# Patient Record
Sex: Female | Born: 1950
Health system: Southern US, Community
[De-identification: ages and names within clinical notes are randomized; demographics above are authoritative.]

## PROBLEM LIST (undated history)

## (undated) DIAGNOSIS — F418 Other specified anxiety disorders: Secondary | ICD-10-CM

## (undated) DIAGNOSIS — I10 Essential (primary) hypertension: Secondary | ICD-10-CM

## (undated) DIAGNOSIS — J449 Chronic obstructive pulmonary disease, unspecified: Secondary | ICD-10-CM

## (undated) DIAGNOSIS — K5792 Diverticulitis of intestine, part unspecified, without perforation or abscess without bleeding: Secondary | ICD-10-CM

## (undated) DIAGNOSIS — I2510 Atherosclerotic heart disease of native coronary artery without angina pectoris: Secondary | ICD-10-CM

## (undated) DIAGNOSIS — I219 Acute myocardial infarction, unspecified: Secondary | ICD-10-CM

## (undated) DIAGNOSIS — Z8679 Personal history of other diseases of the circulatory system: Secondary | ICD-10-CM

## (undated) DIAGNOSIS — K523 Indeterminate colitis: Secondary | ICD-10-CM

## (undated) DIAGNOSIS — K529 Noninfective gastroenteritis and colitis, unspecified: Secondary | ICD-10-CM

## (undated) DIAGNOSIS — F32A Depression, unspecified: Secondary | ICD-10-CM

## (undated) DIAGNOSIS — E785 Hyperlipidemia, unspecified: Secondary | ICD-10-CM

## (undated) DIAGNOSIS — F329 Major depressive disorder, single episode, unspecified: Secondary | ICD-10-CM

## (undated) DIAGNOSIS — I739 Peripheral vascular disease, unspecified: Secondary | ICD-10-CM

## (undated) DIAGNOSIS — E119 Type 2 diabetes mellitus without complications: Secondary | ICD-10-CM

## (undated) HISTORY — DX: Personal history of other diseases of the circulatory system: Z86.79

## (undated) HISTORY — DX: Indeterminate colitis: K52.3

## (undated) HISTORY — DX: Depression, unspecified: F32.A

## (undated) HISTORY — PX: ABDOMINAL HYSTERECTOMY: SHX81

## (undated) HISTORY — DX: Other specified anxiety disorders: F41.8

## (undated) HISTORY — DX: Essential (primary) hypertension: I10

## (undated) HISTORY — DX: Type 2 diabetes mellitus without complications: E11.9

## (undated) HISTORY — DX: Diverticulitis of intestine, part unspecified, without perforation or abscess without bleeding: K57.92

## (undated) HISTORY — DX: Hyperlipidemia, unspecified: E78.5

## (undated) HISTORY — DX: Peripheral vascular disease, unspecified: I73.9

## (undated) HISTORY — DX: Major depressive disorder, single episode, unspecified: F32.9

## (undated) HISTORY — DX: Chronic obstructive pulmonary disease, unspecified: J44.9

## (undated) HISTORY — DX: Noninfective gastroenteritis and colitis, unspecified: K52.9

## (undated) HISTORY — DX: Atherosclerotic heart disease of native coronary artery without angina pectoris: I25.10

## (undated) HISTORY — DX: Acute myocardial infarction, unspecified: I21.9

---

## 1998-04-15 ENCOUNTER — Emergency Department (HOSPITAL_COMMUNITY): Admission: EM | Admit: 1998-04-15 | Discharge: 1998-04-15 | Payer: Self-pay

## 1998-06-16 HISTORY — PX: OTHER SURGICAL HISTORY: SHX169

## 1998-12-31 ENCOUNTER — Inpatient Hospital Stay (HOSPITAL_COMMUNITY): Admission: EM | Admit: 1998-12-31 | Discharge: 1999-01-03 | Payer: Self-pay | Admitting: Emergency Medicine

## 2000-02-05 ENCOUNTER — Encounter: Admission: RE | Admit: 2000-02-05 | Discharge: 2000-02-05 | Payer: Self-pay | Admitting: General Surgery

## 2000-02-05 ENCOUNTER — Encounter: Payer: Self-pay | Admitting: General Surgery

## 2000-02-12 ENCOUNTER — Encounter: Payer: Self-pay | Admitting: General Surgery

## 2000-02-12 ENCOUNTER — Encounter: Admission: RE | Admit: 2000-02-12 | Discharge: 2000-02-12 | Payer: Self-pay | Admitting: General Surgery

## 2001-02-17 ENCOUNTER — Encounter: Admission: RE | Admit: 2001-02-17 | Discharge: 2001-02-17 | Payer: Self-pay | Admitting: General Surgery

## 2001-02-17 ENCOUNTER — Encounter: Payer: Self-pay | Admitting: General Surgery

## 2006-05-20 ENCOUNTER — Ambulatory Visit: Payer: Self-pay | Admitting: Internal Medicine

## 2006-06-16 HISTORY — PX: OTHER SURGICAL HISTORY: SHX169

## 2006-07-01 ENCOUNTER — Ambulatory Visit: Payer: Self-pay | Admitting: Internal Medicine

## 2006-07-01 LAB — CONVERTED CEMR LAB
ALT: 17 units/L (ref 0–40)
Alkaline Phosphatase: 92 units/L (ref 39–117)
BUN: 14 mg/dL (ref 6–23)
Basophils Absolute: 0 10*3/uL (ref 0.0–0.1)
Basophils Relative: 0.5 % (ref 0.0–1.0)
Chloride: 106 meq/L (ref 96–112)
Direct LDL: 92.6 mg/dL
Eosinophils Relative: 4.6 % (ref 0.0–5.0)
GFR calc Af Amer: 84 mL/min
GFR calc non Af Amer: 69 mL/min
MCV: 90.1 fL (ref 78.0–100.0)
Microalb, Ur: 5.7 mg/dL — ABNORMAL HIGH (ref 0.0–1.9)
Platelets: 177 10*3/uL (ref 150–400)
RBC: 5.06 M/uL (ref 3.87–5.11)
RDW: 13.9 % (ref 11.5–14.6)
Total Bilirubin: 1 mg/dL (ref 0.3–1.2)
Total CHOL/HDL Ratio: 5.5
WBC: 7.2 10*3/uL (ref 4.5–10.5)

## 2006-07-08 ENCOUNTER — Ambulatory Visit: Payer: Self-pay | Admitting: Internal Medicine

## 2006-11-06 ENCOUNTER — Ambulatory Visit: Payer: Self-pay | Admitting: Internal Medicine

## 2006-11-06 LAB — CONVERTED CEMR LAB
ALT: 21 units/L (ref 0–40)
AST: 23 units/L (ref 0–37)
Calcium, Total (PTH): 10.2 mg/dL (ref 8.4–10.5)
Cholesterol: 152 mg/dL (ref 0–200)
Hgb A1c MFr Bld: 6.1 % — ABNORMAL HIGH (ref 4.6–6.0)
Total CHOL/HDL Ratio: 6.4
Triglycerides: 226 mg/dL (ref 0–149)
VLDL: 45 mg/dL — ABNORMAL HIGH (ref 0–40)

## 2006-11-13 ENCOUNTER — Ambulatory Visit: Payer: Self-pay | Admitting: Internal Medicine

## 2006-12-03 ENCOUNTER — Encounter: Payer: Self-pay | Admitting: Internal Medicine

## 2006-12-03 DIAGNOSIS — I10 Essential (primary) hypertension: Secondary | ICD-10-CM | POA: Insufficient documentation

## 2006-12-03 DIAGNOSIS — E1169 Type 2 diabetes mellitus with other specified complication: Secondary | ICD-10-CM

## 2006-12-03 DIAGNOSIS — E785 Hyperlipidemia, unspecified: Secondary | ICD-10-CM

## 2006-12-03 DIAGNOSIS — E119 Type 2 diabetes mellitus without complications: Secondary | ICD-10-CM

## 2006-12-03 DIAGNOSIS — I2510 Atherosclerotic heart disease of native coronary artery without angina pectoris: Secondary | ICD-10-CM

## 2006-12-03 DIAGNOSIS — F17200 Nicotine dependence, unspecified, uncomplicated: Secondary | ICD-10-CM | POA: Insufficient documentation

## 2006-12-25 ENCOUNTER — Ambulatory Visit: Payer: Self-pay | Admitting: Internal Medicine

## 2006-12-25 LAB — CONVERTED CEMR LAB
ALT: 35 units/L (ref 0–35)
Chloride: 107 meq/L (ref 96–112)
Cholesterol: 165 mg/dL (ref 0–200)
GFR calc Af Amer: 84 mL/min
Glucose, Bld: 84 mg/dL (ref 70–99)
LDL Cholesterol: 101 mg/dL — ABNORMAL HIGH (ref 0–99)
Potassium: 4.3 meq/L (ref 3.5–5.1)
Sodium: 144 meq/L (ref 135–145)

## 2007-01-03 DIAGNOSIS — I739 Peripheral vascular disease, unspecified: Secondary | ICD-10-CM

## 2007-01-04 ENCOUNTER — Ambulatory Visit: Payer: Self-pay | Admitting: Internal Medicine

## 2007-01-08 ENCOUNTER — Encounter: Payer: Self-pay | Admitting: Internal Medicine

## 2007-01-08 ENCOUNTER — Ambulatory Visit: Payer: Self-pay

## 2007-01-15 ENCOUNTER — Encounter: Payer: Self-pay | Admitting: Internal Medicine

## 2007-02-17 ENCOUNTER — Ambulatory Visit: Payer: Self-pay | Admitting: Vascular Surgery

## 2007-02-26 ENCOUNTER — Ambulatory Visit (HOSPITAL_COMMUNITY): Admission: RE | Admit: 2007-02-26 | Discharge: 2007-02-26 | Payer: Self-pay | Admitting: Vascular Surgery

## 2007-02-26 ENCOUNTER — Ambulatory Visit: Payer: Self-pay | Admitting: Vascular Surgery

## 2007-03-19 ENCOUNTER — Ambulatory Visit: Payer: Self-pay | Admitting: Vascular Surgery

## 2007-03-19 ENCOUNTER — Encounter: Admission: RE | Admit: 2007-03-19 | Discharge: 2007-03-19 | Payer: Self-pay | Admitting: Vascular Surgery

## 2007-03-24 ENCOUNTER — Ambulatory Visit: Payer: Self-pay | Admitting: Internal Medicine

## 2007-03-24 DIAGNOSIS — M810 Age-related osteoporosis without current pathological fracture: Secondary | ICD-10-CM | POA: Insufficient documentation

## 2007-03-24 LAB — CONVERTED CEMR LAB
Cholesterol: 159 mg/dL (ref 0–200)
Total CHOL/HDL Ratio: 6

## 2007-03-31 ENCOUNTER — Ambulatory Visit: Payer: Self-pay | Admitting: Cardiovascular Disease

## 2007-03-31 ENCOUNTER — Ambulatory Visit: Payer: Self-pay | Admitting: Internal Medicine

## 2007-03-31 DIAGNOSIS — E559 Vitamin D deficiency, unspecified: Secondary | ICD-10-CM | POA: Insufficient documentation

## 2007-04-06 ENCOUNTER — Ambulatory Visit: Payer: Self-pay

## 2007-05-03 ENCOUNTER — Inpatient Hospital Stay (HOSPITAL_COMMUNITY): Admission: RE | Admit: 2007-05-03 | Discharge: 2007-05-07 | Payer: Self-pay | Admitting: Vascular Surgery

## 2007-05-03 ENCOUNTER — Ambulatory Visit: Payer: Self-pay | Admitting: Vascular Surgery

## 2007-05-07 ENCOUNTER — Encounter: Payer: Self-pay | Admitting: Internal Medicine

## 2007-05-12 ENCOUNTER — Telehealth: Payer: Self-pay | Admitting: Internal Medicine

## 2007-05-28 ENCOUNTER — Ambulatory Visit: Payer: Self-pay | Admitting: Vascular Surgery

## 2007-05-28 ENCOUNTER — Encounter: Payer: Self-pay | Admitting: Internal Medicine

## 2007-06-02 ENCOUNTER — Ambulatory Visit: Payer: Self-pay | Admitting: Internal Medicine

## 2007-06-06 LAB — CONVERTED CEMR LAB
LDL Cholesterol: 88 mg/dL (ref 0–99)
Triglycerides: 144 mg/dL (ref 0–149)
VLDL: 29 mg/dL (ref 0–40)

## 2007-06-08 ENCOUNTER — Ambulatory Visit: Payer: Self-pay | Admitting: Internal Medicine

## 2007-06-08 LAB — CONVERTED CEMR LAB: Blood Glucose, Fingerstick: 123

## 2007-08-02 ENCOUNTER — Telehealth: Payer: Self-pay | Admitting: Internal Medicine

## 2007-09-09 ENCOUNTER — Ambulatory Visit: Payer: Self-pay | Admitting: Internal Medicine

## 2007-09-12 LAB — CONVERTED CEMR LAB
ALT: 26 units/L (ref 0–35)
AST: 28 units/L (ref 0–37)
Cholesterol: 141 mg/dL (ref 0–200)
Hgb A1c MFr Bld: 5.8 % (ref 4.6–6.0)
LDL Cholesterol: 92 mg/dL (ref 0–99)
Triglycerides: 136 mg/dL (ref 0–149)
VLDL: 27 mg/dL (ref 0–40)

## 2007-09-16 ENCOUNTER — Ambulatory Visit: Payer: Self-pay | Admitting: Internal Medicine

## 2007-10-08 ENCOUNTER — Ambulatory Visit: Payer: Self-pay | Admitting: Vascular Surgery

## 2007-10-08 ENCOUNTER — Telehealth: Payer: Self-pay | Admitting: *Deleted

## 2007-11-16 ENCOUNTER — Ambulatory Visit: Payer: Self-pay | Admitting: Vascular Surgery

## 2007-12-01 ENCOUNTER — Encounter: Payer: Self-pay | Admitting: Internal Medicine

## 2007-12-06 ENCOUNTER — Telehealth: Payer: Self-pay | Admitting: *Deleted

## 2007-12-21 ENCOUNTER — Telehealth: Payer: Self-pay | Admitting: *Deleted

## 2008-01-10 ENCOUNTER — Ambulatory Visit: Payer: Self-pay | Admitting: Internal Medicine

## 2008-01-10 LAB — CONVERTED CEMR LAB
Hgb A1c MFr Bld: 5.8 % (ref 4.6–6.0)
Microalb, Ur: 0.2 mg/dL (ref 0.0–1.9)

## 2008-01-17 ENCOUNTER — Ambulatory Visit: Payer: Self-pay | Admitting: Internal Medicine

## 2008-01-26 ENCOUNTER — Telehealth: Payer: Self-pay | Admitting: Family Medicine

## 2008-01-31 ENCOUNTER — Ambulatory Visit: Payer: Self-pay | Admitting: Internal Medicine

## 2008-01-31 LAB — CONVERTED CEMR LAB
Basophils Absolute: 0.1 10*3/uL (ref 0.0–0.1)
Basophils Relative: 1.8 % (ref 0.0–3.0)
HCT: 39.4 % (ref 36.0–46.0)
Hemoglobin: 13.6 g/dL (ref 12.0–15.0)
Platelets: 205 10*3/uL (ref 150–400)
RDW: 13.6 % (ref 11.5–14.6)
WBC: 7.3 10*3/uL (ref 4.5–10.5)

## 2008-02-04 ENCOUNTER — Telehealth: Payer: Self-pay | Admitting: Internal Medicine

## 2008-02-25 ENCOUNTER — Ambulatory Visit: Payer: Self-pay | Admitting: Internal Medicine

## 2008-03-03 ENCOUNTER — Ambulatory Visit: Payer: Self-pay | Admitting: Internal Medicine

## 2008-03-06 LAB — CONVERTED CEMR LAB
OCCULT 1: NEGATIVE
OCCULT 2: NEGATIVE

## 2008-03-22 ENCOUNTER — Ambulatory Visit: Payer: Self-pay | Admitting: Internal Medicine

## 2008-07-20 ENCOUNTER — Ambulatory Visit: Payer: Self-pay | Admitting: Internal Medicine

## 2008-07-20 LAB — CONVERTED CEMR LAB
ALT: 30 units/L (ref 0–35)
AST: 32 units/L (ref 0–37)
Alkaline Phosphatase: 58 units/L (ref 39–117)
Hgb A1c MFr Bld: 6 % (ref 4.6–6.0)
Total CHOL/HDL Ratio: 6.7
Total Protein: 7.7 g/dL (ref 6.0–8.3)
Triglycerides: 169 mg/dL — ABNORMAL HIGH (ref 0–149)
VLDL: 34 mg/dL (ref 0–40)

## 2008-07-28 ENCOUNTER — Ambulatory Visit: Payer: Self-pay | Admitting: Internal Medicine

## 2008-07-28 DIAGNOSIS — R011 Cardiac murmur, unspecified: Secondary | ICD-10-CM

## 2008-07-28 LAB — CONVERTED CEMR LAB: Hemoglobin: 14.3 g/dL

## 2008-08-07 ENCOUNTER — Encounter: Payer: Self-pay | Admitting: Internal Medicine

## 2008-08-07 ENCOUNTER — Ambulatory Visit: Payer: Self-pay

## 2008-08-23 ENCOUNTER — Ambulatory Visit: Payer: Self-pay | Admitting: Cardiovascular Disease

## 2008-09-06 ENCOUNTER — Ambulatory Visit: Payer: Self-pay

## 2008-09-06 ENCOUNTER — Encounter: Payer: Self-pay | Admitting: Cardiovascular Disease

## 2008-09-12 ENCOUNTER — Telehealth: Payer: Self-pay | Admitting: *Deleted

## 2008-10-02 ENCOUNTER — Telehealth: Payer: Self-pay | Admitting: *Deleted

## 2008-10-12 ENCOUNTER — Telehealth: Payer: Self-pay | Admitting: Internal Medicine

## 2008-11-30 ENCOUNTER — Encounter: Payer: Self-pay | Admitting: Internal Medicine

## 2008-12-07 ENCOUNTER — Telehealth: Payer: Self-pay | Admitting: Internal Medicine

## 2008-12-07 ENCOUNTER — Emergency Department (HOSPITAL_COMMUNITY): Admission: EM | Admit: 2008-12-07 | Discharge: 2008-12-07 | Payer: Self-pay | Admitting: Emergency Medicine

## 2008-12-08 ENCOUNTER — Telehealth: Payer: Self-pay | Admitting: Internal Medicine

## 2008-12-20 ENCOUNTER — Ambulatory Visit: Payer: Self-pay | Admitting: Vascular Surgery

## 2008-12-26 ENCOUNTER — Encounter: Payer: Self-pay | Admitting: *Deleted

## 2009-01-18 ENCOUNTER — Ambulatory Visit: Payer: Self-pay | Admitting: Internal Medicine

## 2009-01-18 LAB — CONVERTED CEMR LAB
Glucose, Bld: 119 mg/dL — ABNORMAL HIGH (ref 70–99)
Potassium: 3.8 meq/L (ref 3.5–5.1)

## 2009-01-30 ENCOUNTER — Ambulatory Visit: Payer: Self-pay | Admitting: Internal Medicine

## 2009-01-30 LAB — CONVERTED CEMR LAB
Free T4: 1 ng/dL (ref 0.6–1.6)
T3, Free: 3.2 pg/mL (ref 2.3–4.2)

## 2009-02-14 LAB — CONVERTED CEMR LAB
Calcium, Total (PTH): 10.7 mg/dL — ABNORMAL HIGH (ref 8.4–10.5)
PTH: 30.2 pg/mL (ref 14.0–72.0)
Thyroperoxidase Ab SerPl-aCnc: 59.9 (ref 0.0–60.0)

## 2009-02-15 ENCOUNTER — Ambulatory Visit: Payer: Self-pay | Admitting: Internal Medicine

## 2009-02-20 ENCOUNTER — Encounter: Payer: Self-pay | Admitting: Internal Medicine

## 2009-02-20 LAB — CONVERTED CEMR LAB: Phosphorus, Ur: 33.5 mg/dL

## 2009-03-02 LAB — CONVERTED CEMR LAB: Calcium Ionized: 1.43 mmol/L — ABNORMAL HIGH (ref 1.12–1.32)

## 2009-03-15 ENCOUNTER — Ambulatory Visit: Payer: Self-pay | Admitting: Endocrinology

## 2009-03-23 ENCOUNTER — Ambulatory Visit: Payer: Self-pay | Admitting: Endocrinology

## 2009-03-23 ENCOUNTER — Ambulatory Visit: Payer: Self-pay | Admitting: Internal Medicine

## 2009-04-25 ENCOUNTER — Ambulatory Visit: Payer: Self-pay | Admitting: Internal Medicine

## 2009-05-25 ENCOUNTER — Ambulatory Visit: Payer: Self-pay | Admitting: Vascular Surgery

## 2009-07-18 ENCOUNTER — Ambulatory Visit: Payer: Self-pay | Admitting: Internal Medicine

## 2009-07-18 LAB — CONVERTED CEMR LAB
ALT: 39 units/L — ABNORMAL HIGH (ref 0–35)
Bilirubin, Direct: 0.1 mg/dL (ref 0.0–0.3)
Cholesterol: 151 mg/dL (ref 0–200)
LDL Cholesterol: 94 mg/dL (ref 0–99)
Total Bilirubin: 0.5 mg/dL (ref 0.3–1.2)
Total CHOL/HDL Ratio: 5
Triglycerides: 125 mg/dL (ref 0.0–149.0)
VLDL: 25 mg/dL (ref 0.0–40.0)

## 2009-07-24 ENCOUNTER — Ambulatory Visit: Payer: Self-pay | Admitting: Internal Medicine

## 2009-07-24 DIAGNOSIS — R7401 Elevation of levels of liver transaminase levels: Secondary | ICD-10-CM | POA: Insufficient documentation

## 2009-07-24 DIAGNOSIS — R740 Nonspecific elevation of levels of transaminase and lactic acid dehydrogenase [LDH]: Secondary | ICD-10-CM

## 2009-07-24 LAB — CONVERTED CEMR LAB
Hep B Core Total Ab: NEGATIVE
Hep B S Ab: NEGATIVE

## 2009-07-31 ENCOUNTER — Telehealth: Payer: Self-pay | Admitting: *Deleted

## 2009-07-31 LAB — CONVERTED CEMR LAB
AST: 29 units/L (ref 0–37)
Albumin: 4.2 g/dL (ref 3.5–5.2)
Alkaline Phosphatase: 66 units/L (ref 39–117)
Bilirubin, Direct: 0.2 mg/dL (ref 0.0–0.3)
Calcium: 10.4 mg/dL (ref 8.4–10.5)
Free T4: 1 ng/dL (ref 0.6–1.6)
Iron: 100 ug/dL (ref 42–145)
Saturation Ratios: 19 % — ABNORMAL LOW (ref 20.0–50.0)
T3, Free: 3 pg/mL (ref 2.3–4.2)
Total Bilirubin: 0.6 mg/dL (ref 0.3–1.2)
Total Protein: 7.8 g/dL (ref 6.0–8.3)

## 2009-08-23 ENCOUNTER — Ambulatory Visit: Payer: Self-pay | Admitting: Internal Medicine

## 2009-08-27 ENCOUNTER — Telehealth: Payer: Self-pay | Admitting: Endocrinology

## 2009-08-27 ENCOUNTER — Encounter: Payer: Self-pay | Admitting: Endocrinology

## 2009-08-27 ENCOUNTER — Ambulatory Visit: Payer: Self-pay | Admitting: Internal Medicine

## 2009-09-10 ENCOUNTER — Ambulatory Visit: Payer: Self-pay | Admitting: Endocrinology

## 2009-10-18 ENCOUNTER — Ambulatory Visit: Payer: Self-pay | Admitting: Internal Medicine

## 2009-10-18 LAB — CONVERTED CEMR LAB: Hgb A1c MFr Bld: 5.9 % (ref 4.6–6.5)

## 2009-10-25 ENCOUNTER — Ambulatory Visit: Payer: Self-pay | Admitting: Internal Medicine

## 2009-12-07 ENCOUNTER — Encounter: Payer: Self-pay | Admitting: Internal Medicine

## 2009-12-11 ENCOUNTER — Encounter: Payer: Self-pay | Admitting: *Deleted

## 2009-12-20 ENCOUNTER — Ambulatory Visit: Payer: Self-pay | Admitting: Vascular Surgery

## 2010-03-05 ENCOUNTER — Ambulatory Visit: Payer: Self-pay | Admitting: Internal Medicine

## 2010-03-05 LAB — CONVERTED CEMR LAB
ALT: 32 units/L (ref 0–35)
Albumin: 4.1 g/dL (ref 3.5–5.2)
Basophils Absolute: 0 10*3/uL (ref 0.0–0.1)
Blood in Urine, dipstick: NEGATIVE
Eosinophils Absolute: 0.3 10*3/uL (ref 0.0–0.7)
Glucose, Urine, Semiquant: NEGATIVE
HCT: 39.8 % (ref 36.0–46.0)
HDL: 27.6 mg/dL — ABNORMAL LOW (ref >39.00)
LDL Cholesterol: 87 mg/dL (ref 0–99)
MCHC: 34.1 g/dL (ref 30.0–36.0)
MCV: 95.1 fL (ref 78.0–100.0)
Microalb Creat Ratio: 0.4 mg/g (ref 0.0–30.0)
Microalb, Ur: 0.1 mg/dL (ref 0.0–1.9)
Monocytes Absolute: 0.4 10*3/uL (ref 0.1–1.0)
Neutro Abs: 3.5 10*3/uL (ref 1.4–7.7)
Neutrophils Relative %: 60 % (ref 43.0–77.0)
Nitrite: NEGATIVE
Platelets: 163 10*3/uL (ref 150.0–400.0)
Potassium: 5.7 meq/L — ABNORMAL HIGH (ref 3.5–5.1)
RDW: 13.8 % (ref 11.5–14.6)
Sodium: 145 meq/L (ref 135–145)
TSH: 2.6 microintl units/mL (ref 0.35–5.50)
Total Bilirubin: 0.6 mg/dL (ref 0.3–1.2)
Total Protein: 6.9 g/dL (ref 6.0–8.3)
Triglycerides: 102 mg/dL (ref 0.0–149.0)
Urobilinogen, UA: 0.2
VLDL: 20.4 mg/dL (ref 0.0–40.0)
WBC: 5.9 10*3/uL (ref 4.5–10.5)

## 2010-03-12 ENCOUNTER — Ambulatory Visit: Payer: Self-pay | Admitting: Internal Medicine

## 2010-03-12 ENCOUNTER — Encounter: Payer: Self-pay | Admitting: Internal Medicine

## 2010-03-12 DIAGNOSIS — E875 Hyperkalemia: Secondary | ICD-10-CM | POA: Insufficient documentation

## 2010-07-02 ENCOUNTER — Ambulatory Visit
Admission: RE | Admit: 2010-07-02 | Discharge: 2010-07-02 | Payer: Self-pay | Source: Home / Self Care | Attending: Internal Medicine | Admitting: Internal Medicine

## 2010-07-02 ENCOUNTER — Other Ambulatory Visit: Payer: Self-pay | Admitting: Internal Medicine

## 2010-07-02 LAB — HEMOGLOBIN A1C: Hgb A1c MFr Bld: 6.4 % (ref 4.6–6.5)

## 2010-07-12 ENCOUNTER — Ambulatory Visit
Admission: RE | Admit: 2010-07-12 | Discharge: 2010-07-12 | Payer: Self-pay | Source: Home / Self Care | Attending: Internal Medicine | Admitting: Internal Medicine

## 2010-07-12 DIAGNOSIS — H409 Unspecified glaucoma: Secondary | ICD-10-CM | POA: Insufficient documentation

## 2010-07-12 LAB — HM DIABETES FOOT EXAM

## 2010-07-18 NOTE — Assessment & Plan Note (Signed)
Summary: follow up/ssc   Vital Signs:  Patient profile:   60 year old female Menstrual status:  hysterectomy Weight:      155 pounds Pulse rate:   72 / minute BP sitting:   120 / 80  (left arm) Cuff size:   regular  Vitals Entered By: Romualdo Bolk, CMA (AAMA) (July 24, 2009 8:22 AM) CC: Follow-up visit on labs- Pt is having problems with loosing temper over things and gets in the mood where she wants to cry alot but doesn't. This has been going on since Mid Nov., Hypertension Management   History of Present Illness: Kimberly [name] comes   for follow up of labs.   No change in health status as far as  CV pulm symptom and dm.  However for the last few months she has had anedoneia just staying at home without energy or wanting to so the things she normally would like such as playing with grandkids taking a walk etc.  She has tried exrecise getting enough sleep and going through the motions but still feels down and  deprssed feeling. No panic and no specific triggers.  No fam hx of depression.  NO fever weight change or med change.  Hypertension History:      She denies headache, chest pain, palpitations, dyspnea with exertion, orthopnea, PND, peripheral edema, visual symptoms, neurologic problems, syncope, and side effects from treatment.  She notes no problems with any antihypertensive medication side effects.        Positive major cardiovascular risk factors include female age 68 years old or older, diabetes, hyperlipidemia, hypertension, and current tobacco user.        Positive history for target organ damage include ASHD (either angina/prior MI/prior CABG) and peripheral vascular disease.  Further assessment for target organ damage reveals no history of stroke/TIA.      Preventive Screening-Counseling & Management  Alcohol-Tobacco     Alcohol drinks/day: 0     Smoking Status: current     Smoking Cessation Counseling: yes     Packs/Day: <0.25  Caffeine-Diet-Exercise  Caffeine use/day: 1     Does Patient Exercise: yes  Current Medications (verified): 1)  Albuterol 90 Mcg/act Aers (Albuterol) 2)  Bayer Aspirin 325 Mg Tabs (Aspirin) .... Take 1 Tablet By Mouth Once A Day 3)  Fenofibrate Micronized 200 Mg Caps (Fenofibrate Micronized) .Marland Kitchen.. 1 By Mouth Once Daily 4)  Lipitor 80 Mg  Tabs (Atorvastatin Calcium) .... Once Daily 5)  Lisinopril 20 Mg Tabs (Lisinopril) .... Take 1 Tablet By Mouth Once A Day 6)  Toprol Xl 50 Mg Tb24 (Metoprolol Succinate) .... Take 1 Tablet By Mouth Once A Day 7)  Fish Oil   Oil (Fish Oil) 8)  Vitamin D 400 Unit  Tabs (Cholecalciferol)  Allergies (verified): No Known Drug Allergies  Past History:  Past medical, surgical, family and social histories (including risk factors) reviewed, and no changes noted (except as noted below).  Past Medical History: Reviewed history from 01/30/2009 and no changes required. Coronary artery disease Diabetes mellitus, type II Hyperlipidemia Hypertension PVD stress test? 2008  pre bypass surgery. ED visit June 2010  rectal bleeding felt to be hemorrhoid    Echo  Consults Dr. Arbie Cookey Dr. Eden Emms  Past Surgical History: Reviewed history from 01/31/2008 and no changes required. cabg Hysterectomy endometriosis aortofemoral bypass grafting November 2008  Past History:  Care Management: Cardiology: Eden Emms Vascular Surgery: Early Endocrinology: Everardo All  Family History: Reviewed history from 03/15/2009 and no changes required. Family  History Diabetes 1st degree relative Family History Hypertension CAD Thyroid  sisters sister had parathyroidectomy Brother died alcoholic cirrhosis     Social History: Reviewed history from 01/30/2009 and no changes required. Married Current Smoker    9 per day  Alcohol use-no Regular exercise-yes Household of 2 see data base      Review of Systems       The patient complains of depression.  The patient denies anorexia, fever, weight loss,  weight gain, vision loss, decreased hearing, hoarseness, syncope, dyspnea on exertion, peripheral edema, prolonged cough, headaches, abdominal pain, melena, hematochezia, severe indigestion/heartburn, hematuria, muscle weakness, difficulty walking, unusual weight change, enlarged lymph nodes, angioedema, and breast masses.         no suicidal thoughts .  Physical Exam  General:  Well-developed,well-nourished,in no acute distress; alert,appropriate and cooperative throughout examination Head:  normocephalic and atraumatic.   Eyes:  vision grossly intact, pupils equal, and pupils round.   Neck:  palpable thyroid no nodeules  Lungs:  Normal respiratory effort, chest expands symmetrically. Lungs are clear to auscultation, no crackles or wheezes. Heart:  Normal rate and regular rhythm. S1 and S2 normal without gallop, murmur, click, rub or other extra sounds. Abdomen:  Bowel sounds positive,abdomen soft and non-tender without masses, organomegaly or   noted. Extremities:  no clubbing cyanosis or edema  Neurologic:  alert & oriented X3, strength normal in all extremities, sensation intact to light touch, and gait normal.   Skin:  turgor normal, color normal, no ecchymoses, and no petechiae.   Cervical Nodes:  No lymphadenopathy noted Psych:  Oriented X3, good eye contact, not anxious appearing, not depressed appearing, and not agitated.     Impression & Recommendations:  Problem # 1:  TRANSAMINASES, SERUM, ELEVATED (ICD-790.4) Assessment New neg fam hx except for bro died of alcoholic cirrhosis   need follow up testing and monitoring. Orders: TLB-BMP (Basic Metabolic Panel-BMET) (80048-METABOL) TLB-Hepatic/Liver Function Pnl (80076-HEPATIC) TLB-TSH (Thyroid Stimulating Hormone) (84443-TSH) TLB-T4 (Thyrox), Free [id number]) TLB-T3, Free (Triiodothyronine) (84481-T3FREE) T-Hepatitis C Antibody (46962-95284) T-Hepatitis B Surface Antibody (13244-01027) T-Hepatitis B Surface Antigen  (25366-44034) T-Hepatitis B Core Antibody (74259-56387) T-Ceruloplasmin (56433-29518) T-Antinuclear Antib (ANA) (84166-06301) TLB-IBC Pnl (Iron/FE;Transferrin) (83550-IBC)  Problem # 2:  ? of ADJUSTMENT DISORDER WITH DEPRESSED MOOD (ICD-309.0) Assessment: New seems like mild depression but never had this before and no inciting factor or lifestyle factor.    never had   seasonal changes in mood before.  and no fam hx .  r/o metabolic causes and then follow up   consider low dose ssri and follow up aftet that if labs unrevealing . disc options of rx   doesnt want to  do souncseling at this point.     Orders: TLB-BMP (Basic Metabolic Panel-BMET) (80048-METABOL) TLB-Hepatic/Liver Function Pnl (80076-HEPATIC) TLB-TSH (Thyroid Stimulating Hormone) (84443-TSH) TLB-T4 (Thyrox), Free [phone] TLB-T3, Free (Triiodothyronine) (84481-T3FREE) T-Hepatitis C Antibody (57322-02542) T-Hepatitis B Surface Antibody (70623-76283) T-Hepatitis B Surface Antigen (15176-16073) T-Hepatitis B Core Antibody (71062-69485) T-Ceruloplasmin (46270-35009) T-Antinuclear Antib (ANA) (38182-99371) TLB-IBC Pnl (Iron/FE;Transferrin) (83550-IBC)  Problem # 3:  DIABETES MELLITUS, TYPE II (ICD-250.00) due for updated pneumovax Her updated medication list for this problem includes:    Bayer Aspirin 325 Mg Tabs (Aspirin) .Marland Kitchen... Take 1 tablet by mouth once a day    Lisinopril 20 Mg Tabs (Lisinopril) .Marland Kitchen... Take 1 tablet by mouth once a day  Labs Reviewed: Creat: 0.9 (01/18/2009)     Last Eye Exam: normal (11/30/2008) Reviewed HgBA1c results: 6.3 (07/18/2009)  5.9 (01/18/2009)  Problem # 4:  HYPERTENSION (ICD-401.9) Assessment: Unchanged  Her updated medication list for this problem includes:    Lisinopril 20 Mg Tabs (Lisinopril) .Marland Kitchen... Take 1 tablet by mouth once a day    Toprol Xl 50 Mg Tb24 (Metoprolol succinate) .Marland Kitchen... Take 1 tablet by mouth once a day  BP today: 120/80 Prior BP: 128/70 (03/15/2009)  Prior 10  Yr Risk Heart Disease: N/A (01/04/2007)  Labs Reviewed: K+: 3.8 (01/18/2009) Creat: : 0.9 (01/18/2009)   Chol: 151 (07/18/2009)   HDL: 31.60 (07/18/2009)   LDL: 94 (07/18/2009)   TG: 125.0 (07/18/2009)  Problem # 5:  CORONARY ARTERY DISEASE (ICD-414.00) Assessment: Unchanged no signs  Her updated medication list for this problem includes:    Bayer Aspirin 325 Mg Tabs (Aspirin) .Marland Kitchen... Take 1 tablet by mouth once a day    Lisinopril 20 Mg Tabs (Lisinopril) .Marland Kitchen... Take 1 tablet by mouth once a day    Toprol Xl 50 Mg Tb24 (Metoprolol succinate) .Marland Kitchen... Take 1 tablet by mouth once a day  Problem # 6:  HYPERCALCEMIA (ICD-275.42) under evaluation ? cause   24 hour urine pending  .  seeing Dr Everardo All.  Problem # 7:  TOBACCO USER (ICD-305.1) Assessment: Unchanged no increase  9 per day.  Complete Medication List: 1)  Albuterol 90 Mcg/act Aers (Albuterol) 2)  Bayer Aspirin 325 Mg Tabs (Aspirin) .... Take 1 tablet by mouth once a day 3)  Fenofibrate Micronized 200 Mg Caps (Fenofibrate micronized) .Marland Kitchen.. 1 by mouth once daily 4)  Lipitor 80 Mg Tabs (Atorvastatin calcium) .... Once daily 5)  Lisinopril 20 Mg Tabs (Lisinopril) .... Take 1 tablet by mouth once a day 6)  Toprol Xl 50 Mg Tb24 (Metoprolol succinate) .... Take 1 tablet by mouth once a day 7)  Fish Oil Oil (Fish oil) 8)  Vitamin D 400 Unit Tabs (Cholecalciferol)  Other Orders: Pneumococcal Vaccine (95621) Admin 1st Vaccine (30865)  Hypertension Assessment/Plan:      The patient's hypertensive risk group is category C: Target organ damage and/or diabetes.  Today's blood pressure is 120/80.  Her blood pressure goal is < 130/80.  Patient Instructions: 1)  You will be informed of lab results when available.  2)  if ok we will call in medication as  discussed and plan follow up .   Immunizations Administered:  Pneumonia Vaccine:    Vaccine Type: Pneumovax    Site: left deltoid    Mfr: Merck    Dose: 0.5 ml    Route: IM    Given  by: Romualdo Bolk, CMA (AAMA)    Exp. Date: 10/04/2010    Lot #: [id number]

## 2010-07-18 NOTE — Miscellaneous (Signed)
Summary: eye exam done by Dr. Dione Booze   Clinical Lists Changes  Observations: Added new observation of DIAB EYE EX: No diabetic retinopathy.    (12/07/2009 12:47)      Diabetic Eye Exam  Procedure date:  12/07/2009  Findings:      No diabetic retinopathy.

## 2010-07-18 NOTE — Letter (Signed)
Summary: Diabetic Eye Exam/[facility]  Diabetic Eye Exam/[facility]   Imported By: Maryln Gottron 12/14/2009 12:42:33  _____________________________________________________________________  External Attachment:    Type:   Image     Comment:   External Document

## 2010-07-18 NOTE — Assessment & Plan Note (Signed)
Summary: CPX // RS   Vital Signs:  Patient profile:   60 year old female Menstrual status:  hysterectomy Height:      63.75 inches Weight:      147 pounds BMI:     25.52 Pulse rate:   66 / minute BP sitting:   110 / 70  (left arm) Cuff size:   regular  Vitals Entered By: Romualdo Bolk, CMA Duncan Dull) (March 12, 2010 9:17 AM) CC: CPX   History of Present Illness: Kimberly [name] comes in today  for preventive visit and multiple medical problems . Since last visit  here  there have been no major changes in health status  .  Mood : doing well and wants to wean celexa .   beginning to exercise more with school startin  CAD: no change  cp sob or edema PVD: no leg signs  DM: controlled no numbness or infections or eye changes  utd on eye  check. PC : declines mammo and colon cause of cost  no signs.  HT: no se of meds.   no  cough no potass supp. TOBacco: about 9 per day . denies sob wheezing   Preventive Care Screening  Prior Values:    Mammogram:  Done (06/16/1998)    Last Tetanus Booster:  Tdap (01/30/2009)    Last Flu Shot:  Fluvax 3+ (04/25/2009)    Last Pneumovax:  Pneumovax (07/24/2009)   Contraindications/Deferment of Procedures/Staging:    Test/Procedure: Colonoscopy    Reason for deferment: declined-financial     Test/Procedure: Mammogram    Reason for deferment: declined-financial     Test/Procedure: PAP Smear    Reason for deferment: hysterectomy   Preventive Screening-Counseling & Management  Alcohol-Tobacco     Alcohol drinks/day: 0     Smoking Status: current     Smoking Cessation Counseling: yes     Packs/Day: <0.25     Tobacco Counseling: to quit use of tobacco products  Caffeine-Diet-Exercise     Caffeine use/day: 1     Does Patient Exercise: yes  Hep-HIV-STD-Contraception     Dental Care Counseling: not indicated; dental care within six months     Sun Exposure-Excessive: no  Safety-Violence-Falls     Seat Belt Use: yes     Firearms in  the Home: firearms in the home     Firearm Counseling: not indicated; uses recommended firearm safety measures     Smoke Detectors: yes  Comments: has  false teeth      Blood Transfusions:  no.    Current Medications (verified): 1)  Bayer Aspirin 325 Mg Tabs (Aspirin) .... Take 1 Tablet By Mouth Once A Day 2)  Fenofibrate Micronized 200 Mg Caps (Fenofibrate Micronized) .Marland Kitchen.. 1 By Mouth Once Daily 3)  Lipitor 80 Mg  Tabs (Atorvastatin Calcium) .... Once Daily 4)  Lisinopril 20 Mg Tabs (Lisinopril) .... Take 1 Tablet By Mouth Once A Day 5)  Toprol Xl 50 Mg Tb24 (Metoprolol Succinate) .... Take 1 Tablet By Mouth Once A Day 6)  Fish Oil   Oil (Fish Oil) 7)  Vitamin D 400 Unit  Tabs (Cholecalciferol) 8)  Citalopram Hydrobromide 20 Mg Tabs (Citalopram Hydrobromide) .Marland Kitchen.. 1 By Mouth Once Daily 9)  Multivitamins   Tabs (Multiple Vitamin) 10)  Ventolin Hfa 108 (90 Base) Mcg/act Aers (Albuterol Sulfate) .Marland Kitchen.. 1-2 Puffs  Qid As Needed Wheezing 11)  Alprazolam 0.25 Mg Tabs (Alprazolam) .Marland Kitchen.. 1 By Mouth Up To  Three Times A Day As Needed  For  Anxiety  Allergies (verified): No Known Drug Allergies  Past History:  Past Medical History: Coronary artery disease Diabetes mellitus, type II Hyperlipidemia Hypertension PVD stress test? 2008  pre  iliac bypass surgery. ED visit June 2010  rectal bleeding felt to be hemorrhoid    Echo  inc rv  pos from copd neg bubble study for shunt Hypercalcemia evaluation  no dx  per Dr Everardo All  Dentures  Consults Dr. Arbie Cookey Dr. Eden Emms  Past History:  Care Management: Cardiology: Eden Emms Vascular Surgery: Early Endocrinology: Everardo All Opth : [facility]  Social History: Married Current Smoker    9 per day  Alcohol use-no Regular exercise-yes Household of 2 see data base     Caring for  [employer]  helping  after school   no pets    Seat Belt Use:  yes Sun Exposure-Excessive:  no Blood Transfusions:  no  Review of Systems  The patient denies anorexia,  fever, weight loss, vision loss, decreased hearing, hoarseness, chest pain, syncope, dyspnea on exertion, peripheral edema, prolonged cough, headaches, hemoptysis, abdominal pain, melena, hematochezia, severe indigestion/heartburn, hematuria, muscle weakness, transient blindness, difficulty walking, depression, unusual weight change, abnormal bleeding, enlarged lymph nodes, angioedema, and breast masses.         eye  check  ok ,   Physical Exam  General:  Well-developed,well-nourished,in no acute distress; alert,appropriate and cooperative throughout examination Head:  normocephalic and atraumatic.   Eyes:  vision grossly intact, pupils equal, and pupils round.   Ears:  R ear normal and L ear normal.  no external deformities.   Nose:  no external deformity and no nasal discharge.   Mouth:  pharynx pink and moist.  dentures  Neck:  No deformities, masses, or tenderness noted. no bruits  Chest Hersh:  No deformities, masses, or tenderness noted. Breasts:  No mass, nodules, thickening, tenderness, bulging, retraction, inflamation, nipple discharge or skin changes noted.   Lungs:  Normal respiratory effort, chest expands symmetrically. Lungs are clear to auscultation, no crackles or wheezes.no dullness.   Heart:  normal rate, regular rhythm, no gallop, no rub, no JVD, and no lifts.  systolic murmur left usb  2/6 less with supine  no clicks   Abdomen:  Bowel sounds positive,abdomen soft and non-tender without masses, organomegaly or hernias noted. no bruits  Msk:  no joint swelling and no joint warmth.   Pulses:  nl cap refill  pulses intact without delay   Extremities:  no clubbing cyanosis or edema  some loss of hair on feeet  no ulcers  Neurologic:  alert & oriented X3, strength normal in all extremities, and gait normal.   Skin:  turgor normal, color normal, no ecchymoses, and no petechiae.   Cervical Nodes:  No lymphadenopathy noted Axillary Nodes:  No palpable lymphadenopathy Inguinal Nodes:   No significant adenopathy Psych:  Oriented X3, memory intact for recent and remote, normally interactive, good eye contact, not anxious appearing, and not depressed appearing.    Diabetes Management Exam:    Foot Exam (with socks and/or shoes not present):       Sensory-Monofilament:          Left foot: normal          Right foot: normal       Inspection:          Left foot: normal          Right foot: normal       Nails:  Left foot: normal          Right foot: normal   Impression & Recommendations:  Problem # 1:  Preventive Health Care (ICD-V70.0)  declines mammo and colon for financial reasons and stool cards today . continue  healrhy lifestyle and stop tobacco  Orders: EKG w/ Interpretation (93000)  Problem # 2:  CORONARY ARTERY DISEASE (ICD-414.00) Assessment: Unchanged  Her updated medication list for this problem includes:    Bayer Aspirin 325 Mg Tabs (Aspirin) .Marland Kitchen... Take 1 tablet by mouth once a day    Lisinopril 20 Mg Tabs (Lisinopril) .Marland Kitchen... Take 1 tablet by mouth once a day    Toprol Xl 50 Mg Tb24 (Metoprolol succinate) .Marland Kitchen... Take 1 tablet by mouth once a day  Orders: EKG w/ Interpretation (93000)  Problem # 3:  DIABETES MELLITUS, TYPE II (ICD-250.00)  good control no complications otherwise at present Her updated medication list for this problem includes:    Bayer Aspirin 325 Mg Tabs (Aspirin) .Marland Kitchen... Take 1 tablet by mouth once a day    Lisinopril 20 Mg Tabs (Lisinopril) .Marland Kitchen... Take 1 tablet by mouth once a day  Labs Reviewed: Creat: 0.9 (03/05/2010)     Last Eye Exam: No diabetic retinopathy.    (12/07/2009) Reviewed HgBA1c results: 6.0 (03/05/2010)  5.9 (10/18/2009)  Problem # 4:  PVD WITH CLAUDICATION AOFEM BYPASS 11/08 (ICD-443.89) Assessment: Unchanged currently no signs   no exercise intolerance . sees vascular yearly   Problem # 5:  HYPERTENSION (ICD-401.9) Assessment: Unchanged  Her updated medication list for this problem includes:     Lisinopril 20 Mg Tabs (Lisinopril) .Marland Kitchen... Take 1 tablet by mouth once a day    Toprol Xl 50 Mg Tb24 (Metoprolol succinate) .Marland Kitchen... Take 1 tablet by mouth once a day  Orders: EKG w/ Interpretation (93000)  BP today: 110/70 Prior BP: 120/80 (10/25/2009)  Prior 10 Yr Risk Heart Disease: N/A (01/04/2007)  Labs Reviewed: K+: 5.7 (03/05/2010) Creat: : 0.9 (03/05/2010)   Chol: 135 (03/05/2010)   HDL: 27.60 (03/05/2010)   LDL: 87 (03/05/2010)   TG: 102.0 (03/05/2010)  Problem # 6:  HYPERLIPIDEMIA (ICD-272.4) ld  close to goal     plans increase exercise  Her updated medication list for this problem includes:    Fenofibrate Micronized 200 Mg Caps (Fenofibrate micronized) .Marland Kitchen... 1 by mouth once daily    Lipitor 80 Mg Tabs (Atorvastatin calcium) ..... Once daily  Labs Reviewed: SGOT: 32 (03/05/2010)   SGPT: 32 (03/05/2010)  Lipid Goals: Chol Goal: 200 (09/16/2007)   HDL Goal: 40 (09/16/2007)   LDL Goal: 70 (09/16/2007)   TG Goal: 150 (09/16/2007)  Prior 10 Yr Risk Heart Disease: N/A (01/04/2007)   HDL:27.60 (03/05/2010), 31.60 (07/18/2009)  LDL:87 (03/05/2010), 94 (07/18/2009)  Chol:135 (03/05/2010), 151 (07/18/2009)  Trig:102.0 (03/05/2010), 125.0 (07/18/2009)  Problem # 7:  ADJ DISORDER WITH MIXED ANXIETY & DEPRESSED MOOD (ICD-309.28) Assessment: Improved disc    weaning   med doing myuch better and on med at least 6 months  Problem # 8:  SYSTOLIC MURMUR (EAV-409.8) see eval in 2010   . Echo showed   rv dilitation n poss from  copd   and no obv shunting  by bubble study.  no signs  supposed to follow up with cardiology yearly  and they should notify her .  no symptoms     Problem # 9:  HYPERPOTASSEMIA (ICD-276.7) poss lab draw effect.  but is on an ace I   will check today without a  tournequet  and then decide follow up  Orders: TLB-Potassium (K+) poss lab effect   check to day and then make decision  ekg nl and no signs of  hyperkalemia.   Problem # 10:  TOBACCO USER (ICD-305.1) disc  importance of cessation  with / of  past echo findings   consider repeating pfts .  will have to check when last ones done.   Complete Medication List: 1)  Bayer Aspirin 325 Mg Tabs (Aspirin) .... Take 1 tablet by mouth once a day 2)  Fenofibrate Micronized 200 Mg Caps (Fenofibrate micronized) .Marland Kitchen.. 1 by mouth once daily 3)  Lipitor 80 Mg Tabs (Atorvastatin calcium) .... Once daily 4)  Lisinopril 20 Mg Tabs (Lisinopril) .... Take 1 tablet by mouth once a day 5)  Toprol Xl 50 Mg Tb24 (Metoprolol succinate) .... Take 1 tablet by mouth once a day 6)  Fish Oil Oil (Fish oil) 7)  Vitamin D 400 Unit Tabs (Cholecalciferol) 8)  Citalopram Hydrobromide 20 Mg Tabs (Citalopram hydrobromide) .Marland Kitchen.. 1 by mouth once daily 9)  Multivitamins Tabs (Multiple vitamin) 10)  Ventolin Hfa 108 (90 Base) Mcg/act Aers (Albuterol sulfate) .Marland Kitchen.. 1-2 puffs  qid as needed wheezing 11)  Alprazolam 0.25 Mg Tabs (Alprazolam) .Marland Kitchen.. 1 by mouth up to  three times a day as needed  for anxiety  Other Orders: Admin 1st Vaccine (04540) Flu Vaccine 67yrs + (98119) TLB-Potassium (K+) (84132-K) Specimen Handling (14782) Venipuncture (95621)  Patient Instructions: 1)  can wean the celexa    10 mg every day for 1-2 weeksthen  2)  alternate 10 mg with none every other day for 1-2 weeks then try stopping.   3)  If having a problem call and we can order 10 mg  celexa and go down to 5 mg  dosing if needed. 4)  Stop smoking as discussed. 5)  repeat potassium test.  without a tourniquet  today  6)  follow up with Dr Eden Emms about  heart murmur when due. 7)  stool cards  declined  8)  i will review record about lung function follow up .  9)  hg a1c before visit in 4 months  . 10)  Please schedule a follow-up appointment in 4 months .  11)  HgBA1c prior to visit  ICD-9:  Flu Vaccine Consent Questions     Do you have a history of severe allergic reactions to this vaccine? no    Any prior history of allergic reactions to egg and/or gelatin?  no    Do you have a sensitivity to the preservative Thimersol? no    Do you have a past history of Guillan-Barre Syndrome? no    Do you currently have an acute febrile illness? no    Have you ever had a severe reaction to latex? no    Vaccine information given and explained to patient? yes    Are you currently pregnant? no    Lot Number:[id number]   Exp Date:12/14/2010   Site Given  Left Deltoid IMlu Romualdo Bolk, CMA (AAMA)  March 12, 2010 9:21 AM

## 2010-07-18 NOTE — Assessment & Plan Note (Signed)
Summary: 2 MONTH FUP//CCM   Vital Signs:  Patient profile:   60 year old female Menstrual status:  hysterectomy Weight:      149 pounds Pulse rate:   60 / minute BP sitting:   120 / 80  (left arm) Cuff size:   regular  Vitals Entered By: Romualdo Bolk, CMA (AAMA) (Oct 25, 2009 9:37 AM) CC: Follow-up visit on labs, Hypertension Management, Discuss going on stress and anxiety- Mother in law getting on her nerves   History of Present Illness: Kimberly [name]  comes  in for follow up of multiple medical problems   Mood:   anxiety at times.  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  NO sig sob . exercising more to help with stress  PVD: better  after intervention  and no restrictions DM: no lows  no vision or infection or feet changes  to get eye exam in June Tobacco no change LIPIDS: no se of meds    Hypertension History:      She denies headache, chest pain, palpitations, dyspnea with exertion, orthopnea, PND, peripheral edema, visual symptoms, neurologic problems, syncope, and side effects from treatment.  She notes no problems with any antihypertensive medication side effects.        Positive major cardiovascular risk factors include female age 40 years old or older, diabetes, hyperlipidemia, hypertension, and current tobacco user.        Positive history for target organ damage include ASHD (either angina/prior MI/prior CABG) and peripheral vascular disease.  Further assessment for target organ damage reveals no history of stroke/TIA.      Preventive Screening-Counseling & Management  Alcohol-Tobacco     Alcohol drinks/day: 0     Smoking Status: current     Smoking Cessation Counseling: yes     Packs/Day: <0.25  Caffeine-Diet-Exercise     Caffeine use/day: 1     Does Patient Exercise: yes  Current Medications (verified): 1)  Proair Hfa 108 (90 Base) Mcg/act Aers  (Albuterol Sulfate) .... Inhale 2 Puffs Q 6 Hours As Needed Wheezing. 2)  Bayer Aspirin 325 Mg Tabs (Aspirin) .... Take 1 Tablet By Mouth Once A Day 3)  Fenofibrate Micronized 200 Mg Caps (Fenofibrate Micronized) .Marland Kitchen.. 1 By Mouth Once Daily 4)  Lipitor 80 Mg  Tabs (Atorvastatin Calcium) .... Once Daily 5)  Lisinopril 20 Mg Tabs (Lisinopril) .... Take 1 Tablet By Mouth Once A Day 6)  Toprol Xl 50 Mg Tb24 (Metoprolol Succinate) .... Take 1 Tablet By Mouth Once A Day 7)  Fish Oil   Oil (Fish Oil) 8)  Vitamin D 400 Unit  Tabs (Cholecalciferol) 9)  Citalopram Hydrobromide 20 Mg Tabs (Citalopram Hydrobromide) .Marland Kitchen.. 1 By Mouth Once Daily 10)  Multivitamins   Tabs (Multiple Vitamin)  Allergies (verified): No Known Drug Allergies  Past History:  Past medical, surgical, family and social histories (including risk factors) reviewed, and no changes noted (except as noted below).  Past Medical History: Coronary artery disease Diabetes mellitus, type II Hyperlipidemia Hypertension PVD stress test? 2008  pre  iliac bypass surgery. ED visit June 2010  rectal bleeding felt to be hemorrhoid    Echo  Hypercalcemia evaluation  no dx  per Dr Everardo All  Consults Dr. Arbie Cookey Dr. Eden Emms  Past Surgical History: Reviewed history from 01/31/2008 and no changes required. cabg Hysterectomy endometriosis aortofemoral bypass  grafting November 2008  Past History:  Care Management: Cardiology: Eden Emms Vascular Surgery: Early Endocrinology: Everardo All  Family History: Reviewed history from 07/24/2009 and no changes required. Family History Diabetes 1st degree relative Family History Hypertension CAD Thyroid  sisters sister had parathyroidectomy Brother died alcoholic cirrhosis     Social History: Reviewed history from 01/30/2009 and no changes required. Married Current Smoker    9 per day  Alcohol use-no Regular exercise-yes Household of 2 see data base     Caring for  [employer]   Review of  Systems  The patient denies anorexia, fever, weight loss, weight gain, vision loss, decreased hearing, hoarseness, chest pain, syncope, dyspnea on exertion, peripheral edema, prolonged cough, abdominal pain, melena, hematochezia, severe indigestion/heartburn, hematuria, muscle weakness, difficulty walking, unusual weight change, abnormal bleeding, enlarged lymph nodes, and angioedema.    Physical Exam  General:  Well-developed,well-nourished,in no acute distress; alert,appropriate and cooperative throughout examination Head:  normocephalic and atraumatic.   Eyes:  vision grossly intact, pupils equal, and pupils round.   Neck:  No deformities, masses, or tenderness noted. Lungs:  Normal respiratory effort, chest expands symmetrically. Lungs are clear to auscultation, no crackles or wheezes. Heart:  Normal rate and regular rhythm. S1 and S2 normal without gallop, murmur, click, rub or other extra sounds.  short sem lusb  Abdomen:  Bowel sounds positive,abdomen soft and non-tender without masses, organomegaly or   noted. Msk:  no joint warmth and no redness over joints.   Pulses:  nl cap refill  Extremities:  no clubbing cyanosis or edema  Neurologic:  non focal grosly  Psych:  Oriented X3, normally interactive, good eye contact, and not anxious appearing.    much brighter  .     Impression & Recommendations:  Problem # 1:  DIABETES MELLITUS, TYPE II (ICD-250.00) Assessment Improved  Her updated medication list for this problem includes:    Bayer Aspirin 325 Mg Tabs (Aspirin) .Marland Kitchen... Take 1 tablet by mouth once a day    Lisinopril 20 Mg Tabs (Lisinopril) .Marland Kitchen... Take 1 tablet by mouth once a day  Labs Reviewed: Creat: 0.8 (07/24/2009)     Last Eye Exam: normal (11/30/2008) Reviewed HgBA1c results: 5.9 (10/18/2009)  6.3 (07/18/2009)  Problem # 2:  ADJ DISORDER WITH MIXED ANXIETY & DEPRESSED MOOD (ICD-309.28) depression better  anxiety break through with  responsibility  of caretakers  stress;    agree with  safe strategies   to compensate   Problem # 3:  PERIPHERAL VASCULAR DISEASE (ICD-443.9) Assessment: Unchanged  Problem # 4:  HYPERTENSION (ICD-401.9) Assessment: Unchanged  Her updated medication list for this problem includes:    Lisinopril 20 Mg Tabs (Lisinopril) .Marland Kitchen... Take 1 tablet by mouth once a day    Toprol Xl 50 Mg Tb24 (Metoprolol succinate) .Marland Kitchen... Take 1 tablet by mouth once a day  Problem # 5:  CORONARY ARTERY DISEASE (ICD-414.00) Assessment: Unchanged  Her updated medication list for this problem includes:    Bayer Aspirin 325 Mg Tabs (Aspirin) .Marland Kitchen... Take 1 tablet by mouth once a day    Lisinopril 20 Mg Tabs (Lisinopril) .Marland Kitchen... Take 1 tablet by mouth once a day    Toprol Xl 50 Mg Tb24 (Metoprolol succinate) .Marland Kitchen... Take 1 tablet by mouth once a day  Problem # 6:  TOBACCO USER (ICD-305.1) aware should dc  Complete Medication List: 1)  Proair Hfa 108 (90 Base) Mcg/act Aers (Albuterol sulfate) .... Inhale 2 puffs q 6 hours as needed wheezing. 2)  [employer]  Aspirin 325 Mg Tabs (Aspirin) .... Take 1 tablet by mouth once a day 3)  Fenofibrate Micronized 200 Mg Caps (Fenofibrate micronized) .Marland Kitchen.. 1 by mouth once daily 4)  Lipitor 80 Mg Tabs (Atorvastatin calcium) .... Once daily 5)  Lisinopril 20 Mg Tabs (Lisinopril) .... Take 1 tablet by mouth once a day 6)  Toprol Xl 50 Mg Tb24 (Metoprolol succinate) .... Take 1 tablet by mouth once a day 7)  Fish Oil Oil (Fish oil) 8)  Vitamin D 400 Unit Tabs (Cholecalciferol) 9)  Citalopram Hydrobromide 20 Mg Tabs (Citalopram hydrobromide) .Marland Kitchen.. 1 by mouth once daily 10)  Multivitamins Tabs (Multiple vitamin) 11)  Ventolin Hfa 108 (90 Base) Mcg/act Aers (Albuterol sulfate) .Marland Kitchen.. 1-2 puffs  qid as needed wheezing 12)  Alprazolam 0.25 Mg Tabs (Alprazolam) .Marland Kitchen.. 1 by mouth up to  three times a day as needed  for anxiety  Hypertension Assessment/Plan:      The patient's hypertensive risk group is category C: Target organ damage  and/or diabetes.  Today's blood pressure is 120/80.  Her blood pressure goal is < 130/80.  Patient Instructions: 1)  continue the citalopram 2)  add xanax as needed for anxiety  call if needed. 3)  schedule   September cpx with labs  plus   hg a1c and urine microalbumin creatinine ratio   dx 250.0  Prescriptions: ALPRAZOLAM 0.25 MG TABS (ALPRAZOLAM) 1 by mouth up to  three times a day as needed  for anxiety  #30 x 0   Entered and Authorized by:   Madelin Headings MD   Signed by:   Madelin Headings MD on 10/25/2009   Method used:   Print then Give to Patient   RxID:   [id number] VENTOLIN HFA 108 (90 BASE) MCG/ACT AERS (ALBUTEROL SULFATE) 1-2 puffs  qid as needed wheezing  #1 x 1   Entered and Authorized by:   Madelin Headings MD   Signed by:   Madelin Headings MD on 10/25/2009   Method used:   Print then Give to Patient   RxID:   [id number]   Prevention & Chronic Care Immunizations   Influenza vaccine: Fluvax 3+  (04/25/2009)    Tetanus booster: 01/30/2009: Tdap    Pneumococcal vaccine: Pneumovax  (07/24/2009)  Colorectal Screening   Hemoccult: Not documented    Colonoscopy: Not documented   Colonoscopy action/deferral: declined-financial  (01/30/2009)  Other Screening   Pap smear: Not documented    Mammogram: Done  (06/16/1998)   Smoking status: current  (10/25/2009)   Smoking cessation counseling: yes  (10/25/2009)  Diabetes Mellitus   HgbA1C: 5.9  (10/18/2009)    Eye exam: normal  (11/30/2008)    Foot exam: yes  (01/30/2009)   High risk foot: Not documented   Foot care education: Not documented    Urine microalbumin/creatinine ratio: 4.2  (01/18/2009)    Diabetes flowsheet reviewed?: Yes   Progress toward A1C goal: At goal  Lipids   Total Cholesterol: 151  (07/18/2009)   LDL: 94  (07/18/2009)   LDL Direct: 95.5  (11/06/2006)   HDL: 31.60  (07/18/2009)   Triglycerides: 125.0  (07/18/2009)    SGOT (AST): 29  (07/24/2009)   SGPT (ALT): 31   (07/24/2009)   Alkaline phosphatase: 66  (07/24/2009)   Total bilirubin: 0.6  (07/24/2009)  Hypertension   Last Blood Pressure: 120 / 80  (10/25/2009)   Serum creatinine: 0.8  (07/24/2009)   Serum potassium 3.9  (07/24/2009)  Self-Management Support :  Diabetes self-management support: Not documented    Hypertension self-management support: Not documented    Lipid self-management support: Not documented

## 2010-07-18 NOTE — Assessment & Plan Note (Signed)
Summary: 3 week fup/ccm   Vital Signs:  Patient profile:   60 year old female Menstrual status:  hysterectomy Weight:      153 pounds Pulse rate:   66 / minute BP sitting:   120 / 80  (right arm) Cuff size:   regular  Vitals Entered By: Romualdo Bolk, CMA (AAMA) (August 23, 2009 9:34 AM) CC: Follow-up visit on citalopram    History of Present Illness: Kimberly [name] comesin today for   for follow up of depressive symptom on medication. HAs been on 20 mg each dau for 2 weeks  and doing much better . Increase motivation and less irritability of sadness. family has also noted improvement.  No se of meds.     DM no change no other changein health status.   Preventive Screening-Counseling & Management  Alcohol-Tobacco     Alcohol drinks/day: 0     Smoking Status: current     Smoking Cessation Counseling: yes     Packs/Day: <0.25  Caffeine-Diet-Exercise     Caffeine use/day: 1     Does Patient Exercise: yes  Current Medications (verified): 1)  Albuterol 90 Mcg/act Aers (Albuterol) 2)  Bayer Aspirin 325 Mg Tabs (Aspirin) .... Take 1 Tablet By Mouth Once A Day 3)  Fenofibrate Micronized 200 Mg Caps (Fenofibrate Micronized) .Marland Kitchen.. 1 By Mouth Once Daily 4)  Lipitor 80 Mg  Tabs (Atorvastatin Calcium) .... Once Daily 5)  Lisinopril 20 Mg Tabs (Lisinopril) .... Take 1 Tablet By Mouth Once A Day 6)  Toprol Xl 50 Mg Tb24 (Metoprolol Succinate) .... Take 1 Tablet By Mouth Once A Day 7)  Fish Oil   Oil (Fish Oil) 8)  Vitamin D 400 Unit  Tabs (Cholecalciferol) 9)  Citalopram Hydrobromide 20 Mg Tabs (Citalopram Hydrobromide) .Marland Kitchen.. 1 By Mouth Once Daily 10)  Multivitamins   Tabs (Multiple Vitamin)  Allergies (verified): No Known Drug Allergies  Past History:  Past medical, surgical, family and social histories (including risk factors) reviewed, and no changes noted (except as noted below).  Past Medical History: Reviewed history from 01/30/2009 and no changes required. Coronary artery  disease Diabetes mellitus, type II Hyperlipidemia Hypertension PVD stress test? 2008  pre bypass surgery. ED visit June 2010  rectal bleeding felt to be hemorrhoid    Echo  Consults Dr. Arbie Cookey Dr. Eden Emms  Past Surgical History: Reviewed history from 01/31/2008 and no changes required. cabg Hysterectomy endometriosis aortofemoral bypass grafting November 2008  Past History:  Care Management: Cardiology: Eden Emms Vascular Surgery: Early Endocrinology: Everardo All  Family History: Reviewed history from 07/24/2009 and no changes required. Family History Diabetes 1st degree relative Family History Hypertension CAD Thyroid  sisters sister had parathyroidectomy Brother died alcoholic cirrhosis     Social History: Reviewed history from 01/30/2009 and no changes required. Married Current Smoker    9 per day  Alcohol use-no Regular exercise-yes Household of 2 see data base      Physical Exam  General:  alert and well-developed.   Psych:  Oriented X3, normally interactive, good eye contact, and not anxious appearing.    much brighter     Impression & Recommendations:  Problem # 1:   ADJUSTMENT DISORDER WITH DEPRESSED MOOD (ICD-309.0) Assessment Improved good med response  without sig se   . disc   continuing and follow up .   Problem # 2:  DIABETES MELLITUS, TYPE II (ICD-250.00) Assessment: Comment Only  Her updated medication list for this problem includes:    Bayer Aspirin 325  Mg Tabs (Aspirin) .Marland Kitchen... Take 1 tablet by mouth once a day    Lisinopril 20 Mg Tabs (Lisinopril) .Marland Kitchen... Take 1 tablet by mouth once a day  Complete Medication List: 1)  Albuterol 90 Mcg/act Aers (Albuterol) 2)  Bayer Aspirin 325 Mg Tabs (Aspirin) .... Take 1 tablet by mouth once a day 3)  Fenofibrate Micronized 200 Mg Caps (Fenofibrate micronized) .Marland Kitchen.. 1 by mouth once daily 4)  Lipitor 80 Mg Tabs (Atorvastatin calcium) .... Once daily 5)  Lisinopril 20 Mg Tabs (Lisinopril) .... Take 1 tablet by  mouth once a day 6)  Toprol Xl 50 Mg Tb24 (Metoprolol succinate) .... Take 1 tablet by mouth once a day 7)  Fish Oil Oil (Fish oil) 8)  Vitamin D 400 Unit Tabs (Cholecalciferol) 9)  Citalopram Hydrobromide 20 Mg Tabs (Citalopram hydrobromide) .Marland Kitchen.. 1 by mouth once daily 10)  Multivitamins Tabs (Multiple vitamin)  Patient Instructions: 1)  continue same medication and  2)  ROV  in MAY 2011 3)  HgBA1c prior to visit  ICD-9:  250.0 Prescriptions: CITALOPRAM HYDROBROMIDE 20 MG TABS (CITALOPRAM HYDROBROMIDE) 1 by mouth once daily  #30 x 6   Entered and Authorized by:   Madelin Headings MD   Signed by:   Madelin Headings MD on 08/23/2009   Method used:   Electronically to        [facility]  [address] [id number] (retail)       [address]       [place], [place]  [address]       Ph: [phone]       Fax: [phone]   RxID:   [id number]

## 2010-07-18 NOTE — Assessment & Plan Note (Signed)
Summary: 6 mos f/u #.cd   Vital Signs:  Patient profile:   60 year old female Menstrual status:  hysterectomy Height:      64 inches (162.56 cm) Weight:      153.25 pounds (69.66 kg) O2 Sat:      94 % on Room air Temp:     97.7 degrees F (36.50 degrees C) oral Pulse rate:   69 / minute BP sitting:   120 / 70  (left arm) Cuff size:   regular  Vitals Entered By: Josph Macho RMA (September 10, 2009 7:55 AM)  O2 Flow:  Room air CC: 6 month follow up/ CF Is Patient Diabetic? Yes   Referring Provider:  Dr Fabian Sharp Primary Provider:  DR Fabian Sharp  CC:  6 month follow up/ CF.  History of Present Illness: pt states she feels well in general, except for fatigue.  she has never had bony fracture or urolithiasis.   depression is improved since medication was started recently.  Current Medications (verified): 1)  Albuterol 90 Mcg/act Aers (Albuterol) 2)  Bayer Aspirin 325 Mg Tabs (Aspirin) .... Take 1 Tablet By Mouth Once A Day 3)  Fenofibrate Micronized 200 Mg Caps (Fenofibrate Micronized) .Marland Kitchen.. 1 By Mouth Once Daily 4)  Lipitor 80 Mg  Tabs (Atorvastatin Calcium) .... Once Daily 5)  Lisinopril 20 Mg Tabs (Lisinopril) .... Take 1 Tablet By Mouth Once A Day 6)  Toprol Xl 50 Mg Tb24 (Metoprolol Succinate) .... Take 1 Tablet By Mouth Once A Day 7)  Fish Oil   Oil (Fish Oil) 8)  Vitamin D 400 Unit  Tabs (Cholecalciferol) 9)  Citalopram Hydrobromide 20 Mg Tabs (Citalopram Hydrobromide) .Marland Kitchen.. 1 By Mouth Once Daily 10)  Multivitamins   Tabs (Multiple Vitamin)  Allergies (verified): No Known Drug Allergies  Past History:  Past Medical History: Last updated: 01/30/2009 Coronary artery disease Diabetes mellitus, type II Hyperlipidemia Hypertension PVD stress test? 2008  pre bypass surgery. ED visit June 2010  rectal bleeding felt to be hemorrhoid    Echo  Consults Dr. Arbie Cookey Dr. Eden Emms  Review of Systems       she has intermittent mild cough  Physical Exam  General:  normal  appearance.   Neck:  Supple without thyroid enlargement or tenderness.  Chest [place]:  no kyphosis Msk:  muscle bulk and strength are grossly normal.  no obvious joint swelling.  gait is normal and steady  Extremities:  no deformity no edema   Impression & Recommendations:  Problem # 1:  HYPERCALCEMIA (ICD-275.42) mild and intermittent.  uncertain etiology the mildly elevated 24-hr urinary ca++ tends to argue against fhh.  Other Orders: Est. Patient Level III (04540)  Patient Instructions: 1)  no treatment for the calcium is needed now 2)  return 1 year

## 2010-07-18 NOTE — Progress Notes (Signed)
  Phone Note From Other Clinic   Summary of Call: Carollee Herter from lab called stating pt brought in a 24 hour urine from 03/15/09. Lab doesn't have any orders of what needs to be done. Dr Everardo All looked at it and states he is not the one that ordered this. He instructed me to give them the ICD code of 275.42 hypercalcemia and T-Misc. Laboratory test [phone]). Initial call taken by: Josph Macho RMA,  August 27, 2009 11:39 AM

## 2010-07-18 NOTE — Progress Notes (Signed)
Summary: Pt req lab results and to discuss next step  Phone Note Call from Patient Call back at Home Phone [phone]   Caller: Patient Summary of Call: Pt called to get lab results and to discuss next step.  Initial call taken by: Lucy Antigua,  July 31, 2009 8:22 AM  Follow-up for Phone Call        see lab results. Follow-up by: Romualdo Bolk, CMA (AAMA),  July 31, 2009 1:33 PM

## 2010-07-18 NOTE — Assessment & Plan Note (Signed)
Summary: 4 month follow up/cjr   Vital Signs:  Patient profile:   60 year old female Menstrual status:  hysterectomy Weight:      149 pounds Pulse rate:   60 / minute BP sitting:   130 / 80  (left arm) Cuff size:   regular  Vitals Entered By: Romualdo Bolk, CMA (AAMA) (July 12, 2010 8:17 AM) CC: Follow-up visit on meds and labs, Hypertension Management   History of Present Illness: Kimberly [name] comes in today    for follow up of multiple medical problems   BP stable LIPIDS: no se of meds  DM: no lows eating well. Moods stable  but M in law 89 back and  caretakes  no taking meds  Exercises walking without limitation to help No need for inhaler now. CAD stable  no pain   Hypertension History:      She denies headache, chest pain, palpitations, dyspnea with exertion, orthopnea, PND, peripheral edema, visual symptoms, neurologic problems, syncope, and side effects from treatment.  She notes no problems with any antihypertensive medication side effects.        Positive major cardiovascular risk factors include female age 40 years old or older, diabetes, hyperlipidemia, hypertension, and current tobacco user.        Positive history for target organ damage include ASHD (either angina/prior MI/prior CABG) and peripheral vascular disease.  Further assessment for target organ damage reveals no history of stroke/TIA.      Preventive Screening-Counseling & Management  Alcohol-Tobacco     Alcohol drinks/day: 0     Smoking Status: current     Smoking Cessation Counseling: yes     Packs/Day: <0.25     Tobacco Counseling: to quit use of tobacco products  Caffeine-Diet-Exercise     Caffeine use/day: 1     Does Patient Exercise: yes     Type of exercise: walking  Current Medications (verified): 1)  Bayer Aspirin 325 Mg Tabs (Aspirin) .... Take 1 Tablet By Mouth Once A Day 2)  Fenofibrate Micronized 200 Mg Caps (Fenofibrate Micronized) .Marland Kitchen.. 1 By Mouth Once Daily 3)  Lipitor  80 Mg  Tabs (Atorvastatin Calcium) .... Once Daily 4)  Lisinopril 20 Mg Tabs (Lisinopril) .... Take 1 Tablet By Mouth Once A Day 5)  Toprol Xl 50 Mg Tb24 (Metoprolol Succinate) .... Take 1 Tablet By Mouth Once A Day 6)  Fish Oil   Oil (Fish Oil) 7)  Vitamin D 400 Unit  Tabs (Cholecalciferol) 8)  Citalopram Hydrobromide 20 Mg Tabs (Citalopram Hydrobromide) .Marland Kitchen.. 1 By Mouth Once Daily 9)  Multivitamins   Tabs (Multiple Vitamin) 10)  Ventolin Hfa 108 (90 Base) Mcg/act Aers (Albuterol Sulfate) .Marland Kitchen.. 1-2 Puffs  Qid As Needed Wheezing 11)  Alprazolam 0.25 Mg Tabs (Alprazolam) .Marland Kitchen.. 1 By Mouth Up To  Three Times A Day As Needed  For Anxiety  Allergies (verified): No Known Drug Allergies  Past History:  Past medical, surgical, family and social histories (including risk factors) reviewed, and no changes noted (except as noted below).  Past Medical History: Coronary artery disease Diabetes mellitus, type II Hyperlipidemia Hypertension PVD stress test? 2008  pre  iliac bypass surgery. ED visit June 2010  rectal bleeding felt to be hemorrhoid    Echo  inc rv  pos from copd neg bubble study for shunt Hypercalcemia evaluation  no dx  per Dr Everardo All  Dentures  Glaucoma Consults Dr. Arbie Cookey Dr. Eden Emms  Past Surgical History: Reviewed history from 01/31/2008 and no changes  required. cabg Hysterectomy endometriosis aortofemoral bypass grafting November 2008  Past History:  Care Management: Cardiology: Eden Emms Vascular Surgery: Early Endocrinology: Everardo All Opth : groat  Family History: Reviewed history from 07/24/2009 and no changes required. Family History Diabetes 1st degree relative Family History Hypertension CAD Thyroid  sisters sister had parathyroidectomy Brother died alcoholic cirrhosis     Social History: Reviewed history from 03/12/2010 and no changes required. 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  no rx , no claudication numbness  no change in vision  Physical Exam  General:  Well-developed,well-nourished,in no acute distress; alert,appropriate and cooperative throughout examination Head:  normocephalic and atraumatic.   Neck:  No deformities, masses, or tenderness noted. Lungs:  Normal respiratory effort, chest expands symmetrically. Lungs are clear to auscultation, no crackles or wheezes.no dullness.   Heart:  normal rate, regular rhythm, no gallop, no rub, no JVD, and no lifts.  systolic murmur left usb  1/6 less with supine  no clicks   Abdomen:  Bowel sounds positive,abdomen soft and non-tender without masses, organomegaly or hernias noted. no bruits  Pulses:  nl cap refill  pulses intact without delay   no bruits heard  Extremities:  no clubbing cyanosis or edema  Neurologic:  alert & oriented X3, strength normal in all extremities, and gait normal.   Skin:  left leg with few small 2mm collarette scaly lesions some faded no vesicles  regular     non tender Cervical Nodes:  No lymphadenopathy noted Psych:  Oriented X3, good eye contact, not anxious appearing, and not depressed appearing.    Diabetes Management Exam:    Foot Exam (with socks and/or shoes not present):       Sensory-Monofilament:          Left foot: normal          Right foot: normal       Inspection:          Left foot: normal          Right foot: normal       Nails:          Left foot: normal          Right foot: normal   Impression & Recommendations:  Problem # 1:  DIABETES MELLITUS, TYPE II (ICD-250.00) contolled meds Her updated medication list for this problem includes:    Bayer Aspirin 325 Mg Tabs (Aspirin) .Marland Kitchen... Take 1 tablet by mouth once a day    Lisinopril 20 Mg Tabs (Lisinopril)  .Marland Kitchen... Take 1 tablet by mouth once a day  Labs Reviewed: Creat: 0.9 (03/05/2010)     Last Eye Exam: No diabetic retinopathy.    (12/07/2009) Reviewed HgBA1c results: 6.4 (07/02/2010)  6.0 (03/05/2010)  Problem # 2:  HYPERTENSION (ICD-401.9)  Her updated medication list for this problem includes:    Lisinopril 20 Mg Tabs (Lisinopril) .Marland Kitchen... Take 1 tablet by mouth once a day    Toprol Xl 50 Mg Tb24 (Metoprolol succinate) .Marland Kitchen... Take 1 tablet by mouth once a day  BP today: 130/80 Prior BP: 120/80 (  03/12/2010)  Prior 10 Yr Risk Heart Disease: N/A (01/04/2007)  Labs Reviewed: K+: 4.2 (03/12/2010) Creat: : 0.9 (03/05/2010)   Chol: 135 (03/05/2010)   HDL: 27.60 (03/05/2010)   LDL: 87 (03/05/2010)   TG: 102.0 (03/05/2010)  Problem # 3:  HYPERLIPIDEMIA (ICD-272.4)  Her updated medication list for this problem includes:    Fenofibrate Micronized 200 Mg Caps (Fenofibrate micronized) .Marland Kitchen... 1 by mouth once daily    Lipitor 80 Mg Tabs (Atorvastatin calcium) ..... Once daily  Problem # 4:  TOBACCO USER (ICD-305.1) the same   trying to quit   Problem # 5:  PERIPHERAL VASCULAR DISEASE (ICD-443.9) Assessment: Unchanged no signs goo perfusion of feet and exercise tolerance  Problem # 6:  ADJ DISORDER WITH MIXED ANXIETY & DEPRESSED MOOD (ICD-309.28) Assessment: Improved counseled   Problem # 7:  CORONARY ARTERY DISEASE (ICD-414.00)  Her updated medication list for this problem includes:    Bayer Aspirin 325 Mg Tabs (Aspirin) .Marland Kitchen... Take 1 tablet by mouth once a day    Lisinopril 20 Mg Tabs (Lisinopril) .Marland Kitchen... Take 1 tablet by mouth once a day    Toprol Xl 50 Mg Tb24 (Metoprolol succinate) .Marland Kitchen... Take 1 tablet by mouth once a day  Complete Medication List: 1)  Bayer Aspirin 325 Mg Tabs (Aspirin) .... Take 1 tablet by mouth once a day 2)  Fenofibrate Micronized 200 Mg Caps (Fenofibrate micronized) .Marland Kitchen.. 1 by mouth once daily 3)  Lipitor 80 Mg Tabs (Atorvastatin calcium) .... Once daily 4)   Lisinopril 20 Mg Tabs (Lisinopril) .... Take 1 tablet by mouth once a day 5)  Toprol Xl 50 Mg Tb24 (Metoprolol succinate) .... Take 1 tablet by mouth once a day 6)  Fish Oil Oil (Fish oil) 7)  Vitamin D 400 Unit Tabs (Cholecalciferol) 8)  Citalopram Hydrobromide 20 Mg Tabs (Citalopram hydrobromide) .Marland Kitchen.. 1 by mouth once daily wean as directed 9)  Multivitamins Tabs (Multiple vitamin) 10)  Ventolin Hfa 108 (90 Base) Mcg/act Aers (Albuterol sulfate) .Marland Kitchen.. 1-2 puffs  qid as needed wheezing 11)  Alprazolam 0.25 Mg Tabs (Alprazolam) .Marland Kitchen.. 1 by mouth up to  three times a day as needed  for anxiety  Hypertension Assessment/Plan:      The patient's hypertensive risk group is category C: Target organ damage and/or diabetes.  Today's blood pressure is 130/80.  Her blood pressure goal is < 130/80.  Patient Instructions: 1)  continue  exercise   healthy eating  2)  stop   smoking. 3)  CPX  an labs with Hg a1c and urine microalbumin  before  4)  dx  diabetes  v70.0   Prescriptions: LISINOPRIL 20 MG TABS (LISINOPRIL) Take 1 tablet by mouth once a day  #90 Tablet x 3   Entered and Authorized by:   Madelin Headings MD   Signed by:   Madelin Headings MD on 07/12/2010   Method used:   Electronically to        [facility]  [address] [id number] (retail)       [address]       [place], [place]  [address]       Ph: [phone]       Fax: [phone]   RxID:   [id number] FENOFIBRATE MICRONIZED 200 MG CAPS (FENOFIBRATE MICRONIZED) 1 by mouth once daily  #90 Capsule x 4   Entered and Authorized by:   Madelin Headings MD   Signed by:  Madelin Headings MD on 07/12/2010   Method used:   [facility]        [facility]  [address] [id number]       [address]       [place], [place]  [address]       Ph: [phone]       Fax: [phone]   RxID:   [id number]    Orders Added: 1)  Est. Patient Level IV [24401]

## 2010-07-30 ENCOUNTER — Other Ambulatory Visit: Payer: Self-pay | Admitting: Internal Medicine

## 2010-09-23 LAB — DIFFERENTIAL
Basophils Relative: 1 % (ref 0–1)
Eosinophils Absolute: 0.3 10*3/uL (ref 0.0–0.7)
Eosinophils Relative: 3 % (ref 0–5)
Lymphocytes Relative: 18 % (ref 12–46)
Lymphs Abs: 1.7 10*3/uL (ref 0.7–4.0)
Neutro Abs: 6.7 10*3/uL (ref 1.7–7.7)
Neutrophils Relative %: 72 % (ref 43–77)

## 2010-09-23 LAB — URINALYSIS, ROUTINE W REFLEX MICROSCOPIC
Bilirubin Urine: NEGATIVE
Hgb urine dipstick: NEGATIVE
Ketones, ur: NEGATIVE mg/dL
Nitrite: NEGATIVE
pH: 5.5 (ref 5.0–8.0)

## 2010-09-23 LAB — CBC
HCT: 40.3 % (ref 36.0–46.0)
MCHC: 32.4 g/dL (ref 30.0–36.0)
MCV: 91.5 fL (ref 78.0–100.0)
RBC: 4.4 MIL/uL (ref 3.87–5.11)
WBC: 9.3 10*3/uL (ref 4.0–10.5)

## 2010-09-23 LAB — COMPREHENSIVE METABOLIC PANEL
AST: 25 U/L (ref 0–37)
BUN: 27 mg/dL — ABNORMAL HIGH (ref 6–23)
CO2: 25 mEq/L (ref 19–32)
Calcium: 10.8 mg/dL — ABNORMAL HIGH (ref 8.4–10.5)
Chloride: 106 mEq/L (ref 96–112)
Creatinine, Ser: 1.03 mg/dL (ref 0.4–1.2)
GFR calc Af Amer: >60 mL/min (ref >60)
GFR calc non Af Amer: 55 mL/min — ABNORMAL LOW (ref >60)
Glucose, Bld: 98 mg/dL (ref 70–99)
Total Bilirubin: 0.6 mg/dL (ref 0.3–1.2)

## 2010-10-29 NOTE — Procedures (Signed)
LOWER EXTREMITY ARTERIAL DUPLEX   INDICATION:  Followup hematoma evaluation.   HISTORY:  Diabetes:  Yes.  Cardiac:  MI 2000.  Hypertension:  Yes.  Smoking:  Yes.  Previous Surgery:  Aortobifemoral bypass graft on 05/03/2007 by Dr.  Arbie Cookey.   SINGLE LEVEL ARTERIAL EXAM                          RIGHT                LEFT  Brachial:               114                  124  Anterior tibial:        120                  108  Posterior tibial:       121                  123  Peroneal:  Ankle/Brachial Index:   0.98                 0.98   LOWER EXTREMITY ARTERIAL DUPLEX EXAM   DUPLEX:  Patent common femoral artery, femoral artery and profunda  artery.  No flow noted in right hematoma.   IMPRESSION:  Right groin hematoma noted with measurement of 3.25 x 3.68  cm.  There is no flow noted in the hematoma.  Patent right common  femoral artery, femoral artery and profunda artery.   ___________________________________________  Larina Earthly, M.D.   CB/MEDQ  D:  05/25/2009  T:  05/25/2009  Job:  [id number]

## 2010-10-29 NOTE — Assessment & Plan Note (Signed)
OFFICE VISIT   Kimberly [name], Kimberly [name]  DOB:  14-Feb-1951                                       12/20/2008  [id number]#:[id number]   The patient presents today for evaluation of a right groin mass that is  seen on recent CT scan.  The patient is a very pleasant 60 year old  white female who is well-known to me from prior aortobifemoral bypass  for severe aortic occlusive disease on 05/03/2007.  She had done well  since the time of her surgery and had no recurrent lower extremity  ischemia.  I had actually seen her in April of 2009 for concern  regarding a fullness in her right groin.  At that time she did not have  any evidence of expansile mass and did have thickening on the right side  more pronounced than on the left.  She recently underwent a CAT scan on  12/07/2008 for evaluation of abdominal pain.  She states having diarrhea  symptoms at that time and the pain has completely resolved.  Incidental  finding on her CT scan was of the mass in her right groin adjacent to  the right groin anastomosis.  This was interpreted as a thrombosed false  aneurysm.  Maximal diameter was 4 cm.   PHYSICAL EXAM:  This area is completely nontender.  There is a golf ball  size fullness in her right groin as compared to her left groin.  She has  2+ femoral pulses bilaterally and has 2+ dorsalis pedis pulses  bilaterally.  The mass itself is not expansile in nature.  The patient  does not feel there has been any change in this over time.   I reviewed the CT with the patient showing her the area of concern.  My  guess is that this probably was an old hematoma that is organized and  has not totally resolved.  I do not feel that she is at any risk for  disruption of her anastomosis since there is no evidence that this has  changed or had an acute bleed.  I have recommend that we see her in 6  months with ultrasound to rule out any progression in size and she will  notify us should she  develop any clinical change.  I did wish her happy  birthday on her 59th birthday today and plan to see her again in 6  months.   Larina Earthly, M.D.  Electronically Signed   TFE/MEDQ  D:  12/20/2008  T:  12/21/2008  Job:  [id number]   cc:   Neta Mends. Fabian Sharp, MD

## 2010-10-29 NOTE — Procedures (Signed)
BYPASS GRAFT EVALUATION   INDICATION:  Follow-up evaluation of aorto-bifemoral bypass graft.  Patient denies claudication and rest pain.   HISTORY:  Diabetes:  Yes.  Cardiac:  Stents.  Hypertension:  Yes.  Smoking:  Yes, five cigarettes per day.  Previous Surgery:  Aorto-bifemoral bypass graft on 05/03/07 by Dr.  Arbie Cookey.   SINGLE LEVEL ARTERIAL EXAM                               RIGHT              LEFT  Brachial:                    128                124  Anterior tibial:             124                126  Posterior tibial:            128                130  Peroneal:  Ankle/brachial index:        1.0                >1.0   PREVIOUS ABI:  Date: 05/28/07  RIGHT:  1.15  LEFT:  1.13   LOWER EXTREMITY BYPASS GRAFT DUPLEX EXAM:   DUPLEX:  1. Patent aorto-bifemoral bypass graft without evidence of stenosis.  2. Patent native CFAS bilaterally.   IMPRESSION:  1. Essentially stable ankle brachial indices bilaterally.  2. Patent aorto-bifemoral bypass graft without evidence of stenosis.  3. Patent native common femoral arteries bilaterally .   ___________________________________________  Larina Earthly, M.D.   PB/MEDQ  D:  11/16/2007  T:  11/16/2007  Job:  [id number]

## 2010-10-29 NOTE — Assessment & Plan Note (Signed)
[facility]                            CARDIOLOGY OFFICE NOTE   NAME:Kimberly [name], Kimberly [name]                        MRN:          [id number]  DATE:03/31/2007                            DOB:          Apr 01, 1951    Ms. Kratt is referred today by Dr. Tawanna Cooler Early for preoperative clearance.   The patient has not been seen in our office since 2002.   She has a distant history of a circumflex MI with a v-fib arrest,  angioplasty and stenting of the circ.  She had residual 70% LAD disease  and an occluded right with collaterals.  Unfortunately, she continues to  smoke 9 cigarettes a day.   Her activity is limited by significant claudication, we have records in  our office of PVD with left sided ABIs of 0.64 and right sided ABIs of  0.93.  During her cath back in 2000 there was a distal aortic stenosis  as well as a 70% stenosis in the right common iliac artery and 80%  stenosis in the left common iliac.   The patient is now having claudication at 50-75 feet.  There has been no  non-healing ulcers.   Since her activity is primarily limited by her COPD and her claudication  she denies any chest pain, there has been no PND or orthopnea, no  palpitations or syncope.   She has not had a stress test since 2002, at that time the EF was 62%  and was actually normal without evidence of her previous inferior Fults  MI from 2000.   REVIEW OF SYSTEMS:  Remarkable for occasional cough.   Otherwise negative.   PAST MEDICAL HISTORY:  1. Diet controlled diabetes.  2. Hypercholesterolemia.  3. Hypertension.  4. Previous MI in 2000 with known 3-vessel disease.  5. Significant peripheral vascular disease.   MEDICATIONS:  1. Aspirin a day.  2. Calcium.  3. Fenofibrate 200 in the morning.  4. Lisinopril 20 a day.  5. Lipitor 80 a day.  6. Metoprolol to be changed to 50 b.i.d.  7. Fish oil.   FAMILY HISTORY:  Significant for premature coronary disease in her  father's  side.   Patient is married with 3 children, her husband's health is fine.  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  She smokes 9  cigarettes a day, she does not drink.   EXAMINATION:  Remarkable for an elderly white female in no distress, she  looks older than her stated age.  Her blood pressure is 140/80 in each  arm, pulse is 80 and regular, respirations are 16, she is afebrile and  there are no carotid bruits.  NECK:  Supple, no lymphadenopathy, no thyromegaly, no JVP elevation.  LUNGS:  Clear with good diaphragmatic motion, no active wheezing.  There is an S1-S2 with normal heart sounds, PMI is normal.  ABDOMEN:  Benign, there is no renal bruits, no tenderness, no  hepatosplenomegaly, no hepatojugular reflux.  Left femoral pulse is diminished with a bruit, right femoral pulse is  +2, I can not palpate any  pedal pulses on the left side.  PT and DP are  +1 on the right, there is no tissue loss or ulcers.  NEURO:  Nonfocal, there is no muscular weakness.  SKIN:  Warm and dry.   Her EKG today showed sinus rhythm with nonspecific ST-T wave changes in  the inferior lateral leads.   IMPRESSION:  1. Preoperative clearance, patient appears to need an aortobifemoral      graft for arterial insufficiency, this is a significant problem for      her.  She is at high risk, she has known 3-vessel disease with a      previous myocardial infarction.  We will start by doing a Myoview      study on her with adenosine, I suspect this will be positive and      she may end up needing heart cath.  If this is the case we will      probably refer her to Dr. Excell Seltzer for this given her known vascular      disease.  For the time being we will continue her aspirin and      increase her beta blocker to 50 b.i.d., this is the best thing we      can do to limit her risk.  I explained to the patient that no matter what we do she will be at  moderate to high risk for vascular surgery.  If there  is marked ischemia  on her Myoview she will be referred for catheterization first.  1. Peripheral vascular disease.  Not a candidate for Pletal given her      coronary disease, following up with Dr. Arbie Cookey, need for      aortobifemoral grafting.  Currently progressive claudication at 50-      75 feet with no tissue loss.  2. Hypertension, currently well-controlled.  Continue lisinopril at      current dose, low salt diet.  3. Smoking.  I explained to the patient the risk for progressive      vascular and coronary disease from smoking and I also explained to      her she would do well to stop even 2 weeks before surgery, she will      try.  She does not want to try Chantix at this time.  It may be      beneficial to try to give her a nicotine patch postoperatively to      see if she can quite then.  4. Hypercholesterolemia, continue high dose Lipitor 8 a day, followup      lipid and liver profile in 6 months.   Further recommendations regarding preoperative clearance will be based  on the results of her Myoview study but we will have a low threshold to  proceed with catheterization.     Noralyn Pick. Eden Emms, MD, [facility]  Electronically Signed    PCN/MedQ  DD: 03/31/2007  DT: 04/01/2007  Job #: [id number]

## 2010-10-29 NOTE — Assessment & Plan Note (Signed)
OFFICE VISIT   Kimberly [name], Kimberly [name]  DOB:  22-Feb-1951                                       05/28/2007  [id number]#:[id number]   The patient presents today for followup of her aortobifemoral bypass  grafting for aorto-occlusive disease on 05/03/2007.  She did extremely  well in the hospital and was discharged to home on postoperative day #4.  She has continued to do well at home and has returned to her usual  appetite and bowel function.  She has not lost any further weight after  discharge.  She does have slightly diminished stamina from  preoperatively.   PHYSICAL EXAM:  Her abdominal and femoral wounds are all well-healed,  and she has 2+ femoral and 2+ dorsalis pedis pulses bilaterally.  She  underwent noninvasive vascular laboratory study in our office today, and  this reveals normal ankle/arm indexes bilaterally.  I am quite pleased  with her initial result, as is Ms. Petit.  I plan to see her again in 6  months with repeat ankle/arm indexes.  She will notify us should she  develop any difficulty in the interim.   Larina Earthly, M.D.  Electronically Signed   TFE/MEDQ  D:  05/28/2007  T:  05/31/2007  Job:  [id number]   cc:   Neta Mends. Fabian Sharp, MD

## 2010-10-29 NOTE — Assessment & Plan Note (Signed)
OFFICE VISIT   Kimberly [name], Kimberly [name]  DOB:  [birth year]                                       10/08/2007  [id number]#:[id number]   Patient presents today for followup of her aortobifemoral bypass.  She  was undergoing physical examination from her primary care physician.  There was some concern regarding a knot in her right groin.   Patient has done well.  She has no new medical difficulties,  specifically no cardiac issues.  She is walking with no claudication.   PHYSICAL EXAMINATION:  Blood pressure is 118/68.  Pulse 75.  Respirations 16.  She has 2+ radial, 2+ femoral, and 2+ dorsalis pedis  pulses.  Her groin incisions have the usual amount of thickening,  somewhat more pronounced on the right side than on the left side.  She  does not have any evidence of hernia.  She does have no evidence of  expansile pulse and no evidence of false aneurysm.   I reassured patient regarding this, and we will continue to follow up  with her in our vascular lab protocol.  She will notify us should she  develop any difficulty in the interim.   Larina Earthly, M.D.  Electronically Signed   TFE/MEDQ  D:  10/08/2007  T:  10/11/2007  Job:  [id number]

## 2010-10-29 NOTE — Discharge Summary (Signed)
NAMEJENNENE, DOWNIE NO.:  [id number]   MEDICAL RECORD NO.:  [id number]          PATIENT TYPE:  INP   LOCATION:  2001                         FACILITY:  [facility]   PHYSICIAN:  Larina Earthly, M.D.    DATE OF BIRTH:  Apr 02, 1951   DATE OF ADMISSION:  05/03/2007  DATE OF DISCHARGE:                               DISCHARGE SUMMARY   ANTICIPATED DATE OF DISCHARGE:  05/07/2007.   ADMITTING DIAGNOSIS:  Aortoiliac occlusive disease.   DISCHARGE/SECONDARY DIAGNOSES:  1. Aortoiliac occlusive disease status post aortobifemoral bypass      grafting.  2. History of diabetes mellitus type 2, diet controlled.  3. Hypercholesterolemia.  4. Hypertension.  5. Coronary artery disease with myocardial infarction in 2000, with      subsequent coronary artery bypass graft.  6. Ongoing tobacco abuse.  7. No known drug allergies.   PROCEDURE:  05/03/2007 aortobifemoral bypass grafting using 14 x 8-mm  Hemashield graft by Dr. Tawanna Cooler Early.   BRIEF HISTORY:  Ms. Melka is a 60 year old Caucasian female who was seen  by Dr. Arbie Cookey for progressive, severe claudication in her left leg.  Activity was quite limited.  She underwent outpatient arteriogram and CT  angiogram which revealed ectasia and stenosis in her infrarenal aorta  and also severe stenosis in her left iliac artery.  Dr. Arbie Cookey  recommended that she undergo aortobifemoral bypass grafting.  Due to her  cardiac history, she was seen by Dr. Thersa Salt who felt that she  could undergo surgery.  She apparently underwent a Myoview study, but I  do not currently have the results of this.   HOSPITAL COURSE:  Ms. Kohen was electively admitted to [facility] on 05/03/2007.  She underwent the previously-mentioned  procedure.  Postoperatively she was transferred the Surgical Intensive  Care Unit, where she remained until postoperative day 1.  She was then  transferred to Telemetry in unit 2000 where the patient remained until  discharge.  During hospitalization she did undergo a tobacco cessation  consult and plans to quit cold [place].  She was also seen by Cardiac  Rehab for mobility, due to her history of coronary artery disease.  By  discharge she was ambulating with a steady gait.  In regard to her  diabetes, she was on a NovoLog insulin sliding scale.  Of note, she did  have a hemoglobin A1C in May of this year that was slightly elevated at  6.1.  From a surgical standpoint she made steady progress.  Nasogastric  tube was discontinued on postoperative day 1.  On postoperative day 2  she was started on a clear liquid diet, and by postoperative day 3 was  advanced to a regular consistency diet.  She had had a bowel movement by  that time.  At that time her metoprolol was resumed.  Her vitals  remained stable.  She was afebrile.  Oxygen saturations were 92% and 95%  on room air.  At this time she was without nausea or vomiting.  Oxycodone was used for pain management.  Her incisions appeared to  be  healing well, without signs of infection in feet.  Appeared adequately  perfused.  It is anticipated that if Ms. Kahrs tolerates a regular-  consistency diet that she will be discharged home on postoperative day  4, on 05/07/2007.  Official discharge will be pending no significant  changes or status.  Currently she remains in stable condition.   LABORATORIES:  Her most recent labs from 05/05/2007 show a sodium of  133, potassium 3.5, chloride 104, CO2 of 25, blood glucose 170, BUN of  8, creatinine 0.7, white count 7.5, hemoglobin 11.3, hematocrit 33.0,  platelet count 145.  Preoperative liver function tests were within  normal limits.   DISCHARGE INSTRUCTIONS:  1. Percocet 5/325 mg one or two tablets p.o. q.4 hours p.r.n. pain.  2. Aspirin 325 mg one p.o. q. a.m.  3. Fenofibrate 200 mg p.o. q. a.m.  4. Lisinopril 20 mg p.o. q. a.m.  5. Lipitor 80 mg p.o. q. a.m.  6. Metoprolol 50 mg p.o. b.i.d.  7. Fish oil  1000 mg p.o. q.i.d.  8. Vitamin D 1.25 mg weekly on Mondays.   DISCHARGE INSTRUCTIONS:  1. She can continue a diabetic-appropriate diet.  2. She is to increase activities slowly.  3. Avoid driving or heavy lifting for the next three weeks.  4. She may shower and clean her incisions gently with soap and water.  5. Should call if she develops fever of greater than 101, redness or      drainage from her incision site, or persistent nausea and vomiting      or abdominal pain.  6. She will see Dr. Arbie Cookey in the [facility] office in      approximately three weeks, with postoperative ABI study.  Our      office will contact her regarding a specific appointment date and      time.      Jerold Coombe, P.A.      Larina Earthly, M.D.  Electronically Signed    AWZ/MEDQ  D:  05/06/2007  T:  05/06/2007  Job:  [id number]   cc:   Larina Earthly, M.D.  Noralyn Pick. Eden Emms, MD, [facility] [place]  Neta Mends. Fabian Sharp, MD

## 2010-10-29 NOTE — Op Note (Signed)
Kimberly [name], Kimberly [name]                 ACCOUNT NO.:  [id number]   MEDICAL RECORD NO.:  [id number]          PATIENT TYPE:  INP   LOCATION:  2550                         FACILITY:  [facility]   PHYSICIAN:  Larina Earthly, M.D.    DATE OF BIRTH:  06-20-1950   DATE OF PROCEDURE:  05/03/2007  DATE OF DISCHARGE:                               OPERATIVE REPORT   PREOPERATIVE DIAGNOSIS:  Aortoiliac occlusive disease.   POSTOPERATIVE DIAGNOSIS:  Aortoiliac occlusive disease.   PROCEDURE:  Aortobifemoral bypass with a 14 Hemashield graft.   SURGEON:  Larina Earthly, M.D.   ASSISTANT:  Di Kindle. Edilia Bo, M.D.  Jerold Coombe, P.A.   ANESTHESIA:  General endotracheal.   COMPLICATIONS:  None.   DISPOSITION:  To recovery room extubated.   PROCEDURE IN DETAIL:  The patient was taken to the operating room,  placed in the supine position where the area of the abdomen and both  groins were prepped and draped in the usual sterile fashion.  Incision  was made from the level of the xiphoid to below the umbilicus, carried  down through the midline fascia with electrocautery.  The midline fascia  was opened with electrocautery and the abdomen was entered by opening  the peritoneum.  There were dense adhesions of the omentum to the pelvis  and these were all taken down.  The Omni-Tract retractor was used for  exposure.  The transverse colon and omentum were reflected superiorly  and the small bowel was reflected to the right.  The duodenum was  mobilized off the aorta and the aorta was encircled below the level of  the left renal vein.  Dissection was extended down to the bifurcation.  A tunnel was created toward the groin taking care to pass behind the  ureters bilaterally.  Oblique incisions were made bilaterally over the  common femoral arteries and the common femoral arteries were isolated  with vessel loops bilaterally.  A tunnel was created between the level  of the common femoral vein and the  bifurcation bilaterally.  The  crossing veins at the inguinal ligament were ligated and divided.  The  patient was given 25 grams of Mannitol and 7000 units of intravenous  heparin.  After adequate circulation time, the aorta was occluded below  the level of the renal arteries and distally above the level of the  inferior mesenteric artery.  The aorta was transected and an ellipse of  artery was removed and the artery was oversewn distally with running 3-0  Prolene suture.  A 14 Hemashield graft was brought into the field, cut  to the appropriate length and using a felt strip for reinforcement, was  sewn end-to-end to the aorta below the level of the renal arteries with  running 3-0 Prolene suture.  Anastomosis was tested and found to be  adequate.  The limbs of the graft were then brought to respective groins  through the prior created tunnel.  The common femoral arteries were  occluded proximally and distally bilaterally.  The common femoral  arteries were opened with an 11 blade and  extended longitudinally with  Potts scissors.  The limbs of the graft were cut to the appropriate  length, sewn end-to-side to the arteries bilaterally with running 6-0  Prolene sutures.  Prior to completion of the anastomosis, the usual  flushing maneuvers were undertaken.  The anastomosis was completed and  flow was restored to first the right and then the left limb of the  graft.  Good anastomoses were encountered and the patient was given 50  mg of protamine to reverse the heparin.  The wounds were irrigated with  saline.  Hemostasis with electrocautery.  The small bowel was run in its  entirety, found to be without injury and placed back in the pelvis.  The  transverse colon and omentum were placed over this.  The midline fascia  was closed with #1 PDS suture beginning proximally and distally and  tying in the middle.  The groins were closed with several layers of 2-0  Vicryl in the subcutaneous tissue.   The midline abdominal and both groin  incisions were closed with 3-0 subcuticular Vicryl stitch.  A sterile  dressing was applied.  The patient was taken to the recovery room with  palpable posterior tibial pulses bilaterally.      Larina Earthly, M.D.  Electronically Signed     TFE/MEDQ  D:  05/03/2007  T:  05/03/2007  Job:  [id number]

## 2010-10-29 NOTE — H&P (Signed)
HISTORY AND PHYSICAL EXAMINATION   March 19, 2007   Re:  [place], Kimberly [name]                 DOB:  [birth year]   DATE OF ADMISSION:  Undetermined.   CHIEF COMPLAINT:  Left leg claudication.   HISTORY OF PRESENT ILLNESS:  The patient is a 60 year old white female  with progressive severe claudication in her left leg.  She reports this  is quite limiting to her and she is unable to do her usual activities  due to restriction of flow and pain in her left foot with walking.  She  had undergone a recent outpatient arteriogram and CT angiogram for  follow up of this.  This reveals diffuse ectasia and stenosis in her  infrarenal aorta and also severe stenosis in her left iliac artery.  She  is admitted at this time for aortobifemoral bypass grafting.   PAST MEDICAL HISTORY:  Significant for diet controlled diabetes, she  does have a history of elevated cholesterol, hypertension, had a  previous myocardial infarction in 2000 with subsequent coronary artery  bypass grafting.   FAMILY HISTORY:  Significant for premature atherosclerotic coronary  disease in her father.   SOCIAL HISTORY:  She is married with 3 children.  She does continue to  smoke 1 pack of cigarettes per day.  She does not drink alcohol on a  regular basis.   REVIEW OF SYSTEMS:  Positive for pain in her leg with walking and muscle  pain.   PHYSICAL EXAMINATION:  General:  This is a well-developed, well-  nourished white female appearing stated age of 20.  Vital signs:  Blood  pressure is 150/85, pulse 87, respirations 18.  She is grossly intact  neurologically.  Her carotid arteries are without bruits bilaterally.  She has 2+ radial pulses, she has diminished left femoral and 2+ right  femoral pulse.  She has absent pedal pulses on her left leg.  She has a  1-2+ right popliteal and right dorsalis pedis pulse.  She has no tissue  loss.   IMPRESSION:  Severe aortoiliac occlusive disease.   PLAN:  The  patient is to undergo preoperative coronary clearance and  then we will proceed with aortobifemoral bypass grafting for arterial  insufficiency.  She understands the procedure including approximately 1-  2 day Intensive Care hospitalization followed by a total of 5-7 day  hospitalization for recovery.   Larina Earthly, M.D.  Electronically Signed   TFE/MEDQ  D:  03/19/2007  T:  03/22/2007  Job:  [id number]

## 2010-10-29 NOTE — Op Note (Signed)
NAMEBONNELL, PLACZEK                 ACCOUNT NO.:  [id number]   MEDICAL RECORD NO.:  [id number]          PATIENT TYPE:  AMB   LOCATION:  SDS                          FACILITY:  [facility]   PHYSICIAN:  Charles E. Fields, MD  DATE OF BIRTH:  1950-08-14   DATE OF PROCEDURE:  02/26/2007  DATE OF DISCHARGE:                               OPERATIVE REPORT   PROCEDURE:  Aortogram with bilateral lower extremity runoff.   PREOPERATIVE DIAGNOSIS:  Lower extremity claudication.   POSTOPERATIVE DIAGNOSIS:  Lower extremity claudication.   ANESTHESIA:  Local with IV sedation.   OPERATIVE DETAILS:  After obtaining informed consent, the patient was  taken to the [facility] lab.  The patient was placed in the supine position on  the angio table.  Next, both groins were prepped and draped in the usual  sterile fashion.  Local anesthesia was infiltrated over the right common  femoral artery.  A Majestic needle was used to cannulate the right  common femoral artery and a 0.035 Wholey wire advanced up into the right  common iliac system under fluoroscopic guidance.  A 5-French sheath was  then placed over the guidewire in the right common femoral artery.  An  attempt was made to advance the [facility] wire up into the abdominal aorta.  However, this would not advance easily.  A pigtail catheter was placed  over the guidewire to give some stability to the wire.  However again,  the wire would not advance into the more proximal aorta.  Therefore, the  [facility] wire was removed and a 0.035 J-tip guidewire brought up on the  operative field.  With some manipulation, I was able to advance this up  into the distal thoracic aorta.  A 5-French pigtail catheter was then  placed over the guidewire into the lower aorta.   Next, an abdominal aortogram was obtained.  This shows a widely patent  right and left renal artery.  There is aneurysmal dilatation of the  abdominal aorta which begins just below the base of the left renal  artery.  There is a dumbbell type configuration of this aneurysm.  There  is narrowing of the aorta between the two ends of the dumbbell.  There  is severe atherosclerotic change of the distal abdominal aorta, as well.  There are multiple high grade stenoses in the left common iliac system,  greater than 80%.  The left external iliac artery is patent.  The right  common iliac artery is patent.  The right internal iliac artery is  patent.  The left internal iliac artery fills but is heavily diseased  and appears to fill via collaterals.   Next, the pigtail catheter was pulled down near the aortic bifurcation.  A lower extremity runoff view was obtained.  Common femoral arteries,  profunda femoris arteries, superficial femoral arteries, popliteal  arteries, posterior tibial, anterior tibial and peroneal arteries are  all widely patent down into the foot.   Next, the pigtail catheter was removed over a guidewire.  The guidewire  was also removed and the sheath left in place to be  pulled in holding  area.  The patient tolerated the procedure well and there were no  complications.  The patient was taken to the holding area in stable  condition.   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      Janetta Hora. Fields, MD  Electronically Signed     CEF/MEDQ  D:  02/26/2007  T:  02/27/2007  Job:  [id number]

## 2010-10-29 NOTE — Consult Note (Signed)
NEW PATIENT CONSULTATION   [name], Kimberly B  DOB:  Dec 16, 1950                                       02/17/2007  ZOXWR#:[id number]   Kimberly [name] is in today for evaluation of lower extremity discomfort  from arterial insufficiency.  She is a 60 year old white female who has  had progressively severe claudication in her left leg.  She denies any  right leg claudication.  She reports that she can walk increasingly  short distances before she begins to have claudication, particularly in  her left calf and extending up and through her thigh.  She does not have  any history of tissue loss or rest [name].  She does have what she  describes as numbness and tingling in her left leg at rest.  I explained  to her that this is different pathology and would not be related to  arterial insufficiency.   PAST MEDICAL HISTORY:  1. Myocardial infarction in 2000 and subsequent coronary artery bypass      grafting.  2. She is a diet-controlled diabetic.  She does have elevated      cholesterol and elevated lipids.   FAMILY HISTORY:  Significant for premature atherosclerotic disease in  her father.   SOCIAL HISTORY:  She is married with 3 children.  She does not drink  alcohol on a regular basis.  Unfortunately, she continues to smoke 1  pack of cigarettes per day.   REVIEW OF SYSTEMS:  Positive only for muscle [name], leg [name] with  walking.  Her weight is 157 pounds.  She is 5 feet 4 inches tall.  She  has no known drug allergies.   PHYSICAL EXAMINATION:  Well-developed, well-nourished white female.  Stated age 60.  Blood pressure 127/77, pulse 74, respirations 18.  Her  radial pulses are 2+.  She has 1+ femoral pulses bilaterally.  I can  palpate a faint left dorsalis pedis pulse.  She has a 2+ right dorsalis  pedis pulse and a 2+ right popliteal pulse.  She has no tissue loss.   I have a copy of her noninvasive laboratory studies from [facility] from  January 08, 2007, which reveals  an ankle brachial index of 0.85 on the  right and 0.64 on the left with evidence of iliac occlusive disease.   I discussed options with Kimberly [name].  I explained this is not limb  threatening.  I explained that she does have evidence of occlusive  disease and in all likelihood would be amenable to angioplasty.  I  explained the next step would be arteriography for further visualization  followed by angioplasty if indicated.  She understands this and wishes  to proceed for arteriogram.  We have scheduled this at her convenience  on September 12, at [facility].  I explained that Dr. Fabienne Bruns will be doing the procedure, and I introduced Kimberly [name] to Dr.  Darrick Penna in the office today.   Kimberly [name], M.D.  Electronically Signed   TFE/MEDQ  D:  02/17/2007  T:  02/18/2007  Job:  [id number]

## 2010-10-29 NOTE — Op Note (Signed)
NAMEPATTY, LEITZKE NO.:  [id number]   MEDICAL RECORD NO.:  [id number]          PATIENT TYPE:  INP   LOCATION:  2001                         FACILITY:  [facility]   PHYSICIAN:  Larina Earthly, M.D.    DATE OF BIRTH:  1951-01-07   DATE OF PROCEDURE:  DATE OF DISCHARGE:                               OPERATIVE REPORT   PATIENT:  Kimberly [name].   DATE OF PROCEDURE:  05/03/2007.   PREOPERATIVE DIAGNOSIS:  Aortoiliac occlusive disease.   POSTOPERATIVE DIAGNOSIS:  Aortoiliac occlusive disease.   PROCEDURE:  Aortobifemoral bypass grafting with a 14 x 8 Hemashield  aortobifemoral bypass.   SURGEON:  Larina Earthly, M.D.   ASSISTANTS:  Di Kindle. Edilia Bo, M.D. and Jerold Coombe, P.A.   ANESTHESIA:  General endotracheal.   COMPLICATIONS:  None.   DISPOSITION:  To recovery room stable.   PROCEDURE IN DETAIL:  The patient was taken to the operating room and  placed supine.  The area of the abdomen and both groins were prepped and  draped in the usual sterile fashion.  Incision was made between the  level of the xiphoid and below the umbilicus, carried down through the  midline fascia with electrocautery.  The peritoneum was opened and there  was dense adhesions of the omentum to the anterior abdominal Babilonia and  the pelvis.  These were all taken down.  The Omni-Tract retractor was  used for exposure.  The transverse colon omentum reflected superiorly  and the small bowel was reflected to the right.  The duodenum was  mobilized off the aorta and the aorta was encircled at the level of the  left renal vein.  The aortic bifurcation was then exposed and tunnels  were created towards the groin taking care to pass behind the level of  the ureter.  Next oblique incision was made over the right and left  groins, carried down nicely at the common femoral arteries which were  minimally calcified and were encircled with vessel loops bilaterally.  A  tunnel was created  between the level of the groins and the aortic  bifurcation bilaterally.  The crossing vein at the inguinal ligament was  divided bilaterally.  The patient was given 25 grams of mannitol and  7000 units of intravenous heparin.  After adequate circulation time the  aorta was occluded below the level of the renal arteries and occluded  above the area of the bifurcation.  The aorta was transected below the  level of the proximal clamp.  The aorta was oversewn distally above the  level of the inferior mesenteric artery. A 14 x 8 Hemashield graft was  brought into the field and cut to appropriate length using a felt strip  for reinforcement was sewn on end-to-end of the aorta with a running 3-0  Prolene suture.  The anastomosis was tested and found to be adequate.  Next the right and left limbs of the graft were brought to their  respective groins through the prior created tunnels.  The common femoral  arteries were occluded bilaterally, opened with an 11  blade  and extended longitudinally with Potts scissors.  The graft was cut to  the appropriate length, spatulated and sewn on end-to-side to the common  femoral arteries bilaterally with running 6-0 Prolene sutures.  Prior to  completion of each anastomoses the usual flush maneuvers were  undertaken.  The anastomosis was then completed.      Larina Earthly, M.D.  Electronically Signed     TFE/MEDQ  D:  05/03/2007  T:  05/05/2007  Job:  [id number]

## 2010-10-29 NOTE — Assessment & Plan Note (Signed)
[facility] HEALTHCARE                            CARDIOLOGY OFFICE NOTE   NAME:Kimberly [name]                        MRN:          [id number]  DATE:08/23/2008                            DOB:          January 25, 1951    Kimberly [name] returns today for followup with the request of Dr. Fabian Sharp.  I  have seen her in the past for hypertension and claudication.  She just  had an aortobifem by Dr. Gretta Began.  I saw her in October 2008 for  clearance.  She has a distant history of coronary disease with stenting  of the circ.  She has an occluded right with collaterals.  She is still  smoking.  Her postop ABIs have returned to normal.  She is able to walk  unlimited amounts now.  She has significant COPD.  She has had chronic  cough, no sputum production.   She has a 2D echocardiogram, which suggested mild-to-moderate right-  sided enlargement.  There is also a question of a PFO.  I told Bowie  that I honestly thought that her right ventricle is dilated due to her  smoking.  There are no really bad signs of cor pulmonale in terms of a  flattened septum.  However, I doubt she has a shunt.  To confirm this,  we will bring her back to do a bubble study.   She seemed relieved that she did not have a horrible heart problem.   However, I was very firm with Maureen Ralphs.  I think that she is going to be  in end-stage COPD and require home O2 within the next 5-6 years if she  keeps smoking.   Her coronary risk factors are well modified.  She is not having chest  pain.  She had no cardiac complications at the time of her vascular  surgery.  Her last Myoview on April 06, 2007 was nonischemic.   She has been otherwise compliant with her meds.  She is on an aspirin a  day, fenofibrate 200 a day, lisinopril 20 a day, Lipitor 80 a day,  metoprolol 50 a day, fish oil, and vitamins.   The patient is not married.  She is sedentary.  Her activity levels are  increasing after aortobifem.  She  smokes about half pack a day.  She  does not drink.   FAMILY HISTORY:  Noncontributory.   CURRENT MEDICATIONS:  As Above.   ALLERGIES:  She has no known allergies.   PHYSICAL EXAMINATION:  GENERAL:  Remarkable for bronchitic female in no  distress.  VITAL SIGNS:  Weight is 148, blood pressure 120/78, pulse 74 and  regular, respiratory rate 14, and afebrile.  HEENT:  Unremarkable.  NECK:  Carotids are without bruit.  No lymphadenopathy, thyromegaly, JVP  elevation.  LUNGS:  Lungs demonstrated rhonchi and wheezes.  HEART:  S1 and S2 are normal and diaphragmatic motion is normal.  Normal  heart sounds.  PMI normal.  RV is not palpable.  She does have a soft  systolic flow murmur.  ABDOMEN:  Benign.  Bowel sounds positive.  No AAA,  no tenderness, no  bruit.  No hepatosplenomegaly or reflux.  Aortobifemoral scar is healing  well.  PTs are +2 bilaterally.  No lower extremity edema.  NEURO:  Nonfocal.  SKIN:  Warm and dry.  MUSCULOSKELETAL:  No muscular weakness.   Baseline EKG is normal.   IMPRESSION:  1. Mild to moderate right ventricular cavity enlargement with a      question of a patent foramen ovale.  The patient will return for      bubble study.  2. Chronic obstructive pulmonary disease with continued smoking.  The      patient needs to stop.  She declined Chantix at this time.      Encouraged her to follow up.  We tried to avoid nicotine patches      given her coronary disease.  I suspect that the etiology to her      right ventricular enlargement is from chronic obstructive pulmonary      disease.  3. Hypertension, currently well controlled.  Continue low-sodium diet      and lisinopril.  4. Hyperlipidemia in the setting of coronary disease.  Continue      Lipitor.  Lipid and liver profile in 6 months.  5. Coronary artery disease.  Continue aspirin and beta-blocker.      Follow up Myoview in the year.  6. Peripheral vascular disease, status post aortobifemoral.  Follow  up      with Dr. Arbie Cookey.  Postoperative ABIs normal with total relief of      symptoms.  I explained to her that if she continues smoking, her      claudication will return.  Her EKG today was reviewed and is      totally normal.     Theron Arista C. Eden Emms, MD, [facility]  Electronically Signed    PCN/MedQ  DD: 08/23/2008  DT: 08/24/2008  Job #: [id number]

## 2010-10-29 NOTE — Assessment & Plan Note (Signed)
OFFICE VISIT   ROCHELLA, BENNER B  DOB:  12/10/1950                                       05/25/2009  EAVWU#:[id number]   The patient is a 60 year old woman who returns to clinic for a 6 month  evaluation of the right groin mass which was seen on recent CT scan.  She had undergone aortobifemoral bypass graft in 05/02/2009 and had done  well.  She was first evaluated for this mass on 12/20/2008.  She now  returns for followup.  On return, she states that she is not having any  difficulties with the mass.  She has had no pain in her groin.  She has  had no claudication symptoms in her lower extremities.  Her diabetes,  hypertension, cholesterol and coronary artery disease are all remaining  stable.  She is not taking insulin at this time for her diabetes.   FAMILY HISTORY:  Significant for coronary artery disease.   SOCIAL HISTORY:  She is married with 3 children and a grandchild.  She  still smokes cigarettes.   REVIEW OF SYSTEMS:  Significant only for pain in her feet when she is  lying flat.   PHYSICAL FINDINGS:  General:  Revealed a well-nourished, well-groomed  woman in no distress.  Vital signs:  Heart rate was 78, blood pressure  was 111/78.  O2 saturations 97%.  Temperature was 97.9.  HEENT:  [place]/AT.  Sclerae were not icteric.  Mucous membranes were pink and moist.  There  was no jugular venous distention appreciated.  Her trachea was midline.  No carotid bruits were appreciated.  Lungs:  Clear to auscultation.  Cardiac:  Revealed a regular rate and rhythm without murmurs, rubs or  gallops.  Abdomen:  Was soft, nontender, nondistended.  There was a well-  healed abdominal incision.  Her groin wounds were well-healed.  Musculoskeletal:  There were no major deformities in her musculoskeletal  system.  Neurologic:  Exam was nonfocal.  The right groin was unchanged  in comparison to the left groin.   LABORATORY EVALUATION:  There was a right groin  hematoma noted with  measurement of 3.05 x 3.68 without flow in the hematoma.  The right  common femoral artery, femoral artery and profunda arteries were patent  with flow.  ABIs were normal bilaterally.   ASSESSMENT AND PLAN:  This is a very pleasant woman who has had a very  good result from her aortobifemoral bypass grafting.  She will need  continuing followup for her graft and peripheral vascular disease.  We  will do this per our protocol.  The right groin hematoma remains stable.  This will hopefully not be of concern in the future.  Her diabetes,  blood pressure and cholesterol all remain stable and we will leave those  to the care of her respective physicians.   Wilmon Arms, PA   Larina Earthly, M.D.  Electronically Signed   KEL/MEDQ  D:  05/25/2009  T:  05/26/2009  Job:  [id number]

## 2010-11-01 NOTE — Assessment & Plan Note (Signed)
[facility] OFFICE NOTE   NAME:Tonkinson, DEANNE BEDGOOD                        MRN:          [id number]  DATE:05/20/2006                            DOB:          05-Sep-1950    CHIEF COMPLAINT:  Re-establish.  Needs medications.  See below.   HISTORY OF PRESENT ILLNESS:  Mrs. Collet is a 60 year old, half pack per  day smoker, who has a history of type 2 diabetes, hypertension, and  previous myocardial infarction who I have cared for a number of years  ago but had not been in this practice for awhile apparently because she  had no insurance and could not afford to come.  However, in that time  she did try to maintain on 20 mg of Lipitor by borrowing and getting  samples elsewhere.  However, her family has recently come under  insurance and she has made an appointment with [place] again.  Her old chart  is not available to [place] today.   Problem #1.  Type 2 diabetes.  She states her blood sugars are about  158, rising a good bit but she is really quite physically active and  tries to eat right.  She is not on medication for her blood sugar.   Problem #2.  Hypertension.  She has not checked her readings recently.  She is out of Metoprolol 25 mg one-half b.i.d. which she had been on  before.  We do not remember if she has ever been on an ACE inhibitor or  had a side effect.   Problem #3. Coronary artery disease.  The last time she saw Dr. Eden Emms  was in 2002.  She believes she has had two stress tests since her heart  attack but none in the last four to five years.  No chest pain,  shortness of breath, exercise intolerance related to cardiac symptoms.  She does take aspirin on a regular basis, 325 mg.   PAST MEDICAL HISTORY:  1. See data base.  2. Type 2 diabetes mellitus.  3. Heart attack in July 2000.  4. History of glaucoma symptoms.  She does wear glasses and get eyes      checked.  5. High cholesterol readings.  6.  Hypertension.  7. She had a hysterectomy in 1985 for endometriosis.  8. Last Pap smear in 2000.  She is gravida 4, para 3.  Mammogram 2000.      Last menstrual period in 1985.  9. Pneumovax in 2000.  10.She has never had colonoscopy.  11.She has had a history of decreased circulation in the left lower      extremity and I believe an abnormal ABI in the remote past.   MEDICATIONS:  1. Aspirin 325 mg once a day.  2. Lipitor 20 mg once a day.  3. Out of Metoprolol 50 mg p.o. b.i.d.   DRUG ALLERGIES:  None recorded.   FAMILY HISTORY:  Positive for type 2 diabetes in parents.  Hypertension  and hyperlipidemia.  Breast cancer.  See data base.   SOCIAL HISTORY:  __________.  Lives with husband.  No pets.  10 hours of  sleep per night.  No alcohol.  Down to nine cigarettes a day.  Some  caffeine.  Is physically active taking care of two grandchildren, ages 33  and 82 months of age.   REVIEW OF SYSTEMS:  Noted for chest pain, shortness of breath.  She does  have some mild circulation symptoms of her left lower extremity with  pain, cramps on activity.  She has no neurologic signs, pain, numbness  in her feet nor swelling or signs of heart failure.   PHYSICAL EXAMINATION:  VITAL SIGNS:  Height 5 feet 3-1/2 inches.  Weight  154, pulse 66 and regular.  Blood pressure 150/90; on repeat 155/90  right arm sitting.  GENERAL:  This is a well-developed, well-nourished, healthy, married  middle-aged lady in no acute distress.  NECK:  Supple without mass, thyromegaly or bruit.  CHEST:  CTAB, equal.  CARDIAC:  S1 and S2.  There is a short, systolic murmur in the left  upper sternal border. Otherwise nonradiating in the upright position.  Peripheral pulses are present without delay except a left femoral bruit  and a +1 pulse in the left DP.  SKIN:  There are no skin changes.  ABDOMEN:  Soft without megaly, guarding or rebound.  NEUROLOGIC:  Grossly intact.  Sensation in her foot is normal to 10-   gauge probe.   IMPRESSION:  Problem #1.  Hypertension.  Problem #2.  Type 2 diabetes.  Problem #3.  Coronary artery disease.  Problem #4.  Tobacco dependence.  Problem #5.  Left lower extremity bruit __________ decreased pulse left  lower extremity consistent with her peripheral vascular disease.   ASSESSMENT:  She has been off many of her medicines but has tried well  to maintain and we will try getting her blood pressure down and then get  her on Lipitor and discuss high dose Lipitor of 80 mg and samples given  of 40s today and prescription for 80 mg.  Will check labs in six to  eight weeks which will include her lipids, hemoglobin A1C, BNP, etc.  Prescription given for both Toprol-XL 50 mg and Metoprolol 50 to take  half p.o. b.i.d. to see which is generic.  If the XL is less expensive,  it will be easier to take.  I have also given her a prescription for  lisinopril 20 mg #30 with three pills in case Toprol or Metoprolol is  not effective in reducing her blood pressure in the next three to four  weeks.  I did tell her that she was probably going to need more than one  medication to control this.  I have also discussed the importance of  tobacco cessation especially in regard to peripheral vascular disease as  well as her CAD.  At some point in the future, she  may need to repeat Myoview and ABIs, etc., but at present will just  follow up after her labs and  aim for blood pressure and blood sugar control.  We will try to get her  old chart from storage.  She did get a flu shot today.     Neta Mends. Panosh, MD  Electronically Signed    WKP/MedQ  DD: 05/20/2006  DT: 05/21/2006  Job #: [id number]

## 2010-11-11 ENCOUNTER — Other Ambulatory Visit: Payer: Self-pay | Admitting: Internal Medicine

## 2010-12-03 ENCOUNTER — Other Ambulatory Visit: Payer: Self-pay | Admitting: Internal Medicine

## 2010-12-19 ENCOUNTER — Encounter: Payer: Self-pay | Admitting: Internal Medicine

## 2011-01-15 ENCOUNTER — Emergency Department (HOSPITAL_COMMUNITY): Payer: BC Managed Care – PPO

## 2011-01-15 ENCOUNTER — Inpatient Hospital Stay (HOSPITAL_COMMUNITY)
Admission: EM | Admit: 2011-01-15 | Discharge: 2011-01-26 | DRG: 107 | Disposition: A | Discharge status: Home / Self Care | Payer: BC Managed Care – PPO | Attending: Surgery | Admitting: Surgery

## 2011-01-15 DIAGNOSIS — J4489 Other specified chronic obstructive pulmonary disease: Secondary | ICD-10-CM | POA: Diagnosis present

## 2011-01-15 DIAGNOSIS — Z7982 Long term (current) use of aspirin: Secondary | ICD-10-CM

## 2011-01-15 DIAGNOSIS — D62 Acute posthemorrhagic anemia: Secondary | ICD-10-CM | POA: Diagnosis not present

## 2011-01-15 DIAGNOSIS — I10 Essential (primary) hypertension: Secondary | ICD-10-CM | POA: Diagnosis present

## 2011-01-15 DIAGNOSIS — R079 Chest pain, unspecified: Secondary | ICD-10-CM

## 2011-01-15 DIAGNOSIS — F17200 Nicotine dependence, unspecified, uncomplicated: Secondary | ICD-10-CM | POA: Diagnosis present

## 2011-01-15 DIAGNOSIS — I252 Old myocardial infarction: Secondary | ICD-10-CM

## 2011-01-15 DIAGNOSIS — I200 Unstable angina: Secondary | ICD-10-CM | POA: Diagnosis present

## 2011-01-15 DIAGNOSIS — E785 Hyperlipidemia, unspecified: Secondary | ICD-10-CM | POA: Diagnosis present

## 2011-01-15 DIAGNOSIS — Z8249 Family history of ischemic heart disease and other diseases of the circulatory system: Secondary | ICD-10-CM

## 2011-01-15 DIAGNOSIS — J449 Chronic obstructive pulmonary disease, unspecified: Secondary | ICD-10-CM | POA: Diagnosis present

## 2011-01-15 DIAGNOSIS — E119 Type 2 diabetes mellitus without complications: Secondary | ICD-10-CM | POA: Diagnosis present

## 2011-01-15 DIAGNOSIS — I2510 Atherosclerotic heart disease of native coronary artery without angina pectoris: Principal | ICD-10-CM | POA: Diagnosis present

## 2011-01-15 DIAGNOSIS — I739 Peripheral vascular disease, unspecified: Secondary | ICD-10-CM | POA: Diagnosis present

## 2011-01-15 LAB — TROPONIN I: Troponin I: <0.3 ng/mL (ref <0.30)

## 2011-01-15 LAB — CBC
Hemoglobin: 13.5 g/dL (ref 12.0–15.0)
Hemoglobin: 13 g/dL (ref 12.0–15.0)
MCH: 30.8 pg (ref 26.0–34.0)
MCH: 30.5 pg (ref 26.0–34.0)
MCHC: 34.4 g/dL (ref 30.0–36.0)
Platelets: 179 10*3/uL (ref 150–400)
RBC: 4.26 MIL/uL (ref 3.87–5.11)
WBC: 9.6 10*3/uL (ref 4.0–10.5)

## 2011-01-15 LAB — CK TOTAL AND CKMB (NOT AT ARMC)
CK, MB: 2.5 ng/mL (ref 0.3–4.0)
Relative Index: INVALID (ref 0.0–2.5)
Relative Index: INVALID (ref 0.0–2.5)
Total CK: 72 U/L (ref 7–177)

## 2011-01-15 LAB — BASIC METABOLIC PANEL
BUN: 19 mg/dL (ref 6–23)
CO2: 24 mEq/L (ref 19–32)
GFR calc non Af Amer: >60 mL/min (ref >60)
Glucose, Bld: 172 mg/dL — ABNORMAL HIGH (ref 70–99)
Potassium: 3.9 mEq/L (ref 3.5–5.1)

## 2011-01-15 LAB — DIFFERENTIAL
Basophils Relative: 0 % (ref 0–1)
Eosinophils Absolute: 0.3 10*3/uL (ref 0.0–0.7)
Monocytes Absolute: 0.5 10*3/uL (ref 0.1–1.0)
Monocytes Relative: 5 % (ref 3–12)

## 2011-01-15 LAB — GLUCOSE, CAPILLARY: Glucose-Capillary: 224 mg/dL — ABNORMAL HIGH (ref 70–99)

## 2011-01-15 LAB — PROTIME-INR: Prothrombin Time: 12.7 seconds (ref 11.6–15.2)

## 2011-01-16 DIAGNOSIS — I369 Nonrheumatic tricuspid valve disorder, unspecified: Secondary | ICD-10-CM

## 2011-01-16 LAB — CBC
HCT: 36.2 % (ref 36.0–46.0)
Hemoglobin: 12.2 g/dL (ref 12.0–15.0)
MCH: 30.6 pg (ref 26.0–34.0)
MCV: 90.7 fL (ref 78.0–100.0)
RBC: 3.99 MIL/uL (ref 3.87–5.11)
WBC: 5.9 10*3/uL (ref 4.0–10.5)

## 2011-01-16 LAB — LIPID PANEL
LDL Cholesterol: 80 mg/dL (ref 0–99)
Total CHOL/HDL Ratio: 7.1 RATIO
Triglycerides: 207 mg/dL — ABNORMAL HIGH (ref <150)
VLDL: 41 mg/dL — ABNORMAL HIGH (ref 0–40)

## 2011-01-16 LAB — BASIC METABOLIC PANEL
CO2: 24 mEq/L (ref 19–32)
Calcium: 9.4 mg/dL (ref 8.4–10.5)
Chloride: 108 mEq/L (ref 96–112)
Creatinine, Ser: 0.72 mg/dL (ref 0.50–1.10)
Glucose, Bld: 124 mg/dL — ABNORMAL HIGH (ref 70–99)

## 2011-01-16 LAB — HEMOGLOBIN A1C
Hgb A1c MFr Bld: 6.6 % — ABNORMAL HIGH (ref <5.7)
Mean Plasma Glucose: 143 mg/dL — ABNORMAL HIGH (ref <117)

## 2011-01-17 DIAGNOSIS — I2510 Atherosclerotic heart disease of native coronary artery without angina pectoris: Secondary | ICD-10-CM

## 2011-01-17 HISTORY — PX: CARDIAC CATHETERIZATION: SHX172

## 2011-01-17 LAB — CBC
MCV: 89.9 fL (ref 78.0–100.0)
Platelets: 145 10*3/uL — ABNORMAL LOW (ref 150–400)
RBC: 3.87 MIL/uL (ref 3.87–5.11)
RDW: 13.6 % (ref 11.5–15.5)
WBC: 5.1 10*3/uL (ref 4.0–10.5)

## 2011-01-17 LAB — GLUCOSE, CAPILLARY
Glucose-Capillary: 112 mg/dL — ABNORMAL HIGH (ref 70–99)
Glucose-Capillary: 119 mg/dL — ABNORMAL HIGH (ref 70–99)
Glucose-Capillary: 123 mg/dL — ABNORMAL HIGH (ref 70–99)

## 2011-01-17 LAB — HEPARIN LEVEL (UNFRACTIONATED): Heparin Unfractionated: 0.13 IU/mL — ABNORMAL LOW (ref 0.30–0.70)

## 2011-01-18 LAB — BASIC METABOLIC PANEL
BUN: 14 mg/dL (ref 6–23)
CO2: 25 mEq/L (ref 19–32)
Chloride: 108 mEq/L (ref 96–112)
GFR calc non Af Amer: >60 mL/min (ref >60)
Glucose, Bld: 133 mg/dL — ABNORMAL HIGH (ref 70–99)
Potassium: 4.2 mEq/L (ref 3.5–5.1)
Sodium: 142 mEq/L (ref 135–145)

## 2011-01-18 LAB — GLUCOSE, CAPILLARY
Glucose-Capillary: 117 mg/dL — ABNORMAL HIGH (ref 70–99)
Glucose-Capillary: 119 mg/dL — ABNORMAL HIGH (ref 70–99)
Glucose-Capillary: 94 mg/dL (ref 70–99)

## 2011-01-18 LAB — CBC
HCT: 37.9 % (ref 36.0–46.0)
Hemoglobin: 12.9 g/dL (ref 12.0–15.0)
RBC: 4.23 MIL/uL (ref 3.87–5.11)
WBC: 5.6 10*3/uL (ref 4.0–10.5)

## 2011-01-19 LAB — GLUCOSE, CAPILLARY
Glucose-Capillary: 139 mg/dL — ABNORMAL HIGH (ref 70–99)
Glucose-Capillary: 127 mg/dL — ABNORMAL HIGH (ref 70–99)
Glucose-Capillary: 129 mg/dL — ABNORMAL HIGH (ref 70–99)

## 2011-01-20 ENCOUNTER — Inpatient Hospital Stay (HOSPITAL_COMMUNITY): Payer: BC Managed Care – PPO

## 2011-01-20 DIAGNOSIS — I739 Peripheral vascular disease, unspecified: Secondary | ICD-10-CM

## 2011-01-20 DIAGNOSIS — I6529 Occlusion and stenosis of unspecified carotid artery: Secondary | ICD-10-CM

## 2011-01-20 DIAGNOSIS — I2510 Atherosclerotic heart disease of native coronary artery without angina pectoris: Secondary | ICD-10-CM

## 2011-01-20 LAB — GLUCOSE, CAPILLARY
Glucose-Capillary: 147 mg/dL — ABNORMAL HIGH (ref 70–99)
Glucose-Capillary: 123 mg/dL — ABNORMAL HIGH (ref 70–99)

## 2011-01-21 ENCOUNTER — Inpatient Hospital Stay (HOSPITAL_COMMUNITY): Payer: BC Managed Care – PPO

## 2011-01-21 LAB — CBC
Hemoglobin: 14.3 g/dL (ref 12.0–15.0)
MCH: 31.4 pg (ref 26.0–34.0)
MCHC: 35 g/dL (ref 30.0–36.0)
Platelets: 196 10*3/uL (ref 150–400)
RDW: 13.2 % (ref 11.5–15.5)

## 2011-01-21 LAB — COMPREHENSIVE METABOLIC PANEL
Albumin: 3.9 g/dL (ref 3.5–5.2)
BUN: 25 mg/dL — ABNORMAL HIGH (ref 6–23)
Calcium: 11 mg/dL — ABNORMAL HIGH (ref 8.4–10.5)
Creatinine, Ser: 0.74 mg/dL (ref 0.50–1.10)
GFR calc Af Amer: >60 mL/min (ref >60)
Glucose, Bld: 94 mg/dL (ref 70–99)
Potassium: 5.3 mEq/L — ABNORMAL HIGH (ref 3.5–5.1)
Total Protein: 7.6 g/dL (ref 6.0–8.3)

## 2011-01-21 LAB — GLUCOSE, CAPILLARY
Glucose-Capillary: 127 mg/dL — ABNORMAL HIGH (ref 70–99)
Glucose-Capillary: 103 mg/dL — ABNORMAL HIGH (ref 70–99)
Glucose-Capillary: 132 mg/dL — ABNORMAL HIGH (ref 70–99)
Glucose-Capillary: 124 mg/dL — ABNORMAL HIGH (ref 70–99)

## 2011-01-21 LAB — PROTIME-INR
INR: 0.98 (ref 0.00–1.49)
Prothrombin Time: 13.2 seconds (ref 11.6–15.2)

## 2011-01-21 LAB — BLOOD GAS, ARTERIAL
Acid-base deficit: 2.2 mmol/L — ABNORMAL HIGH (ref 0.0–2.0)
Bicarbonate: 21.7 mEq/L (ref 20.0–24.0)
O2 Content: 0.2 L/min
O2 Saturation: 97.2 %
pCO2 arterial: 34.7 mmHg — ABNORMAL LOW (ref 35.0–45.0)
pO2, Arterial: 86.3 mmHg (ref 80.0–100.0)

## 2011-01-21 LAB — BASIC METABOLIC PANEL
CO2: 26 mEq/L (ref 19–32)
Calcium: 10.6 mg/dL — ABNORMAL HIGH (ref 8.4–10.5)
Chloride: 101 mEq/L (ref 96–112)
Glucose, Bld: 85 mg/dL (ref 70–99)
Potassium: 4 mEq/L (ref 3.5–5.1)
Sodium: 139 mEq/L (ref 135–145)

## 2011-01-21 LAB — TYPE AND SCREEN
ABO/RH(D): A POS
Antibody Screen: NEGATIVE

## 2011-01-21 LAB — HEMOGLOBIN A1C: Mean Plasma Glucose: 148 mg/dL — ABNORMAL HIGH (ref <117)

## 2011-01-22 ENCOUNTER — Inpatient Hospital Stay (HOSPITAL_COMMUNITY): Payer: BC Managed Care – PPO

## 2011-01-22 DIAGNOSIS — I2510 Atherosclerotic heart disease of native coronary artery without angina pectoris: Secondary | ICD-10-CM

## 2011-01-22 HISTORY — PX: OTHER SURGICAL HISTORY: SHX169

## 2011-01-22 LAB — CBC
HCT: 28.4 % — ABNORMAL LOW (ref 36.0–46.0)
Hemoglobin: 9.6 g/dL — ABNORMAL LOW (ref 12.0–15.0)
MCH: 30.5 pg (ref 26.0–34.0)
MCH: 30 pg (ref 26.0–34.0)
MCHC: 34.7 g/dL (ref 30.0–36.0)
MCHC: 33.8 g/dL (ref 30.0–36.0)
MCV: 88.8 fL (ref 78.0–100.0)
RBC: 3.08 MIL/uL — ABNORMAL LOW (ref 3.87–5.11)
RDW: 13.2 % (ref 11.5–15.5)
WBC: 6.1 10*3/uL (ref 4.0–10.5)

## 2011-01-22 LAB — POCT I-STAT, CHEM 8
Chloride: 108 mEq/L (ref 96–112)
Creatinine, Ser: 0.6 mg/dL (ref 0.50–1.10)
Glucose, Bld: 134 mg/dL — ABNORMAL HIGH (ref 70–99)
Potassium: 3.9 mEq/L (ref 3.5–5.1)

## 2011-01-22 LAB — PROTIME-INR: Prothrombin Time: 17.2 seconds — ABNORMAL HIGH (ref 11.6–15.2)

## 2011-01-22 LAB — POCT I-STAT 4, (NA,K, GLUC, HGB,HCT)
Glucose, Bld: 135 mg/dL — ABNORMAL HIGH (ref 70–99)
Glucose, Bld: 139 mg/dL — ABNORMAL HIGH (ref 70–99)
Glucose, Bld: 127 mg/dL — ABNORMAL HIGH (ref 70–99)
HCT: 34 % — ABNORMAL LOW (ref 36.0–46.0)
HCT: 24 % — ABNORMAL LOW (ref 36.0–46.0)
HCT: 26 % — ABNORMAL LOW (ref 36.0–46.0)
Hemoglobin: 8.2 g/dL — ABNORMAL LOW (ref 12.0–15.0)
Hemoglobin: 8.8 g/dL — ABNORMAL LOW (ref 12.0–15.0)
Hemoglobin: 9.5 g/dL — ABNORMAL LOW (ref 12.0–15.0)
Potassium: 4 mEq/L (ref 3.5–5.1)
Potassium: 4.1 mEq/L (ref 3.5–5.1)
Potassium: 5.9 mEq/L — ABNORMAL HIGH (ref 3.5–5.1)
Sodium: 132 mEq/L — ABNORMAL LOW (ref 135–145)
Sodium: 134 mEq/L — ABNORMAL LOW (ref 135–145)

## 2011-01-22 LAB — POCT I-STAT 3, ART BLOOD GAS (G3+)
Acid-base deficit: 2 mmol/L (ref 0.0–2.0)
Acid-base deficit: 2 mmol/L (ref 0.0–2.0)
Acid-base deficit: 4 mmol/L — ABNORMAL HIGH (ref 0.0–2.0)
Bicarbonate: 23.1 mEq/L (ref 20.0–24.0)
Bicarbonate: 22 mEq/L (ref 20.0–24.0)
O2 Saturation: 100 %
O2 Saturation: 97 %
TCO2: 23 mmol/L (ref 0–100)
TCO2: 23 mmol/L (ref 0–100)
pCO2 arterial: 42.8 mmHg (ref 35.0–45.0)
pCO2 arterial: 44.2 mmHg (ref 35.0–45.0)
pH, Arterial: 7.31 — ABNORMAL LOW (ref 7.350–7.400)
pO2, Arterial: 340 mmHg — ABNORMAL HIGH (ref 80.0–100.0)
pO2, Arterial: 283 mmHg — ABNORMAL HIGH (ref 80.0–100.0)
pO2, Arterial: 99 mmHg (ref 80.0–100.0)

## 2011-01-22 LAB — URINE MICROSCOPIC-ADD ON

## 2011-01-22 LAB — URINALYSIS, ROUTINE W REFLEX MICROSCOPIC
Bilirubin Urine: NEGATIVE
Hgb urine dipstick: NEGATIVE
Ketones, ur: NEGATIVE mg/dL
Nitrite: NEGATIVE
Specific Gravity, Urine: 1.013 (ref 1.005–1.030)
Urobilinogen, UA: 0.2 mg/dL (ref 0.0–1.0)

## 2011-01-22 LAB — BASIC METABOLIC PANEL
Chloride: 105 mEq/L (ref 96–112)
GFR calc Af Amer: >60 mL/min (ref >60)
GFR calc non Af Amer: >60 mL/min (ref >60)
Glucose, Bld: 129 mg/dL — ABNORMAL HIGH (ref 70–99)
Potassium: 4.2 mEq/L (ref 3.5–5.1)
Sodium: 138 mEq/L (ref 135–145)

## 2011-01-22 LAB — POCT I-STAT 3, VENOUS BLOOD GAS (G3P V)
pCO2, Ven: 40 mmHg — ABNORMAL LOW (ref 45.0–50.0)
pH, Ven: 7.336 — ABNORMAL HIGH (ref 7.250–7.300)
pO2, Ven: 45 mmHg (ref 30.0–45.0)

## 2011-01-22 LAB — PLATELET COUNT: Platelets: 100 10*3/uL — ABNORMAL LOW (ref 150–400)

## 2011-01-23 ENCOUNTER — Inpatient Hospital Stay (HOSPITAL_COMMUNITY): Payer: BC Managed Care – PPO

## 2011-01-23 DIAGNOSIS — E1165 Type 2 diabetes mellitus with hyperglycemia: Secondary | ICD-10-CM

## 2011-01-23 LAB — CBC
HCT: 25.5 % — ABNORMAL LOW (ref 36.0–46.0)
HCT: 24.3 % — ABNORMAL LOW (ref 36.0–46.0)
Hemoglobin: 8.8 g/dL — ABNORMAL LOW (ref 12.0–15.0)
Hemoglobin: 8.3 g/dL — ABNORMAL LOW (ref 12.0–15.0)
MCH: 30.9 pg (ref 26.0–34.0)
MCHC: 34.2 g/dL (ref 30.0–36.0)
MCV: 88.5 fL (ref 78.0–100.0)
MCV: 90.3 fL (ref 78.0–100.0)
Platelets: 114 10*3/uL — ABNORMAL LOW (ref 150–400)
RBC: 2.88 MIL/uL — ABNORMAL LOW (ref 3.87–5.11)
WBC: 12.4 10*3/uL — ABNORMAL HIGH (ref 4.0–10.5)

## 2011-01-23 LAB — POCT I-STAT, CHEM 8
BUN: 18 mg/dL (ref 6–23)
Calcium, Ion: 1.33 mmol/L — ABNORMAL HIGH (ref 1.12–1.32)
Chloride: 105 mEq/L (ref 96–112)
Creatinine, Ser: 0.7 mg/dL (ref 0.50–1.10)
Glucose, Bld: 172 mg/dL — ABNORMAL HIGH (ref 70–99)
Potassium: 3.7 mEq/L (ref 3.5–5.1)

## 2011-01-23 LAB — BASIC METABOLIC PANEL
BUN: 16 mg/dL (ref 6–23)
CO2: 25 mEq/L (ref 19–32)
Chloride: 108 mEq/L (ref 96–112)
Creatinine, Ser: 0.61 mg/dL (ref 0.50–1.10)

## 2011-01-23 LAB — GLUCOSE, CAPILLARY
Glucose-Capillary: 106 mg/dL — ABNORMAL HIGH (ref 70–99)
Glucose-Capillary: 108 mg/dL — ABNORMAL HIGH (ref 70–99)
Glucose-Capillary: 111 mg/dL — ABNORMAL HIGH (ref 70–99)
Glucose-Capillary: 112 mg/dL — ABNORMAL HIGH (ref 70–99)
Glucose-Capillary: 115 mg/dL — ABNORMAL HIGH (ref 70–99)
Glucose-Capillary: 123 mg/dL — ABNORMAL HIGH (ref 70–99)
Glucose-Capillary: 128 mg/dL — ABNORMAL HIGH (ref 70–99)
Glucose-Capillary: 130 mg/dL — ABNORMAL HIGH (ref 70–99)
Glucose-Capillary: 130 mg/dL — ABNORMAL HIGH (ref 70–99)
Glucose-Capillary: 141 mg/dL — ABNORMAL HIGH (ref 70–99)

## 2011-01-23 LAB — CREATININE, SERUM: GFR calc non Af Amer: >60 mL/min (ref >60)

## 2011-01-23 LAB — MAGNESIUM: Magnesium: 2.4 mg/dL (ref 1.5–2.5)

## 2011-01-24 ENCOUNTER — Inpatient Hospital Stay (HOSPITAL_COMMUNITY): Payer: BC Managed Care – PPO

## 2011-01-24 LAB — GLUCOSE, CAPILLARY
Glucose-Capillary: 119 mg/dL — ABNORMAL HIGH (ref 70–99)
Glucose-Capillary: 137 mg/dL — ABNORMAL HIGH (ref 70–99)
Glucose-Capillary: 138 mg/dL — ABNORMAL HIGH (ref 70–99)
Glucose-Capillary: 185 mg/dL — ABNORMAL HIGH (ref 70–99)
Glucose-Capillary: 140 mg/dL — ABNORMAL HIGH (ref 70–99)

## 2011-01-24 LAB — CBC
MCV: 91.5 fL (ref 78.0–100.0)
Platelets: 108 10*3/uL — ABNORMAL LOW (ref 150–400)
RDW: 14.3 % (ref 11.5–15.5)
WBC: 8.6 10*3/uL (ref 4.0–10.5)

## 2011-01-24 LAB — BASIC METABOLIC PANEL
Chloride: 105 mEq/L (ref 96–112)
Creatinine, Ser: 0.83 mg/dL (ref 0.50–1.10)
GFR calc Af Amer: >60 mL/min (ref >60)
Potassium: 5 mEq/L (ref 3.5–5.1)

## 2011-01-25 LAB — GLUCOSE, CAPILLARY
Glucose-Capillary: 95 mg/dL (ref 70–99)
Glucose-Capillary: 136 mg/dL — ABNORMAL HIGH (ref 70–99)

## 2011-01-25 LAB — BASIC METABOLIC PANEL
BUN: 19 mg/dL (ref 6–23)
Calcium: 9.4 mg/dL (ref 8.4–10.5)
Creatinine, Ser: 0.71 mg/dL (ref 0.50–1.10)
GFR calc Af Amer: >60 mL/min (ref >60)
GFR calc non Af Amer: >60 mL/min (ref >60)
Potassium: 4.2 mEq/L (ref 3.5–5.1)

## 2011-01-25 LAB — CBC
HCT: 25.2 % — ABNORMAL LOW (ref 36.0–46.0)
MCH: 30.4 pg (ref 26.0–34.0)
MCHC: 33.3 g/dL (ref 30.0–36.0)
MCV: 91.3 fL (ref 78.0–100.0)
RDW: 13.9 % (ref 11.5–15.5)

## 2011-01-26 LAB — GLUCOSE, CAPILLARY: Glucose-Capillary: 97 mg/dL (ref 70–99)

## 2011-01-29 NOTE — H&P (Signed)
[name], [name] NO.:  [id number]  MEDICAL RECORD NO.:  [id number]  LOCATION:  3311                         FACILITY:  [facility]  PHYSICIAN:  Vesta Mixer, M.D. DATE OF BIRTH:  [birth year]  DATE OF ADMISSION:  01/15/2011 DATE OF DISCHARGE:                             HISTORY & PHYSICAL   PRIMARY CARDIOLOGIST:  Theron Arista C. Eden Emms, MD, [facility]  PRIMARY CARE PROVIDER:  Neta Mends. Panosh, MD  CHIEF COMPLAINT:  Chest pain.  HISTORY OF PRESENT ILLNESS:  This is a 60 year old Caucasian female with known coronary artery disease, status post PCI to her circumflex in 2000 with residual 70% LAD stenosis, occluded RCA with collaterals, this is per office note report as the actual cath report is not available to review.  This is in the setting of a V-fib arrest.  The patient states that since that time, she has been doing well without complaints of chest pain.  She also has hypertension, hyperlipidemia, and diet- controlled diabetes mellitus as well as peripheral vascular disease. She underwent a stress test approximately a year ago that was normal per patient report.  She states that she has been in her usual state health until approximately 3 days ago.  Until that time, she has had increased stress.  Today after getting off the sun, she decided to lay down and had acute onset of left-sided chest pain.  The pain was a squeezing sensation with radiation to her right shoulder.  The pain increased to an 8/10 and therefore EMS was called.  She chewed aspirin.  Upon arrival, the EMS gave her 3 sublingual nitroglycerin with minimal relief.  In the emergency department, her EKG shows nonspecific ST-T wave changes with normal cardiac markers x1.  Her pain persisted and she was placed on a nitro drip.  The patient's chest pain is currently 3/10. She has had constant pain for approximately 3 hours at this time.  She states it is somewhat worse with deep inspiration.  Her pain is  unlike her prior myocardial infarction.  She denies associates shortness of breath, diaphoresis, or nausea and vomiting.  She also denies any recent fevers or chills, change in bowel habits or appetite.  The patient's vital signs are stable.  Cardiology was asked to admit the patient for further evaluation.  SOCIAL HISTORY:  The patient lives at home with her husband.  She helps baby-sit her grandchildren.  She denies alcohol or illicit drug use. She tries to eat a diabetic diet.  She continues to smoke one and half pack per day.  FAMILY HISTORY:  Pertinent for premature coronary artery disease in her father, with his first myocardial infarction in his 86s.  He passed away at age of 60 from myocardial infarction.  ALLERGIES:  No known drug allergies.  HOME MEDICATIONS: 1. Xanax 0.25 mg daily as needed. 2. Albuterol inhaled as needed. 3. Aspirin 325 mg daily. 4. Fenofibrate 200 mg daily. 5. Lipitor 80 mg daily. 6. Lisinopril 20 mg daily. 7. Toprol-XL 50 mg daily. 8. Fish oil daily. 9. Vitamin D 400 mg daily.  REVIEW OF SYSTEMS:  All pertinent positives and negatives as stated in the HPI.  Other  systems have been reviewed and are negative.  PHYSICAL EXAMINATION:  VITAL SIGNS:  Temperature 97.9, pulse 88, respirations 22, blood pressure 115/59, O2 saturation 98% on room air. GENERAL:  This is a well-developed, well-nourished middle-aged female. She appears older than her stated age.  She is in no acute distress. HEENT:  Normocephalic and atraumatic. NECK:  Supple without JVD. HEART:  Regular rate and with that rhythm.  S1 and S2.  There is a soft systolic murmur noted.  PMI is normal.  Pulses 2+ and equal bilaterally. LUNGS:  Clear to auscultation bilaterally without wheezes, rales or rhonchi. ABDOMEN: Soft, nontender, nondistended, positive bowel sounds x4. EXTREMITIES:  No clubbing, cyanosis or edema. MUSCULOSKELETAL:  No joint deformities or effusions. NEURO:  Alert and  oriented x3.  Cranial nerves II through XII grossly intact.  Chest x-ray showed no acute cardiopulmonary abnormalities.  EKG showing normal sinus rhythm at rate of 83 beats per minute.  There are nonspecific ST-T wave changes.  No Q-waves.  LABORATORY DATA:  WBC of 10, hemoglobin 13.5, hematocrit 39.2, platelets 179.  Sodium 141, potassium 3.9, BUN 19, creatinine 0.71.  Cardiac enzymes negative x1.  Glucose 172.  ASSESSMENT/PLAN:  This is a 60 year old Caucasian female with known coronary artery disease, status post percutaneous coronary intervention in 2000 with residual left anterior descending and occluded right, hypertension, hyperlipidemia, and continued tobacco abuse, who presents with chest pain that is concerning for unstable angina.  The patient's EKG is with nonspecific ST-T wave changes and her cardiac markers are negative x1.  At this time, the patient will be admitted to the step- down unit as she continues to have chest pain.  She will be continued on her nitroglycerin drip as well as heparin will be added.  The patient's cardiac markers will be cycled as well as EKGs.  We will use morphine as needed for pain control.  The patient will need cardiac catheterization in the morning.  Risks, benefits and indications have been discussed with the patient, and she agrees to proceed.  At this time, the patient will be continued on aspirin, Lipitor, lisinopril, and Toprol for her coronary artery disease.  We will check a hemoglobin A1c and fasting lipid panel with possible changes to her medications post results. Further treatment will be dependent upon the above.    Leonette Monarch, PA-C   ______________________________ Vesta Mixer, M.D.   NB/MEDQ  D:  01/15/2011  T:  01/16/2011  Job:  [id number]  Electronically Signed by Alen Blew P.A. on 01/17/2011 02:50:43 PM Electronically Signed by Kristeen Miss M.D. on 01/29/2011 05:37:36 PM

## 2011-02-03 NOTE — Discharge Summary (Signed)
Kimberly [name], CLERE NO.:  [id number]  MEDICAL RECORD NO.:  [id number]  LOCATION:  2032                         FACILITY:  [facility]  PHYSICIAN:  Evelene Croon, M.D.     DATE OF BIRTH:  10/25/1950  DATE OF ADMISSION:  01/15/2011 DATE OF DISCHARGE:  01/25/2011                              DISCHARGE SUMMARY   PRIMARY ADMITTING DIAGNOSIS:  Chest pain.  ADDITIONAL/DISCHARGE DIAGNOSES: 1. Severe three-vessel coronary artery disease. 2. Unstable angina. 3. Type 2 diabetes mellitus. 4. Hypertension. 5. Hyperlipidemia. 6. History of  coronary artery disease, status post myocardial     infarction with percutaneous coronary intervention of left     circumflex in 2000. 7. History of V-fib arrest in the past. 8. Peripheral vascular occlusive disease, status post aortobifemoral     bypass by Dr. Tawanna Cooler Early in 2008. 9. History of chronic obstructive pulmonary disease. 10.Ongoing tobacco abuse.  PROCEDURES PERFORMED: 1. Cardiac catheterization. 2. Coronary artery bypass grafting x3 (left internal mammary artery to     the LAD, saphenous vein graft to the obtuse marginal, saphenous     vein graft to the right coronary artery). 3. Endoscopic vein harvest, right leg.  HISTORY:  The patient is a 60 year old female with a known history of coronary artery disease.  She had previously sustained a myocardial infarction and underwent PCI to her circumflex in 2000 with residual 70% LAD stenosis and occluded right coronary artery with collaterals and history of a V-fib arrest at that time.  She has been in her usual state of health since that time and underwent a stress test approximately a year ago that was normal per report.  About 3 days prior to this admission, the patient began to develop worsening chest discomfort with exertion.  In the date of admission, she developed acute onset of left- sided chest pain at rest, which she rated at 8/10.  She was transported by EMS to  the emergency department at [facility] for further evaluation.  On route, she was treated with aspirin and sublingual nitroglycerin with minimal relief.  In the emergency department, her EKG showed nonspecific ST and T-wave changes, and she was started on nitroglycerin drip. Cardiology saw the patient, and she was admitted for further workup.  HOSPITAL COURSE:  Ms. Marsteller was admitted and did rule out for myocardial infarction.  Her symptoms did improve on nitroglycerin but did not resolve completely.  She was taken to the cath lab on January 17, 2011, and underwent cardiac catheterization by Dr. Elease Hashimoto, which showed severe three-vessel coronary artery disease.  The LAD had an 80% midvessel stenosis.  Overall long area, the left circumflex had a patent proximal stent with a 50-60% in-stent restenosis.  The right coronary artery was chronically occluded with filling of a distal small vessel by collaterals from the left.  Left ventricular ejection fraction was 65%. Given the patient's history and her severe three-vessel disease, it was felt that she would not be a good candidate for further percutaneous intervention.  Cardiac Surgery consult was obtained and the patient was seen by Dr. Evelene Croon.  After review of her films, Dr. Laneta Simmers agreed with the need for coronary  artery bypass grafting.  He explained all risks, benefits and alternatives of surgery to the patient.  She agreed to proceed.  She remained stable and had no further chest discomfort while awaiting surgery.  She was taken to the operating room on January 22, 2011, and underwent CABG x3 by Dr. Laneta Simmers as described above.  Please see previously dictated operative report for complete details of surgery.  She tolerated the procedure well and was transferred to the SICU in stable condition.  She was extubated shortly after surgery.  She was hemodynamically stable and doing well on postop day #1.  She initially did have some hypotension,  requiring phenylephrine drip but this was weaned and discontinued over the course of her first postoperative day.  Since that time, her blood pressures have remained low, normal and she has not been started on a beta-blocker secondary to blood pressures of 90-100 systolic.  Otherwise, she has done well postoperatively.  She was able to be transferred to the step-down unit on postop day #2.  She has been ambulating in the halls without problem and has been weaned from supplemental oxygen.  She has had mild postoperative volume overload and was started on Lasix, to which she is responding well.  She currently remains approximately 2 kg above her preoperative weight with trace lower extremity edema on physical exam. She has had mild postoperative blood loss anemia and was started on supplemental iron.  Overall, she is progressing well.  Her labs on postop day #3 show hemoglobin of 8.4, hematocrit 25.2, platelets 116, white count 6.3.  Sodium 138, potassium 4.2, BUN 19, creatinine 0.71. Because of her preoperative hemoglobin A1c is 6.8, she has been started on p.o. metformin and is asked to follow up with her primary care physician for further management of her diabetes, which had previously been diet controlled.  She is overall doing well and she has progressed without difficulty.  We expect discharge home on January 26, 2011.  DISCHARGE MEDICATIONS: 1. Ferrous gluconate 324 mg b.i.d. 2. Lasix 40 mg daily x3 additional days. 3. Metformin 500 mg b.i.d. 4. Oxycodone IR 5-10 mg q.3-4 h p.r.n. pain. 5. Albuterol inhaler 1-2 puffs q. 6 hours p.r.n. 6. Enteric-coated aspirin 325 mg daily. 7. Lipitor 80 mg daily. 8. Fenofibrate 200 mg daily. 9. Fish oil 1 capsule daily. 10.Vitamin D 400 units daily.  DISCHARGE INSTRUCTIONS:  She is asked to refrain from driving, heavy lifting or strenuous activity.  She may continue ambulating daily and using her incentive spirometer.  She may shower daily  and clean her incisions with soap and water.  She will continue low-fat, low-sodium carbohydrate modified diet.  DISCHARGE FOLLOWUP:  She will need to make an appointment to see Dr. Elease Hashimoto in 2 weeks.  She will then follow up in 3 weeks with Dr. Laneta Simmers with chest x-ray.  In the interim if she experiences any problems or has questions, she is asked to contact our office immediately.     Coral Ceo, P.A.   ______________________________ Evelene Croon, M.D.    GC/MEDQ  D:  01/26/2011  T:  01/26/2011  Job:  [id number]  cc:   Vesta Mixer, M.D. Neta Mends. Fabian Sharp, MD  Electronically Signed by Weldon Inches. on 01/31/2011 03:40:47 PM Electronically Signed by Evelene Croon M.D. on 02/03/2011 03:59:00 PM

## 2011-02-03 NOTE — Consult Note (Signed)
NAMECALLY, NYGARD NO.:  [id number]  MEDICAL RECORD NO.:  [id number]  LOCATION:  2004                         FACILITY:  [facility]  PHYSICIAN:  Evelene Croon, M.D.     DATE OF BIRTH:  June 28, 1950  DATE OF CONSULTATION:  01/20/2011 DATE OF DISCHARGE:                                CONSULTATION   REFERRING PHYSICIAN:  Dr. fell nausea.  REASON FOR CONSULTATION:  Three-vessel coronary disease, unstable angina.  CLINICAL HISTORY:  I was asked by Dr. Elease Hashimoto to evaluate Ms. Junkins for consideration of coronary artery bypass graft surgery.  She is a 60 year old white female smoker with a history of multiple cardiac risk factors and coronary disease status post stenting of her left circumflex in 2000.  She reports that she had normal stress test about 1 year ago and has continued to be asymptomatic until the day of presentation when she had acute onset of squeezing left-sided chest discomfort with radiation into her right shoulder.  She rated this as a 8/10 and called EMS.  She was given aspirin and sublingual nitroglycerin x3 with minimal relief. Her electrocardiogram showed nonspecific changes and her cardiac enzymes were negative.  On nitroglycerin drip, her symptoms improved but did not resolve.  Her symptoms were different than her previous myocardial infarction.  She reports that she was under some stress 3 days prior to this episode.  She did rule out for myocardial infarction.  Cardiac catheterization on January 17, 2011 showed 3-vessel coronary disease.  The LAD had 80% midvessel stenosis over a long area.  The left circumflex had a patent proximal stent with 50-60% in-stent restenosis.  It was slightly hazy.  There was a large marginal branch without significant narrowing.  The right coronary artery was chronically occluded with filling of a small distal vessel by collaterals from the left.  Left ventricular ejection fraction was 65%.  There was some discussion  about treatment of this and the options were presented to the patient of undergoing coronary bypass surgery or PCI.  Given her history of diabetes and 3-vessel disease, it was felt that surgery was probably better option and she agreed with surgical treatment.  REVIEW OF SYSTEMS:  GENERAL:  She denies any fever or chills.  She has had no recent weight changes.  She denies any fatigue and remains active.  EYES:  Negative.  ENT:  Negative.  ENDOCRINE:  She has borderline diet-controlled diabetes.  She says she does check her blood sugars.  She denies hypothyroidism.  CARDIOVASCULAR:  As above.  She denies exertional dyspnea.  She has had no PND or orthopnea.  She denies peripheral edema or palpitations.  RESPIRATORY:  She denies cough and sputum production.  GI:  She has had no nausea or vomiting.  She denies melena and bright red blood per rectum.  GU:  She denies dysuria and hematuria.  MUSCULOSKELETAL:  She denies arthralgias and myalgias. NEUROLOGICAL:  She denies any focal weakness or numbness.  She denies dizziness and syncope.  She has never had TIA or stroke.  PSYCHIATRIC: Negative.  HEMATOLOGICAL:  Negative.  ALLERGIES:  None.  PAST MEDICAL HISTORY:  Significant for hypertension and  hyperlipidemia. She has diet-controlled diabetes.  She has a history of coronary disease status post myocardial infarction and PCI of the left circumflex in 2000.  She has a history of ventricular fibrillation arrest according to the chart.  She has history of peripheral vascular disease status post aortobifemoral bypass by Dr. Tawanna Cooler Early in 2008 for lower extremity claudication.  She has history of COPD.  MEDICATIONS PRIOR TO ADMISSION: 1. Xanax 0.25 mg p.r.n. 2. Albuterol. 3. Aspirin 325 mg daily. 4. Fenofibrate 200 mg daily. 5. Lipitor 80 mg daily. 6. Lisinopril 20 mg daily. 7. Toprol-XL 50 mg daily. 8. Fish oil daily. 9. Vitamin D 400 mg daily.  FAMILY HISTORY:  Father had myocardial  infarction in his 32s and died of MI at 68.  SOCIAL HISTORY:  She lives at home with her husband.  She is active, baby sitting her grandchildren and doing housework.  She smokes about 1- 1/2 pack of cigarettes per day and has for years.  She denies alcohol abuse.  PHYSICAL EXAMINATION:  VITAL SIGNS:  Blood pressure is 106/63, pulse is 60 and regular, respiratory rate is 18 and unlabored. GENERAL:  She is a well-developed white female who looks older than her stated age. HEENT:  Shows to be normocephalic and atraumatic.  Pupils are equal and reactive to light and accommodation.  Extraocular muscles are intact. Oropharynx is clear. NECK:  Shows normal carotid pulses bilaterally.  There are no bruits. There is no adenopathy or thyromegaly. CARDIAC:  Shows a regular rate and rhythm with normal S1 and S2.  There is no murmur, rub, or gallop. LUNGS:  Clear. ABDOMEN:  Shows active bowel sounds. ABDOMEN:  Soft and nontender.  There are no palpable masses or organomegaly.  There is a well-healed midline laparotomy scar. EXTREMITIES:  Shows no peripheral edema.  Pedal pulses are diminished but palpable bilaterally.  There are bilateral groin scars from her aortobifemoral bypass. NEUROLOGIC:  Shows to be alert and oriented x3.  Motor and sensory exams are grossly normal.  IMPRESSION:  Ms. Lear has significant 3-vessel coronary disease presenting with unstable anginal symptoms.  She is very active and I agree that coronary bypass graft surgery is the best treatment to prevent further ischemia and infarction and improve her quality of life and allow her to remain active.  I discussed the operation with her and her family including alternatives, benefits, and risks including but not limited to bleeding, blood transfusion, infection, stroke, myocardial infarction, graft failure, and death.  Also discussed the importance of maximum cardiac risk factor reduction including complete  smoking cessation.  She said she understands and plans to quit smoking.     Evelene Croon, M.D.    BB/MEDQ  D:  01/21/2011  T:  01/21/2011  Job:  [id number]  cc:   Vesta Mixer, M.D.  Electronically Signed by Evelene Croon M.D. on 02/03/2011 03:58:54 PM

## 2011-02-03 NOTE — Op Note (Signed)
Kimberly [name], SHILLER NO.:  [id number]  MEDICAL RECORD NO.:  [id number]  LOCATION:  2314                         FACILITY:  [facility]  PHYSICIAN:  Evelene Croon, M.D.     DATE OF BIRTH:  [birth year]  DATE OF PROCEDURE:  01/22/2011 DATE OF DISCHARGE:                              OPERATIVE REPORT   PREOPERATIVE DIAGNOSIS:  Severe three-vessel coronary artery disease with unstable angina.  POSTOPERATIVE DIAGNOSIS:  Severe three-vessel coronary artery disease with unstable angina.  OPERATIVE PROCEDURE:  Median sternotomy, extracorporeal circulation, coronary artery bypass graft surgery x3 using a left internal mammary artery graft to the left anterior descending coronary artery with a saphenous vein graft to the obtuse marginal branch of the left circumflex coronary artery, and a saphenous vein graft to the right coronary artery.  Endoscopic vein harvesting from the right leg.  ATTENDING SURGEON:  Evelene Croon, MD  ASSISTANT:  Doree Fudge, PA-C  ANESTHESIA:  General endotracheal.  CLINICAL HISTORY:  This patient is a 60 year old woman with history of diet-controlled diabetes and heavy smoking as well as vascular disease, status post aortobifemoral bypass in 2010, and lower extremity ischemia. She has a history of coronary artery disease and has stenting of the left circumflex in 2000 after myocardial infarction.  She was now admitted with chest pain and was felt to be angina.  Catheterization showed severe three-vessel coronary artery disease with a normal ejection fraction.  The LAD had an 80% stenosis after the diagonal branch, which was moderate size in length.  There was a patent left circumflex stent with about 50-60% in-stent restenosis.  This was not flow limiting, but was slightly hazy.  There was a small first and second marginal branches and a large third marginal branch.  The right coronary artery was occluded proximally with filling of the  distal vessel very faintly by collaterals from the left.  Left ventricular ejection fraction was 65%.  After review of the catheterization and examination of the patient, it was felt that coronary artery bypass graft surgery is the best treatment for this woman with three-vessel coronary artery disease and diabetes.  I discussed the operative procedure with her and her family including alternatives, benefits, and risks including but not limited to bleeding, blood transfusion, infection, stroke, myocardial infarction, graft failure, organ dysfunction, and death.  I also discussed the importance of maximum cardiac risk factor reduction including complete smoking cessation.  She said she understood and agreed to proceed.  OPERATIVE PROCEDURE:  The patient was taken to the operating room and placed on the table in supine position.  After induction of general endotracheal anesthesia, a Foley catheter was placed in the bladder using sterile technique.  Then, the chest, abdomen and both lower extremities were prepped and draped in usual sterile manner.  The chest was entered through a median sternotomy incision.  The pericardium was opened in the midline.  Examination of the heart showed good ventricular contractility.  The ascending aorta had no palpable plaques in it.  Then, the left internal mammary artery was harvested from the chest Voiles as a pedicle graft.  This was a medium caliber vessel with excellent blood flow through  it.  At the same time, segment of greater saphenous vein was harvested from the right leg using endoscopic vein harvest technique.  This vein was a medium size and good quality.  Then, the patient was heparinized when an adequate ACT was obtained. The distal ascending aorta was cannulated using a 20-French aortic cannula for arterial inflow.  Venous outflow was achieved using a two- stage venous cannula for the right atrial appendage.  An antegrade cardioplegia and  vent cannula was inserted in the aortic root.  The patient was placed on cardiopulmonary bypass and distal coronary arteries were identified.  The LAD was identified in its midportion and was a large graftable vessel.  There was minimal segmental disease distally.  The third obtuse marginal branch was the largest obtuse marginal branches and had mild distal disease and it was suitable for grafting.  The left circumflex terminated as a moderately large branch, which was essentially in the posterior descending.  The right coronary artery was diffusely diseased.  It was a nondominant vessel.  It gave off a small branch of the inferior Corniel.  Distally, the vessel was somewhat thickened, but had no calcified plaque in it and it was suitable for grafting.  It was relatively small.  Then, the aorta was crossclamped and 600 mL of cold blood antegrade cardioplegia was administered in the aortic root with quick arrest of the heart.  Systemic hypothermia to 32 degrees centigrade and topical hypothermia with iced saline was used.  A temperature probe was placed in the septum and insulating pad in the pericardium.  The first distal anastomosis was performed to the obtuse marginal branch.  The internal diameter was 1.75 mm.  The conduit used was a segment of greater saphenous vein and the anastomosis was performed in an end-to-side manner using continuous 7-0 Prolene suture.  Flow was noted through the graft and was excellent.  Second distal anastomosis was performed to distal right coronary artery. The internal diameter was about 1.5 to 1.6 mm.  The conduit used was a second segment of greater saphenous vein and anastomosis was performed in an end-to-side manner using continuous 7-0 Prolene suture.  Flow was noted through the graft and was excellent.  Then, another dose of cardioplegia was used down the vein grafts and in the aortic root.  The third distal anastomosis was performed to the  mid-LAD.  The internal diameter was about 2 mm.  The conduit used was a left internal mammary graft, it was brought through an opening of the left pericardium and anterior to the phrenic nerve.  It was anastomosed to the LAD in an end- to-side manner using continuous 8-0 Prolene suture.  The pedicle was sutured to the epicardium with 6-0 Prolene sutures.  The patient was then rewarmed to 37 degrees centigrade.  The two proximal vein graft anastomoses were performed to the mid ascending aorta in an end-to-side manner using continuous 6-0 Prolene suture.  Then, the clamp was removed from the mammary pedicle.  There was rapid warming of the ventricular septum and return of spontaneous ventricular fibrillation.  The crossclamp was removed with a time of 58 minutes and the patient was spontaneously converted to sinus rhythm.  The proximal and distal anastomoses appeared hemostatic and allowed the grafts satisfactory. Graft markers were placed around the proximal anastomoses.  Two temporary right ventricular and right atrial pacing wires were placed and brought out through the skin.  When the patient was rewarmed to 37 degrees centigrade, she was weaned from  cardiopulmonary bypass on no inotropic agents.  Total bypass time was 76 minutes.  Cardiac function appeared excellent and the cardiac output of 5 liters per minute.  Protamine was given, and the venous and aortic cannulas were removed without difficulty.  Hemostasis was achieved.  Three chest tubes were placed with tube in the posterior pericardium, one in the left pleural space, and one in the anterior mediastinum.  The sternum was closed with double #6 stainless steel wires.  The fascia was closed with continuous #1 Vicryl suture.  The subcutaneous tissue was closed with continuous 2-0 Vicryl and the skin with a 3-0 Vicryl subcuticular closure.  The lower extremity vein harvest site was closed in layers in a similar manner.  The  sponge, needle, and instrument counts were correct according to the scrub nurse.  Dry sterile dressing was applied over the incisions around the chest tubes, which were hooked to Pleur-Evac suction.  The patient remained hemodynamically stable and was transferred to the SICU in guarded, but stable condition.     Evelene Croon, M.D.     BB/MEDQ  D:  01/22/2011  T:  01/23/2011  Job:  [id number]  cc:   Vesta Mixer, M.D.  Electronically Signed by Evelene Croon M.D. on 02/03/2011 03:58:57 PM

## 2011-02-11 NOTE — Cardiovascular Report (Signed)
NAMESHEREECE, WELLBORN NO.:  [id number]  MEDICAL RECORD NO.:  [id number]  LOCATION:  2807                         FACILITY:  [facility]  PHYSICIAN:  Bevelyn Buckles. Bensimhon, MDDATE OF BIRTH:  [birth year]  DATE OF PROCEDURE:  01/17/2011 DATE OF DISCHARGE:                           CARDIAC CATHETERIZATION   PATIENT IDENTIFICATION:  Ms. Barth is a 60 year old woman with COPD and ongoing tobacco use.  She also has history of peripheral arterial disease and she is status post aortobifem bypass.  In 2000, she was admitted with angina and cath showed a totally occluded right coronary artery with left-to-right collaterals.  There was a high-grade lesion in the circumflex and a 70% lesion in the LAD.  She underwent stenting of the circumflex lesion, and the remainder of her coronary artery disease was treated medically.  She recently underwent a very emotionally stressful event and came in with chest pain.  Her cardiac enzymes and EKG were normal.  She was treated with IV nitroglycerin and heparin. She is brought today for cardiac catheterization.  PROCEDURES PERFORMED: 1. Selective coronary angiography. 2. Left heart catheterization. 3. Left ventriculogram.  DESCRIPTION OF PROCEDURE:  The risks and indications were explained. Consent was signed and placed on the chart.  After confirmation of a normal Allen test, the right wrist area was prepped and draped in routine sterile fashion and anesthetized with 1% local lidocaine.  A 5- French arterial sheath was placed in the right radial artery.  Using modified Seldinger technique, 3 mg of intra-arterial verapamil were given and 3500 units of heparin were given systemically.  Standard catheters including a JR-4, JL-3.5, and pigtail were used.  All catheter exchanges made over a wire with no apparent complications.  Central aortic pressure 139/74 with a mean of 102.  LV pressure was 141/8 with an EDP of 12.  There is no aortic  stenosis on pullback.  Left main was normal.  LAD was a long vessel coursing to the apex.  It is tortuous.  Gave off a diagonal in the midsection.  In the mid LAD, there was a long lesion beginning at the diagonal.  The beginning of the lesion was about 80% and throughout the remainder of the lesion was 60-70% stenosis.  Distal LAD had a 50% stenosis.  Left circumflex gave off multiple small marginal branches.  There was 1 moderate-sized marginal branch.  In the proximal left circumflex, there was a previous stent.  In the midsection, there was probably about 50- 60% in-stent restenosis, but there was good flow through the stent. There was a 30% lesion in the distal AV groove circ and a moderate OM. There was a 40% lesion in the midsection.  Right coronary artery was occluded proximally after the takeoff of the conus branch.  The conus branch fed some right-to-right collaterals to the RV branch.  The distal RCA filled through left-to-right collaterals.  Left ventriculogram done in the RAO position showed an EF of 65% with no regional Hazel motion abnormalities.  ASSESSMENT: 1. Three-vessel coronary artery disease as described above. 2. Normal left ventricular function.  PLAN/DISCUSSION:  I have reviewed the images with Dr. Riley Kill.  Given her normal  cardiac markers and morphology of her LAD lesion, it is not clear that this is ideal for PCI.  Dr. Riley Kill will review the options with her in depth this afternoon and make a final decision.     Bevelyn Buckles. Bensimhon, MD     DRB/MEDQ  D:  01/17/2011  T:  01/17/2011  Job:  [id number]  Electronically Signed by Arvilla Meres MD on 02/11/2011 05:25:30 PM

## 2011-02-12 ENCOUNTER — Encounter: Payer: Self-pay | Admitting: Cardiovascular Disease

## 2011-02-12 ENCOUNTER — Other Ambulatory Visit: Payer: Self-pay | Admitting: Surgery

## 2011-02-12 ENCOUNTER — Ambulatory Visit (INDEPENDENT_AMBULATORY_CARE_PROVIDER_SITE_OTHER): Payer: BC Managed Care – PPO | Admitting: Cardiovascular Disease

## 2011-02-12 DIAGNOSIS — I10 Essential (primary) hypertension: Secondary | ICD-10-CM

## 2011-02-12 DIAGNOSIS — I739 Peripheral vascular disease, unspecified: Secondary | ICD-10-CM

## 2011-02-12 DIAGNOSIS — I2510 Atherosclerotic heart disease of native coronary artery without angina pectoris: Secondary | ICD-10-CM

## 2011-02-12 DIAGNOSIS — R011 Cardiac murmur, unspecified: Secondary | ICD-10-CM

## 2011-02-12 NOTE — Assessment & Plan Note (Signed)
Has abstained since hospital D/C  Risk of recurrent vascular disease discussed at length

## 2011-02-12 NOTE — Assessment & Plan Note (Signed)
STable F/U Dr Arbie Cookey

## 2011-02-12 NOTE — Assessment & Plan Note (Signed)
Well controlled.  Continue current medications and low sodium Dash type diet.    

## 2011-02-12 NOTE — Progress Notes (Signed)
60 yo seen post hospital for CABG.  Previous CAD with stent to circumflex.  Smoker.  PVD with previous Aobifem.  Operated on by [facility]  CABG x3 using a left internal mammary   artery graft to the left anterior descending coronary artery with a   saphenous vein graft to the obtuse marginal branch of the left   circumflex coronary artery, and a saphenous vein graft to the right   coronary artery.  No problems since D/C Has stopped smoking for the time being.  Has appt with Bartle next week with CXR.  No arrhythmia, fever or edema.  Wounds healing well.  No SSCP.  Legs ok with baseline claudication.    ROS: Denies fever, malais, weight loss, blurry vision, decreased visual acuity, cough, sputum, SOB, hemoptysis, pleuritic pain, palpitaitons, heartburn, abdominal pain, melena, lower extremity edema, claudication, or rash.  All other systems reviewed and negative  General: Affect appropriate Healthy:  appears stated age HEENT: normal Neck supple with no adenopathy JVP normal no bruits no thyromegaly Lungs clear with no wheezing and good diaphragmatic motion Heart:  S1/S2 SEM  murmur,rub, gallop or click PMI normal Abdomen: benighn, BS positve, no tenderness, no AAA S/P aorto bifem  No HSM or HJR Distal pulses intact but diminished  with bilateral femoral bruits No edema Neuro non-focal Skin warm and dry No muscular weakness   Current Outpatient Prescriptions  Medication Sig Dispense Refill  . aspirin 325 MG EC tablet Take 325 mg by mouth daily.        Marland Kitchen atorvastatin (LIPITOR) 80 MG tablet TAKE 1 TABLET EVERY DAY  90 tablet  1  . Ergocalciferol (VITAMIN D2) 400 UNITS TABS Take by mouth.        . fenofibrate micronized (LOFIBRA) 200 MG capsule TAKE ONE CAPSULE BY MOUTH EVERY DAY  90 capsule  0  . ferrous gluconate (FERGON) 324 MG tablet Take 324 mg by mouth daily with breakfast.        . Omega-3 Fatty Acids (FISH OIL PO) Take by mouth.        . metoprolol (TOPROL-XL) 50 MG 24 hr tablet  TAKE 1 TABLET EVERY DAY  90 tablet  2    Allergies  Review of patient's allergies indicates no known allergies.  Electrocardiogram:  Assessment and Plan

## 2011-02-12 NOTE — Assessment & Plan Note (Signed)
Cholesterol is at goal.  Continue current dose of statin and diet Rx.  No myalgias or side effects.  F/U  LFT's in 6 months. Lab Results  Component Value Date   LDLCALC 80 01/16/2011

## 2011-02-12 NOTE — Patient Instructions (Signed)

## 2011-02-12 NOTE — Assessment & Plan Note (Signed)
Uncomplicated post CABG course with sternum healing well.  F/U CXR and appt with Bartle next week

## 2011-02-13 DIAGNOSIS — Z8679 Personal history of other diseases of the circulatory system: Secondary | ICD-10-CM

## 2011-02-13 DIAGNOSIS — I2510 Atherosclerotic heart disease of native coronary artery without angina pectoris: Secondary | ICD-10-CM

## 2011-02-13 DIAGNOSIS — J449 Chronic obstructive pulmonary disease, unspecified: Secondary | ICD-10-CM | POA: Insufficient documentation

## 2011-02-18 ENCOUNTER — Encounter: Payer: Self-pay | Admitting: Surgery

## 2011-02-18 ENCOUNTER — Ambulatory Visit
Admission: RE | Admit: 2011-02-18 | Discharge: 2011-02-18 | Disposition: A | Discharge status: Home / Self Care | Payer: BC Managed Care – PPO | Source: Ambulatory Visit | Attending: Surgery | Admitting: Surgery

## 2011-02-18 ENCOUNTER — Ambulatory Visit (INDEPENDENT_AMBULATORY_CARE_PROVIDER_SITE_OTHER): Payer: Self-pay | Admitting: Surgery

## 2011-02-18 ENCOUNTER — Ambulatory Visit: Payer: BC Managed Care – PPO | Admitting: Surgery

## 2011-02-18 VITALS — BP 136/80 | HR 80 | Resp 16 | Ht 64.0 in | Wt 150.0 lb

## 2011-02-18 DIAGNOSIS — Z951 Presence of aortocoronary bypass graft: Secondary | ICD-10-CM

## 2011-02-18 DIAGNOSIS — I2510 Atherosclerotic heart disease of native coronary artery without angina pectoris: Secondary | ICD-10-CM

## 2011-02-18 DIAGNOSIS — I4901 Ventricular fibrillation: Secondary | ICD-10-CM

## 2011-02-18 NOTE — Patient Instructions (Signed)
You may return to driving when you feel comfortable with that.  Do not lift anything heavier than 10 lbs for three months postoperatively. Return to see me if any problems develop with you incision; such as redness, swelling, or drainage.  

## 2011-02-21 ENCOUNTER — Encounter: Payer: Self-pay | Admitting: Surgery

## 2011-02-21 NOTE — Progress Notes (Signed)
  HPI: Patient returns for routine postoperative follow-up having undergone coronary bypass graft surgery x3 on 01/22/2011. The patient's early postoperative recovery while in the hospital was notable for an uncomplicated postoperative recovery. Since hospital discharge the patient reports she is walking short distances without chest pain or shortness of breath. She has smoked a few cigarettes since going home.   Current Outpatient Prescriptions  Medication Sig Dispense Refill  . aspirin 325 MG EC tablet Take 325 mg by mouth daily.        Marland Kitchen atorvastatin (LIPITOR) 80 MG tablet TAKE 1 TABLET EVERY DAY  90 tablet  1  . Ergocalciferol (VITAMIN D2) 400 UNITS TABS Take by mouth.        . fenofibrate micronized (LOFIBRA) 200 MG capsule TAKE ONE CAPSULE BY MOUTH EVERY DAY  90 capsule  0  . ferrous gluconate (FERGON) 324 MG tablet Take 324 mg by mouth daily with breakfast.        . Omega-3 Fatty Acids (FISH OIL PO) Take by mouth.        . metFORMIN (GLUCOPHAGE) 500 MG tablet 500 mg 2 (two) times daily with a meal.       . metoprolol (TOPROL-XL) 50 MG 24 hr tablet TAKE 1 TABLET EVERY DAY  90 tablet  2  . oxyCODONE (OXY IR/ROXICODONE) 5 MG immediate release tablet         Physical Exam: She looks well. Cardiac exam shows a regular rate and rhythm with normal heart sounds. Her lung exam is clear. The chest incision is healing well and the sternum is stable. Her leg incision is healing well and there is no peripheral edema.  Diagnostic Tests: Chest x-ray today shows clear lung fields and no pleural effusions.  Impression: Overall Kimberly [name] is recovering well from her surgery.  Plan: I saw her she could return to driving a car at this time but should refrain from lifting anything heavier than 10 pounds for a total of 3 months in the date of surgery. I encouraged her to continue abstaining from smoking. She will continue to followup with Dr. Leodis Sias and will contact me if she develops any  problems with her incisions.

## 2011-03-17 ENCOUNTER — Encounter: Payer: Self-pay | Admitting: Internal Medicine

## 2011-03-17 ENCOUNTER — Other Ambulatory Visit (INDEPENDENT_AMBULATORY_CARE_PROVIDER_SITE_OTHER): Payer: BC Managed Care – PPO

## 2011-03-17 DIAGNOSIS — Z0000 Encounter for general adult medical examination without abnormal findings: Secondary | ICD-10-CM

## 2011-03-17 LAB — CBC WITH DIFFERENTIAL/PLATELET
Basophils Absolute: 0 10*3/uL (ref 0.0–0.1)
Eosinophils Absolute: 0.4 10*3/uL (ref 0.0–0.7)
Lymphocytes Relative: 26.1 % (ref 12.0–46.0)
Lymphs Abs: 1.8 10*3/uL (ref 0.7–4.0)
Monocytes Relative: 5.6 % (ref 3.0–12.0)
Platelets: 181 10*3/uL (ref 150.0–400.0)
RDW: 15 % — ABNORMAL HIGH (ref 11.5–14.6)

## 2011-03-17 LAB — BASIC METABOLIC PANEL
BUN: 24 mg/dL — ABNORMAL HIGH (ref 6–23)
Calcium: 10.8 mg/dL — ABNORMAL HIGH (ref 8.4–10.5)
GFR: 60.27 mL/min (ref >60.00)
Glucose, Bld: 134 mg/dL — ABNORMAL HIGH (ref 70–99)

## 2011-03-17 LAB — POCT URINALYSIS DIPSTICK
Bilirubin, UA: NEGATIVE
Blood, UA: NEGATIVE
Glucose, UA: NEGATIVE
Ketones, UA: NEGATIVE
Spec Grav, UA: 1.01
Urobilinogen, UA: 0.2

## 2011-03-17 LAB — LDL CHOLESTEROL, DIRECT: Direct LDL: 119.8 mg/dL

## 2011-03-17 LAB — TSH: TSH: 4.56 u[IU]/mL (ref 0.35–5.50)

## 2011-03-17 LAB — HEPATIC FUNCTION PANEL
AST: 36 U/L (ref 0–37)
Alkaline Phosphatase: 70 U/L (ref 39–117)
Total Bilirubin: 0.4 mg/dL (ref 0.3–1.2)

## 2011-03-17 LAB — LIPID PANEL
HDL: 28.4 mg/dL — ABNORMAL LOW (ref >39.00)
Triglycerides: 236 mg/dL — ABNORMAL HIGH (ref 0.0–149.0)
VLDL: 47.2 mg/dL — ABNORMAL HIGH (ref 0.0–40.0)

## 2011-03-17 LAB — HEMOGLOBIN A1C: Hgb A1c MFr Bld: 5.8 % (ref 4.6–6.5)

## 2011-03-17 LAB — MICROALBUMIN / CREATININE URINE RATIO: Creatinine,U: 37.5 mg/dL

## 2011-03-19 ENCOUNTER — Other Ambulatory Visit: Payer: Self-pay | Admitting: Internal Medicine

## 2011-03-24 ENCOUNTER — Encounter: Payer: Self-pay | Admitting: Internal Medicine

## 2011-03-24 ENCOUNTER — Ambulatory Visit (INDEPENDENT_AMBULATORY_CARE_PROVIDER_SITE_OTHER): Payer: BC Managed Care – PPO | Admitting: Internal Medicine

## 2011-03-24 VITALS — BP 120/80 | HR 78 | Ht 63.5 in | Wt 153.0 lb

## 2011-03-24 DIAGNOSIS — Z0000 Encounter for general adult medical examination without abnormal findings: Secondary | ICD-10-CM

## 2011-03-24 DIAGNOSIS — I10 Essential (primary) hypertension: Secondary | ICD-10-CM

## 2011-03-24 DIAGNOSIS — E119 Type 2 diabetes mellitus without complications: Secondary | ICD-10-CM

## 2011-03-24 DIAGNOSIS — E785 Hyperlipidemia, unspecified: Secondary | ICD-10-CM

## 2011-03-24 DIAGNOSIS — Z23 Encounter for immunization: Secondary | ICD-10-CM

## 2011-03-24 DIAGNOSIS — I2510 Atherosclerotic heart disease of native coronary artery without angina pectoris: Secondary | ICD-10-CM

## 2011-03-24 DIAGNOSIS — Z87891 Personal history of nicotine dependence: Secondary | ICD-10-CM

## 2011-03-24 NOTE — Progress Notes (Signed)
Subjective:    Patient ID: Kimberly [name], female    DOB: Nov 26, 1950, 60 y.o.   MRN: [id number]  HPI Patient comes in today for preventive visit and follow-up of medical issues. Update of her history since her last visit. Had cabg x 5 on August   13   Had V fib  Currently doing well post op. TO limit lifting .  Went off all of her bp meds cause her bp was low . Is still on lipid meds  Declined mammo and colon.  Will do stool tests . No lumps or bleeding.  Stress had been taking care of Min L elderly and lifting Review of Systems ROS:  GEN/ HEENTNo fever, significant weight changes sweats headaches vision problems hearing changes, CV/ PULM; No chest pain  cough, syncope,edema  change in exercise tolerance. GI /GU: No adominal pain, vomiting, change in bowel habits. No blood in the stool. No significant GU symptoms. SKIN/HEME: ,no acute skin rashes suspicious lesions or bleeding. No lymphadenopathy, nodules, masses.  NEURO/ PSYCH:  No neurologic signs such as weakness numbness No depression anxiety. IMM/ Allergy: No unusual infections.  Allergy .   REST of 12 system review negative otherwise  hpi   Chest soreness from surgery.  Stress from prev situation no longer caretaking now  Cannot lift.   Past history family history social history reviewed in the electronic medical record.      Objective:   Physical Exam Physical Exam: Vital signs reviewed [id number] is a well-developed well-nourished alert cooperative  female who appears her stated age in no acute distress.  HEENT: normocephalic  traumatic , Eyes: PERRL EOM's full, conjunctiva clear, Nares: paten,t no deformity discharge or tenderness., Ears: no deformity EAC's clear TMs with normal landmarks. Mouth: clear OP, no lesions, edema.  Moist mucous membranes. Dentition in adequate repair. NECK: supple without masses, thyromegaly or bruits. Breast: normal by inspection . No dimpling, discharge, masses, tenderness or discharge  .  CHEST/PULM:  Clear to auscultation and percussion breath sounds equal no wheeze , rales or rhonchi. Well healing midlione scar chest  CV: PMI is nondisplaced, S1 S2 no gallops, murmurs, rubs. Peripheral pulses are full without delay.No JVD .  ABDOMEN: Bowel sounds normal nontender  No guard or rebound, no hepato splenomegal no CVA tenderness.  No hernia. Well healed scars noted  Extremtities:  No clubbing cyanosis or edema, no acute joint swelling or redness no focal atrophy NEURO:  Oriented x3, cranial nerves 3-12 appear to be intact, no obvious focal weakness,gait within normal limits no abnormal reflexes or asymmetrical SKIN: No acute rashes normal turgor, color, no bruising or petechiae. Feet no ulcers  Sensation intact  Some callus at right foot.  PSYCH: Oriented, good eye contact, no obvious depression anxiety, cognition and judgment appear normal. Lab Results  Component Value Date   WBC 6.7 03/17/2011   HGB 14.1 03/17/2011   HCT 43.7 03/17/2011   PLT 181.0 03/17/2011   GLUCOSE 134* 03/17/2011   CHOL 176 03/17/2011   TRIG 236.0* 03/17/2011   HDL 28.40* 03/17/2011   LDLDIRECT 119.8 03/17/2011   LDLCALC 80 01/16/2011   ALT 34 03/17/2011   AST 36 03/17/2011   NA 145 03/17/2011   K 5.1 03/17/2011   CL 107 03/17/2011   CREATININE 1.0 03/17/2011   BUN 24* 03/17/2011   CO2 27 03/17/2011   TSH 4.56 03/17/2011   INR 1.38 01/22/2011   HGBA1C 5.8 03/17/2011   MICROALBUR 1.0 03/17/2011  Assessment & Plan:  Preventive Health Care Declining colon and mammo   Stool tests given Flu shot today Tobacco Now stopped after surgery.  Encouraged tobacco free state CAD with recent CABG  Hx of PVD with bypass  HT is not taking any bp meds including ace and b blocker as stopped with low BP readings.   However m,ed record Per Dr Eden Emms says she is on this.  This is incorrect according to patient . hasn't been on b blocker since hospitalization. Dyslipidemia   Not at goal now   Disc options   On high does  atorva and lovasa ,     Intensify lifestyle interventions. Consider nutrition referral also want to hold on this for now  Consider whelcol and or niaspan if possible and stop  Or change the fibrate  Dm  fairly well controlled without  Microvasc complications at this time  Had se of metformin   Wants to continue lsi and we will  Follow  May need to add other meds . If needed but she is  Cautious about more meds. Hypercalcemia x 1  Hx of seem in  Seen by Dr Everardo All in the past but not in [facility]   Will review and recheck at fu.

## 2011-03-24 NOTE — Patient Instructions (Signed)
Congrats on tobacco free state!  Increase exercise  Avoid simple carbs and sweets Intensify lifestyle interventions. Check labs in 3 months and then follow up. Unsure why the calcium was elevated again but will recheck at next labs.  And review record,

## 2011-03-25 LAB — BLOOD GAS, ARTERIAL
Acid-Base Excess: 0
Bicarbonate: 23.3
Drawn by: 181601
FIO2: 0.21
O2 Saturation: 96.9
Patient temperature: 98.6
TCO2: 24.6
pCO2 arterial: 44.1
pCO2 arterial: 33.1 — ABNORMAL LOW
pH, Arterial: 7.342 — ABNORMAL LOW

## 2011-03-25 LAB — BASIC METABOLIC PANEL
BUN: 8
CO2: 23
Calcium: 8.2 — ABNORMAL LOW
Chloride: 110
Creatinine, Ser: 0.7
Creatinine, Ser: 0.74
GFR calc Af Amer: >60
GFR calc non Af Amer: >60
Glucose, Bld: 170 — ABNORMAL HIGH
Potassium: 3.5
Sodium: 138

## 2011-03-25 LAB — POCT I-STAT 7, (LYTES, BLD GAS, ICA,H+H)
Acid-base deficit: 3 — ABNORMAL HIGH
Calcium, Ion: 1.23
Calcium, Ion: 1.25
HCT: 33 — ABNORMAL LOW
Hemoglobin: 11.2 — ABNORMAL LOW
O2 Saturation: 100
Operator id: 119851
Potassium: 3.6
Potassium: 4.3
Sodium: 138
TCO2: 23
pCO2 arterial: 31.7 — ABNORMAL LOW
pH, Arterial: 7.385

## 2011-03-25 LAB — COMPREHENSIVE METABOLIC PANEL
ALT: 20
ALT: 34
AST: 25
AST: 28
Alkaline Phosphatase: 25 — ABNORMAL LOW
CO2: 22
Calcium: 8.2 — ABNORMAL LOW
Calcium: 10.5
Chloride: 105
GFR calc Af Amer: >60
GFR calc Af Amer: >60
GFR calc non Af Amer: >60
Glucose, Bld: 131 — ABNORMAL HIGH
Potassium: 3.5
Sodium: 132 — ABNORMAL LOW
Sodium: 138
Total Bilirubin: 0.6
Total Protein: 6.8

## 2011-03-25 LAB — CBC
HCT: 33 — ABNORMAL LOW
Hemoglobin: 9.8 — ABNORMAL LOW
Hemoglobin: 9.8 — ABNORMAL LOW
MCHC: 34.3
MCHC: 34.2
Platelets: 145 — ABNORMAL LOW
RBC: 3.1 — ABNORMAL LOW
RBC: 3.13 — ABNORMAL LOW
RBC: 4.37
RDW: 12.8
RDW: 13
WBC: 7.7
WBC: 8.4

## 2011-03-25 LAB — URINALYSIS, ROUTINE W REFLEX MICROSCOPIC
Bilirubin Urine: NEGATIVE
Glucose, UA: NEGATIVE
Hgb urine dipstick: NEGATIVE
Ketones, ur: NEGATIVE
pH: 6

## 2011-03-25 LAB — PROTIME-INR
INR: 1.3
Prothrombin Time: 16.6 — ABNORMAL HIGH

## 2011-03-25 LAB — ABO/RH: ABO/RH(D): A POS

## 2011-03-25 LAB — APTT: aPTT: 33

## 2011-03-25 LAB — MAGNESIUM: Magnesium: 1.5

## 2011-03-25 LAB — TYPE AND SCREEN

## 2011-03-28 ENCOUNTER — Encounter: Payer: Self-pay | Admitting: *Deleted

## 2011-03-28 ENCOUNTER — Other Ambulatory Visit (INDEPENDENT_AMBULATORY_CARE_PROVIDER_SITE_OTHER): Payer: BC Managed Care – PPO

## 2011-03-28 DIAGNOSIS — K921 Melena: Secondary | ICD-10-CM

## 2011-03-28 DIAGNOSIS — IMO0001 Reserved for inherently not codable concepts without codable children: Secondary | ICD-10-CM

## 2011-03-28 LAB — CBC
Hemoglobin: 14.4
RBC: 4.52
RDW: 13.7

## 2011-03-28 LAB — HEMOCCULT GUIAC POC 1CARD (OFFICE)
Card #2 Fecal Occult Blod, POC: NEGATIVE
Card #3 Fecal Occult Blood, POC: NEGATIVE

## 2011-03-28 LAB — COMPREHENSIVE METABOLIC PANEL
ALT: 38 — ABNORMAL HIGH
Alkaline Phosphatase: 60
Glucose, Bld: 98
Potassium: 5
Sodium: 139
Total Protein: 6.8

## 2011-03-28 LAB — PROTIME-INR: Prothrombin Time: 12.7

## 2011-03-29 DIAGNOSIS — Z0000 Encounter for general adult medical examination without abnormal findings: Secondary | ICD-10-CM | POA: Insufficient documentation

## 2011-03-29 DIAGNOSIS — Z87891 Personal history of nicotine dependence: Secondary | ICD-10-CM | POA: Insufficient documentation

## 2011-03-31 DIAGNOSIS — Z23 Encounter for immunization: Secondary | ICD-10-CM

## 2011-04-10 ENCOUNTER — Other Ambulatory Visit: Payer: Self-pay | Admitting: Internal Medicine

## 2011-06-19 ENCOUNTER — Other Ambulatory Visit: Payer: Self-pay | Admitting: Internal Medicine

## 2011-06-19 ENCOUNTER — Other Ambulatory Visit (INDEPENDENT_AMBULATORY_CARE_PROVIDER_SITE_OTHER): Payer: PRIVATE HEALTH INSURANCE

## 2011-06-19 DIAGNOSIS — E785 Hyperlipidemia, unspecified: Secondary | ICD-10-CM

## 2011-06-19 DIAGNOSIS — I10 Essential (primary) hypertension: Secondary | ICD-10-CM

## 2011-06-19 LAB — LIPID PANEL
Cholesterol: 173 mg/dL (ref 0–200)
Triglycerides: 202 mg/dL — ABNORMAL HIGH (ref 0.0–149.0)

## 2011-06-19 LAB — BASIC METABOLIC PANEL
Calcium: 10.3 mg/dL (ref 8.4–10.5)
Chloride: 107 mEq/L (ref 96–112)
Creatinine, Ser: 0.8 mg/dL (ref 0.4–1.2)
GFR: 74.66 mL/min (ref >60.00)

## 2011-06-19 LAB — LDL CHOLESTEROL, DIRECT: Direct LDL: 112.8 mg/dL

## 2011-06-20 LAB — PTH, INTACT AND CALCIUM
Calcium, Total (PTH): 10.5 mg/dL (ref 8.4–10.5)
PTH: 42.3 pg/mL (ref 14.0–72.0)

## 2011-06-26 ENCOUNTER — Encounter: Payer: Self-pay | Admitting: Internal Medicine

## 2011-06-26 ENCOUNTER — Ambulatory Visit (INDEPENDENT_AMBULATORY_CARE_PROVIDER_SITE_OTHER): Payer: PRIVATE HEALTH INSURANCE | Admitting: Internal Medicine

## 2011-06-26 ENCOUNTER — Other Ambulatory Visit: Payer: Self-pay | Admitting: Internal Medicine

## 2011-06-26 VITALS — BP 120/80 | HR 60 | Wt 156.0 lb

## 2011-06-26 DIAGNOSIS — E119 Type 2 diabetes mellitus without complications: Secondary | ICD-10-CM

## 2011-06-26 DIAGNOSIS — E785 Hyperlipidemia, unspecified: Secondary | ICD-10-CM

## 2011-06-26 DIAGNOSIS — I739 Peripheral vascular disease, unspecified: Secondary | ICD-10-CM

## 2011-06-26 DIAGNOSIS — I2510 Atherosclerotic heart disease of native coronary artery without angina pectoris: Secondary | ICD-10-CM

## 2011-06-26 MED ORDER — METFORMIN HCL ER 500 MG PO TB24: 250.0000 mg | ORAL_TABLET | Freq: Every day | ORAL | Status: DC

## 2011-06-26 NOTE — Patient Instructions (Addendum)
You sugars  Are higher  And Intensify lifestyle interventions.  Try again low dose metformin and  Call either way about if you tolerate this .  If can we should try 500 mg with the first meal of day.  And continue for 3 months.  Otherwise we may try welchol or [place] /tragenta or onglyza.   You calcium is better   Cholesterol not as good.

## 2011-06-26 NOTE — Progress Notes (Signed)
  Subjective:    Patient ID: Kimberly [name], female    DOB: 05-Sep-1950, 61 y.o.   MRN: [id number]  HPI Patient comes in today for followup of multiple medical issues. Since her last visit she has had no major changes in her health however she still has having great stress in the family with expectations to be caretaker for mother-in-law. However things have changed a bit to get some help she tries to walk and do yoga at this point. She does not think her diet has changed. She is still smoking 2 cigarettes a day. No change in medication. No chest pain shortness of breath recurrent claudication all service or numbness. No major changes in her vision. Lipids still taking medication CAD no change  DM no sweets no numbness syncope  Vision changes    Review of Systems As per history of present illness no fever vision change syncope. She is to followup with Dr. Eden Emms  with an echocardiogram in February  Past history family history social history reviewed in the electronic medical record.     Objective:   Physical Exam Well-developed well-nourished in no acute distress HEENT is grossly normal neck without masses do not hear a bruit Chest:  Clear to A&P without wheezes rales or rhonchi CV:  S1-S2 no gallops or murmurs peripheral perfusion is normal Abdomen:  Sof,t normal bowel sounds without hepatosplenomegaly, no guarding rebound or masses no CVA tenderness No clubbing cyanosis or edema No ulcers or acute rashes.  Lab Results  Component Value Date   WBC 6.7 03/17/2011   HGB 14.1 03/17/2011   HCT 43.7 03/17/2011   PLT 181.0 03/17/2011   GLUCOSE 152* 06/19/2011   CHOL 173 06/19/2011   TRIG 202.0* 06/19/2011   HDL 28.10* 06/19/2011   LDLDIRECT 112.8 06/19/2011   LDLCALC 80 01/16/2011   ALT 34 03/17/2011   AST 36 03/17/2011   NA 144 06/19/2011   K 3.8 06/19/2011   CL 107 06/19/2011   CREATININE 0.8 06/19/2011   BUN 20 06/19/2011   CO2 27 06/19/2011   TSH 4.56 03/17/2011   INR 1.38 01/22/2011   HGBA1C 7.1* 06/19/2011     MICROALBUR 1.0 03/17/2011         Assessment & Plan:  Diabetes   with deterioration in her A1c although it is not terribly out of control is now over 7. She attributes this to stress and time.  Discussed medications as well as lifestyle interventions she was put on high-dose metformin and could not tolerate it previously however is willing to try low dose extended release as a trial. If she is not able to tolerate this we will consider other medications including WelChol which we have discussed in the past which she decided not to take.  Lipids coronary disease her LDL is now over 100 although barely so again intensify lifestyle intervention we could consider WelChol because of her diabetes patient is aware  Hypercalcemia of unknown etiology improved.   Coronary artery disease peripheral vascular disease reports no symptoms today

## 2011-06-27 ENCOUNTER — Telehealth: Payer: Self-pay | Admitting: *Deleted

## 2011-06-27 NOTE — Telephone Encounter (Signed)
Pt is calling stating she cannot take the Metformin. It is making her nauseated with vomiting.

## 2011-06-27 NOTE — Telephone Encounter (Signed)
Tell her to just stop the metformin. Follow up with Dr. Fabian Sharp

## 2011-06-27 NOTE — Telephone Encounter (Signed)
Notified pt. 

## 2011-06-30 ENCOUNTER — Other Ambulatory Visit: Payer: Self-pay | Admitting: Internal Medicine

## 2011-06-30 NOTE — Telephone Encounter (Signed)
Pt can not take metformin due to n/v. Please advise. [facility] [address]

## 2011-06-30 NOTE — Telephone Encounter (Signed)
Tell her to stop the Metformin and follow up with Dr. Fabian Sharp next week

## 2011-06-30 NOTE — Telephone Encounter (Signed)
Spoke with pt

## 2011-07-03 ENCOUNTER — Encounter: Payer: Self-pay | Admitting: *Deleted

## 2011-07-08 ENCOUNTER — Other Ambulatory Visit: Payer: Self-pay | Admitting: Internal Medicine

## 2011-07-08 NOTE — Telephone Encounter (Signed)
i would like her to try the welchol and can give her samples   Take 1-2 po   Bid and if tolerated after a week then can  increase to 3 po bid .  ( agree with d/cing the metformin)

## 2011-07-08 NOTE — Telephone Encounter (Signed)
Pt stated she can not take metformin due to side effect ( nausea) [facility] [address]

## 2011-07-08 NOTE — Telephone Encounter (Signed)
Pt was advised on 06/27/11 by Dr. Clent Ridges to d/c metformin and follow up with Dr. Fabian Sharp- Please advise if you would like to start pt on something else or if you want pt to come in for a ov?

## 2011-07-09 NOTE — Telephone Encounter (Signed)
Patient is aware and samples are ready for pick up

## 2011-07-18 ENCOUNTER — Other Ambulatory Visit: Payer: Self-pay | Admitting: Internal Medicine

## 2011-07-18 NOTE — Telephone Encounter (Signed)
Was given samples of Welchol 625mg  3qam & 3qhs. She would like a [facility]. Thanks.

## 2011-07-21 MED ORDER — COLESEVELAM HCL 625 MG PO TABS: ORAL_TABLET | ORAL | Status: DC

## 2011-07-21 NOTE — Telephone Encounter (Signed)
Please send this in for 30 days with 6 refills  Disp 180 per month

## 2011-07-21 NOTE — Telephone Encounter (Signed)
Rx sent to pharmacy   

## 2011-08-11 ENCOUNTER — Ambulatory Visit (INDEPENDENT_AMBULATORY_CARE_PROVIDER_SITE_OTHER): Payer: PRIVATE HEALTH INSURANCE | Admitting: Cardiovascular Disease

## 2011-08-11 ENCOUNTER — Encounter: Payer: Self-pay | Admitting: Cardiovascular Disease

## 2011-08-11 ENCOUNTER — Other Ambulatory Visit: Payer: Self-pay

## 2011-08-11 ENCOUNTER — Ambulatory Visit (HOSPITAL_COMMUNITY): Payer: PRIVATE HEALTH INSURANCE | Attending: Cardiology

## 2011-08-11 DIAGNOSIS — J449 Chronic obstructive pulmonary disease, unspecified: Secondary | ICD-10-CM | POA: Insufficient documentation

## 2011-08-11 DIAGNOSIS — I739 Peripheral vascular disease, unspecified: Secondary | ICD-10-CM

## 2011-08-11 DIAGNOSIS — R0602 Shortness of breath: Secondary | ICD-10-CM

## 2011-08-11 DIAGNOSIS — I059 Rheumatic mitral valve disease, unspecified: Secondary | ICD-10-CM | POA: Insufficient documentation

## 2011-08-11 DIAGNOSIS — I2510 Atherosclerotic heart disease of native coronary artery without angina pectoris: Secondary | ICD-10-CM

## 2011-08-11 DIAGNOSIS — R011 Cardiac murmur, unspecified: Secondary | ICD-10-CM | POA: Insufficient documentation

## 2011-08-11 DIAGNOSIS — I10 Essential (primary) hypertension: Secondary | ICD-10-CM | POA: Insufficient documentation

## 2011-08-11 DIAGNOSIS — I379 Nonrheumatic pulmonary valve disorder, unspecified: Secondary | ICD-10-CM | POA: Insufficient documentation

## 2011-08-11 DIAGNOSIS — E119 Type 2 diabetes mellitus without complications: Secondary | ICD-10-CM | POA: Insufficient documentation

## 2011-08-11 DIAGNOSIS — Z951 Presence of aortocoronary bypass graft: Secondary | ICD-10-CM

## 2011-08-11 DIAGNOSIS — J4489 Other specified chronic obstructive pulmonary disease: Secondary | ICD-10-CM | POA: Insufficient documentation

## 2011-08-11 DIAGNOSIS — I079 Rheumatic tricuspid valve disease, unspecified: Secondary | ICD-10-CM | POA: Insufficient documentation

## 2011-08-11 MED ORDER — NITROGLYCERIN 0.4 MG SL SUBL: 0.4000 mg | SUBLINGUAL_TABLET | SUBLINGUAL | Status: DC | PRN

## 2011-08-11 NOTE — Assessment & Plan Note (Signed)
Needs PFT;s Reviewed echo from today.  Mild to moderate RA/RV enlargement normal EF  No pulmonary hypertension by TR velocity

## 2011-08-11 NOTE — Assessment & Plan Note (Signed)
Counseled for less than 10 minutes Suggest nicorette gum

## 2011-08-11 NOTE — Patient Instructions (Signed)
Your physician recommends that you schedule a follow-up appointment in: 3 MONTHS WITH DR [facility] Your physician recommends that you continue on your current medications as directed. Please refer to the Current Medication list given to you today. You have been referred to CARDIAC [facility] Your physician has recommended that you have a pulmonary function test. Pulmonary Function Tests are a group of tests that measure how well air moves in and out of your lungs. DX SHORTNESS OF BREATH

## 2011-08-11 NOTE — Progress Notes (Signed)
Patient ID: Kimberly [name], female   DOB: Jun 26, 1950, 61 y.o.   MRN: [id number] 61 yo seen 8/12  for CABG. Previous CAD with stent to circumflex. Smoker. PVD with previous Aobifem. Operated on by [facility]  CABG x3 using a left internal mammary  artery graft to the left anterior descending coronary artery with a  saphenous vein graft to the obtuse marginal branch of the left  circumflex coronary artery, and a saphenous vein graft to the right  coronary artery.   Smoking 2-4 cigs/day.  Counseled for less then 10 minutes on cessation Has tightness in her chest with minimal walking.  Not sure if it is bronchospasm or angina.  Needs nitro. No claudication.  No resting symptoms.  Filled out DMV form for her for handicapped placard  ROS: Denies fever, malais, weight loss, blurry vision, decreased visual acuity, cough, sputum, SOB, hemoptysis, pleuritic pain, palpitaitons, heartburn, abdominal pain, melena, lower extremity edema, claudication, or rash.  All other systems reviewed and negative  General: Affect appropriate Chronically ill COPD HEENT: normal Neck supple with no adenopathy JVP normal no bruits no thyromegaly Lungs clear with no wheezing and good diaphragmatic motion Heart:  S1/S2 SEM ? rub PMI normal Abdomen: benighn, BS positve, no tenderness, no AAA no bruit.  No HSM or HJR Distal pulses intact with no bruits No edema Neuro non-focal Skin warm and dry No muscular weakness   Current Outpatient Prescriptions  Medication Sig Dispense Refill  . albuterol (PROVENTIL HFA;VENTOLIN HFA) 108 (90 BASE) MCG/ACT inhaler Inhale 2 puffs into the lungs every 6 (six) hours as needed.        Marland Kitchen aspirin 325 MG EC tablet Take 325 mg by mouth daily.        Marland Kitchen atorvastatin (LIPITOR) 80 MG tablet TAKE 1 TABLET EVERY DAY  90 tablet  1  . colesevelam (WELCHOL) 625 MG tablet Take 3 every morning and 3 qhs.  180 tablet  0  . fenofibrate micronized (LOFIBRA) 200 MG capsule TAKE ONE CAPSULE BY MOUTH EVERY  DAY  90 capsule  0  . MULTIPLE VITAMIN PO Take by mouth. With iron      . Omega-3 Fatty Acids (FISH OIL PO) Take 1 tablet by mouth daily.       Marland Kitchen VITAMIN D, ERGOCALCIFEROL, PO Take 1 tablet by mouth daily.         Allergies  Metformin and related  Electrocardiogram:  Assessment and Plan

## 2011-08-11 NOTE — Assessment & Plan Note (Signed)
Well controlled.  Continue current medications and low sodium Dash type diet.    

## 2011-08-11 NOTE — Assessment & Plan Note (Signed)
Cholesterol is at goal.  Continue current dose of statin and diet Rx.  No myalgias or side effects.  F/U  LFT's in 6 months. Lab Results  Component Value Date   LDLCALC 80 01/16/2011             

## 2011-08-11 NOTE — Assessment & Plan Note (Signed)
Discussed low carb diet.  Target hemoglobin A1c is 6.5 or less.  Continue current medications.  

## 2011-08-11 NOTE — Assessment & Plan Note (Signed)
Stable with no claudicaiton

## 2011-08-11 NOTE — Assessment & Plan Note (Signed)
CABG.  Poor functional capacity and pain. Cardiac rehab.  Nitro to be called in Check PFT;s.  Doubt issue with grafts so soon

## 2011-08-12 ENCOUNTER — Ambulatory Visit (INDEPENDENT_AMBULATORY_CARE_PROVIDER_SITE_OTHER): Payer: PRIVATE HEALTH INSURANCE | Admitting: Internal Medicine

## 2011-08-12 DIAGNOSIS — R0989 Other specified symptoms and signs involving the circulatory and respiratory systems: Secondary | ICD-10-CM

## 2011-08-12 DIAGNOSIS — F4323 Adjustment disorder with mixed anxiety and depressed mood: Secondary | ICD-10-CM

## 2011-08-12 DIAGNOSIS — J449 Chronic obstructive pulmonary disease, unspecified: Secondary | ICD-10-CM

## 2011-08-12 LAB — PULMONARY FUNCTION TEST

## 2011-08-12 NOTE — Progress Notes (Signed)
PFT was done today.  

## 2011-08-14 ENCOUNTER — Ambulatory Visit (HOSPITAL_COMMUNITY): Payer: PRIVATE HEALTH INSURANCE

## 2011-08-18 ENCOUNTER — Encounter: Payer: Self-pay | Admitting: Cardiovascular Disease

## 2011-08-18 ENCOUNTER — Ambulatory Visit (HOSPITAL_COMMUNITY): Payer: PRIVATE HEALTH INSURANCE

## 2011-08-20 ENCOUNTER — Ambulatory Visit (HOSPITAL_COMMUNITY): Payer: PRIVATE HEALTH INSURANCE

## 2011-08-22 ENCOUNTER — Ambulatory Visit (HOSPITAL_COMMUNITY): Payer: PRIVATE HEALTH INSURANCE

## 2011-08-25 ENCOUNTER — Ambulatory Visit (HOSPITAL_COMMUNITY): Payer: PRIVATE HEALTH INSURANCE

## 2011-08-27 ENCOUNTER — Ambulatory Visit (HOSPITAL_COMMUNITY): Payer: PRIVATE HEALTH INSURANCE

## 2011-08-29 ENCOUNTER — Ambulatory Visit (HOSPITAL_COMMUNITY): Payer: PRIVATE HEALTH INSURANCE

## 2011-09-01 ENCOUNTER — Ambulatory Visit (HOSPITAL_COMMUNITY): Payer: PRIVATE HEALTH INSURANCE

## 2011-09-03 ENCOUNTER — Ambulatory Visit (HOSPITAL_COMMUNITY): Payer: PRIVATE HEALTH INSURANCE

## 2011-09-05 ENCOUNTER — Ambulatory Visit (HOSPITAL_COMMUNITY): Payer: PRIVATE HEALTH INSURANCE

## 2011-09-08 ENCOUNTER — Ambulatory Visit (HOSPITAL_COMMUNITY): Payer: PRIVATE HEALTH INSURANCE

## 2011-09-10 ENCOUNTER — Ambulatory Visit (HOSPITAL_COMMUNITY): Payer: PRIVATE HEALTH INSURANCE

## 2011-09-12 ENCOUNTER — Ambulatory Visit (HOSPITAL_COMMUNITY): Payer: PRIVATE HEALTH INSURANCE

## 2011-09-15 ENCOUNTER — Ambulatory Visit (HOSPITAL_COMMUNITY): Payer: PRIVATE HEALTH INSURANCE

## 2011-09-17 ENCOUNTER — Ambulatory Visit (HOSPITAL_COMMUNITY): Payer: PRIVATE HEALTH INSURANCE

## 2011-09-18 ENCOUNTER — Other Ambulatory Visit (INDEPENDENT_AMBULATORY_CARE_PROVIDER_SITE_OTHER): Payer: PRIVATE HEALTH INSURANCE

## 2011-09-18 DIAGNOSIS — E119 Type 2 diabetes mellitus without complications: Secondary | ICD-10-CM

## 2011-09-18 DIAGNOSIS — E785 Hyperlipidemia, unspecified: Secondary | ICD-10-CM

## 2011-09-18 LAB — HEMOGLOBIN A1C: Hgb A1c MFr Bld: 7.2 % — ABNORMAL HIGH (ref 4.6–6.5)

## 2011-09-18 LAB — LIPID PANEL
Total CHOL/HDL Ratio: 6
VLDL: 40.2 mg/dL — ABNORMAL HIGH (ref 0.0–40.0)

## 2011-09-18 LAB — LDL CHOLESTEROL, DIRECT: Direct LDL: 99.2 mg/dL

## 2011-09-19 ENCOUNTER — Ambulatory Visit (HOSPITAL_COMMUNITY): Payer: PRIVATE HEALTH INSURANCE

## 2011-09-22 ENCOUNTER — Ambulatory Visit (HOSPITAL_COMMUNITY): Payer: PRIVATE HEALTH INSURANCE

## 2011-09-24 ENCOUNTER — Ambulatory Visit (HOSPITAL_COMMUNITY): Payer: PRIVATE HEALTH INSURANCE

## 2011-09-25 ENCOUNTER — Other Ambulatory Visit: Payer: Self-pay | Admitting: Internal Medicine

## 2011-09-25 ENCOUNTER — Ambulatory Visit (INDEPENDENT_AMBULATORY_CARE_PROVIDER_SITE_OTHER): Payer: PRIVATE HEALTH INSURANCE | Admitting: Internal Medicine

## 2011-09-25 ENCOUNTER — Encounter: Payer: Self-pay | Admitting: Internal Medicine

## 2011-09-25 VITALS — BP 130/88 | HR 77 | Temp 98.2°F | Wt 155.0 lb

## 2011-09-25 DIAGNOSIS — E1151 Type 2 diabetes mellitus with diabetic peripheral angiopathy without gangrene: Secondary | ICD-10-CM | POA: Insufficient documentation

## 2011-09-25 DIAGNOSIS — I798 Other disorders of arteries, arterioles and capillaries in diseases classified elsewhere: Secondary | ICD-10-CM

## 2011-09-25 DIAGNOSIS — E785 Hyperlipidemia, unspecified: Secondary | ICD-10-CM

## 2011-09-25 DIAGNOSIS — E1159 Type 2 diabetes mellitus with other circulatory complications: Secondary | ICD-10-CM

## 2011-09-25 MED ORDER — EZETIMIBE 10 MG PO TABS: 10.0000 mg | ORAL_TABLET | Freq: Every day | ORAL | Status: DC

## 2011-09-25 MED ORDER — SITAGLIPTIN PHOSPHATE 100 MG PO TABS: 100.0000 mg | ORAL_TABLET | Freq: Every day | ORAL | Status: DC

## 2011-09-25 NOTE — Assessment & Plan Note (Signed)
LIPIDS   Better ldl just under 100 but better  at 70 but on high  dose atorva    Intensify lifestyle interventions. Could add on zetia as a trial samples and rx but unsure of cost factors.   Total abstinence for tobacco  would be best .

## 2011-09-25 NOTE — Patient Instructions (Signed)
Add [place] for diabetes and check your after eating blood sugars a few times a week.  tial of zetia to add on to your cholesterol  Meds.   Check to see if  On your formulary and covered  Check coupon reductions.   Continue diet changes.

## 2011-09-25 NOTE — Progress Notes (Signed)
  Subjective:    Patient ID: Kimberly [name], female    DOB: [birth year], 61 y.o.   MRN: [id number]  HPI Patient comes in today for follow up of  multiple medical problems.   DM  bg 120 - 130  But had to stop the whelchol as too expensive 50 $ per month . No new infection change vision numbness or weakness CAD PVD  No change in exercise tolerance has to stop after 1/2 mile or so but walking ok . No cp sob. No cough tobac down to  3 per day .  Bp  Has been ok.  Review of Systems No cp sob fever weight loss as per h pi no se of med but cant take metformin.   Past history family history social history reviewed in the electronic medical record.     Objective:   Physical Exam BP 130/88  Pulse 77  Temp(Src) 98.2 F (36.8 C) (Oral)  Wt 155 lb (70.308 kg)  SpO2 95% WDWN in nad Labs reviewed    Lab Results  Component Value Date   HGBA1C 7.2* 09/18/2011   Lab Results  Component Value Date   CHOL 164 09/18/2011   HDL 27.20* 09/18/2011   LDLCALC 80 01/16/2011   LDLDIRECT 99.2 09/18/2011   TRIG 201.0* 09/18/2011   CHOLHDL 6 09/18/2011   Lab Results  Component Value Date   CHOL 164 09/18/2011   CHOL 173 06/19/2011   CHOL 176 03/17/2011   Lab Results  Component Value Date   HDL 27.20* 09/18/2011   HDL 66.44* 06/19/2011   HDL 03.47* 03/17/2011   Lab Results  Component Value Date   LDLCALC 80 01/16/2011   LDLCALC 87 03/05/2010   LDLCALC 94 07/18/2009   Lab Results  Component Value Date   TRIG 201.0* 09/18/2011   TRIG 202.0* 06/19/2011   TRIG 236.0* 03/17/2011   Lab Results  Component Value Date   CHOLHDL 6 09/18/2011   CHOLHDL 6 06/19/2011   CHOLHDL 6 03/17/2011   Lab Results  Component Value Date   LDLDIRECT 99.2 09/18/2011   LDLDIRECT 112.8 06/19/2011   LDLDIRECT 119.8 03/17/2011   Lab Results  Component Value Date   HGBA1C 7.2* 09/18/2011       Assessment & Plan:     DM with PVD stable but not at goal had se of metformin and welchol too expensive  dsic options  Generic sulfonylurea  But  concern  about hypoglycemia which would be more risk. Goal is 6.5 and less      Sample for januvia  given 100 mg   With card and coupon  Se discussed   Recheck labs in 3 months  Monitor bg and pp bg     If not improving may have to add low dose sulfonurea with care . She is not interested in injectable meds at this time    LIPIDS   Better ldl just under 100 but better  at 70 but on high  dose atorva    Intensify lifestyle interventions. Could add on zetia as a trial samples and rx but unsure of cost factors.   Total abstinence for tobacco  would be best .   unsure why she was taken off lisinopril in the past   Will readress at fu.    Cost of meds are a factor in  Management   Total visit > 50% spent counseling and coordinating care

## 2011-09-25 NOTE — Assessment & Plan Note (Signed)
DM with PVD stable but not at goal had se of metformin and welchol too expensive  dsic options  Generic sulfonylurea  But concern  about hypoglycemia which would be more risk. Goal is 6.5 and less      Sample for januvia  given 100 mg   With card and coupon  Se discussed   Recheck labs in 3 months  Monitor bg and pp bg     If not improving may have to add low dose sulfonurea with care . She is not interested in injectable meds at this time

## 2011-09-26 ENCOUNTER — Ambulatory Visit (HOSPITAL_COMMUNITY): Payer: PRIVATE HEALTH INSURANCE

## 2011-09-29 ENCOUNTER — Telehealth: Payer: Self-pay | Admitting: Internal Medicine

## 2011-09-29 ENCOUNTER — Ambulatory Visit (HOSPITAL_COMMUNITY): Payer: PRIVATE HEALTH INSURANCE

## 2011-09-29 NOTE — Telephone Encounter (Signed)
Patient called stating that she was put on some sugar pills and she is swelling and would like to know what to do. Please advise.

## 2011-09-29 NOTE — Telephone Encounter (Signed)
Pt states the [place] is causing swelling in her feet and legs.  Pt states she has been taking one pill daily.  Pt denies any other symptoms. Pls advise.

## 2011-09-30 ENCOUNTER — Telehealth: Payer: Self-pay | Admitting: Internal Medicine

## 2011-09-30 NOTE — Telephone Encounter (Signed)
Pt called and said that she now has swelling all over. Pts hands, fingers, abd and legs are all swollen. Pt has been waiting on call back from nurse since yesterday 09/29/11.  Pls call asap today.

## 2011-09-30 NOTE — Telephone Encounter (Signed)
Unsure if related  Has appt tomorrow

## 2011-09-30 NOTE — Telephone Encounter (Signed)
Called pt and pt will come in 09/30/11.

## 2011-10-01 ENCOUNTER — Ambulatory Visit
Admission: RE | Admit: 2011-10-01 | Discharge: 2011-10-01 | Disposition: A | Discharge status: Home / Self Care | Payer: PRIVATE HEALTH INSURANCE | Source: Ambulatory Visit | Attending: Internal Medicine | Admitting: Internal Medicine

## 2011-10-01 ENCOUNTER — Encounter: Payer: Self-pay | Admitting: Internal Medicine

## 2011-10-01 ENCOUNTER — Ambulatory Visit (HOSPITAL_COMMUNITY): Payer: PRIVATE HEALTH INSURANCE

## 2011-10-01 ENCOUNTER — Telehealth: Payer: Self-pay | Admitting: Cardiovascular Disease

## 2011-10-01 ENCOUNTER — Ambulatory Visit (INDEPENDENT_AMBULATORY_CARE_PROVIDER_SITE_OTHER): Payer: PRIVATE HEALTH INSURANCE | Admitting: Internal Medicine

## 2011-10-01 VITALS — BP 140/90 | HR 78 | Temp 98.6°F | Wt 157.0 lb

## 2011-10-01 DIAGNOSIS — R1900 Intra-abdominal and pelvic swelling, mass and lump, unspecified site: Secondary | ICD-10-CM

## 2011-10-01 DIAGNOSIS — E785 Hyperlipidemia, unspecified: Secondary | ICD-10-CM

## 2011-10-01 DIAGNOSIS — R609 Edema, unspecified: Secondary | ICD-10-CM

## 2011-10-01 DIAGNOSIS — IMO0002 Reserved for concepts with insufficient information to code with codable children: Secondary | ICD-10-CM

## 2011-10-01 DIAGNOSIS — T887XXA Unspecified adverse effect of drug or medicament, initial encounter: Secondary | ICD-10-CM

## 2011-10-01 DIAGNOSIS — E1151 Type 2 diabetes mellitus with diabetic peripheral angiopathy without gangrene: Secondary | ICD-10-CM

## 2011-10-01 DIAGNOSIS — I10 Essential (primary) hypertension: Secondary | ICD-10-CM

## 2011-10-01 DIAGNOSIS — E1159 Type 2 diabetes mellitus with other circulatory complications: Secondary | ICD-10-CM

## 2011-10-01 DIAGNOSIS — I798 Other disorders of arteries, arterioles and capillaries in diseases classified elsewhere: Secondary | ICD-10-CM

## 2011-10-01 LAB — CBC WITH DIFFERENTIAL/PLATELET
Basophils Relative: 0.5 % (ref 0.0–3.0)
Eosinophils Relative: 2.8 % (ref 0.0–5.0)
HCT: 43.8 % (ref 36.0–46.0)
Lymphs Abs: 1.8 10*3/uL (ref 0.7–4.0)
MCV: 91.3 fl (ref 78.0–100.0)
Monocytes Absolute: 0.6 10*3/uL (ref 0.1–1.0)
Monocytes Relative: 6.3 % (ref 3.0–12.0)
Neutrophils Relative %: 71.3 % (ref 43.0–77.0)
RBC: 4.8 Mil/uL (ref 3.87–5.11)
WBC: 9.5 10*3/uL (ref 4.5–10.5)

## 2011-10-01 LAB — HEPATIC FUNCTION PANEL
ALT: 28 U/L (ref 0–35)
AST: 24 U/L (ref 0–37)
Bilirubin, Direct: 0.1 mg/dL (ref 0.0–0.3)
Total Protein: 7.6 g/dL (ref 6.0–8.3)

## 2011-10-01 LAB — BASIC METABOLIC PANEL
Chloride: 108 mEq/L (ref 96–112)
GFR: 60.86 mL/min (ref >60.00)
Potassium: 5.1 mEq/L (ref 3.5–5.1)

## 2011-10-01 LAB — POCT URINALYSIS DIPSTICK
Bilirubin, UA: NEGATIVE
Blood, UA: NEGATIVE
Glucose, UA: NEGATIVE
Ketones, UA: NEGATIVE
Nitrite, UA: NEGATIVE
Spec Grav, UA: 1.015
pH, UA: 7

## 2011-10-01 LAB — T4, FREE: Free T4: 0.99 ng/dL (ref 0.60–1.60)

## 2011-10-01 MED ORDER — FUROSEMIDE 20 MG PO TABS: 20.0000 mg | ORAL_TABLET | Freq: Every day | ORAL | Status: DC

## 2011-10-01 NOTE — Telephone Encounter (Signed)
Pt rtn call to christine from yesterday

## 2011-10-01 NOTE — Telephone Encounter (Signed)
PT AWARE OF ECHO RESULTS./CY 

## 2011-10-01 NOTE — Patient Instructions (Signed)
Uncertain if this is a med reaction but because of the timing agee with stopping [place].  Labs today and will get ultrasound test of abdomen to check the swelling.  Then plan fu . We may add  Diuretic  At that point

## 2011-10-01 NOTE — Progress Notes (Signed)
  Subjective:    Patient ID: Kimberly [name], female    DOB: 28-Jan-1951, 61 y.o.   MRN: [id number]  HPI Patient comes in today for SDA for  new problem evaluation. She began to move he about 5-6 days ago and noticed some mild swelling of her legs the next day. It was a weekend she had increasing swelling in her legs abdomen and hands without associated pain chest pain or shortness of breath or GI symptoms. She does have increased thirst and urination but her blood sugars in the 120 range. She stopped the medication 2 days ago thinking it could be a medication reaction. Her legs decreased fluid in the morning and increase as the day goes on and she is up and around. She has never had these symptoms before.  Increase thirst  no fever no cp sob  No itching  No thorat sx and pain in ankles with walking  Review of Systems No fever cp sob cough  NVD or rash no itching   No syncope feels ok otherwise     Objective:   Physical Exam  BP 140/90  Pulse 78  Temp(Src) 98.6 F (37 C) (Oral)  Wt 157 lb (71.215 kg)  SpO2 97% Weight: 157 lb (71.215 kg)  Wt Readings from Last 3 Encounters:  10/01/11 157 lb (71.215 kg)  09/25/11 155 lb (70.308 kg)  08/11/11 165 lb (74.844 kg)  WDWN in nad  /wpne Chest:  Clear to A&P without wheezes rales or rhonchi CV:  S1-S2 no gallops 2/6 sem  murmurs peripheral perfusion is normal ABD protuberant and nontender and no mass ? If fluid  Noted no flank pain Legs  2+ edema  Pulsed present no ulcers  Hands slightly swollen not that obvious  But ring is tight.  No facial swelling   SKIN no icteric and no rash or hives or angioedema.     Assessment & Plan:   New onset edema with abdominal swelling rule out ascites timing consistent with a medication change however would seem atypical for this. Get stat ultrasound of abdomen laboratory studies and plan after fat. The Januvia as we discussed.   [place] normal  Labs generally nl  Will stay off tht [place] and trial of lasix  X  5-7 days and then rov next week

## 2011-10-03 ENCOUNTER — Ambulatory Visit (HOSPITAL_COMMUNITY): Payer: PRIVATE HEALTH INSURANCE

## 2011-10-06 ENCOUNTER — Ambulatory Visit (HOSPITAL_COMMUNITY): Payer: PRIVATE HEALTH INSURANCE

## 2011-10-07 ENCOUNTER — Ambulatory Visit (INDEPENDENT_AMBULATORY_CARE_PROVIDER_SITE_OTHER): Payer: PRIVATE HEALTH INSURANCE | Admitting: Internal Medicine

## 2011-10-07 ENCOUNTER — Encounter: Payer: Self-pay | Admitting: Internal Medicine

## 2011-10-07 VITALS — BP 124/80 | HR 74 | Temp 98.4°F | Wt 154.0 lb

## 2011-10-07 DIAGNOSIS — E1151 Type 2 diabetes mellitus with diabetic peripheral angiopathy without gangrene: Secondary | ICD-10-CM

## 2011-10-07 DIAGNOSIS — I798 Other disorders of arteries, arterioles and capillaries in diseases classified elsewhere: Secondary | ICD-10-CM

## 2011-10-07 DIAGNOSIS — E1159 Type 2 diabetes mellitus with other circulatory complications: Secondary | ICD-10-CM

## 2011-10-07 DIAGNOSIS — R609 Edema, unspecified: Secondary | ICD-10-CM | POA: Insufficient documentation

## 2011-10-07 DIAGNOSIS — I10 Essential (primary) hypertension: Secondary | ICD-10-CM

## 2011-10-07 DIAGNOSIS — T887XXA Unspecified adverse effect of drug or medicament, initial encounter: Secondary | ICD-10-CM | POA: Insufficient documentation

## 2011-10-07 MED ORDER — GLIMEPIRIDE 2 MG PO TABS: 1.0000 mg | ORAL_TABLET | Freq: Every day | ORAL | Status: DC

## 2011-10-07 NOTE — Progress Notes (Signed)
  Subjective:    Patient ID: Kimberly [name], female    DOB: 10/16/1950, 61 y.o.   MRN: [id number]  HPI ptt comesin for follow up of her swelling occurred after  Beginning januvia.  Since her last visit she did not take the Lasix and to stop the medication. The swelling in her abdomen legs and hands are down and she feels fine and back to normal. However her blood sugars are elevated. She shows a list of 5 different days fasting blood sugar 169 , 206 157 159 143  Blood pressure in the 1 10/15/2015 range. Pulse is anywhere from 79-93.  She has been off WelChol at least a month.   Review of Systems Negative chest pain shortness of breath fever or bleeding itching. No unusual rashes. Past history family history social history reviewed in the electronic medical record.     Objective:   Physical Exam  BP 124/80  Pulse 74  Temp(Src) 98.4 F (36.9 C) (Oral)  Wt 154 lb (69.854 kg)  SpO2 98% Wt Readings from Last 3 Encounters:  10/07/11 154 lb (69.854 kg)  10/01/11 157 lb (71.215 kg)  09/25/11 155 lb (70.308 kg)   well-developed well-nourished in no acute distress looks well today. Chest clear to auscultation cardiac regular rhythm abdomen much less protuberant and swollen no fluid wave no hepatosplenomegaly  Hands clear extremities no edema    Assessment & Plan:   Edema apparent side effect from Januvia. This is by context and timing although it appears to be unusual in a cannot find this as a side effect in the literature.  Her abdominal ultrasound and laboratory studies were unrevealing there was no kidney failure proteinuria.   Will call appeared to work fairly well but was too expensive to continue using.  The next step is we will use a low-dose sulfonylurea with care to prevent hypoglycemia.  We'll begin Amaryl 1 mg a half of a 2 mg once a day and we discussed monitoring.  She will bring in blood sugars  Recording  To drop off in the meantime and keep 3 month check and labs.     She is on the [place] has no side effects from that.

## 2011-10-07 NOTE — Patient Instructions (Signed)
N. low-dose Amaryl. Take a half a tablet or 1 mg in the morning before breakfast  Check her blood sugars twice a day fasting and either before dinner or before lunch or before bedtime.  If we are continue to have elevated blood sugars we may increase this to 2 mg a day. We want to avoid low blood sugars in your situation.  The goal is to have your fasting blood sugar between 90 and 120 and after eating below 150  we could have tighter control but I want to avoid low blood sugar.  Keep your followup appointment planned to recheck her lipids and A1c.

## 2011-10-08 ENCOUNTER — Ambulatory Visit (HOSPITAL_COMMUNITY): Payer: PRIVATE HEALTH INSURANCE

## 2011-10-10 ENCOUNTER — Ambulatory Visit (HOSPITAL_COMMUNITY): Payer: PRIVATE HEALTH INSURANCE

## 2011-10-13 ENCOUNTER — Ambulatory Visit (HOSPITAL_COMMUNITY): Payer: PRIVATE HEALTH INSURANCE

## 2011-10-15 ENCOUNTER — Ambulatory Visit (HOSPITAL_COMMUNITY): Payer: PRIVATE HEALTH INSURANCE

## 2011-10-17 ENCOUNTER — Ambulatory Visit (HOSPITAL_COMMUNITY): Payer: PRIVATE HEALTH INSURANCE

## 2011-10-20 ENCOUNTER — Ambulatory Visit (HOSPITAL_COMMUNITY): Payer: PRIVATE HEALTH INSURANCE

## 2011-10-22 ENCOUNTER — Ambulatory Visit (HOSPITAL_COMMUNITY): Payer: PRIVATE HEALTH INSURANCE

## 2011-10-24 ENCOUNTER — Ambulatory Visit (HOSPITAL_COMMUNITY): Payer: PRIVATE HEALTH INSURANCE

## 2011-10-27 ENCOUNTER — Ambulatory Visit (HOSPITAL_COMMUNITY): Payer: PRIVATE HEALTH INSURANCE

## 2011-10-29 ENCOUNTER — Ambulatory Visit (HOSPITAL_COMMUNITY): Payer: PRIVATE HEALTH INSURANCE

## 2011-10-29 ENCOUNTER — Telehealth: Payer: Self-pay | Admitting: Internal Medicine

## 2011-10-29 NOTE — Telephone Encounter (Signed)
Tell patient I reviewed her blood sugars  blood sugars goal  below  170 after eating or 120 fasting Continue on the  2 mg per day ( whole pill)   Send in  Her reading log again in  About a month.  We may increase to 3 mg per day at that time  I  (Log readings after 2 mg 148.95 153 149 159 and 183 )

## 2011-10-29 NOTE — Telephone Encounter (Signed)
Left a message for pt to return call 

## 2011-10-31 ENCOUNTER — Ambulatory Visit (HOSPITAL_COMMUNITY): Payer: PRIVATE HEALTH INSURANCE

## 2011-10-31 ENCOUNTER — Other Ambulatory Visit: Payer: Self-pay | Admitting: Internal Medicine

## 2011-10-31 NOTE — Telephone Encounter (Signed)
Spoke with Kimberly [name] and Kimberly [name] is aware of Dr. Rosezella [place] recommendations.  Kimberly [name] states she will log her readings and call to report in 1 month.

## 2011-11-03 ENCOUNTER — Ambulatory Visit (HOSPITAL_COMMUNITY): Payer: PRIVATE HEALTH INSURANCE

## 2011-11-05 ENCOUNTER — Ambulatory Visit (HOSPITAL_COMMUNITY): Payer: PRIVATE HEALTH INSURANCE

## 2011-11-07 ENCOUNTER — Ambulatory Visit (HOSPITAL_COMMUNITY): Payer: PRIVATE HEALTH INSURANCE

## 2011-11-12 ENCOUNTER — Encounter: Payer: Self-pay | Admitting: Cardiovascular Disease

## 2011-11-12 ENCOUNTER — Ambulatory Visit (INDEPENDENT_AMBULATORY_CARE_PROVIDER_SITE_OTHER): Payer: PRIVATE HEALTH INSURANCE | Admitting: Cardiovascular Disease

## 2011-11-12 VITALS — BP 132/83 | HR 75 | Ht 64.0 in | Wt 157.0 lb

## 2011-11-12 DIAGNOSIS — I739 Peripheral vascular disease, unspecified: Secondary | ICD-10-CM

## 2011-11-12 DIAGNOSIS — I714 Abdominal aortic aneurysm, without rupture, unspecified: Secondary | ICD-10-CM

## 2011-11-12 DIAGNOSIS — E785 Hyperlipidemia, unspecified: Secondary | ICD-10-CM

## 2011-11-12 DIAGNOSIS — R011 Cardiac murmur, unspecified: Secondary | ICD-10-CM

## 2011-11-12 DIAGNOSIS — E119 Type 2 diabetes mellitus without complications: Secondary | ICD-10-CM

## 2011-11-12 DIAGNOSIS — I10 Essential (primary) hypertension: Secondary | ICD-10-CM

## 2011-11-12 NOTE — Assessment & Plan Note (Signed)
Well controlled.  Continue current medications and low sodium Dash type diet.    

## 2011-11-12 NOTE — Progress Notes (Signed)
Patient ID: Kimberly [name], female   DOB: 1950/06/29, 61 y.o.   MRN: [id number] 61 yo seen 8/12 for CABG. Previous CAD with stent to circumflex. Smoker. PVD with previous Aobifem. Operated on by [facility]  CABG x3 using a left internal mammary  artery graft to the left anterior descending coronary artery with a  saphenous vein graft to the obtuse marginal branch of the left  circumflex coronary artery, and a saphenous vein graft to the right  coronary artery.  Smoking 2-4 cigs/day. Counseled for less then 10 minutes on cessation Has tightness in her chest with minimal walking. Not sure if it is bronchospasm or angina. Needs nitro.  No claudication. No resting symptoms. Filled out DMV form for her for handicapped placard  ROS: Denies fever, malais, weight loss, blurry vision, decreased visual acuity, cough, sputum, SOB, hemoptysis, pleuritic pain, palpitaitons, heartburn, abdominal pain, melena, lower extremity edema, claudication, or rash.  All other systems reviewed and negative  General: Affect appropriate Desheveled female with nicotine on breath HEENT: normal Neck supple with no adenopathy JVP normal no bruits no thyromegaly Lungs clear with no wheezing and good diaphragmatic motion Heart:  S1/S2 systolic ejection  murmur, no rub, gallop or click PMI normal Abdomen: benighn, BS positve, no tenderness, S/P aorto right fem bypass Bilateral femoral  bruit.  No HSM or HJR Distal pulses intact with no bruits No edema Neuro non-focal Skin warm and dry No muscular weakness   Current Outpatient Prescriptions  Medication Sig Dispense Refill  . albuterol (PROVENTIL HFA;VENTOLIN HFA) 108 (90 BASE) MCG/ACT inhaler Inhale 2 puffs into the lungs every 6 (six) hours as needed.        Marland Kitchen aspirin 325 MG EC tablet Take 325 mg by mouth daily.        Marland Kitchen atorvastatin (LIPITOR) 80 MG tablet TAKE 1 TABLET EVERY DAY  90 tablet  0  . ezetimibe (ZETIA) 10 MG tablet Take 1 tablet (10 mg total) by mouth daily.  30  tablet  3  . fenofibrate micronized (LOFIBRA) 200 MG capsule TAKE ONE CAPSULE BY MOUTH EVERY DAY  90 capsule  0  . glimepiride (AMARYL) 2 MG tablet Take 0.5 tablets (1 mg total) by mouth daily before breakfast. Or as directed  30 tablet  3  . nitroGLYCERIN (NITROSTAT) 0.4 MG SL tablet Place 1 tablet (0.4 mg total) under the tongue every 5 (five) minutes as needed for chest pain.  25 tablet  4  . Omega-3 Fatty Acids (FISH OIL PO) Take 1 tablet by mouth daily.       Marland Kitchen VITAMIN D, ERGOCALCIFEROL, PO Take 1 tablet by mouth daily.         Allergies  Metformin and related and Januvia  Electrocardiogram:  Assessment and Plan

## 2011-11-12 NOTE — Assessment & Plan Note (Signed)
AV sclerosis  S2 normal  F/U echo if symptoms develop or murmur changes

## 2011-11-12 NOTE — Assessment & Plan Note (Signed)
Discussed low carb diet.  Target hemoglobin A1c is 6.5 or less.  Continue current medications.  

## 2011-11-12 NOTE — Assessment & Plan Note (Signed)
Some leg fatigue after 1/2 mile.  Check LE arterial duplex and ABI's Will have her F/U with Dr Arbie Cookey if any problems

## 2011-11-12 NOTE — Assessment & Plan Note (Signed)
Cholesterol is at goal.  Continue current dose of statin and diet Rx.  No myalgias or side effects.  F/U  LFT's in 6 months. Lab Results  Component Value Date   LDLCALC 80 01/16/2011             

## 2011-11-12 NOTE — Patient Instructions (Signed)
Your physician wants you to follow-up in: YEAR  WITH DR Haywood Filler will receive a reminder letter in the mail two months in advance. If you don't receive a letter, please call our office to schedule the follow-up appointment. Your physician recommends that you continue on your current medications as directed. Please refer to the Current Medication list given to you today. Your physician has requested that you have an abdominal aorta duplex. During this test, an ultrasound is used to evaluate the aorta. Allow 30 minutes for this exam. Do not eat after midnight the day before and avoid carbonated beverages  DX AAA Your physician has requested that you have a lower or upper extremity arterial duplex. This test is an ultrasound of the arteries in the legs or arms. It looks at arterial blood flow in the legs and arms. Allow one hour for Lower and Upper Arterial scans. There are no restrictions or special instructions  LOWER WITH ABI'S  DX PVD

## 2011-11-14 ENCOUNTER — Ambulatory Visit (HOSPITAL_COMMUNITY): Payer: PRIVATE HEALTH INSURANCE

## 2011-11-16 ENCOUNTER — Other Ambulatory Visit: Payer: Self-pay | Admitting: Internal Medicine

## 2011-11-17 ENCOUNTER — Ambulatory Visit (HOSPITAL_COMMUNITY): Payer: PRIVATE HEALTH INSURANCE

## 2011-11-19 ENCOUNTER — Ambulatory Visit (HOSPITAL_COMMUNITY): Payer: PRIVATE HEALTH INSURANCE

## 2011-11-21 ENCOUNTER — Ambulatory Visit (HOSPITAL_COMMUNITY): Payer: PRIVATE HEALTH INSURANCE

## 2011-12-10 ENCOUNTER — Telehealth: Payer: Self-pay | Admitting: Internal Medicine

## 2011-12-10 NOTE — Telephone Encounter (Signed)
Tell pt that i reviewed her readings they are better over all. Stay on the current dose of med ( 2 mg) and  Proceed with the lab testing scheduled in a few weeks and follow up.  In regard to the "letter request " talk to me about it at your office visit   In July.

## 2011-12-10 NOTE — Telephone Encounter (Signed)
Informed pt of test results.  Talk about letter when she comes and continue with labs in a few weeks.

## 2011-12-16 ENCOUNTER — Other Ambulatory Visit (INDEPENDENT_AMBULATORY_CARE_PROVIDER_SITE_OTHER): Payer: PRIVATE HEALTH INSURANCE

## 2011-12-16 DIAGNOSIS — E785 Hyperlipidemia, unspecified: Secondary | ICD-10-CM

## 2011-12-16 DIAGNOSIS — E119 Type 2 diabetes mellitus without complications: Secondary | ICD-10-CM

## 2011-12-16 LAB — LIPID PANEL
Cholesterol: 140 mg/dL (ref 0–200)
Total CHOL/HDL Ratio: 7
Triglycerides: 243 mg/dL — ABNORMAL HIGH (ref 0.0–149.0)

## 2011-12-22 ENCOUNTER — Encounter: Payer: Self-pay | Admitting: Family Medicine

## 2011-12-25 ENCOUNTER — Encounter: Payer: Self-pay | Admitting: Internal Medicine

## 2011-12-25 ENCOUNTER — Ambulatory Visit (INDEPENDENT_AMBULATORY_CARE_PROVIDER_SITE_OTHER): Payer: PRIVATE HEALTH INSURANCE | Admitting: Internal Medicine

## 2011-12-25 VITALS — BP 140/84 | HR 77 | Temp 98.2°F | Wt 155.0 lb

## 2011-12-25 DIAGNOSIS — E1151 Type 2 diabetes mellitus with diabetic peripheral angiopathy without gangrene: Secondary | ICD-10-CM

## 2011-12-25 DIAGNOSIS — I739 Peripheral vascular disease, unspecified: Secondary | ICD-10-CM

## 2011-12-25 DIAGNOSIS — I2510 Atherosclerotic heart disease of native coronary artery without angina pectoris: Secondary | ICD-10-CM

## 2011-12-25 DIAGNOSIS — E119 Type 2 diabetes mellitus without complications: Secondary | ICD-10-CM

## 2011-12-25 DIAGNOSIS — E1159 Type 2 diabetes mellitus with other circulatory complications: Secondary | ICD-10-CM

## 2011-12-25 DIAGNOSIS — E785 Hyperlipidemia, unspecified: Secondary | ICD-10-CM

## 2011-12-25 DIAGNOSIS — I798 Other disorders of arteries, arterioles and capillaries in diseases classified elsewhere: Secondary | ICD-10-CM

## 2011-12-25 DIAGNOSIS — I10 Essential (primary) hypertension: Secondary | ICD-10-CM

## 2011-12-25 MED ORDER — GLIMEPIRIDE 2 MG PO TABS: 3.0000 mg | ORAL_TABLET | Freq: Every day | ORAL | Status: DC

## 2011-12-25 NOTE — Patient Instructions (Addendum)
Increase the glimepriride to 3 mg per day   Send in copy of sugars in a month and will decide about increasing again..  The zetia is not helping based on the lipid numbers. So can stop this.  Try to exercise as much as possible to help triglycerides.   Will do statement about limiting physical caretaking.  ROV in  2 -3 months   Lab pre visit

## 2011-12-25 NOTE — Progress Notes (Signed)
Subjective:    Patient ID: Kimberly [name], female    DOB: 09-28-1950, 61 y.o.   MRN: [id number]  HPI Patient comes in today for follow up of  multiple medical problems.  DM: taking 3 mg amaryl for now  Sugars monitored high 100 and some low 200  Feels ok but hadOne episode of  Feeling shaky and took sugar.  LIPIDS  zetia is too expensive about 50 per month  No se noted Can walk almost 3/4 mile with rest but going out of town and asks for handicapped sticker to be signed  Because of need to walk far and going to [place].  Asks about letter statement about  limitation for  taking care of elderly sick person needed to lift  .   Review of Systems  no cp sob can walk almost 3/4 mile of less at a slow pace but has to stop and rest  No current cp sob still some tobacco no new gi gu sx and no new edema no falling or vision change no bleeding  Outpatient Encounter Prescriptions as of 12/25/2011  Medication Sig Dispense Refill  . albuterol (PROVENTIL HFA;VENTOLIN HFA) 108 (90 BASE) MCG/ACT inhaler Inhale 2 puffs into the lungs every 6 (six) hours as needed.        Marland Kitchen aspirin 325 MG EC tablet Take 325 mg by mouth daily.        Marland Kitchen atorvastatin (LIPITOR) 80 MG tablet TAKE 1 TABLET EVERY DAY  90 tablet  0  . ezetimibe (ZETIA) 10 MG tablet Take 1 tablet (10 mg total) by mouth daily.  30 tablet  3  . fenofibrate micronized (LOFIBRA) 200 MG capsule TAKE ONE CAPSULE BY MOUTH EVERY DAY  90 capsule  0  . glimepiride (AMARYL) 2 MG tablet Take 0.5 tablets (1 mg total) by mouth daily before breakfast. Or as directed  30 tablet  3  . nitroGLYCERIN (NITROSTAT) 0.4 MG SL tablet Place 1 tablet (0.4 mg total) under the tongue every 5 (five) minutes as needed for chest pain.  25 tablet  4  . VITAMIN D, ERGOCALCIFEROL, PO Take 1 tablet by mouth daily.       Marland Kitchen DISCONTD: Omega-3 Fatty Acids (FISH OIL PO) Take 1 tablet by mouth daily.            Objective:   Physical Exam  BP 140/84  Pulse 77  Temp 98.2 F (36.8 C)  (Oral)  Wt 155 lb (70.308 kg)  SpO2 96% WDWn in nad In nad  Looks well today  Lab Results  Component Value Date   WBC 9.5 10/01/2011   HGB 14.7 10/01/2011   HCT 43.8 10/01/2011   PLT 165.0 10/01/2011   GLUCOSE 92 10/01/2011   CHOL 140 12/16/2011   TRIG 243.0* 12/16/2011   HDL 20.00* 12/16/2011   LDLDIRECT 88.0 12/16/2011   LDLCALC 80 01/16/2011   ALT 28 10/01/2011   AST 24 10/01/2011   NA 142 10/01/2011   K 5.1 10/01/2011   CL 108 10/01/2011   CREATININE 1.0 10/01/2011   BUN 24* 10/01/2011   CO2 25 10/01/2011   TSH 4.18 10/01/2011   INR 1.38 01/22/2011   HGBA1C 7.1* 12/16/2011   MICROALBUR 1.0 03/17/2011      Assessment & Plan:  Dm Not optimum  Has se of meds  Metformin and januvia ? If ever took welchol  Cost a factor  Consider endocrine eval   Increase amaryl to 3 mg and then 4 if  needed  Avoid low sugars  LIPIDS:  No sig change on zetia  And cost a lot  And asks for generic   Tobacco small amt  PVD claudication    Handicapped sticker signed  . Will do statement of  Limiting physical caretaking .  Activity  as tolerated ok.  HT slightly up today  Consider referral/ consider insuln  But would avoid low sugars as dangerous Recheck i a few months and if not better consider insulin and or endocrine referral ;  9 months ago her numbers were very good . And a1c was 5.8   Total visit > 50% spent counseling and coordinating care  Will write statement.

## 2012-01-02 ENCOUNTER — Other Ambulatory Visit: Payer: Self-pay | Admitting: Internal Medicine

## 2012-01-31 ENCOUNTER — Emergency Department (HOSPITAL_COMMUNITY): Payer: PRIVATE HEALTH INSURANCE

## 2012-01-31 ENCOUNTER — Encounter (HOSPITAL_COMMUNITY): Payer: Self-pay | Admitting: *Deleted

## 2012-01-31 ENCOUNTER — Inpatient Hospital Stay (HOSPITAL_COMMUNITY)
Admission: EM | Admit: 2012-01-31 | Discharge: 2012-02-01 | DRG: 394 | Disposition: A | Discharge status: Home / Self Care | Payer: PRIVATE HEALTH INSURANCE | Attending: Internal Medicine | Admitting: Internal Medicine

## 2012-01-31 ENCOUNTER — Other Ambulatory Visit (HOSPITAL_COMMUNITY): Payer: PRIVATE HEALTH INSURANCE

## 2012-01-31 DIAGNOSIS — K5792 Diverticulitis of intestine, part unspecified, without perforation or abscess without bleeding: Secondary | ICD-10-CM

## 2012-01-31 DIAGNOSIS — K523 Indeterminate colitis: Secondary | ICD-10-CM

## 2012-01-31 DIAGNOSIS — K559 Vascular disorder of intestine, unspecified: Principal | ICD-10-CM | POA: Diagnosis present

## 2012-01-31 DIAGNOSIS — E1159 Type 2 diabetes mellitus with other circulatory complications: Secondary | ICD-10-CM | POA: Diagnosis present

## 2012-01-31 DIAGNOSIS — M810 Age-related osteoporosis without current pathological fracture: Secondary | ICD-10-CM | POA: Diagnosis present

## 2012-01-31 DIAGNOSIS — I10 Essential (primary) hypertension: Secondary | ICD-10-CM | POA: Diagnosis present

## 2012-01-31 DIAGNOSIS — Z8679 Personal history of other diseases of the circulatory system: Secondary | ICD-10-CM

## 2012-01-31 DIAGNOSIS — R011 Cardiac murmur, unspecified: Secondary | ICD-10-CM

## 2012-01-31 DIAGNOSIS — K5289 Other specified noninfective gastroenteritis and colitis: Secondary | ICD-10-CM

## 2012-01-31 DIAGNOSIS — E785 Hyperlipidemia, unspecified: Secondary | ICD-10-CM

## 2012-01-31 DIAGNOSIS — F17200 Nicotine dependence, unspecified, uncomplicated: Secondary | ICD-10-CM | POA: Diagnosis present

## 2012-01-31 DIAGNOSIS — J4489 Other specified chronic obstructive pulmonary disease: Secondary | ICD-10-CM | POA: Diagnosis present

## 2012-01-31 DIAGNOSIS — J449 Chronic obstructive pulmonary disease, unspecified: Secondary | ICD-10-CM

## 2012-01-31 DIAGNOSIS — Z0000 Encounter for general adult medical examination without abnormal findings: Secondary | ICD-10-CM

## 2012-01-31 DIAGNOSIS — Z888 Allergy status to other drugs, medicaments and biological substances status: Secondary | ICD-10-CM

## 2012-01-31 DIAGNOSIS — I2510 Atherosclerotic heart disease of native coronary artery without angina pectoris: Secondary | ICD-10-CM | POA: Diagnosis present

## 2012-01-31 DIAGNOSIS — Z87891 Personal history of nicotine dependence: Secondary | ICD-10-CM

## 2012-01-31 DIAGNOSIS — F4323 Adjustment disorder with mixed anxiety and depressed mood: Secondary | ICD-10-CM

## 2012-01-31 DIAGNOSIS — I252 Old myocardial infarction: Secondary | ICD-10-CM

## 2012-01-31 DIAGNOSIS — E1151 Type 2 diabetes mellitus with diabetic peripheral angiopathy without gangrene: Secondary | ICD-10-CM | POA: Diagnosis present

## 2012-01-31 DIAGNOSIS — E559 Vitamin D deficiency, unspecified: Secondary | ICD-10-CM

## 2012-01-31 DIAGNOSIS — Z951 Presence of aortocoronary bypass graft: Secondary | ICD-10-CM

## 2012-01-31 DIAGNOSIS — I739 Peripheral vascular disease, unspecified: Secondary | ICD-10-CM | POA: Diagnosis present

## 2012-01-31 DIAGNOSIS — E119 Type 2 diabetes mellitus without complications: Secondary | ICD-10-CM

## 2012-01-31 DIAGNOSIS — T887XXA Unspecified adverse effect of drug or medicament, initial encounter: Secondary | ICD-10-CM

## 2012-01-31 DIAGNOSIS — Z90710 Acquired absence of both cervix and uterus: Secondary | ICD-10-CM

## 2012-01-31 DIAGNOSIS — K5732 Diverticulitis of large intestine without perforation or abscess without bleeding: Secondary | ICD-10-CM | POA: Diagnosis present

## 2012-01-31 DIAGNOSIS — Z8249 Family history of ischemic heart disease and other diseases of the circulatory system: Secondary | ICD-10-CM

## 2012-01-31 DIAGNOSIS — H409 Unspecified glaucoma: Secondary | ICD-10-CM | POA: Diagnosis present

## 2012-01-31 DIAGNOSIS — I70209 Unspecified atherosclerosis of native arteries of extremities, unspecified extremity: Secondary | ICD-10-CM | POA: Diagnosis present

## 2012-01-31 DIAGNOSIS — R609 Edema, unspecified: Secondary | ICD-10-CM

## 2012-01-31 HISTORY — DX: Indeterminate colitis: K52.3

## 2012-01-31 LAB — GLUCOSE, CAPILLARY: Glucose-Capillary: 143 mg/dL — ABNORMAL HIGH (ref 70–99)

## 2012-01-31 LAB — COMPREHENSIVE METABOLIC PANEL
ALT: 28 U/L (ref 0–35)
AST: 25 U/L (ref 0–37)
CO2: 24 mEq/L (ref 19–32)
Calcium: 10.7 mg/dL — ABNORMAL HIGH (ref 8.4–10.5)
Chloride: 105 mEq/L (ref 96–112)
Creatinine, Ser: 0.77 mg/dL (ref 0.50–1.10)
GFR calc Af Amer: >90 mL/min (ref >90)
GFR calc non Af Amer: 89 mL/min — ABNORMAL LOW (ref >90)
Glucose, Bld: 90 mg/dL (ref 70–99)
Total Bilirubin: 0.5 mg/dL (ref 0.3–1.2)

## 2012-01-31 LAB — URINALYSIS, ROUTINE W REFLEX MICROSCOPIC
Bilirubin Urine: NEGATIVE
Glucose, UA: NEGATIVE mg/dL
Hgb urine dipstick: NEGATIVE
Specific Gravity, Urine: 1.017 (ref 1.005–1.030)
pH: 5 (ref 5.0–8.0)

## 2012-01-31 LAB — CBC
Hemoglobin: 15.7 g/dL — ABNORMAL HIGH (ref 12.0–15.0)
MCH: 30.8 pg (ref 26.0–34.0)
MCV: 89 fL (ref 78.0–100.0)
Platelets: 188 10*3/uL (ref 150–400)
RBC: 5.09 MIL/uL (ref 3.87–5.11)
WBC: 8.5 10*3/uL (ref 4.0–10.5)

## 2012-01-31 LAB — PROTIME-INR
INR: 1.02 (ref 0.00–1.49)
Prothrombin Time: 13.6 seconds (ref 11.6–15.2)

## 2012-01-31 LAB — URINE MICROSCOPIC-ADD ON

## 2012-01-31 LAB — TYPE AND SCREEN

## 2012-01-31 LAB — APTT: aPTT: 31 seconds (ref 24–37)

## 2012-01-31 MED ORDER — SODIUM CHLORIDE 0.9 % IJ SOLN: 3.0000 mL | INTRAMUSCULAR | Status: DC | PRN

## 2012-01-31 MED ORDER — IOHEXOL 300 MG/ML  SOLN: 20.0000 mL | INTRAMUSCULAR | Status: DC

## 2012-01-31 MED ORDER — SODIUM CHLORIDE 0.9 % IJ SOLN: 3.0000 mL | Freq: Two times a day (BID) | INTRAMUSCULAR | Status: DC

## 2012-01-31 MED ORDER — METRONIDAZOLE IN NACL 5-0.79 MG/ML-% IV SOLN
500.0000 mg | Freq: Three times a day (TID) | INTRAVENOUS | Status: DC
  Administered 2012-01-31 – 2012-02-01 (×3): 500 mg via INTRAVENOUS
  Filled 2012-01-31 (×4): qty 100

## 2012-01-31 MED ORDER — INSULIN ASPART 100 UNIT/ML ~~LOC~~ SOLN
0.0000 [IU] | Freq: Three times a day (TID) | SUBCUTANEOUS | Status: DC
  Administered 2012-01-31: 1 [IU] via SUBCUTANEOUS

## 2012-01-31 MED ORDER — CIPROFLOXACIN IN D5W 400 MG/200ML IV SOLN
400.0000 mg | Freq: Once | INTRAVENOUS | Status: AC
  Administered 2012-01-31: 400 mg via INTRAVENOUS
  Filled 2012-01-31: qty 200

## 2012-01-31 MED ORDER — VITAMIN D3 25 MCG (1000 UNIT) PO TABS
1000.0000 [IU] | ORAL_TABLET | Freq: Every morning | ORAL | Status: DC
  Administered 2012-02-01: 1000 [IU] via ORAL
  Filled 2012-01-31: qty 1

## 2012-01-31 MED ORDER — DOCUSATE SODIUM 100 MG PO CAPS
100.0000 mg | ORAL_CAPSULE | Freq: Every day | ORAL | Status: DC
  Administered 2012-02-01: 100 mg via ORAL
  Filled 2012-01-31: qty 1

## 2012-01-31 MED ORDER — ACETAMINOPHEN 325 MG PO TABS: 650.0000 mg | ORAL_TABLET | Freq: Four times a day (QID) | ORAL | Status: DC | PRN

## 2012-01-31 MED ORDER — SODIUM CHLORIDE 0.9 % IV SOLN: 250.0000 mL | INTRAVENOUS | Status: DC | PRN

## 2012-01-31 MED ORDER — ACETAMINOPHEN 650 MG RE SUPP: 650.0000 mg | Freq: Four times a day (QID) | RECTAL | Status: DC | PRN

## 2012-01-31 MED ORDER — SODIUM CHLORIDE 0.9 % IV SOLN: INTRAVENOUS | Status: DC

## 2012-01-31 MED ORDER — CIPROFLOXACIN IN D5W 400 MG/200ML IV SOLN
400.0000 mg | Freq: Two times a day (BID) | INTRAVENOUS | Status: DC
  Administered 2012-02-01: 400 mg via INTRAVENOUS
  Filled 2012-01-31 (×2): qty 200

## 2012-01-31 MED ORDER — METRONIDAZOLE IN NACL 5-0.79 MG/ML-% IV SOLN
500.0000 mg | Freq: Once | INTRAVENOUS | Status: DC
  Filled 2012-01-31: qty 100

## 2012-01-31 MED ORDER — IOHEXOL 300 MG/ML  SOLN
100.0000 mL | Freq: Once | INTRAMUSCULAR | Status: AC | PRN
  Administered 2012-01-31: 100 mL via INTRAVENOUS

## 2012-01-31 MED ORDER — ASPIRIN EC 325 MG PO TBEC
325.0000 mg | DELAYED_RELEASE_TABLET | Freq: Every day | ORAL | Status: DC
  Administered 2012-02-01: 325 mg via ORAL
  Filled 2012-01-31: qty 1

## 2012-01-31 MED ORDER — ALBUTEROL SULFATE HFA 108 (90 BASE) MCG/ACT IN AERS
2.0000 | INHALATION_SPRAY | Freq: Four times a day (QID) | RESPIRATORY_TRACT | Status: DC | PRN
  Filled 2012-01-31: qty 6.7

## 2012-01-31 MED ORDER — FENOFIBRATE 160 MG PO TABS
160.0000 mg | ORAL_TABLET | Freq: Every day | ORAL | Status: DC
  Administered 2012-01-31 – 2012-02-01 (×2): 160 mg via ORAL
  Filled 2012-01-31 (×2): qty 1

## 2012-01-31 MED ORDER — ATORVASTATIN CALCIUM 80 MG PO TABS
80.0000 mg | ORAL_TABLET | Freq: Every evening | ORAL | Status: DC
  Administered 2012-01-31: 80 mg via ORAL
  Filled 2012-01-31 (×2): qty 1

## 2012-01-31 NOTE — ED Notes (Signed)
Report given to Burna Mortimer, Charity fundraiser. Pt transported to regular medical bed.

## 2012-01-31 NOTE — ED Notes (Signed)
CBG 120, Agricultural consultant notified. Flagyl also sent with patient, receiving nurse aware.

## 2012-01-31 NOTE — ED Notes (Signed)
Reports having LLQ pain that started yesterday at 11am, having bright red rectal bleeding every few mins.

## 2012-01-31 NOTE — Progress Notes (Signed)
LINEA CALLES [phone] Admitted to 5530: 01/31/2012 1630 Attending Provider: Lorane Gell, MD    Kimberly [name] is a 61 y.o. female patient admitted from ED awake, alert  & orientated  X 3,  Full Code, VSS - Blood pressure 124/76, pulse 70, temperature 98.1 F (36.7 C), temperature source Oral, resp. rate 18, height 5\' 3"  (1.6 m), weight 69.083 kg (152 lb 4.8 oz), SpO2 97.00%. RA, no c/o shortness of breath, no c/o chest pain, no distress noted. Tele # [id number] placed and pt is currently running:normal sinus rhythm.   IV site WDL:  antecubital right, condition patent and no redness with a transparent dsg that's clean dry and intact.  Allergies:   Allergies  Allergen Reactions  . Metformin And Related Nausea And Vomiting    After one pill at hospital  . Januvia (Sitagliptin Phosphate) Other (See Comments)    Swelling and edema see 4 13 OV     Past Medical History  Diagnosis Date  . CAD (coronary artery disease)   . DM (diabetes mellitus)   . HTN (hypertension)   . Glaucoma   . PVD (peripheral vascular disease)   . Osteoporosis   . COPD (chronic obstructive pulmonary disease)   . History of ventricular fibrillation   . Glaucoma     History:  obtained from the patient.  Pt orientation to unit, room and routine. Information packet given to patient/family and safety video watched.  Admission INP armband ID verified with patient/family, and in place. SR up x 2, fall risk assessment complete with Patient and family verbalizing understanding of risks associated with falls. Pt verbalizes an understanding of how to use the call bell and to call for help before getting out of bed.  Skin, clean-dry- intact without evidence of bruising, or skin tears.   No evidence of skin break down noted on exam.    Will cont to monitor and assist as needed.  Julien Nordmann [facility], RN 01/31/2012 5:35 PM

## 2012-01-31 NOTE — ED Notes (Signed)
Pt returned back to exam room from CT scan.  

## 2012-01-31 NOTE — Progress Notes (Signed)
Hypoglycemic Event  CBG: 60  Treatment: 15 GM carbohydrate snack  Symptoms: None  Follow-up CBG: Time:2159 CBG Result:143  Possible Reasons for Event: Inadequate meal intake and Change in activity  Comments/MD notified:none     Sherrilee Gilles, Ervin Knack  Remember to initiate Hypoglycemia Order Set & complete

## 2012-01-31 NOTE — H&P (Signed)
[facility] History and Physical  Kimberly [name] ZOX:[id number] DOB: 1951-02-18 DOA: 01/31/2012   PCP: Lorretta Harp, MD   Chief Complaint:  Chief Complaint  Patient presents with  . Abdominal Pain  . Rectal Bleeding     HPI:  61 yo woman with hx of ASCVD, CAD, tobacco abuse, presents to the ED with LLQ abd pain and hematochezia through the night. No fevers , no chills. In the ED CT scan indicates colitis, without perforation and with intact blood flow to the colon.    Review of Systems:  Denies any other symptoms  Past Medical History  Diagnosis Date  . CAD (coronary artery disease)   . DM (diabetes mellitus)   . HTN (hypertension)   . Glaucoma   . PVD (peripheral vascular disease)   . Osteoporosis   . COPD (chronic obstructive pulmonary disease)   . History of ventricular fibrillation   . Glaucoma    Past Surgical History  Procedure Date  . Cabg x 3 01/22/2011    Dr Laneta Simmers  . Cardiac catheterization 01/17/2011    Dr Alger Simons  . S/p myocardial infarction and pci of the lt circumflex 2000  . S/p aortobifemoral bypass 2008    Dr Arbie Cookey  . Abdominal hysterectomy     endometriosis   Social History:  reports that she has been smoking Cigarettes.  She has been smoking about 1 pack per day. She has never used smokeless tobacco. She reports that she does not drink alcohol or use illicit drugs. Lives home with husband    Allergies  Allergen Reactions  . Metformin And Related Nausea And Vomiting    After one pill at hospital  . Januvia (Sitagliptin Phosphate) Other (See Comments)    Swelling and edema see 4 13 OV    Family History  Problem Relation Age of Onset  . Heart attack Father   . Diabetes    . Hypertension    . Coronary artery disease    . Thyroid disease      sisters- sister had parathyroidectomy  . Alcohol abuse      brother     Prior to Admission medications   Medication Sig Start Date End Date Taking? Authorizing Provider  albuterol  (PROVENTIL HFA;VENTOLIN HFA) 108 (90 BASE) MCG/ACT inhaler Inhale 2 puffs into the lungs every 6 (six) hours as needed. For shortness of breath or wheezing   Yes Historical Provider, MD  aspirin 325 MG EC tablet Take 325 mg by mouth every morning.    Yes Historical Provider, MD  atorvastatin (LIPITOR) 80 MG tablet Take 80 mg by mouth every evening.   Yes Historical Provider, MD  cholecalciferol (VITAMIN D) 1000 UNITS tablet Take 1,000 Units by mouth every morning.   Yes Historical Provider, MD  Docusate Calcium (STOOL SOFTENER PO) Take 1 capsule by mouth daily.   Yes Historical Provider, MD  fenofibrate micronized (LOFIBRA) 200 MG capsule Take 200 mg by mouth every evening.   Yes Historical Provider, MD  glimepiride (AMARYL) 2 MG tablet Take 1.5 tablets (3 mg total) by mouth daily before breakfast. Or as directed 12/25/11 12/24/12 Yes Madelin Headings, MD  nitroGLYCERIN (NITROSTAT) 0.4 MG SL tablet Place 1 tablet (0.4 mg total) under the tongue every 5 (five) minutes as needed for chest pain. 08/11/11 08/10/12  Wendall Stade, MD   Physical Exam: Filed Vitals:   01/31/12 1030 01/31/12 1344 01/31/12 1546  BP: 153/82 125/60 139/64  Pulse: 84 71 66  Temp: 97.8  F (36.6 C)  98.7 F (37.1 C)  TempSrc: Oral  Oral  Resp: 20 18 18   SpO2: 98% 98% 98%     General:  axox3  Eyes: PERRLA, EOMI  ENT: clear, no JVD  Neck: no JVD  Cardiovascular: RRR, nl S1, S2  Respiratory: CTAB  Abdomen: soft, positive for LLQ tenderness, without rebound or guarding   Skin: no rash  Musculoskeletal: intact  Psychiatric: euthymic  Neurologic: strength 5/5 all 4  Labs on Admission:  Basic Metabolic Panel:  Lab 01/31/12 2841  NA 140  K 4.0  CL 105  CO2 24  GLUCOSE 90  BUN 18  CREATININE 0.77  CALCIUM 10.7*  MG --  PHOS --   Liver Function Tests:  Lab 01/31/12 1042  AST 25  ALT 28  ALKPHOS 74  BILITOT 0.5  PROT 7.7  ALBUMIN 4.2   No results found for this basename: LIPASE:5,AMYLASE:5 in  the last 168 hours No results found for this basename: AMMONIA:5 in the last 168 hours CBC:  Lab 01/31/12 1042  WBC 8.5  NEUTROABS --  HGB 15.7*  HCT 45.3  MCV 89.0  PLT 188   Cardiac Enzymes: No results found for this basename: CKTOTAL:5,CKMB:5,CKMBINDEX:5,TROPONINI:5 in the last 168 hours  BNP (last 3 results) No results found for this basename: PROBNP:3 in the last 8760 hours CBG:  Lab 01/31/12 1603 01/31/12 1350  GLUCAP 120* 57*    Radiological Exams on Admission: Ct Abdomen Pelvis W Contrast  01/31/2012  *RADIOLOGY REPORT*  Clinical Data: Left lower quadrant pain.  Rectal bleeding.  CT ABDOMEN AND PELVIS WITH CONTRAST  Technique:  Multidetector CT imaging of the abdomen and pelvis was performed following the standard protocol during bolus administration of intravenous contrast.  Contrast: OMNIPAQUE IOHEXOL 300 MG/ML  SOLN  Comparison: 12/07/2008  Findings: There is marked Alcorta thickening of the sigmoid colon, rectum, and upper anus.  Low density in the [place] is compatible with edema.  No pneumatosis.  There is stranding in the adjacent fat compatible with inflammatory changes.  No extraluminal bowel gas. No abscess.  Aorto bifemoral bypass graft is in place.  Graft and limbs are widely patent.  Atherosclerotic changes of the aorta above the proximal anastomosis are present.  SMA is patent.  Celiac is patent.  Branch vessels of the IMA are grossly patent.  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  Uterus is absent.  Adnexa are unremarkable.  Tiny hypodensity at the dome of the liver is stable.  Normal appendix.  Gallbladder, spleen, pancreas, adrenal glands are within normal limits.  Simple right renal cyst.  Lobulation of the kidneys most consistent with scarring.  IMPRESSION: There is Kilty thickening and inflammatory change of the sigmoid colon and rectum in a continuous fashion.  No evidence of abscess or extraluminal bowel gas to suggest rupture.  Differential  diagnosis includes pseudomembranous colitis, ulcerative colitis, diverticulitis.  Ischemic changes a consideration however branch vessels of the IMA are grossly patent.  Occasionally, neoplasm can have a similar appearance.  Correlate clinically as for the need for follow-up imaging or colonoscopy.  Original Report Authenticated By: Donavan Burnet, M.D.      Assessment/Plan Principal Problem:  *Colitis, indeterminate Active Problems:  DIABETES MELLITUS, TYPE II  CORONARY ARTERY DISEASE  PERIPHERAL VASCULAR DISEASE  History of ventricular fibrillation  Diabetes mellitus with peripheral vascular disease   1. Colitis - probably ischemic - given hx of ascvd - also possible infectious although unsure - would  place in observation and start iv abx , bowel rest.  2. DM 2- SSI 3. CAD - c/w aspirin and lipitor  4. DVT prophylaxis with SCDs  Code Status: Full Family Communication: Husband at bedside Disposition Plan: Home  Time spent: 45 minutes  Nathanael Krist [facility] Pager [phone]  If 7PM-7AM, please contact night-coverage [email or web address] Password [facility] 01/31/2012, 4:19 PM

## 2012-01-31 NOTE — ED Notes (Signed)
Pt given snack and drink. Resting quietly at the time. Family at bedside. Updated on plan of care. No signs of distress noted. Denies pain. Vital signs stable.

## 2012-01-31 NOTE — ED Notes (Signed)
Pt encouraged to continue drinking oral contrast for CT. Family remains at bedside. No signs of distress noted.

## 2012-01-31 NOTE — ED Notes (Signed)
Pt transported to CT scan.

## 2012-01-31 NOTE — ED Notes (Signed)
CBG 57 

## 2012-01-31 NOTE — ED Notes (Signed)
Pt presents to department for evaluation of LLQ abdominal pain and bright red rectal bleeding. Onset yesterday morning @11 :00am. Pt reports several bloody stools since, also states pain has increases. Describes pain as 8/10 and sharp in nature. Denies nausea/vomiting. Denies urinary/vaginal symptoms. LLQ tender to palpation. Abdomen soft. Bowel sounds present all quadrants. She is alert and oriented x4.

## 2012-01-31 NOTE — ED Provider Notes (Signed)
History     CSN: [id number]  Arrival date & time 01/31/12  1025   First MD Initiated Contact with Patient 01/31/12 1101      Chief Complaint  Patient presents with  . Abdominal Pain  . Rectal Bleeding    (Consider location/radiation/quality/duration/timing/severity/associated sxs/prior treatment) Patient is a 61 y.o. female presenting with abdominal pain and hematochezia. The history is provided by the patient.  Abdominal Pain The primary symptoms of the illness include abdominal pain and hematochezia.  Rectal Bleeding  Associated symptoms include abdominal pain.   patient here with left lower quadrant pain since yesterday that is now associated with rectal bleeding. Denies fever or vomiting. Denies any urinary symptoms. No colicky symptoms. No prior history of same. Blood is bright red. Denies any anal pain. No history of colon pathology. Patient does not take any blood thinners. Nothing makes symptoms better or worse and no treatment used prior to arrival  Past Medical History  Diagnosis Date  . CAD (coronary artery disease)   . DM (diabetes mellitus)   . HTN (hypertension)   . Glaucoma   . PVD (peripheral vascular disease)   . Osteoporosis   . COPD (chronic obstructive pulmonary disease)   . History of ventricular fibrillation   . Glaucoma     Past Surgical History  Procedure Date  . Cabg x 3 01/22/2011    Dr Laneta Simmers  . Cardiac catheterization 01/17/2011    Dr Alger Simons  . S/p myocardial infarction and pci of the lt circumflex 2000  . S/p aortobifemoral bypass 2008    Dr Arbie Cookey  . Abdominal hysterectomy     endometriosis    Family History  Problem Relation Age of Onset  . Heart attack Father   . Diabetes    . Hypertension    . Coronary artery disease    . Thyroid disease      sisters- sister had parathyroidectomy  . Alcohol abuse      brother    History  Substance Use Topics  . Smoking status: Current Some Day Smoker -- 1.0 packs/day    Types: Cigarettes   Last Attempt to Quit: 01/18/2011  . Smokeless tobacco: Never Used  . Alcohol Use: No    OB History    Grav Para Term Preterm Abortions TAB SAB Ect Mult Living                  Review of Systems  Gastrointestinal: Positive for abdominal pain and hematochezia.  All other systems reviewed and are negative.    Allergies  Metformin and related and Januvia  Home Medications   Current Outpatient Rx  Name Route Sig Dispense Refill  . ASPIRIN 325 MG PO TBEC Oral Take 325 mg by mouth every morning.     . ATORVASTATIN CALCIUM 80 MG PO TABS Oral Take 80 mg by mouth every evening.    Marland Kitchen VITAMIN D 1000 UNITS PO TABS Oral Take 1,000 Units by mouth every morning.    . FENOFIBRATE MICRONIZED 200 MG PO CAPS Oral Take 200 mg by mouth every evening.    Marland Kitchen GLIMEPIRIDE 2 MG PO TABS Oral Take 1.5 tablets (3 mg total) by mouth daily before breakfast. Or as directed 90 tablet 3  . ALBUTEROL SULFATE HFA 108 (90 BASE) MCG/ACT IN AERS Inhalation Inhale 2 puffs into the lungs every 6 (six) hours as needed.      Marland Kitchen NITROGLYCERIN 0.4 MG SL SUBL Sublingual Place 1 tablet (0.4 mg total) under the tongue  every 5 (five) minutes as needed for chest pain. 25 tablet 4  . VITAMIN D (ERGOCALCIFEROL) PO Oral Take 1 tablet by mouth daily.       BP 153/82  Pulse 84  Temp 97.8 F (36.6 C) (Oral)  Resp 20  SpO2 98%  Physical Exam  Nursing note and vitals reviewed. Constitutional: She is oriented to person, place, and time. She appears well-developed and well-nourished.  Non-toxic appearance. No distress.  HENT:  Head: Normocephalic and atraumatic.  Eyes: Conjunctivae, EOM and lids are normal. Pupils are equal, round, and reactive to light.  Neck: Normal range of motion. Neck supple. No tracheal deviation present. No mass present.  Cardiovascular: Normal rate, regular rhythm and normal heart sounds.  Exam reveals no gallop.   No murmur heard. Pulmonary/Chest: Effort normal and breath sounds normal. No stridor. No  respiratory distress. She has no decreased breath sounds. She has no wheezes. She has no rhonchi. She has no rales.  Abdominal: Soft. Normal appearance and bowel sounds are normal. She exhibits no distension. There is tenderness in the left lower quadrant. There is no rebound and no CVA tenderness.    Genitourinary:       Gross blood noted  Musculoskeletal: Normal range of motion. She exhibits no edema and no tenderness.  Neurological: She is alert and oriented to person, place, and time. She has normal strength. No cranial nerve deficit or sensory deficit. GCS eye subscore is 4. GCS verbal subscore is 5. GCS motor subscore is 6.  Skin: Skin is warm and dry. No abrasion and no rash noted.  Psychiatric: She has a normal mood and affect. Her speech is normal and behavior is normal.    ED Course  Procedures (including critical care time)  Labs Reviewed  CBC - Abnormal; Notable for the following:    Hemoglobin 15.7 (*)     All other components within normal limits  URINALYSIS, ROUTINE W REFLEX MICROSCOPIC  COMPREHENSIVE METABOLIC PANEL   No results found.   No diagnosis found.    MDM  Patient started on Cipro and Flagyl for her suspected diverticulitis. She will be admitted by [facility]        Toy Baker, MD 01/31/12 1517

## 2012-02-01 LAB — BASIC METABOLIC PANEL
BUN: 12 mg/dL (ref 6–23)
CO2: 24 mEq/L (ref 19–32)
Chloride: 107 mEq/L (ref 96–112)
GFR calc non Af Amer: 76 mL/min — ABNORMAL LOW (ref >90)
Glucose, Bld: 112 mg/dL — ABNORMAL HIGH (ref 70–99)
Potassium: 4.1 mEq/L (ref 3.5–5.1)

## 2012-02-01 LAB — CBC
HCT: 45.5 % (ref 36.0–46.0)
Hemoglobin: 14.8 g/dL (ref 12.0–15.0)
MCH: 29.7 pg (ref 26.0–34.0)
MCHC: 32.5 g/dL (ref 30.0–36.0)
MCV: 91.4 fL (ref 78.0–100.0)
RBC: 4.98 MIL/uL (ref 3.87–5.11)

## 2012-02-01 LAB — GLUCOSE, CAPILLARY: Glucose-Capillary: 97 mg/dL (ref 70–99)

## 2012-02-01 MED ORDER — METRONIDAZOLE 500 MG PO TABS: 500.0000 mg | ORAL_TABLET | Freq: Three times a day (TID) | ORAL | Status: AC

## 2012-02-01 MED ORDER — CIPROFLOXACIN HCL 500 MG PO TABS: 500.0000 mg | ORAL_TABLET | Freq: Two times a day (BID) | ORAL | Status: AC

## 2012-02-01 NOTE — Progress Notes (Signed)
Pt a/o, skin intact, IV left a/c d/c without complications, Telemetry d/c, discharge instructions discussed, copy given with Rx X 2 given, diet and medications discussed, when to call MD discussed, followup appts discussed.  Pt understands, no questions.  Via wheelchair escorted to meet husband for ride home.

## 2012-02-01 NOTE — Discharge Summary (Signed)
Physician Discharge Summary  Kimberly [name] ZOX:[id number] DOB: Jan 08, 1951 DOA: 01/31/2012  PCP: Lorretta Harp, MD  Admit date: 01/31/2012 Discharge date: 02/01/2012  Recommendations for Outpatient Follow-up:  1. Followup in the office to make sure her abdominal pain is resolved 2. Patient will need colonoscopy when symptoms resolve completely 3. Patient was encouraged to set up a quit smoking date - please remind her of her promise  Discharge Diagnoses:  Colitis, indeterminate - ischemic versus infectious  DIABETES MELLITUS, TYPE II  CORONARY ARTERY DISEASE STATUS post coronary artery bypass grafting  PERIPHERAL VASCULAR DISEASE  History of ventricular fibrillation  Diabetes mellitus with peripheral vascular disease   Discharge Condition: Good, patient able tolerate a full liquid diet, hemodynamically stable, minimal abdominal pain, afebrile  Diet recommendation: Full liquid until symptoms resolved then advanced a heart healthy  Filed Weights   01/31/12 1642  Weight: 69.083 kg (152 lb 4.8 oz)    History of present illness:  61 yo woman with hx of ASCVD, CAD, tobacco abuse, presents to the ED with LLQ abd pain and hematochezia through the night. No fevers , no chills. In the ED CT scan indicates colitis, without perforation and with intact blood flow to the colon.    Hospital Course:  1. Colitis - symptomatology, and CT scan findings point towards ischemic colitis. There is also possibility of acute diverticulitis. The patient was started on liquid diet and IV antibiotics. After 24 hours of observation the frequency of bloody stools decreased, she remained afebrile, she had a stable hemoglobin and her abdominal pain decreased. Plan is to continue ciprofloxacin and Flagyl for 10 more days and to followup with primary care physician and then get outpatient colonoscopy when symptoms resolve completely. The patient was instructed to report back to the emergency room if she has severe  abdominal pain, large amount of rectal bleeding or fever 2. Tobacco abuse - counseled extensively. She promised to set up a quit date in September  Procedures:  CT scan abdomen and pelvis  Consultations:  None  Discharge Exam: Filed Vitals:   02/01/12 0454  BP: 106/68  Pulse: 65  Temp: 98 F (36.7 C)  Resp: 17   Filed Vitals:   01/31/12 1546 01/31/12 1642 01/31/12 2105 02/01/12 0454  BP: 139/64 124/76 118/71 106/68  Pulse: 66 70 106 65  Temp: 98.7 F (37.1 C) 98.1 F (36.7 C) 98.2 F (36.8 C) 98 F (36.7 C)  TempSrc: Oral  Oral Oral  Resp: 18 18 18 17   Height:  5\' 3"  (1.6 m)    Weight:  69.083 kg (152 lb 4.8 oz)    SpO2: 98% 97% 94% 97%    General: Alert oriented x3 Cardiovascular: Regular rate and rhythm Respiratory: Clear to auscultation bilaterally  Discharge Instructions  Discharge Orders    Future Appointments: Provider: Department: Dept Phone: Center:   02/23/2012 8:30 AM [facility] [phone] None   02/24/2012 8:00 AM [facility] [phone] None   03/04/2012 9:15 AM [facility] [phone] [facility]     Future Orders Please Complete By Expires   Diet full liquid      Scheduling Instructions:   Advance to regular diet once pain and bleeding have stopped   Increase activity slowly        Medication List  As of 02/01/2012 10:17 AM   TAKE these medications         albuterol 108 (90 BASE) MCG/ACT inhaler   Commonly known as: PROVENTIL HFA;VENTOLIN  HFA   Inhale 2 puffs into the lungs every 6 (six) hours as needed. For shortness of breath or wheezing      aspirin 325 MG EC tablet   Take 325 mg by mouth every morning.      atorvastatin 80 MG tablet   Commonly known as: LIPITOR   Take 80 mg by mouth every evening.      cholecalciferol 1000 UNITS tablet   Commonly known as: VITAMIN D   Take 1,000 Units by mouth every morning.      ciprofloxacin 500 MG tablet   Commonly known as: CIPRO   Take 1 tablet (500 mg  total) by mouth 2 (two) times daily.      fenofibrate micronized 200 MG capsule   Commonly known as: LOFIBRA   Take 200 mg by mouth every evening.      glimepiride 2 MG tablet   Commonly known as: AMARYL   Take 1.5 tablets (3 mg total) by mouth daily before breakfast. Or as directed      metroNIDAZOLE 500 MG tablet   Commonly known as: FLAGYL   Take 1 tablet (500 mg total) by mouth 3 (three) times daily.      nitroGLYCERIN 0.4 MG SL tablet   Commonly known as: NITROSTAT   Place 1 tablet (0.4 mg total) under the tongue every 5 (five) minutes as needed for chest pain.      STOOL SOFTENER PO   Take 1 capsule by mouth daily.              The results of significant diagnostics from this hospitalization (including imaging, microbiology, ancillary and laboratory) are listed below for reference.    Significant Diagnostic Studies: Ct Abdomen Pelvis W Contrast  01/31/2012  *RADIOLOGY REPORT*  Clinical Data: Left lower quadrant pain.  Rectal bleeding.  CT ABDOMEN AND PELVIS WITH CONTRAST  Technique:  Multidetector CT imaging of the abdomen and pelvis was performed following the standard protocol during bolus administration of intravenous contrast.  Contrast: OMNIPAQUE IOHEXOL 300 MG/ML  SOLN  Comparison: 12/07/2008  Findings: There is marked Helfrich thickening of the sigmoid colon, rectum, and upper anus.  Low density in the [place] is compatible with edema.  No pneumatosis.  There is stranding in the adjacent fat compatible with inflammatory changes.  No extraluminal bowel gas. No abscess.  Aorto bifemoral bypass graft is in place.  Graft and limbs are widely patent.  Atherosclerotic changes of the aorta above the proximal anastomosis are present.  SMA is patent.  Celiac is patent.  Branch vessels of the IMA are grossly patent.  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  Uterus is absent.  Adnexa are unremarkable.  Tiny hypodensity at the dome of the liver is stable.  Normal  appendix.  Gallbladder, spleen, pancreas, adrenal glands are within normal limits.  Simple right renal cyst.  Lobulation of the kidneys most consistent with scarring.  IMPRESSION: There is Carlino thickening and inflammatory change of the sigmoid colon and rectum in a continuous fashion.  No evidence of abscess or extraluminal bowel gas to suggest rupture.  Differential diagnosis includes pseudomembranous colitis, ulcerative colitis, diverticulitis.  Ischemic changes a consideration however branch vessels of the IMA are grossly patent.  Occasionally, neoplasm can have a similar appearance.  Correlate clinically as for the need for follow-up imaging or colonoscopy.  Original Report Authenticated By: Donavan Burnet, M.D.    Microbiology: No results found for this or any previous visit (from  the past 240 hour(s)).   Labs: Basic Metabolic Panel:  Lab 02/01/12 4540 01/31/12 1042  NA 141 140  K 4.1 4.0  CL 107 105  CO2 24 24  GLUCOSE 112* 90  BUN 12 18  CREATININE 0.82 0.77  CALCIUM 10.3 10.7*  MG -- --  PHOS -- --   Liver Function Tests:  Lab 01/31/12 1042  AST 25  ALT 28  ALKPHOS 74  BILITOT 0.5  PROT 7.7  ALBUMIN 4.2   No results found for this basename: LIPASE:5,AMYLASE:5 in the last 168 hours No results found for this basename: AMMONIA:5 in the last 168 hours CBC:  Lab 02/01/12 0630 01/31/12 1042  WBC 7.9 8.5  NEUTROABS -- --  HGB 14.8 15.7*  HCT 45.5 45.3  MCV 91.4 89.0  PLT 180 188   Cardiac Enzymes: No results found for this basename: CKTOTAL:5,CKMB:5,CKMBINDEX:5,TROPONINI:5 in the last 168 hours BNP: BNP (last 3 results) No results found for this basename: PROBNP:3 in the last 8760 hours CBG:  Lab 02/01/12 0739 01/31/12 2159 01/31/12 2110 01/31/12 1638 01/31/12 1603  GLUCAP 97 143* 60* 143* 120*    Time coordinating discharge: 45 minutes  Signed:  Teyah Rossy  [facility] 02/01/2012, 10:17 AM

## 2012-02-10 ENCOUNTER — Ambulatory Visit (INDEPENDENT_AMBULATORY_CARE_PROVIDER_SITE_OTHER): Payer: PRIVATE HEALTH INSURANCE | Admitting: Internal Medicine

## 2012-02-10 ENCOUNTER — Encounter: Payer: Self-pay | Admitting: Internal Medicine

## 2012-02-10 VITALS — BP 140/84 | HR 72 | Temp 98.3°F | Wt 152.0 lb

## 2012-02-10 DIAGNOSIS — I798 Other disorders of arteries, arterioles and capillaries in diseases classified elsewhere: Secondary | ICD-10-CM

## 2012-02-10 DIAGNOSIS — K5732 Diverticulitis of large intestine without perforation or abscess without bleeding: Secondary | ICD-10-CM

## 2012-02-10 DIAGNOSIS — I2510 Atherosclerotic heart disease of native coronary artery without angina pectoris: Secondary | ICD-10-CM

## 2012-02-10 DIAGNOSIS — K523 Indeterminate colitis: Secondary | ICD-10-CM

## 2012-02-10 DIAGNOSIS — K5289 Other specified noninfective gastroenteritis and colitis: Secondary | ICD-10-CM

## 2012-02-10 DIAGNOSIS — E1151 Type 2 diabetes mellitus with diabetic peripheral angiopathy without gangrene: Secondary | ICD-10-CM

## 2012-02-10 DIAGNOSIS — I10 Essential (primary) hypertension: Secondary | ICD-10-CM

## 2012-02-10 DIAGNOSIS — E1159 Type 2 diabetes mellitus with other circulatory complications: Secondary | ICD-10-CM

## 2012-02-10 DIAGNOSIS — K5792 Diverticulitis of intestine, part unspecified, without perforation or abscess without bleeding: Secondary | ICD-10-CM | POA: Insufficient documentation

## 2012-02-10 DIAGNOSIS — F17200 Nicotine dependence, unspecified, uncomplicated: Secondary | ICD-10-CM

## 2012-02-10 HISTORY — DX: Diverticulitis of intestine, part unspecified, without perforation or abscess without bleeding: K57.92

## 2012-02-10 NOTE — Patient Instructions (Signed)
Will get gi referral .  Advance diet slowly .  Continue monitor blood sugars   Try going back down to 2 mg per day  If goes high again increase to 3 mg per day. Want to avoid hyperglycemia.   Keep lab appt  But done need to have  Fu OV with that  Will review and then plan ov fu .

## 2012-02-10 NOTE — Progress Notes (Signed)
Subjective:    Patient ID: Kimberly [name], female    DOB: 02/11/1951, 61 y.o.   MRN: [id number]  HPI Patient comes in today for followup of hospitalization for colitis presenting as hematochezia and abdominal pain. Differential diagnosis is diverticulitis versus ischemic colitis. She was admitted 01/31/2012 when she presented with bloody stool and abdominal pain discharged 819 after evaluation. Was begun on IV antibiotics liquid diet and discharged on Cipro and Flagyl of which she has one more day left. She currently feels well in this area has had no abdominal pain for least the last 3 days no bloody stool since the first day or 2.  Her blood sugars were somewhat low when the hospital she is taking to 3 mg of Amaryl and sometimes getting lows in the 70s or so and has a snack. She is staying on a soft diet at this time.  Her tobacco is down to 2 cigarettes a day. She is hesitant to go to a specialist her do a colonoscopy because of the cost. She has a high deductible and has to pay higher when she goes to specialist. Hard of this is the reasoning reason she has never had a colonoscopy. We have discussed this before Review of Systems Negative for chest pain shortness of breath syncope no acute swelling unusual bleeding hypotension.  Outpatient Encounter Prescriptions as of 02/10/2012  Medication Sig Dispense Refill  . albuterol (PROVENTIL HFA;VENTOLIN HFA) 108 (90 BASE) MCG/ACT inhaler Inhale 2 puffs into the lungs every 6 (six) hours as needed. For shortness of breath or wheezing      . aspirin 325 MG EC tablet Take 325 mg by mouth every morning.       Marland Kitchen atorvastatin (LIPITOR) 80 MG tablet Take 80 mg by mouth every evening.      . cholecalciferol (VITAMIN D) 1000 UNITS tablet Take 1,000 Units by mouth every morning.      . ciprofloxacin (CIPRO) 500 MG tablet Take 1 tablet (500 mg total) by mouth 2 (two) times daily.  20 tablet  0  . Docusate Calcium (STOOL SOFTENER PO) Take 1 capsule by mouth  daily.      . fenofibrate micronized (LOFIBRA) 200 MG capsule Take 200 mg by mouth every evening.      Marland Kitchen glimepiride (AMARYL) 2 MG tablet Take 1.5 tablets (3 mg total) by mouth daily before breakfast. Or as directed  90 tablet  3  . metroNIDAZOLE (FLAGYL) 500 MG tablet Take 1 tablet (500 mg total) by mouth 3 (three) times daily.  30 tablet  0  . nitroGLYCERIN (NITROSTAT) 0.4 MG SL tablet Place 1 tablet (0.4 mg total) under the tongue every 5 (five) minutes as needed for chest pain.  25 tablet  4   Past history family history social history reviewed in the electronic medical record.     Objective:   Physical Exam BP 140/84  Pulse 72  Temp 98.3 F (36.8 C) (Oral)  Wt 152 lb (68.947 kg)  SpO2 97% Well-developed well-nourished in no acute distress she looks well today chest clear to auscultation and percussion cardiac S1-S2 no gallops murmurs peripheral pulses are present negative edema. Abdomen soft no bruits are heard no guarding rebound or hepatosplenomegaly.  Review of hospitalization notes noted as well as CT report which showed thickening and inflammatory change in the sigmoid colon and rectum differential diagnosis was pseudomembranous colitis ulcerative colitis diverticulitis or ischemic changes. Cannot rule out neoplasm. Lab Results  Component Value Date   WBC  7.9 02/01/2012   HGB 14.8 02/01/2012   HCT 45.5 02/01/2012   PLT 180 02/01/2012   GLUCOSE 112* 02/01/2012   CHOL 140 12/16/2011   TRIG 243.0* 12/16/2011   HDL 20.00* 12/16/2011   LDLDIRECT 88.0 12/16/2011   LDLCALC 80 01/16/2011   ALT 28 01/31/2012   AST 25 01/31/2012   NA 141 02/01/2012   K 4.1 02/01/2012   CL 107 02/01/2012   CREATININE 0.82 02/01/2012   BUN 12 02/01/2012   CO2 24 02/01/2012   TSH 4.18 10/01/2011   INR 1.06 02/01/2012   HGBA1C 7.1* 12/16/2011   MICROALBUR 1.0 03/17/2011       Assessment & Plan:  Status post hospitalization for colitis with abdominal pain and rectal bleeding completing treatment for diverticulitis  significantly improved without symptoms today by report. Finish out medication low residue soft diet increase as tolerated. Discussed importance of followup evaluation of the situation as the cause of her colitis is not clear at this time. She is cutting down her smoking down to 2 a day but needs to stop. May have already  met her deductible this year anyway I recommended she see gastroenterology will refer.  Diabetes mellitus with vascular disease presents information that her control is better but want to avoid hypoglycemia. She's due for labs in September we will cancel the office visit associated with his for cost reasons unless we are unable to manage over the phone.  She will go down to 2 mg a day and monitor her blood sugars are creeping up again can go back up to 3 mg per per for decreasing medicine is supposed to frequent and increase snacking to avoid hypoglycemia.  Hypertension I do not think she has had hypotensive episodes to trigger a colitis episode.  Vascular disease coronary artery disease peripheral vascular disease stable. Minimal but daily tobacco    Encouraged to DC  Modifiable risk factor.

## 2012-02-11 ENCOUNTER — Encounter: Payer: Self-pay | Admitting: Internal Medicine

## 2012-02-24 ENCOUNTER — Other Ambulatory Visit: Payer: Self-pay | Admitting: *Deleted

## 2012-02-24 DIAGNOSIS — R609 Edema, unspecified: Secondary | ICD-10-CM

## 2012-03-04 ENCOUNTER — Other Ambulatory Visit (INDEPENDENT_AMBULATORY_CARE_PROVIDER_SITE_OTHER): Payer: PRIVATE HEALTH INSURANCE

## 2012-03-04 DIAGNOSIS — E785 Hyperlipidemia, unspecified: Secondary | ICD-10-CM

## 2012-03-04 LAB — LDL CHOLESTEROL, DIRECT: Direct LDL: 122.7 mg/dL

## 2012-03-04 LAB — LIPID PANEL
HDL: 18.5 mg/dL — ABNORMAL LOW (ref >39.00)
Total CHOL/HDL Ratio: 10
VLDL: 61.4 mg/dL — ABNORMAL HIGH (ref 0.0–40.0)

## 2012-03-04 LAB — HEMOGLOBIN A1C: Hgb A1c MFr Bld: 6 % (ref 4.6–6.5)

## 2012-03-09 ENCOUNTER — Encounter: Payer: Self-pay | Admitting: Internal Medicine

## 2012-03-10 ENCOUNTER — Encounter: Payer: Self-pay | Admitting: Internal Medicine

## 2012-03-10 ENCOUNTER — Ambulatory Visit (INDEPENDENT_AMBULATORY_CARE_PROVIDER_SITE_OTHER): Payer: PRIVATE HEALTH INSURANCE | Admitting: Internal Medicine

## 2012-03-10 VITALS — BP 142/70 | HR 80 | Ht 64.0 in | Wt 153.0 lb

## 2012-03-10 DIAGNOSIS — R1032 Left lower quadrant pain: Secondary | ICD-10-CM

## 2012-03-10 DIAGNOSIS — K625 Hemorrhage of anus and rectum: Secondary | ICD-10-CM

## 2012-03-10 DIAGNOSIS — K5289 Other specified noninfective gastroenteritis and colitis: Secondary | ICD-10-CM

## 2012-03-10 DIAGNOSIS — K529 Noninfective gastroenteritis and colitis, unspecified: Secondary | ICD-10-CM

## 2012-03-10 DIAGNOSIS — R933 Abnormal findings on diagnostic imaging of other parts of digestive tract: Secondary | ICD-10-CM

## 2012-03-10 MED ORDER — PEG-KCL-NACL-NASULF-NA ASC-C 100 G PO SOLR: 1.0000 | Freq: Once | ORAL | Status: DC

## 2012-03-10 NOTE — Patient Instructions (Addendum)
You have been scheduled for a colonoscopy with propofol. Please follow written instructions given to you at your visit today.  Please pick up your prep kit at the pharmacy within the next 1-3 days. If you use inhalers (even only as needed), please bring them with you on the day of your procedure.  We have sent the following medications to your pharmacy for you to pick up at your convenience: Moviprep   

## 2012-03-10 NOTE — Progress Notes (Signed)
Patient ID: Kimberly [name], female   DOB: Apr 15, 1951, 61 y.o.   MRN: [id number]  SUBJECTIVE: HPI Kimberly [name] is a 61 year old female with a history of CAD status post CABG, diabetes, hypertension, peripheral rash or disease, COPD who is seen in consultation at the request of Dr. Fabian Sharp for evaluation of an episode of abdominal pain with bloody stools and abnormal GI imaging.  The patient reports in mid-August she developed 48 hours of severe left-sided abdominal pain followed by bloody diarrhea. This prompted an ER visit and 01/31/2012. At this time she had a CT of her abdomen which revealed Mallis thickening with inflammatory changes in the sigmoid colon and rectum. She was treated for the pain and discharged on a liquid diet with oral antibiotics. She reports the bloody diarrhea lasted only 48 hours, but the left sided and left lower quadrant abdominal pain gradually improved over a period of 10 days. The pain has completely resolved now and her stools have returned to normal for her. She reports she is now having 2-3 formed brown stools daily. She denies further diarrhea, rectal bleeding or melena. She has no nausea or vomiting. She's had a good appetite. No weight loss. No heartburn. No dysphagia or odynophagia. She reports approximately 2 or 3 years ago having had an episode of bloody diarrhea but was told she had a "blockage". This also resolved quickly. With the prior episode, she does not remember pain. She denies fevers or chills. She has never had a colonoscopy.  Review of Systems  As per history of present illness, otherwise negative   Past Medical History  Diagnosis Date  . CAD (coronary artery disease)   . DM (diabetes mellitus)   . HTN (hypertension)   . Glaucoma   . PVD (peripheral vascular disease)   . Osteoporosis   . COPD (chronic obstructive pulmonary disease)   . History of ventricular fibrillation   . Glaucoma   . Colitis     Current Outpatient Prescriptions  Medication Sig  Dispense Refill  . albuterol (PROVENTIL HFA;VENTOLIN HFA) 108 (90 BASE) MCG/ACT inhaler Inhale 2 puffs into the lungs every 6 (six) hours as needed. For shortness of breath or wheezing      . aspirin 325 MG EC tablet Take 325 mg by mouth every morning.       Marland Kitchen atorvastatin (LIPITOR) 80 MG tablet Take 80 mg by mouth every evening.      . cholecalciferol (VITAMIN D) 1000 UNITS tablet Take 1,000 Units by mouth every morning.      Tery Sanfilippo Calcium (STOOL SOFTENER PO) Take 1 capsule by mouth daily.      . fenofibrate micronized (LOFIBRA) 200 MG capsule Take 200 mg by mouth every evening.      Marland Kitchen glimepiride (AMARYL) 2 MG tablet Take 1.5 tablets (3 mg total) by mouth daily before breakfast. Or as directed  90 tablet  3  . nitroGLYCERIN (NITROSTAT) 0.4 MG SL tablet Place 1 tablet (0.4 mg total) under the tongue every 5 (five) minutes as needed for chest pain.  25 tablet  4    Allergies  Allergen Reactions  . Metformin And Related Nausea And Vomiting    After one pill at hospital  . Januvia (Sitagliptin Phosphate) Other (See Comments)    Swelling and edema see 4 13 OV    Family History  Problem Relation Age of Onset  . Heart attack Father   . Diabetes Mother   . Hypertension    . Thyroid disease  sisters- sister had parathyroidectomy  . Alcohol abuse      brother  . Colon polyps Maternal Uncle   . Liver cancer Mother   . Breast cancer Sister   --unclear if her mother had cirrhosis --No family history of colorectal cancer or IBD  History  Substance Use Topics  . Smoking status: Current Every Day Smoker -- 0.4 packs/day    Types: Cigarettes  . Smokeless tobacco: Never Used  . Alcohol Use: No    OBJECTIVE: BP 142/70  Pulse 80  Ht 5\' 4"  (1.626 m)  Wt 153 lb (69.4 kg)  BMI 26.26 kg/m2 Constitutional: Well-developed and well-nourished. No distress. HEENT: Normocephalic and atraumatic. Oropharynx is clear and moist. No oropharyngeal exudate. Conjunctivae are normal. Pupils are  equal round and reactive to light. No scleral icterus. Neck: Neck supple. Trachea midline. Cardiovascular: Normal rate, regular rhythm and intact distal pulses. No M/R/G, well-healed sternal scar Pulmonary/chest: Effort normal and breath sounds normal. No wheezing, rales or rhonchi. Abdominal: Soft, nontender, nondistended. Bowel sounds active throughout. There are no masses palpable. No hepatosplenomegaly. Well-healed laparotomy scar Extremities: no clubbing, cyanosis, or edema Lymphadenopathy: No cervical adenopathy noted. Neurological: Alert and oriented to person place and time. Skin: Skin is warm and dry. No rashes noted. Psychiatric: Normal mood and affect. Behavior is normal.  Labs and Imaging -- CBC    Component Value Date/Time   WBC 7.9 02/01/2012 0630   RBC 4.98 02/01/2012 0630   HGB 14.8 02/01/2012 0630   HCT 45.5 02/01/2012 0630   PLT 180 02/01/2012 0630   MCV 91.4 02/01/2012 0630   MCH 29.7 02/01/2012 0630   MCHC 32.5 02/01/2012 0630   RDW 14.0 02/01/2012 0630   LYMPHSABS 1.8 10/01/2011 1217   MONOABS 0.6 10/01/2011 1217   EOSABS 0.3 10/01/2011 1217   BASOSABS 0.0 10/01/2011 1217    CMP     Component Value Date/Time   NA 141 02/01/2012 0630   K 4.1 02/01/2012 0630   CL 107 02/01/2012 0630   CO2 24 02/01/2012 0630   GLUCOSE 112* 02/01/2012 0630   BUN 12 02/01/2012 0630   CREATININE 0.82 02/01/2012 0630   CALCIUM 10.3 02/01/2012 0630   CALCIUM 10.5 06/19/2011 0831   PROT 7.7 01/31/2012 1042   ALBUMIN 4.2 01/31/2012 1042   AST 25 01/31/2012 1042   ALT 28 01/31/2012 1042   ALKPHOS 74 01/31/2012 1042   BILITOT 0.5 01/31/2012 1042   GFRNONAA 76* 02/01/2012 0630   GFRAA 88* 02/01/2012 0630    CT ABDOMEN AND PELVIS WITH CONTRAST   Technique:  Multidetector CT imaging of the abdomen and pelvis was performed following the standard protocol during bolus administration of intravenous contrast.   Contrast: OMNIPAQUE IOHEXOL 300 MG/ML  SOLN   Comparison: 12/07/2008   Findings:  There is marked Mode thickening of the sigmoid colon, rectum, and upper anus.  Low density in the Posey is compatible with edema.  No pneumatosis.  There is stranding in the adjacent fat compatible with inflammatory changes.  No extraluminal bowel gas. No abscess.   Aorto bifemoral bypass graft is in place.  Graft and limbs are widely patent.  Atherosclerotic changes of the aorta above the proximal anastomosis are present.  SMA is patent.  Celiac is patent.  Branch vessels of the IMA are grossly patent.   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   Uterus is absent.  Adnexa are unremarkable.   Tiny hypodensity at the dome of the liver is stable.  Normal appendix.  Gallbladder, spleen, pancreas, adrenal glands are within normal limits.  Simple right renal cyst.  Lobulation of the kidneys most consistent with scarring.   IMPRESSION: There is Soward thickening and inflammatory change of the sigmoid colon and rectum in a continuous fashion.  No evidence of abscess or extraluminal bowel gas to suggest rupture.  Differential diagnosis includes pseudomembranous colitis, ulcerative colitis, diverticulitis.  Ischemic changes a consideration however branch vessels of the IMA are grossly patent.  Occasionally, neoplasm can have a similar appearance.  Correlate clinically as for the need for follow-up imaging or colonoscopy.   ASSESSMENT AND PLAN:  61 year old female with a history of CAD status post CABG, diabetes, hypertension, peripheral rash or disease, COPD who is seen in consultation at the request of Dr. Fabian Sharp for evaluation of an episode of abdominal pain with bloody stools and abnormal GI imaging.  1.  Episode of LLQ pain, diarrhea, rectal bleeding, and abnl GI imaging -- the patient's abdominal complaints have entirely resolved at this point, which is reassuring. The top of the differential at this time is ischemic colitis followed less likely by infectious colitis,  inflammatory bowel disease, and malignancy. She had recent CBC which was normal and reassuring. I recommended colonoscopy for direct visualization, and we discussed the procedure including the risks and benefits in detail. She is agreeable to proceed. She prefers of this test be on Friday morning. The test will be scheduled for her, and she was asked to notify us immediately should her pain or bleeding return. She voices understanding.  2.  Tobacco use -- One of your biggest health concerns is your smoking.  This increases your risk for most cancers and serious cardiovascular diseases such as strokes, heart attacks.  You should try your best to stop.  If you need assistance, please contact your PCP or Smoking Cessation Class at [facility] [phone]) or [facility] Quit-Line (1-800-QUIT-NOW).

## 2012-03-16 ENCOUNTER — Telehealth: Payer: Self-pay | Admitting: Family Medicine

## 2012-03-16 NOTE — Telephone Encounter (Signed)
Pt's last labs revealed LDL in the 122 range, triglycerides up to 300 and HDL down to 19.  Pt stated she is taking medication daily.  Please advise.  Thanks!!!

## 2012-03-18 NOTE — Telephone Encounter (Signed)
If never on this and had no side effect we can change to crestor as a trial and see if this works better than lipitor 80 mg  This is a branded medication but is worth a trial to see if works better. See if we can get her samples of 20 mg of crestor   1 po qd and check lipids  In 6- 8 weeks    Then ROV  Stop the lipitor atorvastatin in the meantime

## 2012-03-19 ENCOUNTER — Other Ambulatory Visit: Payer: Self-pay | Admitting: Internal Medicine

## 2012-03-19 ENCOUNTER — Encounter: Payer: Self-pay | Admitting: Internal Medicine

## 2012-03-19 MED ORDER — ROSUVASTATIN CALCIUM 20 MG PO TABS: 20.0000 mg | ORAL_TABLET | Freq: Every day | ORAL | Status: DC

## 2012-03-19 NOTE — Telephone Encounter (Signed)
Pt.notified

## 2012-03-25 ENCOUNTER — Other Ambulatory Visit: Payer: Self-pay | Admitting: Internal Medicine

## 2012-03-31 ENCOUNTER — Ambulatory Visit (INDEPENDENT_AMBULATORY_CARE_PROVIDER_SITE_OTHER): Payer: PRIVATE HEALTH INSURANCE

## 2012-03-31 DIAGNOSIS — Z23 Encounter for immunization: Secondary | ICD-10-CM

## 2012-04-23 ENCOUNTER — Ambulatory Visit (AMBULATORY_SURGERY_CENTER): Payer: PRIVATE HEALTH INSURANCE | Admitting: Internal Medicine

## 2012-04-23 ENCOUNTER — Encounter: Payer: Self-pay | Admitting: Internal Medicine

## 2012-04-23 VITALS — BP 157/76 | HR 65 | Temp 97.6°F | Resp 15 | Ht 64.0 in | Wt 153.0 lb

## 2012-04-23 DIAGNOSIS — Z1211 Encounter for screening for malignant neoplasm of colon: Secondary | ICD-10-CM

## 2012-04-23 DIAGNOSIS — K5289 Other specified noninfective gastroenteritis and colitis: Secondary | ICD-10-CM

## 2012-04-23 DIAGNOSIS — D126 Benign neoplasm of colon, unspecified: Secondary | ICD-10-CM

## 2012-04-23 DIAGNOSIS — K523 Indeterminate colitis: Secondary | ICD-10-CM

## 2012-04-23 DIAGNOSIS — K625 Hemorrhage of anus and rectum: Secondary | ICD-10-CM

## 2012-04-23 LAB — GLUCOSE, CAPILLARY
Glucose-Capillary: 134 mg/dL — ABNORMAL HIGH (ref 70–99)
Glucose-Capillary: 84 mg/dL (ref 70–99)

## 2012-04-23 MED ORDER — SODIUM CHLORIDE 0.9 % IV SOLN: 500.0000 mL | INTRAVENOUS | Status: DC

## 2012-04-23 NOTE — Op Note (Signed)
[facility] [address] [place], [address]   COLONOSCOPY PROCEDURE REPORT  PATIENT: Kimberly [name], Kimberly [name]  MR#: [id number] BIRTHDATE: [birth year] , 61  yrs. old GENDER: Female ENDOSCOPIST: Beverley Fiedler, MD REFERRED XB:[id number], Neta Mends. PROCEDURE DATE:  04/23/2012 PROCEDURE:   Colonoscopy with biopsy, Colonoscopy with snare polypectomy, and Colonoscopy with cold biopsy polypectomy ASA CLASS:   Class III INDICATIONS:an abnormal CT, abdominal pain in the lower left quadrant, and rectal bleeding. MEDICATIONS: MAC sedation, administered by CRNA and Propofol (Diprivan) 240 mg IV  DESCRIPTION OF PROCEDURE:   After the risks benefits and alternatives of the procedure were thoroughly explained, informed consent was obtained.  A digital rectal exam revealed no rectal mass.   The LB [id number]  endoscope was introduced through the anus and advanced to the cecum, which was identified by both the appendix and ileocecal valve. No adverse events experienced. The quality of the prep was good, using MoviPrep  The instrument was then slowly withdrawn as the colon was fully examined.    COLON FINDINGS: Three sessile polyps ranging between 3-6 mm in size were found at the cecum, in the ascending colon, and at the hepatic flexure.  Polypectomy was performed with cold forceps (1) and using cold snare (2).  All resections were complete and all polyp tissue was completely retrieved.   Two sessile polyps measuring 5-7 mm in size were found in the sigmoid colon.  Polypectomy was performed using hot snare.  All resections were complete and all polyp tissue was completely retrieved.   Three sessile polyps measuring 3-4 mm and 9 mm in size were found in the rectum.  Polypectomy was performed with cold forceps (2) and using hot snare (1).  All resections were complete and all polyp tissue was completely retrieved.   There was moderate diverticulosis noted in the sigmoid colon with  associated muscular hypertrophy and angulation.   Mild erythema was found scattered throughout the entire examined colon. Multiple biopsies were performed using cold forceps from right and left colon. Retroflexed views revealed internal hemorrhoids. The time to cecum=10 minutes 30 seconds.  Withdrawal time=21 minutes 28 seconds.  The scope was withdrawn and the procedure completed.  COMPLICATIONS: There were no complications.           ENDOSCOPIC IMPRESSION: 1.   Three sessile polyps ranging between 3-95mm in size were found at the cecum, in the ascending colon, and at the hepatic flexure; Polypectomy was performed with cold forceps and using cold snare 2.   Two sessile polyps measuring 5-7 mm in size were found in the sigmoid colon; Polypectomy was performed using hot snare 3.   Three sessile polyps measuring 3-4 mm and 9 mm in size were found in the rectum; Polypectomy was performed with cold forceps and using hot snare 4.   There was moderate diverticulosis noted in the sigmoid colon 5.   Mild erythema was found throughout the entire examined colon; multiple biopsies were performed using cold forceps 6.   Small internal hemorrhoids  RECOMMENDATIONS: 1.  Hold aspirin, aspirin products, and anti-inflammatory medication for 1 week. 2.  Await pathology results 3.  High fiber diet 4.  Timing of repeat colonoscopy will be determined by pathology findings. 5.  You will receive a letter within 1-2 weeks with the results of your biopsy as well as final recommendations.  Please call my office if you have not received a letter after 3 weeks. 6.  One of your biggest health concerns  is your smoking.  This increases your risk for most cancers and serious cardiovascular diseases such as strokes, heart attacks.  You should try your best to stop.  If you need assistance, please contact your PCP or Smoking Cessation Class at [facility] [phone]) or [facility] Quit-Line  (1-800-QUIT-NOW).   eSigned:  Beverley Fiedler, MD 04/23/2012 9:03 AM   cc: Madelin Headings, MD and The Patient   PATIENT NAME:  Kimberly [name], Kimberly [name] MR#: [id number]

## 2012-04-23 NOTE — Progress Notes (Signed)
Patient did not experience any of the following events: a burn prior to discharge; a fall within the facility; wrong site/side/patient/procedure/implant event; or a hospital transfer or hospital admission upon discharge from the facility. (G8907) Patient did not have preoperative order for IV antibiotic SSI prophylaxis. (G8918)  

## 2012-04-23 NOTE — Progress Notes (Signed)
0902 a/ox3, pleased with anesthesia, report to [facility]

## 2012-04-23 NOTE — Patient Instructions (Addendum)

## 2012-04-26 ENCOUNTER — Telehealth: Payer: Self-pay

## 2012-04-26 NOTE — Telephone Encounter (Signed)
Unable to leave message line busy times three.

## 2012-04-28 ENCOUNTER — Encounter: Payer: Self-pay | Admitting: Internal Medicine

## 2012-05-04 ENCOUNTER — Other Ambulatory Visit (INDEPENDENT_AMBULATORY_CARE_PROVIDER_SITE_OTHER): Payer: PRIVATE HEALTH INSURANCE

## 2012-05-04 DIAGNOSIS — E785 Hyperlipidemia, unspecified: Secondary | ICD-10-CM

## 2012-05-04 LAB — LIPID PANEL
HDL: 25.1 mg/dL — ABNORMAL LOW (ref >39.00)
LDL Cholesterol: 98 mg/dL (ref 0–99)
Total CHOL/HDL Ratio: 6
VLDL: 38.4 mg/dL (ref 0.0–40.0)

## 2012-06-21 ENCOUNTER — Other Ambulatory Visit: Payer: Self-pay | Admitting: Internal Medicine

## 2012-06-21 ENCOUNTER — Encounter: Payer: Self-pay | Admitting: Vascular Surgery

## 2012-08-16 ENCOUNTER — Other Ambulatory Visit: Payer: Self-pay | Admitting: Family Medicine

## 2012-08-16 MED ORDER — GLIMEPIRIDE 2 MG PO TABS: 3.0000 mg | ORAL_TABLET | Freq: Every day | ORAL | Status: DC

## 2012-10-06 IMAGING — CT CT ABD-PELV W/ CM
2 of 5 series · 17 of 46 positions shown, 19 images · IV contrast (APPLIED)
Comparison: 12/07/2008

CLINICAL DATA: Left lower quadrant pain.  Rectal bleeding.

CT ABDOMEN AND PELVIS WITH CONTRAST
TECHNIQUE: Multidetector CT imaging of the abdomen and pelvis was
performed following the standard protocol during bolus
administration of intravenous contrast.
Contrast: 100mL OMNIPAQUE IOHEXOL 300 MG/ML  SOLN

[Series 2: abd/pelv with 5.0 b31f st · axial · 0.77mm/px · z∈[-368,+22]mm · 14 of 88 slices shown, 16 images]
[im 5/88  soft-tissue]
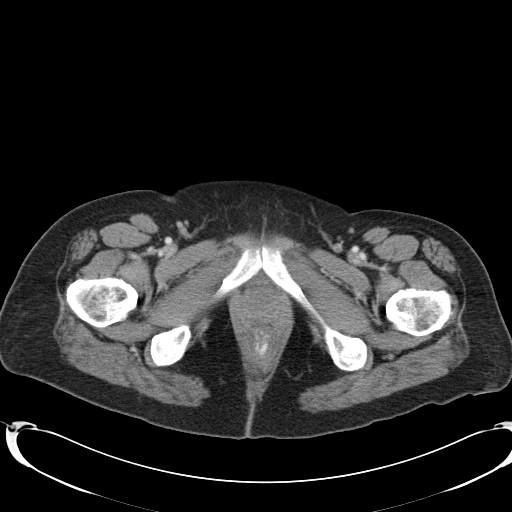
[im 5/88  bone]
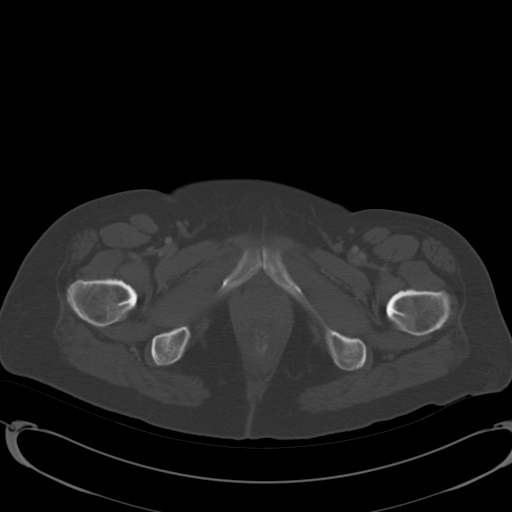
[im 10/88  soft-tissue]
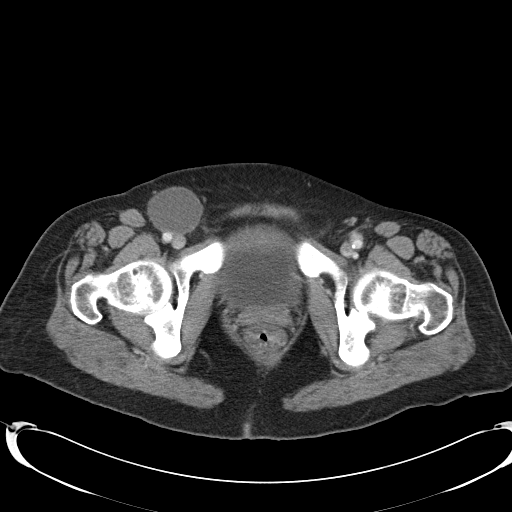
[im 19/88  soft-tissue]
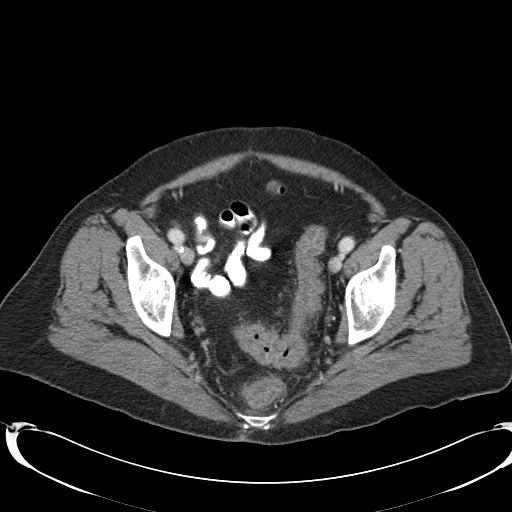
[im 23/88  soft-tissue]
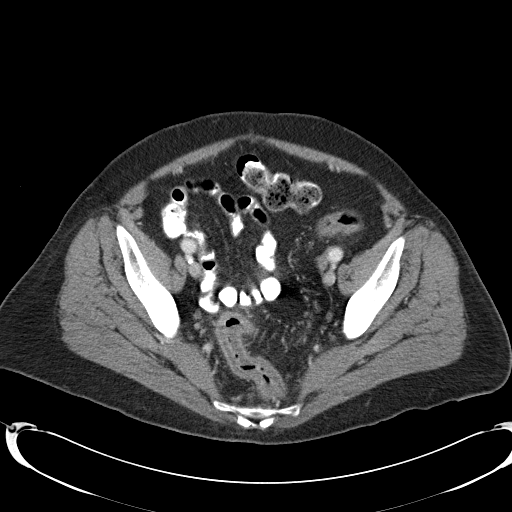
[im 28/88  soft-tissue]
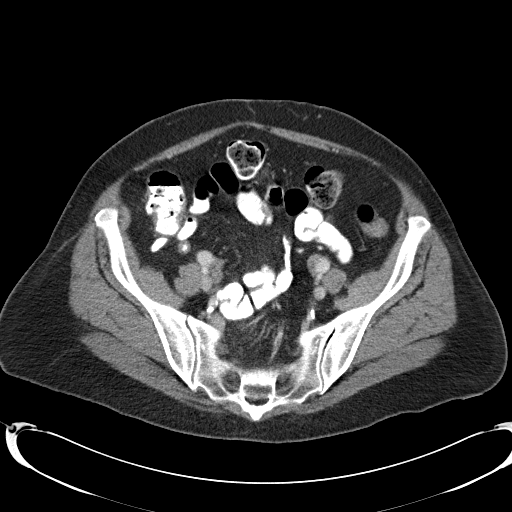
[im 37/88  soft-tissue]
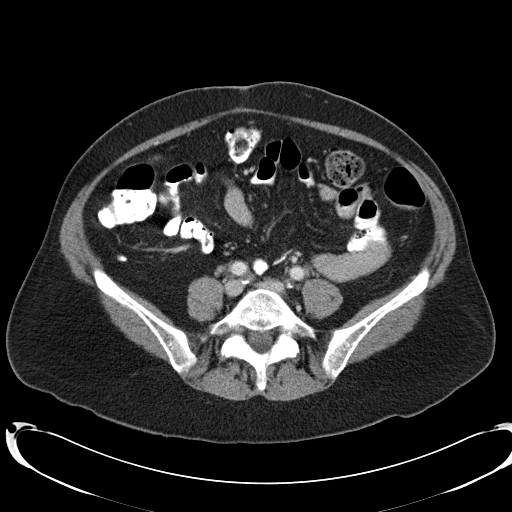
[im 42/88  soft-tissue]
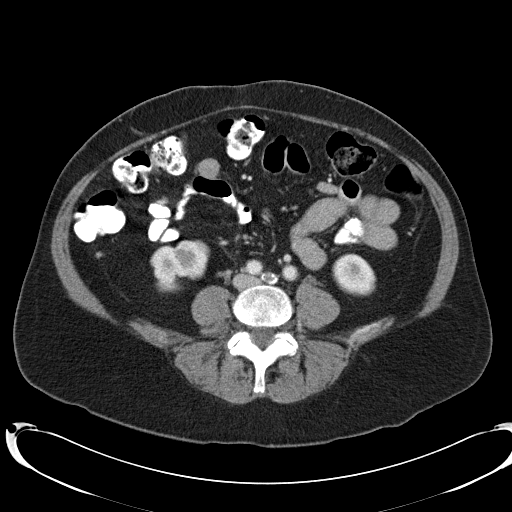
[im 46/88  soft-tissue]
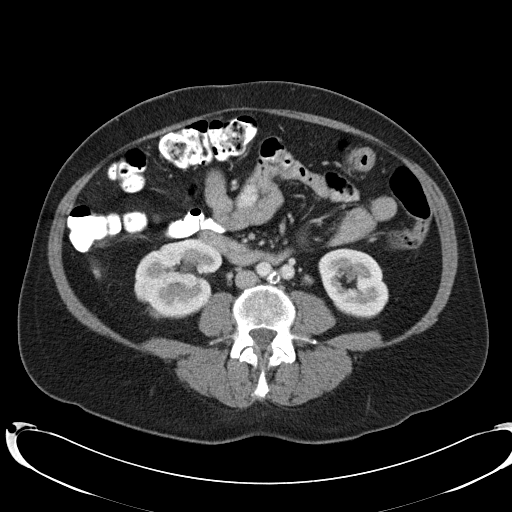
[im 51/88  soft-tissue]
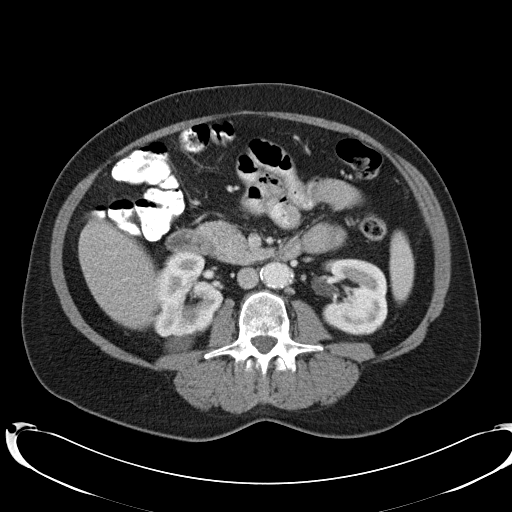
[im 51/88  bone]
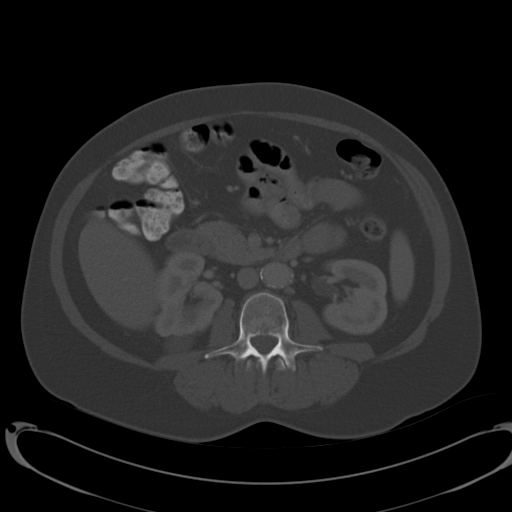
[im 60/88  soft-tissue]
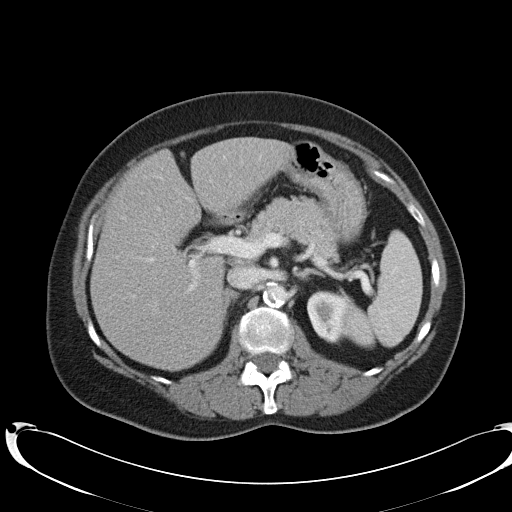
[im 65/88  soft-tissue]
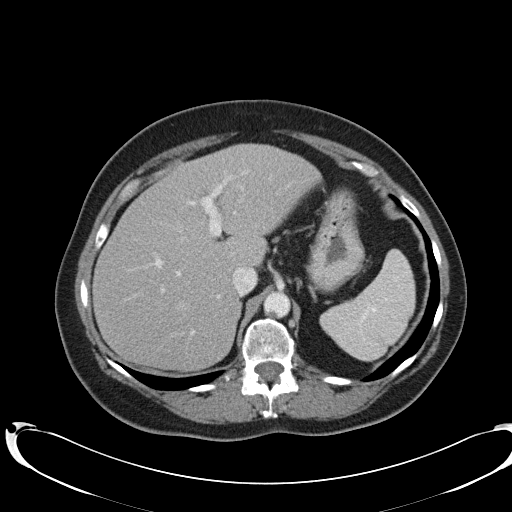
[im 69/88  soft-tissue]
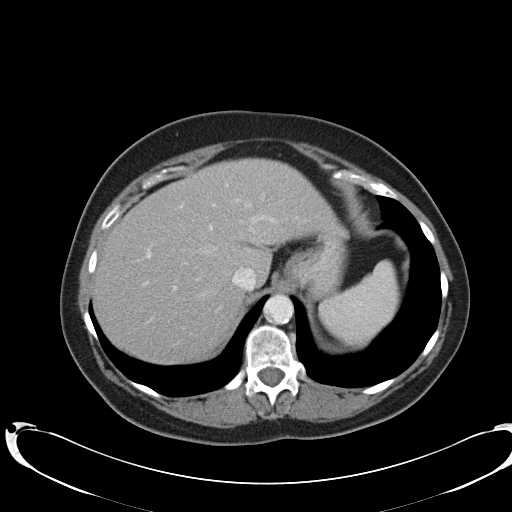
[im 78/88  soft-tissue]
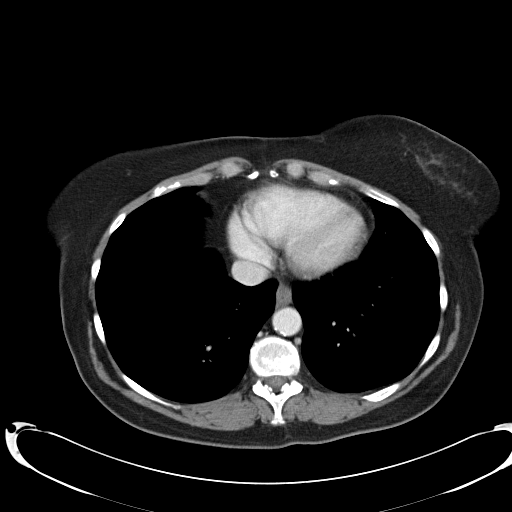
[im 83/88  soft-tissue]
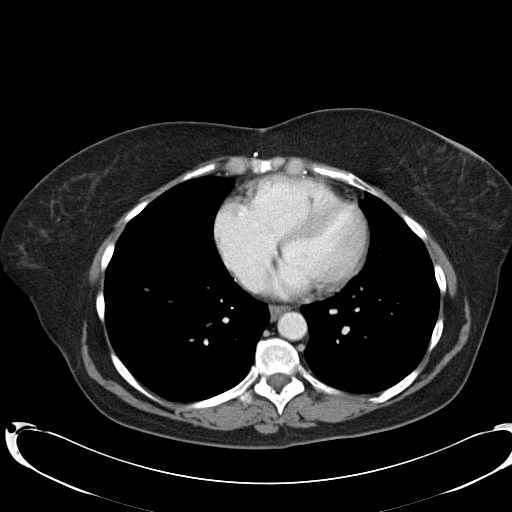

[Series 6: coronals · coronal · 0.87mm/px · 3 of 113 slices shown]
[im 38/113  soft-tissue]
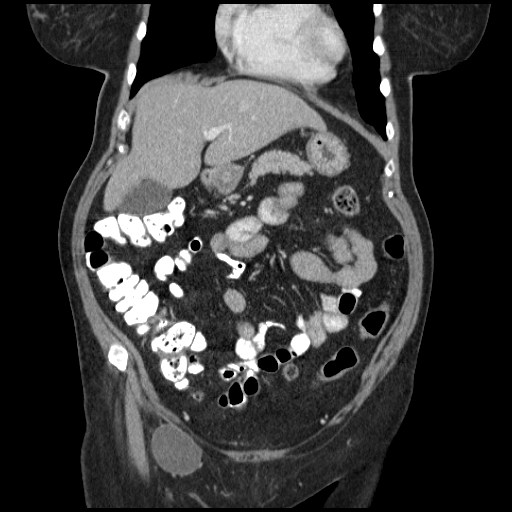
[im 50/113  soft-tissue]
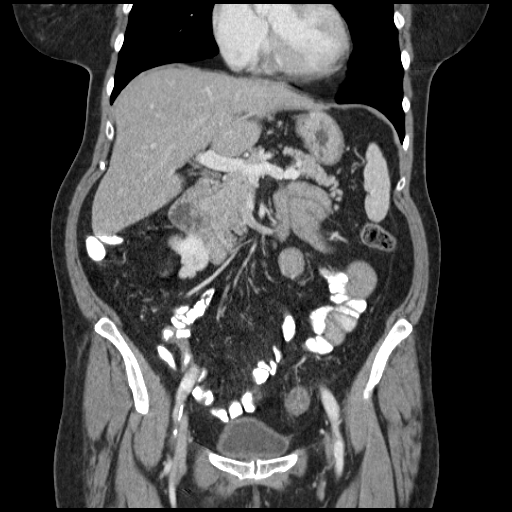
[im 63/113  soft-tissue]
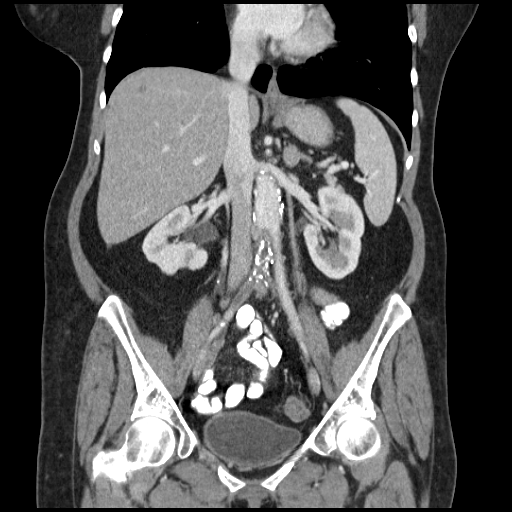

[17 of 46 positions shown; findings below may reference images not displayed]

FINDINGS: There is marked wall thickening of the sigmoid colon,
rectum, and upper anus.  Low density in the wall is compatible with
edema.  No pneumatosis.  There is stranding in the adjacent fat
compatible with inflammatory changes.  No extraluminal bowel gas.
No abscess.

Aorto bifemoral bypass graft is in place.  Graft and limbs are
widely patent.  Atherosclerotic changes of the aorta above the
proximal anastomosis are present.  SMA is patent.  Celiac is
patent.  Branch vessels of the IMA are grossly patent.

Thrombosed pseudoaneurysm in the right groin is stable.

Uterus is absent.  Adnexa are unremarkable.

Tiny hypodensity at the dome of the liver is stable.  Normal
appendix.  Gallbladder, spleen, pancreas, adrenal glands are within
normal limits.  Simple right renal cyst.  Lobulation of the kidneys
most consistent with scarring.
IMPRESSION: There is wall thickening and inflammatory change of the sigmoid
colon and rectum in a continuous fashion.  No evidence of abscess
or extraluminal bowel gas to suggest rupture.  Differential
diagnosis includes pseudomembranous colitis, ulcerative colitis,
diverticulitis.  Ischemic changes a consideration however branch
vessels of the IMA are grossly patent.  Occasionally, neoplasm can
have a similar appearance.  Correlate clinically as for the need
for follow-up imaging or colonoscopy.

## 2012-11-26 ENCOUNTER — Encounter: Payer: Self-pay | Admitting: Cardiovascular Disease

## 2013-02-09 LAB — HM DIABETES EYE EXAM

## 2013-02-11 ENCOUNTER — Encounter: Payer: Self-pay | Admitting: Internal Medicine

## 2013-04-05 ENCOUNTER — Ambulatory Visit (INDEPENDENT_AMBULATORY_CARE_PROVIDER_SITE_OTHER): Payer: BC Managed Care – PPO

## 2013-04-05 DIAGNOSIS — Z23 Encounter for immunization: Secondary | ICD-10-CM

## 2013-07-12 ENCOUNTER — Encounter: Payer: BC Managed Care – PPO | Admitting: Internal Medicine

## 2013-07-19 ENCOUNTER — Ambulatory Visit (INDEPENDENT_AMBULATORY_CARE_PROVIDER_SITE_OTHER): Payer: BC Managed Care – PPO | Admitting: Internal Medicine

## 2013-07-19 ENCOUNTER — Encounter: Payer: Self-pay | Admitting: Internal Medicine

## 2013-07-19 VITALS — BP 150/80 | HR 75 | Temp 97.8°F | Ht 63.5 in | Wt 145.0 lb

## 2013-07-19 DIAGNOSIS — F17200 Nicotine dependence, unspecified, uncomplicated: Secondary | ICD-10-CM

## 2013-07-19 DIAGNOSIS — Z0000 Encounter for general adult medical examination without abnormal findings: Secondary | ICD-10-CM

## 2013-07-19 DIAGNOSIS — E1159 Type 2 diabetes mellitus with other circulatory complications: Secondary | ICD-10-CM

## 2013-07-19 DIAGNOSIS — E1151 Type 2 diabetes mellitus with diabetic peripheral angiopathy without gangrene: Secondary | ICD-10-CM

## 2013-07-19 DIAGNOSIS — I10 Essential (primary) hypertension: Secondary | ICD-10-CM

## 2013-07-19 DIAGNOSIS — Z6379 Other stressful life events affecting family and household: Secondary | ICD-10-CM

## 2013-07-19 DIAGNOSIS — E785 Hyperlipidemia, unspecified: Secondary | ICD-10-CM

## 2013-07-19 DIAGNOSIS — I798 Other disorders of arteries, arterioles and capillaries in diseases classified elsewhere: Secondary | ICD-10-CM

## 2013-07-19 DIAGNOSIS — I2510 Atherosclerotic heart disease of native coronary artery without angina pectoris: Secondary | ICD-10-CM

## 2013-07-19 DIAGNOSIS — R011 Cardiac murmur, unspecified: Secondary | ICD-10-CM

## 2013-07-19 LAB — TSH: TSH: 3.25 u[IU]/mL (ref 0.35–5.50)

## 2013-07-19 LAB — MICROALBUMIN / CREATININE URINE RATIO
CREATININE, U: 84.5 mg/dL
MICROALB UR: 10.9 mg/dL — AB (ref 0.0–1.9)
MICROALB/CREAT RATIO: 12.9 mg/g (ref 0.0–30.0)

## 2013-07-19 LAB — LIPID PANEL
CHOLESTEROL: 293 mg/dL — AB (ref 0–200)
HDL: 29.8 mg/dL — ABNORMAL LOW (ref >39.00)
Total CHOL/HDL Ratio: 10
Triglycerides: 559 mg/dL — ABNORMAL HIGH (ref 0.0–149.0)
VLDL: 111.8 mg/dL — AB (ref 0.0–40.0)

## 2013-07-19 LAB — HEPATIC FUNCTION PANEL
ALBUMIN: 4.1 g/dL (ref 3.5–5.2)
ALT: 17 U/L (ref 0–35)
AST: 19 U/L (ref 0–37)
Alkaline Phosphatase: 110 U/L (ref 39–117)
Bilirubin, Direct: 0.1 mg/dL (ref 0.0–0.3)
TOTAL PROTEIN: 7.5 g/dL (ref 6.0–8.3)
Total Bilirubin: 0.7 mg/dL (ref 0.3–1.2)

## 2013-07-19 LAB — CBC WITH DIFFERENTIAL/PLATELET
Basophils Absolute: 0 10*3/uL (ref 0.0–0.1)
Basophils Relative: 0.4 % (ref 0.0–3.0)
EOS PCT: 2 % (ref 0.0–5.0)
Eosinophils Absolute: 0.2 10*3/uL (ref 0.0–0.7)
HEMATOCRIT: 51.1 % — AB (ref 36.0–46.0)
HEMOGLOBIN: 16.9 g/dL — AB (ref 12.0–15.0)
LYMPHS ABS: 1.7 10*3/uL (ref 0.7–4.0)
Lymphocytes Relative: 21.5 % (ref 12.0–46.0)
MCHC: 33 g/dL (ref 30.0–36.0)
MCV: 90.3 fl (ref 78.0–100.0)
MONO ABS: 0.5 10*3/uL (ref 0.1–1.0)
Monocytes Relative: 6.1 % (ref 3.0–12.0)
NEUTROS ABS: 5.6 10*3/uL (ref 1.4–7.7)
Neutrophils Relative %: 70 % (ref 43.0–77.0)
PLATELETS: 146 10*3/uL — AB (ref 150.0–400.0)
RBC: 5.66 Mil/uL — ABNORMAL HIGH (ref 3.87–5.11)
RDW: 14.4 % (ref 11.5–14.6)
WBC: 8.1 10*3/uL (ref 4.5–10.5)

## 2013-07-19 LAB — BASIC METABOLIC PANEL
BUN: 14 mg/dL (ref 6–23)
CHLORIDE: 101 meq/L (ref 96–112)
CO2: 24 mEq/L (ref 19–32)
Calcium: 10.5 mg/dL (ref 8.4–10.5)
Creatinine, Ser: 0.8 mg/dL (ref 0.4–1.2)
GFR: 79.65 mL/min (ref >60.00)
GLUCOSE: 312 mg/dL — AB (ref 70–99)
POTASSIUM: 5.9 meq/L — AB (ref 3.5–5.1)
Sodium: 136 mEq/L (ref 135–145)

## 2013-07-19 LAB — LDL CHOLESTEROL, DIRECT: LDL DIRECT: 150.3 mg/dL

## 2013-07-19 LAB — HEMOGLOBIN A1C: HEMOGLOBIN A1C: 11.6 % — AB (ref 4.6–6.5)

## 2013-07-19 MED ORDER — LISINOPRIL 20 MG PO TABS: 20.0000 mg | ORAL_TABLET | Freq: Every day | ORAL | Status: DC

## 2013-07-19 MED ORDER — ATORVASTATIN CALCIUM 80 MG PO TABS: 80.0000 mg | ORAL_TABLET | Freq: Every day | ORAL | Status: DC

## 2013-07-19 NOTE — Patient Instructions (Addendum)
Will notify you  of labs when available. Begin lisinopril for BP control as we discussed. Blood pressure is too low can cut the pill in half and take half a dose of 10 mg a day Restart a statin medication   Plan lipitor generic and will send into pharmacy. Can begin with half a pill a day and increase to whole.  ROV in about 3 months  Labs pre visit  Lipid and BMP  Advise no tobacco.

## 2013-07-19 NOTE — Progress Notes (Signed)
Chief Complaint  Patient presents with  . Annual Exam    Has been off her medication since last March.    . Hypertension  . Hyperlipidemia  . Coronary Artery Disease    HPI: Patient comes in today for Preventive Health Care visit  Last ov with me was 8 13 for colitis  She has not been back in a while because her husband diagnosed with stage IV liver cancer last January and was in and out of the hospital good bit he is here with her today for the visit. He is stable at this time. She's been off her medications except for aspirin and an occasional inhaler since that time. She has known cad sp cabg pvd s/p  fbypass ,controlled dm. States she feels fine denies claudication chest pain shortness of breath although she doesn't exercise regularly at this time. She still smoking about 9 a day. She does check her blood sugars and states that they're pretty good. She is up-to-date on her eye check Dr. Katy Fitch  this summer no active disease.  Health Maintenance  Topic Date Due  . Zostavax  12/21/2011  . Influenza Vaccine  01/14/2014  . Pap Smear  03/23/2014  . Colonoscopy  04/24/2015  . Tetanus/tdap  01/31/2019   Health Maintenance Review   ROS:  GEN/ HEENT: No fever, significant weight changes sweats headaches vision problems hearing changes, CV/ PULM; No chest pain shortness of breath cough, syncope,edema  change in exercise tolerance. GI /GU: No adominal pain, vomiting, change in bowel habits. No blood in the stool. No significant GU symptoms. SKIN/HEME: ,no acute skin rashes suspicious lesions or bleeding. No lymphadenopathy, nodules, masses.  NEURO/ PSYCH:  No neurologic signs such as weakness numbness. No depression anxiety. IMM/ Allergy: No unusual infections.  Allergy .   REST of 12 system review negative except as per HPI   Past Medical History  Diagnosis Date  . CAD (coronary artery disease)   . DM (diabetes mellitus)   . HTN (hypertension)   . Glaucoma   . PVD (peripheral  vascular disease)   . Osteoporosis   . COPD (chronic obstructive pulmonary disease)   . History of ventricular fibrillation   . Glaucoma   . Colitis   . Myocardial infarction     12/1998, 01/2011    Family History  Problem Relation Age of Onset  . Heart attack Father   . Diabetes Mother   . Hypertension    . Thyroid disease      sisters- sister had parathyroidectomy  . Alcohol abuse      brother  . Colon polyps Maternal Uncle   . Liver cancer Mother   . Breast cancer Sister     History   Social History  . Marital Status: Married    Spouse Name: N/A    Number of Children: 3  . Years of Education: N/A   Occupational History  . housewife    Social History Main Topics  . Smoking status: Current Every Day Smoker -- 0.40 packs/day    Types: Cigarettes  . Smokeless tobacco: Never Used  . Alcohol Use: No  . Drug Use: No  . Sexual Activity: None   Other Topics Concern  . None   Social History Narrative   Married   Regular exercise- yes not recently    [facility] of [facility] husband smokes outside    Quit tobacco in August at heart surgery. Restarted    Husband diagnosed with liver cancer since 2014  She helps care take.   No pets    Outpatient Encounter Prescriptions as of 07/19/2013  Medication Sig  . albuterol (PROVENTIL HFA;VENTOLIN HFA) 108 (90 BASE) MCG/ACT inhaler Inhale 2 puffs into the lungs every 6 (six) hours as needed. For shortness of breath or wheezing  . aspirin 325 MG EC tablet Take 325 mg by mouth every morning.   . cholecalciferol (VITAMIN D) 1000 UNITS tablet Take 1,000 Units by mouth every morning.  Marland Kitchen atorvastatin (LIPITOR) 80 MG tablet Take 1 tablet (80 mg total) by mouth daily.  Marland Kitchen lisinopril (PRINIVIL,ZESTRIL) 20 MG tablet Take 1 tablet (20 mg total) by mouth daily.  . [DISCONTINUED] CRESTOR 20 MG tablet TAKE 1 TABLET EVERY DAY  . [DISCONTINUED] Docusate Calcium (STOOL SOFTENER PO) Take 1 capsule by mouth daily.  . [DISCONTINUED] fenofibrate micronized  (LOFIBRA) 200 MG capsule Take 200 mg by mouth every evening.  . [DISCONTINUED] fenofibrate micronized (LOFIBRA) 200 MG capsule TAKE 1 CAPSULE BY MOUTH DAILY  . [DISCONTINUED] glimepiride (AMARYL) 2 MG tablet Take 1.5 tablets (3 mg total) by mouth daily before breakfast. Or as directed  . [DISCONTINUED] nitroGLYCERIN (NITROSTAT) 0.4 MG SL tablet Place 1 tablet (0.4 mg total) under the tongue every 5 (five) minutes as needed for chest pain.    EXAM:  BP 150/80  Pulse 75  Temp(Src) 97.8 F (36.6 C) (Oral)  Ht 5' 3.5" (1.613 m)  Wt 145 lb (65.772 kg)  BMI 25.28 kg/m2  SpO2 98%  Body mass index is 25.28 kg/(m^2).  Physical Exam: Vital signs reviewed [id number] is a well-developed well-nourished alert cooperative   female who appears her stated age in no acute distress.  HEENT: normocephalic atraumatic , Eyes: PERRL EOM's full, conjunctiva clear, Nares: paten,t no deformity discharge or tenderness., Ears: no deformity EAC's clear TMs with normal landmarks. Mouth: clear OP, no lesions, edema.  Moist mucous membranes. Dentition in adequate repair. NECK: supple without masses, thyromegaly or bruits. CHEST/PULM:  Clear to auscultation and percussion breath sounds equal no wheeze , rales or rhonchi. No chest Vandermeer deformities or tenderness. CV: PMI is nondisplaced, S1 S2 no gallops, , rubs there is a short 2/6 systolic ejection type murmur at the left upper sternal border PMI nondisplaced. Peripheral pulses are present without delay.No JVD . There is a slight bruit at her left femoral area  ABDOMEN: Bowel sounds normal nontender  No guard or rebound, no hepato splenomegal no CVA tenderness.  No hernia. No bruits heard Extremtities:  No clubbing cyanosis or edema, no acute joint swelling or redness no focal atrophy there is some decreased hair on her lower extremities but pulses are present NEURO:  Oriented x3, cranial nerves 3-12 appear to be intact, no obvious focal weakness,gait within normal limits  no abnormal reflexes or asymmetrical SKIN: No acute rashes normal turgor, color, no bruising or petechiae. PSYCH: Oriented, good eye contact, no obvious depression anxiety, cognition and judgment appear normal. LN: no cervical axillary inguinal adenopathy  ASSESSMENT AND PLAN:  Discussed the following assessment and plan:  Encounter for preventive health examination - . Delayed in followup Prevnar today declines mammogram - Plan: Basic metabolic panel, CBC with Differential, Hemoglobin A1c, Hepatic function panel, Lipid panel, TSH, Microalbumin / creatinine urine ratio  Diabetes mellitus with peripheral vascular disease - Very delayed in followup had been controlled 18 months ago. - Plan: Basic metabolic panel, CBC with Differential, Hemoglobin A1c, Hepatic function panel, Lipid panel, TSH, Microalbumin / creatinine urine ratio  HYPERTENSION - Off all medicines  restart ACE inhibitor then plan followup - Plan: Basic metabolic panel, CBC with Differential, Hemoglobin A1c, Hepatic function panel, Lipid panel, TSH, Microalbumin / creatinine urine ratio  HYPERLIPIDEMIA - Change to generic trial of Lipitor 74-25 mg uncertain why this was changed to Crestor in the past - Plan: Basic metabolic panel, CBC with Differential, Hemoglobin A1c, Hepatic function panel, Lipid panel, TSH, Microalbumin / creatinine urine ratio  CORONARY ARTERY DISEASE - No active symptoms today. - Plan: Basic metabolic panel, CBC with Differential, Hemoglobin A1c, Hepatic function panel, Lipid panel, TSH, Microalbumin / creatinine urine ratio  Tobacco use disorder - counseled   Problem with family member being ill - husband liver cancer   SYSTOLIC MURMUR Laboratory studies pending and plan followup in a few months. Or depending on lab results. She's been off all of her medication at least a year.          Patient Care Team: Burnis Medin, MD as PCP - General Thayer Headings, MD (Cardiology) Gaye Pollack, MD  (Cardiothoracic Surgery) Rosetta Posner, MD (Thoracic Diseases) Josue Hector, MD (Cardiology) Patient Instructions  Will notify you  of labs when available. Begin lisinopril for BP control as we discussed. Blood pressure is too low can cut the pill in half and take half a dose of 10 mg a day Restart a statin medication   Plan lipitor generic and will send into pharmacy. Can begin with half a pill a day and increase to whole.  ROV in about 3 months  Labs pre visit  Lipid and BMP  Advise no tobacco.   Standley Brooking. Caddie Randle M.D.  Pre visit review using our clinic review tool, if applicable. No additional management support is needed unless otherwise documented below in the visit note.

## 2013-07-20 ENCOUNTER — Telehealth: Payer: Self-pay | Admitting: Internal Medicine

## 2013-07-20 NOTE — Telephone Encounter (Signed)
Relevant patient education mailed to patient.  

## 2013-07-22 ENCOUNTER — Other Ambulatory Visit: Payer: Self-pay | Admitting: Family Medicine

## 2013-07-22 MED ORDER — GLIMEPIRIDE 2 MG PO TABS: 3.0000 mg | ORAL_TABLET | Freq: Every day | ORAL | Status: DC

## 2013-07-25 ENCOUNTER — Ambulatory Visit (INDEPENDENT_AMBULATORY_CARE_PROVIDER_SITE_OTHER): Payer: BC Managed Care – PPO | Admitting: Internal Medicine

## 2013-07-25 ENCOUNTER — Encounter: Payer: Self-pay | Admitting: Internal Medicine

## 2013-07-25 VITALS — BP 158/82 | HR 80 | Temp 97.9°F | Ht 63.5 in

## 2013-07-25 DIAGNOSIS — Z833 Family history of diabetes mellitus: Secondary | ICD-10-CM

## 2013-07-25 DIAGNOSIS — E875 Hyperkalemia: Secondary | ICD-10-CM

## 2013-07-25 DIAGNOSIS — E1159 Type 2 diabetes mellitus with other circulatory complications: Secondary | ICD-10-CM

## 2013-07-25 DIAGNOSIS — E119 Type 2 diabetes mellitus without complications: Secondary | ICD-10-CM

## 2013-07-25 LAB — BASIC METABOLIC PANEL
BUN: 14 mg/dL (ref 6–23)
CALCIUM: 10.2 mg/dL (ref 8.4–10.5)
CO2: 24 mEq/L (ref 19–32)
CREATININE: 0.7 mg/dL (ref 0.4–1.2)
Chloride: 103 mEq/L (ref 96–112)
GFR: 87.35 mL/min (ref >60.00)
Glucose, Bld: 228 mg/dL — ABNORMAL HIGH (ref 70–99)
Potassium: 5.7 mEq/L — ABNORMAL HIGH (ref 3.5–5.1)
SODIUM: 138 meq/L (ref 135–145)

## 2013-07-25 LAB — GLUCOSE, POCT (MANUAL RESULT ENTRY): POC GLUCOSE: 225 mg/dL — AB (ref 70–99)

## 2013-07-25 MED ORDER — INSULIN ASPART 100 UNIT/ML FLEXPEN: PEN_INJECTOR | SUBCUTANEOUS | Status: DC

## 2013-07-25 MED ORDER — INSULIN DETEMIR 100 UNIT/ML FLEXPEN: 14.0000 [IU] | PEN_INJECTOR | Freq: Every day | SUBCUTANEOUS | Status: DC

## 2013-07-25 NOTE — Progress Notes (Signed)
Pre visit review using our clinic review tool, if applicable. No additional management support is needed unless otherwise documented below in the visit note.   Chief Complaint  Patient presents with  . Follow-up    Labs.  . Diabetes    HPI: See phone note from last week. Patient states she feels fine has urinary frequency but no different than in the past. Denies dry mouth or poly-tipsy a. Here with her husband again today. Did blood sugars over the weekend. In the 2 to 300s although after having a biscuit it went up to 500. She did start on the medicine no side effects. 1 fluids this morning. Her morning sugar was in the 300s she is fasting except for her fluids ROS: See pertinent positives and negatives per HPI.  Past Medical History  Diagnosis Date  . CAD (coronary artery disease)   . DM (diabetes mellitus)   . HTN (hypertension)   . Glaucoma   . PVD (peripheral vascular disease)   . Osteoporosis   . COPD (chronic obstructive pulmonary disease)   . History of ventricular fibrillation   . Glaucoma   . Colitis   . Myocardial infarction     12/1998, 01/2011    Family History  Problem Relation Age of Onset  . Heart attack Father   . Diabetes Mother   . Hypertension    . Thyroid disease      sisters- sister had parathyroidectomy  . Alcohol abuse      brother  . Colon polyps Maternal Uncle   . Liver cancer Mother   . Breast cancer Sister     History   Social History  . Marital Status: Married    Spouse Name: N/A    Number of Children: 3  . Years of Education: N/A   Occupational History  . housewife    Social History Main Topics  . Smoking status: Current Every Day Smoker -- 0.40 packs/day    Types: Cigarettes  . Smokeless tobacco: Never Used  . Alcohol Use: No  . Drug Use: No  . Sexual Activity: None   Other Topics Concern  . None   Social History Narrative   Married   Regular exercise- yes not recently    [facility] of [facility] husband smokes outside    Quit  tobacco in August at heart surgery. Restarted    Husband diagnosed with liver cancer since 2014   She helps care take.   No pets    Outpatient Encounter Prescriptions as of 07/25/2013  Medication Sig  . albuterol (PROVENTIL HFA;VENTOLIN HFA) 108 (90 BASE) MCG/ACT inhaler Inhale 2 puffs into the lungs every 6 (six) hours as needed. For shortness of breath or wheezing  . aspirin 325 MG EC tablet Take 325 mg by mouth every morning.   Marland Kitchen atorvastatin (LIPITOR) 80 MG tablet Take 1 tablet (80 mg total) by mouth daily.  . cholecalciferol (VITAMIN D) 1000 UNITS tablet Take 1,000 Units by mouth every morning.  Marland Kitchen glimepiride (AMARYL) 2 MG tablet Take 1.5 tablets (3 mg total) by mouth daily before breakfast. Or as directed  . insulin aspart (NOVOLOG FLEXPEN) 100 UNIT/ML FlexPen As directed  prn  . Insulin Detemir (LEVEMIR FLEXTOUCH) 100 UNIT/ML Pen Inject 14 Units into the skin daily at 10 pm.  . lisinopril (PRINIVIL,ZESTRIL) 20 MG tablet Take 1 tablet (20 mg total) by mouth daily.    EXAM:  BP 158/82  Pulse 80  Temp(Src) 97.9 F (36.6 C) (Oral)  Ht 5'  3.5" (1.613 m)  SpO2 98%  There is no weight on file to calculate BMI.  GENERAL: vitals reviewed and listed above, alert, oriented, appears well hydrated and in no acute distress MS: moves all extremities without noticeable focal  abnormality PSYCH: pleasant and cooperative, no obvious depression or anxiety Lab Results  Component Value Date   WBC 8.1 07/19/2013   HGB 16.9* 07/19/2013   HCT 51.1* 07/19/2013   PLT 146.0* 07/19/2013   GLUCOSE 228* 07/25/2013   CHOL 293* 07/19/2013   TRIG 559.0 Triglyceride is over 400; calculations on Lipids are invalid.* 07/19/2013   HDL 29.80* 07/19/2013   LDLDIRECT 150.3 07/19/2013   LDLCALC 98 05/04/2012   ALT 17 07/19/2013   AST 19 07/19/2013   NA 138 07/25/2013   K 5.7* 07/25/2013   CL 103 07/25/2013   CREATININE 0.7 07/25/2013   BUN 14 07/25/2013   CO2 24 07/25/2013   TSH 3.25 07/19/2013   INR 1.06 02/01/2012   HGBA1C 11.6*  07/19/2013   MICROALBUR 10.9* 07/19/2013    ASSESSMENT AND PLAN:  Discussed the following assessment and plan:  Type II or unspecified type diabetes mellitus with peripheral circulatory disorders, uncontrolled(250.72) - Plan: Basic metabolic panel  Type II or unspecified type diabetes mellitus without mention of complication, not stated as uncontrolled - Plan: POC Glucose (CBG), Basic metabolic panel  Serum potassium elevated - Plan: Basic metabolic panel  Family history of diabetes mellitus - type 2 multile members Discussed with patient insulin use at this time we'll begin Levemir or given a prescription and a sample 14 units based on per kilogram body weight plan increase slowly checking blood sugars 3-4 times a day in the short run. Check a potassium level today before beginning the lisinopril. I suspect this could be a blood drawing affect and hydration affect. She has not been acidotic. Discussed the meaning of the A1c level up to 11.6 from total control 15 months ago. 14 units to be given in office today and then daily. Send in readings in 1 weeks and rov in 2 weeks or as needed  -Patient advised to return or notify health care team  if symptoms worsen or persist or new concerns arise.  Patient Instructions  Begin basal insulin which is a 24 hour insulin to help with blood sugar control Use  Inject 14 units   Once a day .  Avoid all sugars  Sweets    limit breast rice white potatoes biscuits etc .  I advise diabetes education  Referral  But can delay this i f you wish.   Check BG  3-4 x per day  Fasting  before or after  before dinner Bed time    We willing  increase the levemir depending on the am sugar levels.   May Increase 2 units   Every 3 days to get the fasting sugars under  140   If sugars are over 300  Or over  After on insulin and cutting out the simple sugars  you can take extra  6 units  Short acting insulin  Humalog or novolog.  251-300 = 6 units  301-350 =  8 units  351-400 = 10 units  Anything above 400   call the physician.  Call in or send in  Readings in a week . ROV in 2-3 weeks or as needed   Check potassium level today to decide on starting th lisinopril.     Standley Brooking. Panosh M.D.  Total visit 79mins >  50% spent counseling and coordinating care

## 2013-07-25 NOTE — Patient Instructions (Signed)
Begin basal insulin which is a 24 hour insulin to help with blood sugar control Use  Inject 14 units   Once a day .  Avoid all sugars  Sweets    limit breast rice white potatoes biscuits etc .  I advise diabetes education  Referral  But can delay this i f you wish.   Check BG  3-4 x per day  Fasting  before or after  before dinner Bed time    We willing  increase the levemir depending on the am sugar levels.   May Increase 2 units   Every 3 days to get the fasting sugars under  140   If sugars are over 300  Or over  After on insulin and cutting out the simple sugars  you can take extra  6 units  Short acting insulin  Humalog or novolog.  251-300 = 6 units  301-350 = 8 units  351-400 = 10 units  Anything above 400   call the physician.  Call in or send in  Readings in a week . ROV in 2-3 weeks or as needed   Check potassium level today to decide on starting th lisinopril.

## 2013-07-26 ENCOUNTER — Telehealth: Payer: Self-pay | Admitting: Internal Medicine

## 2013-07-26 NOTE — Telephone Encounter (Signed)
Relevant patient education mailed to patient.  

## 2013-08-02 ENCOUNTER — Ambulatory Visit: Payer: BC Managed Care – PPO | Admitting: Internal Medicine

## 2013-08-04 ENCOUNTER — Telehealth: Payer: Self-pay | Admitting: Family Medicine

## 2013-08-04 ENCOUNTER — Other Ambulatory Visit: Payer: Self-pay | Admitting: Family Medicine

## 2013-08-04 MED ORDER — GLUCOSE BLOOD VI STRP: ORAL_STRIP | Status: DC

## 2013-08-04 NOTE — Telephone Encounter (Signed)
Pt dropped off a paper with a listing of her recent blood sugar.  WP reviewed and decided the pt should increase her Levemir to 20 units.  Pt notified by telephone.  Instructed her to keep another record and send to [facility].  She had me call in a refill of her [employer]ayer Contour test strips.  Sent to [facility] on [address].

## 2013-08-05 ENCOUNTER — Telehealth: Payer: Self-pay | Admitting: Internal Medicine

## 2013-08-05 NOTE — Telephone Encounter (Signed)
Pt would like a new meter bayer contour next. Pt needs PA [phone] and than call [phone] for [employer] company to send her meter

## 2013-08-05 NOTE — Telephone Encounter (Signed)
Pt is coming to pick-up [employer] Next monitor.  It is in the cabinet.

## 2013-08-09 ENCOUNTER — Ambulatory Visit: Payer: BC Managed Care – PPO | Admitting: Internal Medicine

## 2013-08-12 ENCOUNTER — Ambulatory Visit: Payer: BC Managed Care – PPO | Admitting: Internal Medicine

## 2013-08-15 ENCOUNTER — Telehealth: Payer: Self-pay | Admitting: Internal Medicine

## 2013-08-15 MED ORDER — INSULIN PEN NEEDLE 32G X 4 MM MISC: Status: DC

## 2013-08-15 NOTE — Telephone Encounter (Signed)
Pt.notified

## 2013-08-15 NOTE — Telephone Encounter (Signed)
Pt states she is completely out of needles to administer her insulin.  Please send rx to [place]

## 2013-08-30 ENCOUNTER — Ambulatory Visit: Payer: BC Managed Care – PPO | Admitting: Internal Medicine

## 2013-09-19 ENCOUNTER — Other Ambulatory Visit: Payer: Self-pay | Admitting: Family Medicine

## 2013-09-20 ENCOUNTER — Other Ambulatory Visit: Payer: Self-pay | Admitting: Family Medicine

## 2013-09-20 DIAGNOSIS — E119 Type 2 diabetes mellitus without complications: Secondary | ICD-10-CM

## 2013-10-10 ENCOUNTER — Other Ambulatory Visit: Payer: BC Managed Care – PPO

## 2013-10-11 ENCOUNTER — Other Ambulatory Visit (INDEPENDENT_AMBULATORY_CARE_PROVIDER_SITE_OTHER): Payer: BC Managed Care – PPO

## 2013-10-11 DIAGNOSIS — E119 Type 2 diabetes mellitus without complications: Secondary | ICD-10-CM

## 2013-10-11 DIAGNOSIS — Z0000 Encounter for general adult medical examination without abnormal findings: Secondary | ICD-10-CM

## 2013-10-11 LAB — HEPATIC FUNCTION PANEL
ALBUMIN: 4 g/dL (ref 3.5–5.2)
ALT: 25 U/L (ref 0–35)
AST: 19 U/L (ref 0–37)
Alkaline Phosphatase: 84 U/L (ref 39–117)
BILIRUBIN TOTAL: 0.5 mg/dL (ref 0.3–1.2)
Bilirubin, Direct: 0.1 mg/dL (ref 0.0–0.3)
Total Protein: 7.3 g/dL (ref 6.0–8.3)

## 2013-10-11 LAB — BASIC METABOLIC PANEL
BUN: 12 mg/dL (ref 6–23)
CO2: 25 meq/L (ref 19–32)
Calcium: 10 mg/dL (ref 8.4–10.5)
Chloride: 102 mEq/L (ref 96–112)
Creatinine, Ser: 0.6 mg/dL (ref 0.4–1.2)
GFR: 103.73 mL/min (ref >60.00)
GLUCOSE: 168 mg/dL — AB (ref 70–99)
POTASSIUM: 4.4 meq/L (ref 3.5–5.1)
Sodium: 137 mEq/L (ref 135–145)

## 2013-10-11 LAB — CBC WITH DIFFERENTIAL/PLATELET
Basophils Absolute: 0 10*3/uL (ref 0.0–0.1)
Basophils Relative: 0.3 % (ref 0.0–3.0)
EOS PCT: 2.7 % (ref 0.0–5.0)
Eosinophils Absolute: 0.2 10*3/uL (ref 0.0–0.7)
HCT: 45.7 % (ref 36.0–46.0)
Hemoglobin: 15.4 g/dL — ABNORMAL HIGH (ref 12.0–15.0)
Lymphocytes Relative: 25.3 % (ref 12.0–46.0)
Lymphs Abs: 1.9 10*3/uL (ref 0.7–4.0)
MCHC: 33.8 g/dL (ref 30.0–36.0)
MCV: 89.9 fl (ref 78.0–100.0)
MONO ABS: 0.5 10*3/uL (ref 0.1–1.0)
MONOS PCT: 6.4 % (ref 3.0–12.0)
NEUTROS PCT: 65.3 % (ref 43.0–77.0)
Neutro Abs: 4.9 10*3/uL (ref 1.4–7.7)
Platelets: 165 10*3/uL (ref 150.0–400.0)
RBC: 5.08 Mil/uL (ref 3.87–5.11)
RDW: 15.1 % — ABNORMAL HIGH (ref 11.5–14.6)
WBC: 7.4 10*3/uL (ref 4.5–10.5)

## 2013-10-11 LAB — LIPID PANEL
CHOL/HDL RATIO: 6
Cholesterol: 180 mg/dL (ref 0–200)
HDL: 32.5 mg/dL — AB (ref >39.00)
LDL CALC: 109 mg/dL — AB (ref 0–99)
Triglycerides: 192 mg/dL — ABNORMAL HIGH (ref 0.0–149.0)
VLDL: 38.4 mg/dL (ref 0.0–40.0)

## 2013-10-11 LAB — MICROALBUMIN / CREATININE URINE RATIO
Creatinine,U: 50.9 mg/dL
Microalb Creat Ratio: 1 mg/g (ref 0.0–30.0)
Microalb, Ur: 0.5 mg/dL (ref 0.0–1.9)

## 2013-10-11 LAB — HEMOGLOBIN A1C: HEMOGLOBIN A1C: 7.6 % — AB (ref 4.6–6.5)

## 2013-10-11 LAB — TSH: TSH: 2.76 u[IU]/mL (ref 0.35–5.50)

## 2013-10-18 ENCOUNTER — Ambulatory Visit (INDEPENDENT_AMBULATORY_CARE_PROVIDER_SITE_OTHER): Payer: BC Managed Care – PPO | Admitting: Internal Medicine

## 2013-10-18 ENCOUNTER — Encounter: Payer: Self-pay | Admitting: Internal Medicine

## 2013-10-18 VITALS — BP 160/90 | HR 79 | Temp 98.1°F | Ht 63.5 in | Wt 149.0 lb

## 2013-10-18 DIAGNOSIS — I10 Essential (primary) hypertension: Secondary | ICD-10-CM

## 2013-10-18 DIAGNOSIS — E1151 Type 2 diabetes mellitus with diabetic peripheral angiopathy without gangrene: Secondary | ICD-10-CM

## 2013-10-18 DIAGNOSIS — I2510 Atherosclerotic heart disease of native coronary artery without angina pectoris: Secondary | ICD-10-CM

## 2013-10-18 DIAGNOSIS — I798 Other disorders of arteries, arterioles and capillaries in diseases classified elsewhere: Secondary | ICD-10-CM

## 2013-10-18 DIAGNOSIS — E1159 Type 2 diabetes mellitus with other circulatory complications: Secondary | ICD-10-CM

## 2013-10-18 DIAGNOSIS — Z634 Disappearance and death of family member: Secondary | ICD-10-CM | POA: Insufficient documentation

## 2013-10-18 DIAGNOSIS — E785 Hyperlipidemia, unspecified: Secondary | ICD-10-CM

## 2013-10-18 MED ORDER — LISINOPRIL-HYDROCHLOROTHIAZIDE 20-12.5 MG PO TABS: 1.0000 | ORAL_TABLET | Freq: Every day | ORAL | Status: DC

## 2013-10-18 NOTE — Progress Notes (Signed)
Chief Complaint  Patient presents with  . Follow-up    HPI: Kimberly [name]  comes in today for follow up of  multiple medical problems.  Fu gto delayed cause of mutliple weather events in feb march . Husband passed away a month ago.  Coping as best possible  Has family support. havent taken sugar readings since he went in hospital a month ago  Taking meds reg no syncope weigh tgain loss cp sob low BG feeling taking 20 of levemir  q d and lisinopril 20 per day  .Marland Kitchen  Avoiding sweets and simple carbs  LPIDS on meds  ROS: See pertinent positives and negatives per HPI. No cp sob   Past Medical History  Diagnosis Date  . CAD (coronary artery disease)   . DM (diabetes mellitus)   . HTN (hypertension)   . Glaucoma   . PVD (peripheral vascular disease)   . Osteoporosis   . COPD (chronic obstructive pulmonary disease)   . History of ventricular fibrillation   . Glaucoma   . Colitis   . Myocardial infarction     12/1998, 01/2011    Family History  Problem Relation Age of Onset  . Heart attack Father   . Diabetes Mother   . Hypertension    . Thyroid disease      sisters- sister had parathyroidectomy  . Alcohol abuse      brother  . Colon polyps Maternal Uncle   . Liver cancer Mother   . Breast cancer Sister     History   Social History  . Marital Status: Married    Spouse Name: N/A    Number of Children: 3  . Years of Education: N/A   Occupational History  . housewife    Social History Main Topics  . Smoking status: Current Every Day Smoker -- 0.40 packs/day    Types: Cigarettes  . Smokeless tobacco: Never Used  . Alcohol Use: No  . Drug Use: No  . Sexual Activity: None   Other Topics Concern  . None   Social History Narrative   Married just widowed    Regular exercise- yes not recently       Quit tobacco in August at heart surgery. Restarted    Husband diagnosed with liver cancer since 2014 passed April 2015    She was care taker.   No pets     Outpatient Encounter Prescriptions as of 10/18/2013  Medication Sig  . albuterol (PROVENTIL HFA;VENTOLIN HFA) 108 (90 BASE) MCG/ACT inhaler Inhale 2 puffs into the lungs every 6 (six) hours as needed. For shortness of breath or wheezing  . aspirin 325 MG EC tablet Take 325 mg by mouth every morning.   Marland Kitchen atorvastatin (LIPITOR) 80 MG tablet Take 1 tablet (80 mg total) by mouth daily.  . cholecalciferol (VITAMIN D) 1000 UNITS tablet Take 1,000 Units by mouth every morning.  Marland Kitchen glucose blood (BAYER CONTOUR TEST) test strip Use 3 to 4 times daily to test blood sugar  . Insulin Detemir (LEVEMIR) 100 UNIT/ML Pen Inject 20 Units into the skin daily at 10 pm.  . Insulin Pen Needle 32G X 4 MM MISC Use with Levemir Flex Pen  . [DISCONTINUED] lisinopril (PRINIVIL,ZESTRIL) 20 MG tablet Take 1 tablet (20 mg total) by mouth daily.  Marland Kitchen glimepiride (AMARYL) 2 MG tablet Take 1.5 tablets (3 mg total) by mouth daily before breakfast. Or as directed  . lisinopril-hydrochlorothiazide (ZESTORETIC) 20-12.5 MG per tablet Take 1 tablet by mouth  daily.  . [DISCONTINUED] insulin aspart (NOVOLOG FLEXPEN) 100 UNIT/ML FlexPen As directed  prn    EXAM:  BP 160/90  Pulse 79  Temp(Src) 98.1 F (36.7 C) (Oral)  Ht 5' 3.5" (1.613 m)  Wt 149 lb (67.586 kg)  BMI 25.98 kg/m2  SpO2 99%  Body mass index is 25.98 kg/(m^2).  GENERAL: vitals reviewed and listed above, alert, oriented, appears well hydrated and in no acute distress HEENT: atraumatic, conjunctiva  clear, no obvious abnormalities on inspection of external nose and ears  NECK: no obvious masses on inspection palpation  LUNGS: clear to auscultation bilaterally, no wheezes, rales or rhonchi,  CV: HRRR, no cyanosis or  peripheral edema nl cap refill  MS: moves all extremities without noticeable focal  abnormality PSYCH: pleasant and cooperative, no obvious depression or anxiety nl tearful when speaking of loss  Lab Results  Component Value Date   WBC 7.4  10/11/2013   HGB 15.4* 10/11/2013   HCT 45.7 10/11/2013   PLT 165.0 10/11/2013   GLUCOSE 168* 10/11/2013   CHOL 180 10/11/2013   TRIG 192.0* 10/11/2013   HDL 32.50* 10/11/2013   LDLDIRECT 150.3 07/19/2013   LDLCALC 109* 10/11/2013   ALT 25 10/11/2013   AST 19 10/11/2013   NA 137 10/11/2013   K 4.4 10/11/2013   CL 102 10/11/2013   CREATININE 0.6 10/11/2013   BUN 12 10/11/2013   CO2 25 10/11/2013   TSH 2.76 10/11/2013   INR 1.06 02/01/2012   HGBA1C 7.6* 10/11/2013   MICROALBUR 0.5 10/11/2013    ASSESSMENT AND PLAN:  Discussed the following assessment and plan:  Diabetes mellitus with peripheral vascular disease - better despite  spous and andal ill anness and death.  continue check when possible  Unspecified essential hypertension - not in a controlled ch to lis hctz  for now( past hx of elevated calcium noted after prescribed will check bmp in a month) if probleamtic could change to CCB   Coronary atherosclerosis of unspecified type of vessel, native or graft  HYPERLIPIDEMIA - improved on max lipitor    Death of family member  -Patient advised to return or notify health care team  if symptoms worsen ,persist or new concerns arise.  Patient Instructions  Diabetes is better   Still would  have you try to check sugar levels fasting  At least 3 x per week. Change blood pressure medication to same with low dose diuretic added . Check chemistry panel (BMP) in about 3-4 weeks after change to check potassium level.  return office visit in 3 months.  hgb a1c  Bmp pre visit    Standley Brooking. Evanthia Maund M.D. Pre visit review using our clinic review tool, if applicable. No additional management support is needed unless otherwise documented below in the visit note. Total visit 73mins > 50% spent counseling and coordinating care

## 2013-10-18 NOTE — Patient Instructions (Signed)
Diabetes is better   Still would  have you try to check sugar levels fasting  At least 3 x per week. Change blood pressure medication to same with low dose diuretic added . Check chemistry panel (BMP) in about 3-4 weeks after change to check potassium level.  return office visit in 3 months.  hgb a1c  Bmp pre visit

## 2013-10-19 ENCOUNTER — Telehealth: Payer: Self-pay | Admitting: Internal Medicine

## 2013-10-19 NOTE — Telephone Encounter (Signed)
Relevant patient education mailed to patient.  

## 2013-10-24 ENCOUNTER — Telehealth: Payer: Self-pay | Admitting: Internal Medicine

## 2013-10-24 MED ORDER — INSULIN DETEMIR 100 UNIT/ML FLEXPEN: 20.0000 [IU] | PEN_INJECTOR | Freq: Every day | SUBCUTANEOUS | Status: DC

## 2013-10-24 NOTE — Telephone Encounter (Signed)
Sent to the pharmacy by e-scribe. 

## 2013-10-24 NOTE — Telephone Encounter (Signed)
Re-fill needed on Insulin Detemir (LEVEMIR) 100 UNIT/ML Pen [facility] [address]

## 2013-10-26 ENCOUNTER — Telehealth: Payer: Self-pay

## 2013-10-26 NOTE — Telephone Encounter (Signed)
Relevant patient education mailed to patient.  

## 2013-11-18 ENCOUNTER — Other Ambulatory Visit (INDEPENDENT_AMBULATORY_CARE_PROVIDER_SITE_OTHER): Payer: BC Managed Care – PPO

## 2013-11-18 DIAGNOSIS — I10 Essential (primary) hypertension: Secondary | ICD-10-CM

## 2013-11-18 LAB — BASIC METABOLIC PANEL
BUN: 19 mg/dL (ref 6–23)
CO2: 28 meq/L (ref 19–32)
Calcium: 10.5 mg/dL (ref 8.4–10.5)
Chloride: 99 mEq/L (ref 96–112)
Creatinine, Ser: 0.8 mg/dL (ref 0.4–1.2)
GFR: 77.27 mL/min (ref >60.00)
GLUCOSE: 207 mg/dL — AB (ref 70–99)
POTASSIUM: 5 meq/L (ref 3.5–5.1)
Sodium: 135 mEq/L (ref 135–145)

## 2014-01-11 ENCOUNTER — Other Ambulatory Visit (INDEPENDENT_AMBULATORY_CARE_PROVIDER_SITE_OTHER): Payer: BC Managed Care – PPO

## 2014-01-11 DIAGNOSIS — IMO0001 Reserved for inherently not codable concepts without codable children: Secondary | ICD-10-CM

## 2014-01-11 DIAGNOSIS — I10 Essential (primary) hypertension: Secondary | ICD-10-CM

## 2014-01-11 DIAGNOSIS — E1165 Type 2 diabetes mellitus with hyperglycemia: Principal | ICD-10-CM

## 2014-01-11 LAB — BASIC METABOLIC PANEL
BUN: 12 mg/dL (ref 6–23)
CHLORIDE: 99 meq/L (ref 96–112)
CO2: 25 meq/L (ref 19–32)
CREATININE: 0.8 mg/dL (ref 0.4–1.2)
Calcium: 10 mg/dL (ref 8.4–10.5)
GFR: 78.36 mL/min (ref >60.00)
GLUCOSE: 279 mg/dL — AB (ref 70–99)
Potassium: 5 mEq/L (ref 3.5–5.1)
Sodium: 135 mEq/L (ref 135–145)

## 2014-01-11 LAB — HEMOGLOBIN A1C: Hgb A1c MFr Bld: 10.3 % — ABNORMAL HIGH (ref 4.6–6.5)

## 2014-01-18 ENCOUNTER — Ambulatory Visit (INDEPENDENT_AMBULATORY_CARE_PROVIDER_SITE_OTHER): Payer: BC Managed Care – PPO | Admitting: Internal Medicine

## 2014-01-18 ENCOUNTER — Encounter: Payer: Self-pay | Admitting: Internal Medicine

## 2014-01-18 VITALS — BP 137/80 | Temp 98.0°F | Ht 63.5 in | Wt 154.0 lb

## 2014-01-18 DIAGNOSIS — Z634 Disappearance and death of family member: Secondary | ICD-10-CM

## 2014-01-18 DIAGNOSIS — E1151 Type 2 diabetes mellitus with diabetic peripheral angiopathy without gangrene: Secondary | ICD-10-CM

## 2014-01-18 DIAGNOSIS — E1159 Type 2 diabetes mellitus with other circulatory complications: Secondary | ICD-10-CM

## 2014-01-18 DIAGNOSIS — I798 Other disorders of arteries, arterioles and capillaries in diseases classified elsewhere: Secondary | ICD-10-CM

## 2014-01-18 DIAGNOSIS — Z23 Encounter for immunization: Secondary | ICD-10-CM

## 2014-01-18 DIAGNOSIS — I10 Essential (primary) hypertension: Secondary | ICD-10-CM

## 2014-01-18 DIAGNOSIS — F17200 Nicotine dependence, unspecified, uncomplicated: Secondary | ICD-10-CM

## 2014-01-18 DIAGNOSIS — Z2911 Encounter for prophylactic immunotherapy for respiratory syncytial virus (RSV): Secondary | ICD-10-CM

## 2014-01-18 DIAGNOSIS — E119 Type 2 diabetes mellitus without complications: Secondary | ICD-10-CM

## 2014-01-18 DIAGNOSIS — Z789 Other specified health status: Secondary | ICD-10-CM

## 2014-01-18 MED ORDER — INSULIN ASPART 100 UNIT/ML FLEXPEN: 6.0000 [IU] | PEN_INJECTOR | SUBCUTANEOUS | Status: DC

## 2014-01-18 MED ORDER — INSULIN DETEMIR 100 UNIT/ML FLEXPEN: 24.0000 [IU] | PEN_INJECTOR | Freq: Every day | SUBCUTANEOUS | Status: DC

## 2014-01-18 MED ORDER — ALBUTEROL SULFATE HFA 108 (90 BASE) MCG/ACT IN AERS: 2.0000 | INHALATION_SPRAY | Freq: Four times a day (QID) | RESPIRATORY_TRACT | Status: DC | PRN

## 2014-01-18 NOTE — Progress Notes (Signed)
Pre visit review using our clinic review tool, if applicable. No additional management support is needed unless otherwise documented below in the visit note.   Chief Complaint  Patient presents with  . Follow-up    HPI: She is following up on her diabetes and blood pressure control. Since her last visit she really hasn't checked her readings very often has been dealing with her brisement and think she's doing as well as she can not as much exercise because of the weather another factors. Is not surprised if her sugars are up some. She is taking Levimir.  20   units per night Checking may be in am 2 x per week.   150- 160. Range fasting Pulled back getting out  Of shower for a few weeks. And then the heat  . Eating more when going out.  No new chest pain shortness of breath but would like her albuterol and K. she needs it. She has episodes of emotional crying and a family and friends are very supportive but doesn't feel she stuck and depression nor want to take medication at this time. If she can cut in exercise she feels better. ROS: See pertinent positives and negatives per HPI. No vision changes or neuropathy changes. Albuterol as needed   Past Medical History  Diagnosis Date  . CAD (coronary artery disease)   . DM (diabetes mellitus)   . HTN (hypertension)   . Glaucoma   . PVD (peripheral vascular disease)   . Osteoporosis   . COPD (chronic obstructive pulmonary disease)   . History of ventricular fibrillation   . Glaucoma   . Colitis   . Myocardial infarction     12/1998, 01/2011    Family History  Problem Relation Age of Onset  . Heart attack Father   . Diabetes Mother   . Hypertension    . Thyroid disease      sisters- sister had parathyroidectomy  . Alcohol abuse      brother  . Colon polyps Maternal Uncle   . Liver cancer Mother   . Breast cancer Sister     History   Social History  . Marital Status: Married    Spouse Name: N/A    Number of Children: 3  .  Years of Education: N/A   Occupational History  . housewife    Social History Main Topics  . Smoking status: Current Every Day Smoker -- 0.40 packs/day    Types: Cigarettes  . Smokeless tobacco: Never Used  . Alcohol Use: No  . Drug Use: No  . Sexual Activity: None   Other Topics Concern  . None   Social History Narrative   Married just widowed    Regular exercise- yes not recently       Quit tobacco in August at heart surgery. Restarted    Husband diagnosed with liver cancer since 2014 passed April 2015    She was care taker.   No pets    Outpatient Encounter Prescriptions as of 01/18/2014  Medication Sig  . albuterol (PROVENTIL HFA;VENTOLIN HFA) 108 (90 BASE) MCG/ACT inhaler Inhale 2 puffs into the lungs every 6 (six) hours as needed. For shortness of breath or wheezing  . aspirin 325 MG EC tablet Take 325 mg by mouth every morning.   Marland Kitchen atorvastatin (LIPITOR) 80 MG tablet Take 1 tablet (80 mg total) by mouth daily.  Marland Kitchen glucose blood (BAYER CONTOUR TEST) test strip Use 3 to 4 times daily to test blood sugar  .  Insulin Detemir (LEVEMIR) 100 UNIT/ML Pen Inject 24 Units into the skin daily at 10 pm.  . Insulin Pen Needle 32G X 4 MM MISC Use with Levemir Flex Pen  . lisinopril-hydrochlorothiazide (ZESTORETIC) 20-12.5 MG per tablet Take 1 tablet by mouth daily.  . [DISCONTINUED] albuterol (PROVENTIL HFA;VENTOLIN HFA) 108 (90 BASE) MCG/ACT inhaler Inhale 2 puffs into the lungs every 6 (six) hours as needed. For shortness of breath or wheezing  . [DISCONTINUED] glimepiride (AMARYL) 2 MG tablet Take 1.5 tablets (3 mg total) by mouth daily before breakfast. Or as directed  . [DISCONTINUED] Insulin Detemir (LEVEMIR) 100 UNIT/ML Pen Inject 20 Units into the skin daily at 10 pm.  . insulin aspart (NOVOLOG) 100 UNIT/ML FlexPen Inject 6 Units into the skin 1 day or 1 dose. With largest meal or as directed  . [DISCONTINUED] cholecalciferol (VITAMIN D) 1000 UNITS tablet Take 1,000 Units by mouth  every morning.    EXAM:  BP 137/80  Temp(Src) 98 F (36.7 C) (Oral)  Ht 5' 3.5" (1.613 m)  Wt 154 lb (69.854 kg)  BMI 26.85 kg/m2  Body mass index is 26.85 kg/(m^2).  GENERAL: vitals reviewed and listed above, alert, oriented, appears well hydrated and in no acute distress HEENT: atraumatic, conjunctiva  clear, no obvious abnormalities on inspection of external nose and ears MS: moves all extremities without noticeable focal  abnormality PSYCH: pleasant and cooperative, no obvious significant depression or anxiety appropriate affect Lab Results  Component Value Date   HGBA1C 10.3* 01/11/2014   HGBA1C 7.6* 10/11/2013   HGBA1C 11.6* 07/19/2013   Lab Results  Component Value Date   MICROALBUR 0.5 10/11/2013   LDLCALC 109* 10/11/2013   CREATININE 0.8 01/11/2014     Chemistry      Component Value Date/Time   NA 135 01/11/2014 0814   K 5.0 01/11/2014 0814   CL 99 01/11/2014 0814   CO2 25 01/11/2014 0814   BUN 12 01/11/2014 0814   CREATININE 0.8 01/11/2014 0814      Component Value Date/Time   CALCIUM 10.0 01/11/2014 0814   CALCIUM 10.5 06/19/2011 0831   ALKPHOS 84 10/11/2013 0853   AST 19 10/11/2013 0853   ALT 25 10/11/2013 0853   BILITOT 0.5 10/11/2013 0853       ASSESSMENT AND PLAN:  Discussed the following assessment and plan:  Diabetes mellitus with peripheral vascular disease - Worsening control since last visit to side effects of medicine needs to monitor blood sugars add NovoLog 6 units largest meal increase Levemir 24  Death of family member  Tobacco use disorder - continue lgiht smoker   Unspecified essential hypertension - usually normal at home  Recent bereavement  Need for shingles vaccine - Plan: Varicella-zoster vaccine subcutaneous  DIABETES MELLITUS, TYPE II Sending readings in about 2 weeks followup in about a month. Offered endocrinology appointment doesn't want to do that at this point prefer to work on things. -Patient advised to return or notify health  care team  if symptoms worsen ,persist or new concerns arise.  Patient Instructions   Diabetes is out of control again . Check blood sugars 2-3 times a day Morning,fasting  before lunch and or  before dinner   (can occasionally substitute 2 hours after eating to see how high peak)  Sending the readings 2 weeks from now.  increase  Levemir to 24 units a day.this is the 24 hour insulin Add 6 units NovoLog before your largest meal.  This insulin lasts about 2-6 hours a  short acting.  Plan followup visit in about a month to review all of this. Exercises possible if you're getting low blood sugars contact us for advice   Standley Brooking. Panosh M.D. Total visit 50mins > 50% spent counseling and coordinating care

## 2014-01-18 NOTE — Assessment & Plan Note (Signed)
Increased Levemir 24 add NovoLog 6 units largest meal check readings at least 2 perhaps 3 times a day followup 1 month sending readings 2 weeks. Increase exercise as tolerated.

## 2014-01-18 NOTE — Patient Instructions (Addendum)
  Diabetes is out of control again . Check blood sugars 2-3 times a day Morning,fasting  before lunch and or  before dinner   (can occasionally substitute 2 hours after eating to see how high peak)  Sending the readings 2 weeks from now.  increase  Levemir to 24 units a day.this is the 24 hour insulin Add 6 units NovoLog before your largest meal.  This insulin lasts about 2-6 hours a short acting.  Plan followup visit in about a month to review all of this. Exercises possible if you're getting low blood sugars contact us for advice

## 2014-02-09 ENCOUNTER — Encounter: Payer: Self-pay | Admitting: Internal Medicine

## 2014-02-09 NOTE — Progress Notes (Unsigned)
Pt dropped off BG readings  Tid  fbs down to 180 - 200 .  ,id day 200 range and pre dinner  190 - 220  Please  Have patient increase leveimir to 30  Units ( up 6 units from 24)  Continue checking BG as she is doing and record and bring to next visit in September .

## 2014-02-10 LAB — HM DIABETES EYE EXAM

## 2014-02-10 NOTE — Patient Instructions (Signed)
Pt notified to increase to 30 units of Levemir.  Will bring in readings in Sept.

## 2014-02-17 ENCOUNTER — Encounter: Payer: Self-pay | Admitting: Internal Medicine

## 2014-02-21 ENCOUNTER — Encounter: Payer: Self-pay | Admitting: Internal Medicine

## 2014-02-21 ENCOUNTER — Ambulatory Visit (INDEPENDENT_AMBULATORY_CARE_PROVIDER_SITE_OTHER): Payer: BC Managed Care – PPO | Admitting: Internal Medicine

## 2014-02-21 VITALS — BP 118/84 | Temp 98.0°F | Ht 63.5 in | Wt 152.1 lb

## 2014-02-21 DIAGNOSIS — E1159 Type 2 diabetes mellitus with other circulatory complications: Secondary | ICD-10-CM

## 2014-02-21 DIAGNOSIS — I10 Essential (primary) hypertension: Secondary | ICD-10-CM

## 2014-02-21 DIAGNOSIS — Z23 Encounter for immunization: Secondary | ICD-10-CM

## 2014-02-21 DIAGNOSIS — I2510 Atherosclerotic heart disease of native coronary artery without angina pectoris: Secondary | ICD-10-CM

## 2014-02-21 NOTE — Patient Instructions (Signed)
Your sugars  Ar much better   Trial increase the  Levemir.  To 32 units  To get the fasting BG to around 100- 120 range   ( other bgs should be below 180 preferably 140 range )   Labs   Hgba1c  Bmp  Pre visit   End of October or early November and then ROV .     Send  in sugars   In about a month  . Of if needed.

## 2014-02-21 NOTE — Progress Notes (Signed)
Pre visit review using our clinic review tool, if applicable. No additional management support is needed unless otherwise documented below in the visit note.  Chief Complaint  Patient presents with  . Follow-up    HPI: Kimberly [name] 63 y.o. fu of dm with vascular disease. Placed on insulin basal and meal time since alst visit she has sent in record of BG and increasing  Basal units   Feeling better    Now on 30 units  levemir takes hs  Stopped the novolug for now  Last 7 days  bg fasting high 146 los 118  Lunch an c pre dinner  130 - 160 range   No lows feels well. No cp sob resp sx New sx  Has noted that coffe plain and tomoatoes fresh shoot up sugar mostly  So cutting pout mostly.  ROS: See pertinent positives and negatives per HPI.no  Past Medical History  Diagnosis Date  . CAD (coronary artery disease)   . DM (diabetes mellitus)   . HTN (hypertension)   . Glaucoma   . PVD (peripheral vascular disease)   . Osteoporosis   . COPD (chronic obstructive pulmonary disease)   . History of ventricular fibrillation   . Glaucoma   . Colitis   . Myocardial infarction     12/1998, 01/2011  . Diverticulitis 02/10/2012    Family History  Problem Relation Age of Onset  . Heart attack Father   . Diabetes Mother   . Hypertension    . Thyroid disease      sisters- sister had parathyroidectomy  . Alcohol abuse      brother  . Colon polyps Maternal Uncle   . Liver cancer Mother   . Breast cancer Sister     History   Social History  . Marital Status: Married    Spouse Name: N/A    Number of Children: 3  . Years of Education: N/A   Occupational History  . housewife    Social History Main Topics  . Smoking status: Current Every Day Smoker -- 0.40 packs/day    Types: Cigarettes  . Smokeless tobacco: Never Used  . Alcohol Use: No  . Drug Use: No  . Sexual Activity: None   Other Topics Concern  . None   Social History Narrative   Married just widowed    Regular exercise-  yes not recently       Quit tobacco in August at heart surgery. Restarted    Husband diagnosed with liver cancer since 2014 passed April 2015    She was care taker.   No pets    Outpatient Encounter Prescriptions as of 02/21/2014  Medication Sig  . albuterol (PROVENTIL HFA;VENTOLIN HFA) 108 (90 BASE) MCG/ACT inhaler Inhale 2 puffs into the lungs every 6 (six) hours as needed. For shortness of breath or wheezing  . aspirin 325 MG EC tablet Take 325 mg by mouth every morning.   Marland Kitchen atorvastatin (LIPITOR) 80 MG tablet Take 1 tablet (80 mg total) by mouth daily.  Marland Kitchen glucose blood (BAYER CONTOUR TEST) test strip Use 3 to 4 times daily to test blood sugar  . Insulin Detemir (LEVEMIR) 100 UNIT/ML Pen Inject 30 Units into the skin daily at 10 pm.  . Insulin Pen Needle 32G X 4 MM MISC Use with Levemir Flex Pen  . lisinopril-hydrochlorothiazide (ZESTORETIC) 20-12.5 MG per tablet Take 1 tablet by mouth daily.  . insulin aspart (NOVOLOG) 100 UNIT/ML FlexPen Inject 6 Units into the skin  1 day or 1 dose. With largest meal or as directed    EXAM:  BP 118/84  Temp(Src) 98 F (36.7 C) (Oral)  Ht 5' 3.5" (1.613 m)  Wt 152 lb 1.6 oz (68.992 kg)  BMI 26.52 kg/m2  Body mass index is 26.52 kg/(m^2).  GENERAL: vitals reviewed and listed above, alert, oriented, appears well hydrated and in no acute distress HEENT: atraumatic, conjunctiva  clear, no obvious abnormalities on inspection of external nose and ears MS: moves all extremities without noticeable focal  abnormality PSYCH: pleasant and cooperative, no obvious depression or anxiety Lab Results  Component Value Date   WBC 7.4 10/11/2013   HGB 15.4* 10/11/2013   HCT 45.7 10/11/2013   PLT 165.0 10/11/2013   GLUCOSE 279* 01/11/2014   CHOL 180 10/11/2013   TRIG 192.0* 10/11/2013   HDL 32.50* 10/11/2013   LDLDIRECT 150.3 07/19/2013   LDLCALC 109* 10/11/2013   ALT 25 10/11/2013   AST 19 10/11/2013   NA 135 01/11/2014   K 5.0 01/11/2014   CL 99 01/11/2014    CREATININE 0.8 01/11/2014   BUN 12 01/11/2014   CO2 25 01/11/2014   TSH 2.76 10/11/2013   INR 1.06 02/01/2012   HGBA1C 10.3* 01/11/2014   MICROALBUR 0.5 10/11/2013   revewied bg log given per patient ASSESSMENT AND PLAN:  Discussed the following assessment and plan:  Type II or unspecified type diabetes mellitus with peripheral circulatory disorders, uncontrolled(250.72)  CORONARY ARTERY DISEASE  HYPERTENSION  DIABETES MELLITUS, TYPE II  Need for prophylactic vaccination and inoculation against influenza - Plan: Flu Vaccine QUAD 36+ mos PF IM (Fluarix Quad PF)  -Patient advised to return or notify health care team  if symptoms worsen ,persist or new concerns arise.  There are no Patient Instructions on file for this visit.   Standley Brooking. Panosh M.D.

## 2014-03-20 ENCOUNTER — Telehealth: Payer: Self-pay | Admitting: Internal Medicine

## 2014-03-20 MED ORDER — INSULIN DETEMIR 100 UNIT/ML FLEXPEN: 32.0000 [IU] | PEN_INJECTOR | Freq: Every day | SUBCUTANEOUS | Status: DC

## 2014-03-20 NOTE — Telephone Encounter (Signed)
Pt request refill of the following: Insulin Detemir (LEVEMIR) 100 UNIT/ML Pen   Pt said insurance want pay cause she need a new rx since her levemir dosage was increased      Phamacy:  [facility] [place]

## 2014-03-20 NOTE — Telephone Encounter (Signed)
Sent to the pharmacy by e-scribe. 

## 2014-04-07 ENCOUNTER — Other Ambulatory Visit (INDEPENDENT_AMBULATORY_CARE_PROVIDER_SITE_OTHER): Payer: BC Managed Care – PPO

## 2014-04-07 DIAGNOSIS — E119 Type 2 diabetes mellitus without complications: Secondary | ICD-10-CM

## 2014-04-07 DIAGNOSIS — I10 Essential (primary) hypertension: Secondary | ICD-10-CM

## 2014-04-07 LAB — BASIC METABOLIC PANEL
BUN: 21 mg/dL (ref 6–23)
CO2: 22 meq/L (ref 19–32)
Calcium: 10.3 mg/dL (ref 8.4–10.5)
Chloride: 101 mEq/L (ref 96–112)
Creatinine, Ser: 0.8 mg/dL (ref 0.4–1.2)
GFR: 80.65 mL/min (ref >60.00)
GLUCOSE: 163 mg/dL — AB (ref 70–99)
Potassium: 5.1 mEq/L (ref 3.5–5.1)
SODIUM: 136 meq/L (ref 135–145)

## 2014-04-07 LAB — HEMOGLOBIN A1C: Hgb A1c MFr Bld: 8.1 % — ABNORMAL HIGH (ref 4.6–6.5)

## 2014-04-13 ENCOUNTER — Encounter: Payer: Self-pay | Admitting: Internal Medicine

## 2014-04-13 ENCOUNTER — Ambulatory Visit (INDEPENDENT_AMBULATORY_CARE_PROVIDER_SITE_OTHER): Payer: BC Managed Care – PPO | Admitting: Internal Medicine

## 2014-04-13 VITALS — BP 136/80 | Temp 98.3°F | Wt 150.0 lb

## 2014-04-13 DIAGNOSIS — E1151 Type 2 diabetes mellitus with diabetic peripheral angiopathy without gangrene: Secondary | ICD-10-CM

## 2014-04-13 DIAGNOSIS — I10 Essential (primary) hypertension: Secondary | ICD-10-CM

## 2014-04-13 DIAGNOSIS — E785 Hyperlipidemia, unspecified: Secondary | ICD-10-CM

## 2014-04-13 DIAGNOSIS — E1169 Type 2 diabetes mellitus with other specified complication: Secondary | ICD-10-CM

## 2014-04-13 NOTE — Patient Instructions (Signed)
Keep up the good work   Goal fasting below 120 Goal 2 hours after eating  Below 180  Acceptable but best 140- 150 and below .  Plan ROV  And lab pre visit  a1c lipid

## 2014-04-13 NOTE — Progress Notes (Signed)
Pre visit review using our clinic review tool, if applicable. No additional management support is needed unless otherwise documented below in the visit note.  Chief Complaint  Patient presents with  . Follow-up    HPI: Kimberly [name]  Come for fu DM pvd ets Hypertension : controlled  No change in meds  Diabetes Mellitus: has logged sugars and much improved sent in   No low vision change on 32 levemir fastin all below 126 in past few weeks .once down to 102.   PP max 160 range  Dyslipidemia no change meds  Max dose  Tobacco about the samde  Sits with elederly couples  Weekend walking  No new cv pvd sx  ROS: See pertinent positives and negatives per HPI.  Past Medical History  Diagnosis Date  . CAD (coronary artery disease)   . DM (diabetes mellitus)   . HTN (hypertension)   . Glaucoma   . PVD (peripheral vascular disease)   . Osteoporosis   . COPD (chronic obstructive pulmonary disease)   . History of ventricular fibrillation   . Glaucoma   . Colitis   . Myocardial infarction     12/1998, 01/2011  . Diverticulitis 02/10/2012  . Colitis, indeterminate 01/31/2012    Family History  Problem Relation Age of Onset  . Heart attack Father   . Diabetes Mother   . Hypertension    . Thyroid disease      sisters- sister had parathyroidectomy  . Alcohol abuse      brother  . Colon polyps Maternal Uncle   . Liver cancer Mother   . Breast cancer Sister     History   Social History  . Marital Status: Married    Spouse Name: N/A    Number of Children: 3  . Years of Education: N/A   Occupational History  . housewife    Social History Main Topics  . Smoking status: Current Every Day Smoker -- 0.40 packs/day    Types: Cigarettes  . Smokeless tobacco: Never Used  . Alcohol Use: No  . Drug Use: No  . Sexual Activity: None   Other Topics Concern  . None   Social History Narrative   Married just widowed    Regular exercise- yes not recently       Quit tobacco in August  at heart surgery. Restarted    Husband diagnosed with liver cancer since 2014 passed April 2015    She was care taker.   No pets    Outpatient Encounter Prescriptions as of 04/13/2014  Medication Sig  . albuterol (PROVENTIL HFA;VENTOLIN HFA) 108 (90 BASE) MCG/ACT inhaler Inhale 2 puffs into the lungs every 6 (six) hours as needed. For shortness of breath or wheezing  . aspirin 325 MG EC tablet Take 325 mg by mouth every morning.   Marland Kitchen atorvastatin (LIPITOR) 80 MG tablet Take 1 tablet (80 mg total) by mouth daily.  Marland Kitchen glucose blood (BAYER CONTOUR TEST) test strip Use 3 to 4 times daily to test blood sugar  . insulin aspart (NOVOLOG) 100 UNIT/ML FlexPen Inject 6 Units into the skin 1 day or 1 dose. With largest meal or as directed  . Insulin Detemir (LEVEMIR) 100 UNIT/ML Pen Inject 32 Units into the skin daily at 10 pm.  . Insulin Pen Needle 32G X 4 MM MISC Use with Levemir Flex Pen  . lisinopril-hydrochlorothiazide (ZESTORETIC) 20-12.5 MG per tablet Take 1 tablet by mouth daily.    EXAM:  BP 136/80  Temp(Src) 98.3 F (36.8 C) (Oral)  Wt 150 lb (68.04 kg)  Body mass index is 26.15 kg/(m^2).  GENERAL: vitals reviewed and listed above, alert, oriented, appears well hydrated and in no acute distress HEENT: atraumatic, conjunctiva  clear, no obvious abnormalities on inspection of external nose and ears  NECK: no obvious masses on inspection palpation  LUNGS: clear to auscultation bilaterally, no wheezes, rales or rhonchi,  CV: HRRR, no clubbing cyanosis or  peripheral edema nl cap refill  1/2 short sem lusb MS: moves all extremities without noticeable focal  abnormality PSYCH: pleasant and cooperative, no obvious depression or anxiety Lab Results  Component Value Date   WBC 7.4 10/11/2013   HGB 15.4* 10/11/2013   HCT 45.7 10/11/2013   PLT 165.0 10/11/2013   GLUCOSE 163* 04/07/2014   CHOL 180 10/11/2013   TRIG 192.0* 10/11/2013   HDL 32.50* 10/11/2013   LDLDIRECT 150.3 07/19/2013   LDLCALC  109* 10/11/2013   ALT 25 10/11/2013   AST 19 10/11/2013   NA 136 04/07/2014   K 5.1 04/07/2014   CL 101 04/07/2014   CREATININE 0.8 04/07/2014   BUN 21 04/07/2014   CO2 22 04/07/2014   TSH 2.76 10/11/2013   INR 1.06 02/01/2012   HGBA1C 8.1* 04/07/2014   MICROALBUR 0.5 10/11/2013   Lab Results  Component Value Date   HGBA1C 8.1* 04/07/2014   HGBA1C 10.3* 01/11/2014   HGBA1C 7.6* 10/11/2013   Lab Results  Component Value Date   MICROALBUR 0.5 10/11/2013   LDLCALC 109* 10/11/2013   CREATININE 0.8 04/07/2014   Wt Readings from Last 3 Encounters:  04/13/14 150 lb (68.04 kg)  02/21/14 152 lb 1.6 oz (68.992 kg)  01/18/14 154 lb (69.854 kg)    bg log reviewed  ASSESSMENT AND PLAN:  Discussed the following assessment and plan:  Diabetes mellitus with peripheral vascular disease  Hyperlipidemia associated with type 2 diabetes mellitus  Essential hypertension contniue  OV 3 months  -Patient advised to return or notify health care team  if symptoms worsen ,persist or new concerns arise.  Patient Instructions  Keep up the good work   Goal fasting below 120 Goal 2 hours after eating  Below 180  Acceptable but best 140- 150 and below .  Plan ROV  And lab pre visit  a1c lipid       Mariann Laster K. Johnnisha Forton M.D.

## 2014-04-13 NOTE — Assessment & Plan Note (Signed)
Much inmproved and expect to continue as such on 32 levemir . Fu in 3-4 months can inc levemoir 2 u if fastin above 120.

## 2014-05-15 ENCOUNTER — Other Ambulatory Visit: Payer: Self-pay | Admitting: Internal Medicine

## 2014-05-15 NOTE — Telephone Encounter (Signed)
Sent to the pharmacy by e-scribe. 

## 2014-07-04 ENCOUNTER — Other Ambulatory Visit: Payer: Self-pay | Admitting: Internal Medicine

## 2014-07-05 NOTE — Telephone Encounter (Signed)
Sent to the pharmacy by e-scribe. 

## 2014-07-12 ENCOUNTER — Other Ambulatory Visit: Payer: Self-pay | Admitting: Internal Medicine

## 2014-07-12 NOTE — Telephone Encounter (Signed)
Sent to the pharmacy by e-scribe. 

## 2014-07-17 ENCOUNTER — Other Ambulatory Visit: Payer: Self-pay | Admitting: Internal Medicine

## 2014-07-17 NOTE — Telephone Encounter (Signed)
Inform patient  Change to lantus  Per her insurance company plan Monitor  Same unit but monitor to make sure no low BGs and still in  Control

## 2014-07-17 NOTE — Telephone Encounter (Signed)
PA for Levemir was denied.  Patient must try and fail Lantus.

## 2014-07-18 MED ORDER — INSULIN GLARGINE 100 UNIT/ML ~~LOC~~ SOLN: 32.0000 [IU] | Freq: Every day | SUBCUTANEOUS | Status: DC

## 2014-07-18 NOTE — Telephone Encounter (Signed)
Lantus sent to the pharmacy.  LMOM for the pt to return my call.

## 2014-07-19 NOTE — Telephone Encounter (Signed)
Pt notified that new rx sent to the pharmacy and to continue to monitor bg.

## 2014-08-01 ENCOUNTER — Other Ambulatory Visit: Payer: Self-pay

## 2014-08-04 ENCOUNTER — Other Ambulatory Visit (INDEPENDENT_AMBULATORY_CARE_PROVIDER_SITE_OTHER): Payer: BLUE CROSS/BLUE SHIELD

## 2014-08-04 DIAGNOSIS — E1151 Type 2 diabetes mellitus with diabetic peripheral angiopathy without gangrene: Secondary | ICD-10-CM

## 2014-08-04 DIAGNOSIS — IMO0002 Reserved for concepts with insufficient information to code with codable children: Secondary | ICD-10-CM

## 2014-08-04 DIAGNOSIS — E785 Hyperlipidemia, unspecified: Secondary | ICD-10-CM

## 2014-08-04 DIAGNOSIS — E1159 Type 2 diabetes mellitus with other circulatory complications: Secondary | ICD-10-CM

## 2014-08-04 DIAGNOSIS — E1165 Type 2 diabetes mellitus with hyperglycemia: Principal | ICD-10-CM

## 2014-08-04 LAB — HEMOGLOBIN A1C: Hgb A1c MFr Bld: 8.7 % — ABNORMAL HIGH (ref 4.6–6.5)

## 2014-08-04 LAB — LIPID PANEL
CHOL/HDL RATIO: 6
Cholesterol: 145 mg/dL (ref 0–200)
HDL: 26.2 mg/dL — ABNORMAL LOW (ref >39.00)
LDL CALC: 83 mg/dL (ref 0–99)
NonHDL: 118.8
TRIGLYCERIDES: 181 mg/dL — AB (ref 0.0–149.0)
VLDL: 36.2 mg/dL (ref 0.0–40.0)

## 2014-08-07 ENCOUNTER — Encounter: Payer: Self-pay | Admitting: Internal Medicine

## 2014-08-07 ENCOUNTER — Ambulatory Visit (INDEPENDENT_AMBULATORY_CARE_PROVIDER_SITE_OTHER): Payer: BLUE CROSS/BLUE SHIELD | Admitting: Internal Medicine

## 2014-08-07 VITALS — BP 140/76 | Temp 98.1°F | Ht 63.5 in | Wt 156.0 lb

## 2014-08-07 DIAGNOSIS — E1169 Type 2 diabetes mellitus with other specified complication: Secondary | ICD-10-CM

## 2014-08-07 DIAGNOSIS — E1151 Type 2 diabetes mellitus with diabetic peripheral angiopathy without gangrene: Secondary | ICD-10-CM

## 2014-08-07 DIAGNOSIS — Z720 Tobacco use: Secondary | ICD-10-CM

## 2014-08-07 DIAGNOSIS — F17200 Nicotine dependence, unspecified, uncomplicated: Secondary | ICD-10-CM

## 2014-08-07 DIAGNOSIS — E785 Hyperlipidemia, unspecified: Secondary | ICD-10-CM

## 2014-08-07 DIAGNOSIS — I10 Essential (primary) hypertension: Secondary | ICD-10-CM

## 2014-08-07 NOTE — Progress Notes (Signed)
Pre visit review using our clinic review tool, if applicable. No additional management support is needed unless otherwise documented below in the visit note.  Chief Complaint  Patient presents with  . Follow-up  . Diabetes  . Hyperlipidemia    HPI: Kimberly [name]  comes in today for follow up of  multiple medical problems.  Cad dm DM: taking  35 uniits levemir( changed from lantus cause of insurance )  Not taking 6 units novalopg except  At holiday indiscretion Has noted am bg in the 200s and rest of other times checked in mid high 100s   Not scanking at night and  Giving insulin about 9 pm .      BP  Ok  Tobacco down to 4 per day after meals  And am  LIPIDS no se of med reported. VD no caludicaiton not  As active in am sitting  But tries to be mobile  Feels well  Otherwise  No lows  ROS: See pertinent positives and negatives per HPI.  Past Medical History  Diagnosis Date  . CAD (coronary artery disease)   . DM (diabetes mellitus)   . HTN (hypertension)   . Glaucoma   . PVD (peripheral vascular disease)   . Osteoporosis   . COPD (chronic obstructive pulmonary disease)   . History of ventricular fibrillation   . Glaucoma   . Colitis   . Myocardial infarction     12/1998, 01/2011  . Diverticulitis 02/10/2012  . Colitis, indeterminate 01/31/2012    Family History  Problem Relation Age of Onset  . Heart attack Father   . Diabetes Mother   . Hypertension    . Thyroid disease      sisters- sister had parathyroidectomy  . Alcohol abuse      brother  . Colon polyps Maternal Uncle   . Liver cancer Mother   . Breast cancer Sister     History   Social History  . Marital Status: Married    Spouse Name: N/A  . Number of Children: 3  . Years of Education: N/A   Occupational History  . housewife    Social History Main Topics  . Smoking status: Current Every Day Smoker -- 0.40 packs/day    Types: Cigarettes  . Smokeless tobacco: Never Used  . Alcohol Use: No  . Drug  Use: No  . Sexual Activity: Not on file   Other Topics Concern  . None   Social History Narrative   Married just widowed    Regular exercise- yes not recently       Quit tobacco in August at heart surgery. Restarted    Husband diagnosed with liver cancer since 2014 passed April 2015    She was care taker.   No pets    Outpatient Encounter Prescriptions as of 08/07/2014  Medication Sig  . aspirin 325 MG EC tablet Take 325 mg by mouth every morning.   Marland Kitchen atorvastatin (LIPITOR) 80 MG tablet TAKE 1 TABLET (80 MG TOTAL) BY MOUTH DAILY.  . BD PEN NEEDLE NANO U/F 32G X 4 MM MISC USE WITH LEVEMIR FLEX PEN  . glucose blood (BAYER CONTOUR TEST) test strip Use 3 to 4 times daily to test blood sugar  . insulin aspart (NOVOLOG) 100 UNIT/ML FlexPen Inject 6 Units into the skin 1 day or 1 dose. With largest meal or as directed  . Insulin Detemir (LEVEMIR) 100 UNIT/ML Pen Inject 40 Units into the skin at bedtime.  Marland Kitchen lisinopril-hydrochlorothiazide (  ZESTORETIC) 20-12.5 MG per tablet Take 1 tablet by mouth daily.  . [DISCONTINUED] insulin glargine (LANTUS) 100 UNIT/ML injection Inject 0.32 mLs (32 Units total) into the skin at bedtime. (Patient taking differently: Inject 35 Units into the skin at bedtime. )  . albuterol (PROVENTIL HFA;VENTOLIN HFA) 108 (90 BASE) MCG/ACT inhaler Inhale 2 puffs into the lungs every 6 (six) hours as needed. For shortness of breath or wheezing (Patient not taking: Reported on 08/07/2014)    EXAM:  BP 140/76 mmHg  Temp(Src) 98.1 F (36.7 C) (Oral)  Ht 5' 3.5" (1.613 m)  Wt 156 lb (70.761 kg)  BMI 27.20 kg/m2  Body mass index is 27.2 kg/(m^2).  GENERAL: vitals reviewed and listed above, alert, oriented, appears well hydrated and in no acute distress MS: moves all extremities without noticeable focal  abnormality PSYCH: pleasant and cooperative, no obvious depression or anxiety Lab Results  Component Value Date   WBC 7.4 10/11/2013   HGB 15.4* 10/11/2013   HCT 45.7  10/11/2013   PLT 165.0 10/11/2013   GLUCOSE 163* 04/07/2014   CHOL 145 08/04/2014   TRIG 181.0* 08/04/2014   HDL 26.20* 08/04/2014   LDLDIRECT 150.3 07/19/2013   LDLCALC 83 08/04/2014   ALT 25 10/11/2013   AST 19 10/11/2013   NA 136 04/07/2014   K 5.1 04/07/2014   CL 101 04/07/2014   CREATININE 0.8 04/07/2014   BUN 21 04/07/2014   CO2 22 04/07/2014   TSH 2.76 10/11/2013   INR 1.06 02/01/2012   HGBA1C 8.7* 08/04/2014   MICROALBUR 0.5 10/11/2013  reveiwed labs and plan   ASSESSMENT AND PLAN:  Discussed the following assessment and plan:  Diabetes mellitus with peripheral vascular disease - sub optimal control  had improved but back up some   Essential hypertension  Hyperlipidemia associated with type 2 diabetes mellitus  Tobacco use disorder - counseled  ROV in about 6 weeks  Readings before them ( see instructions)  Can do endo referral if needed  Med list said latus but is taking levemir.  Also not taking the mealtimes insulin on a reg basis  Total visit 12mins > 50% spent counseling and coordinating care  About disease management and  Insulin plan  Tobacco dc etc  -Patient advised to return or notify health care team  if symptoms worsen ,persist or new concerns arise.  Patient Instructions   Increase  The levmir   To 40 units.    Take 6 units of novolog   Before lunch ( unless not eating lunch)  After 7- 10 days  If  Fasting blood sugar is  Over 120 increase levemir  To 45  units.  If still not getting fasting in control contact our office for further .   Get rid of the tobacco you are so close .   Goal fasting below 120 Goal 2 hours after eating  Below 180  Acceptable but best 140- 150 and below  ROV  In about 6 weeks .  Lab Results  Component Value Date   WBC 7.4 10/11/2013   HGB 15.4* 10/11/2013   HCT 45.7 10/11/2013   PLT 165.0 10/11/2013   GLUCOSE 163* 04/07/2014   CHOL 145 08/04/2014   TRIG 181.0* 08/04/2014   HDL 26.20* 08/04/2014   LDLDIRECT  150.3 07/19/2013   LDLCALC 83 08/04/2014   ALT 25 10/11/2013   AST 19 10/11/2013   NA 136 04/07/2014   K 5.1 04/07/2014   CL 101 04/07/2014   CREATININE 0.8 04/07/2014  BUN 21 04/07/2014   CO2 22 04/07/2014   TSH 2.76 10/11/2013   INR 1.06 02/01/2012   HGBA1C 8.7* 08/04/2014   MICROALBUR 0.5 10/11/2013        Standley Brooking. Colston Pyle M.D.

## 2014-08-07 NOTE — Addendum Note (Signed)
Addended by: Miles Costain T on: 08/07/2014 03:52 PM   Modules accepted: Orders, Medications

## 2014-08-07 NOTE — Telephone Encounter (Signed)
I received another PA request for Levemir.  Did the patient try and fail Lantus?

## 2014-08-07 NOTE — Patient Instructions (Addendum)
Increase  The levmir   To 40 units.    Take 6 units of novolog   Before lunch ( unless not eating lunch)  After 7- 10 days  If  Fasting blood sugar is  Over 120 increase levemir  To 45  units.  If still not getting fasting in control contact our office for further .   Get rid of the tobacco you are so close .   Goal fasting below 120 Goal 2 hours after eating  Below 180  Acceptable but best 140- 150 and below  ROV  In about 6 weeks .  Lab Results  Component Value Date   WBC 7.4 10/11/2013   HGB 15.4* 10/11/2013   HCT 45.7 10/11/2013   PLT 165.0 10/11/2013   GLUCOSE 163* 04/07/2014   CHOL 145 08/04/2014   TRIG 181.0* 08/04/2014   HDL 26.20* 08/04/2014   LDLDIRECT 150.3 07/19/2013   LDLCALC 83 08/04/2014   ALT 25 10/11/2013   AST 19 10/11/2013   NA 136 04/07/2014   K 5.1 04/07/2014   CL 101 04/07/2014   CREATININE 0.8 04/07/2014   BUN 21 04/07/2014   CO2 22 04/07/2014   TSH 2.76 10/11/2013   INR 1.06 02/01/2012   HGBA1C 8.7* 08/04/2014   MICROALBUR 0.5 10/11/2013

## 2014-08-07 NOTE — Telephone Encounter (Signed)
Pt was confused what medication she should be on.  Levemir taken off the medication list and Lantus put back on.

## 2014-08-17 ENCOUNTER — Telehealth: Payer: Self-pay | Admitting: Internal Medicine

## 2014-08-17 NOTE — Telephone Encounter (Signed)
Patient is requesting re-fill on BD PEN NEEDLE NANO U/F 32G X 4 MM MISC sent to [id number] - [place], [place] - [address]

## 2014-08-18 NOTE — Telephone Encounter (Signed)
Sent in Jan 2016

## 2014-09-04 ENCOUNTER — Other Ambulatory Visit: Payer: Self-pay | Admitting: Internal Medicine

## 2014-09-05 NOTE — Telephone Encounter (Signed)
Sent to the pharmacy by e-scribe. 

## 2014-09-19 ENCOUNTER — Encounter: Payer: Self-pay | Admitting: Internal Medicine

## 2014-09-19 ENCOUNTER — Ambulatory Visit (INDEPENDENT_AMBULATORY_CARE_PROVIDER_SITE_OTHER): Payer: BLUE CROSS/BLUE SHIELD | Admitting: Internal Medicine

## 2014-09-19 VITALS — BP 142/70 | Temp 97.6°F | Ht 63.5 in | Wt 156.0 lb

## 2014-09-19 DIAGNOSIS — E1169 Type 2 diabetes mellitus with other specified complication: Secondary | ICD-10-CM | POA: Diagnosis not present

## 2014-09-19 DIAGNOSIS — R6889 Other general symptoms and signs: Secondary | ICD-10-CM

## 2014-09-19 DIAGNOSIS — E785 Hyperlipidemia, unspecified: Secondary | ICD-10-CM

## 2014-09-19 DIAGNOSIS — I10 Essential (primary) hypertension: Secondary | ICD-10-CM

## 2014-09-19 DIAGNOSIS — E1151 Type 2 diabetes mellitus with diabetic peripheral angiopathy without gangrene: Secondary | ICD-10-CM | POA: Diagnosis not present

## 2014-09-19 MED ORDER — INSULIN PEN NEEDLE 32G X 4 MM MISC: Status: DC

## 2014-09-19 MED ORDER — INSULIN ASPART 100 UNIT/ML FLEXPEN: 10.0000 [IU] | PEN_INJECTOR | SUBCUTANEOUS | Status: DC

## 2014-09-19 NOTE — Patient Instructions (Signed)
Continue on 35 lantus  Per day and 10 novolog with largest meal.  At next refill we need to have you use the pens   Instead of vials. That is what is written    In the EHR.   Sugars are coming down  dont know why you feel cold when sugars are in the 140 range .   Plan hga1c and bmp LIPID panel  end May and then ROV

## 2014-09-19 NOTE — Progress Notes (Signed)
Pre visit review using our clinic review tool, if applicable. No additional management support is needed unless otherwise documented below in the visit note.  Chief Complaint  Patient presents with  . Follow-up    HPI: Kimberly [name] 64 y.o. comesin for Chronic disease management  sincee last visit  For diabetes had to change insulin fro insurance coverage   Up to 35 lantus and 10 nov  Readings coming down. Cold  Feeling with sugars 140.   But better otherwise . Gets cold  Breathing ok    Will pay for lantus and not  Levimir.  Given vials nad not pens  Refill on synynges.  ROS: See pertinent positives and negatives per HPI.  Past Medical History  Diagnosis Date  . CAD (coronary artery disease)   . DM (diabetes mellitus)   . HTN (hypertension)   . Glaucoma   . PVD (peripheral vascular disease)   . Osteoporosis   . COPD (chronic obstructive pulmonary disease)   . History of ventricular fibrillation   . Glaucoma   . Colitis   . Myocardial infarction     12/1998, 01/2011  . Diverticulitis 02/10/2012  . Colitis, indeterminate 01/31/2012    Family History  Problem Relation Age of Onset  . Heart attack Father   . Diabetes Mother   . Hypertension    . Thyroid disease      sisters- sister had parathyroidectomy  . Alcohol abuse      brother  . Colon polyps Maternal Uncle   . Liver cancer Mother   . Breast cancer Sister     History   Social History  . Marital Status: Married    Spouse Name: N/A  . Number of Children: 3  . Years of Education: N/A   Occupational History  . housewife    Social History Main Topics  . Smoking status: Current Every Day Smoker -- 0.40 packs/day    Types: Cigarettes  . Smokeless tobacco: Never Used  . Alcohol Use: No  . Drug Use: No  . Sexual Activity: Not on file   Other Topics Concern  . None   Social History Narrative   Married just widowed    Regular exercise- yes not recently       Quit tobacco in August at heart surgery.  Restarted    Husband diagnosed with liver cancer since 2014 passed April 2015    She was care taker.   No pets    Outpatient Encounter Prescriptions as of 09/19/2014  Medication Sig  . albuterol (PROVENTIL HFA;VENTOLIN HFA) 108 (90 BASE) MCG/ACT inhaler Inhale 2 puffs into the lungs every 6 (six) hours as needed. For shortness of breath or wheezing  . aspirin 325 MG EC tablet Take 325 mg by mouth every morning.   Marland Kitchen atorvastatin (LIPITOR) 80 MG tablet TAKE 1 TABLET (80 MG TOTAL) BY MOUTH DAILY.  Marland Kitchen BAYER CONTOUR NEXT TEST test strip TEST SUGAR UP TO 3 TO 4 TIMES A DAY TO TEST BLOOD SUGAR  . Insulin Glargine (LANTUS) 100 UNIT/ML Solostar Pen Inject 35 Units into the skin at bedtime.   Marland Kitchen lisinopril-hydrochlorothiazide (ZESTORETIC) 20-12.5 MG per tablet Take 1 tablet by mouth daily.  . [DISCONTINUED] BD PEN NEEDLE NANO U/F 32G X 4 MM MISC USE WITH LEVEMIR FLEX PEN  . [DISCONTINUED] insulin aspart (NOVOLOG) 100 UNIT/ML FlexPen Inject 6 Units into the skin 1 day or 1 dose. With largest meal or as directed (Patient taking differently: Inject 10 Units into the  skin 1 day or 1 dose. With largest meal or as directed)  . insulin aspart (NOVOLOG) 100 UNIT/ML FlexPen Inject 10 Units into the skin 1 day or 1 dose. With largest meal or as directed  . Insulin Pen Needle (BD PEN NEEDLE NANO U/F) 32G X 4 MM MISC USE WITH LEVEMIR FLEX PEN    EXAM:  BP 146/72 mmHg  Temp(Src) 97.6 F (36.4 C) (Temporal)  Ht 5' 3.5" (1.613 m)  Wt 156 lb (70.761 kg)  BMI 27.20 kg/m2  Body mass index is 27.2 kg/(m^2).  GENERAL: vitals reviewed and listed above, alert, oriented, appears well hydrated and in no acute distress HEENT: atraumatic, conjunctiva  clear, no obvious abnormalities on inspection of external nose and ears NECK: no obvious masses on inspection palpation  LUNGS: clear to auscultation bilaterally, no wheezes, rales or rhonchi, good air movement CV: HRRR, no clubbing cyanosis or  peripheral edema nl cap  refill  MS: moves all extremities without noticeable focal  abnormality PSYCH: pleasant and cooperative, no obvious depression or anxiety Lab Results  Component Value Date   WBC 7.4 10/11/2013   HGB 15.4* 10/11/2013   HCT 45.7 10/11/2013   PLT 165.0 10/11/2013   GLUCOSE 163* 04/07/2014   CHOL 145 08/04/2014   TRIG 181.0* 08/04/2014   HDL 26.20* 08/04/2014   LDLDIRECT 150.3 07/19/2013   LDLCALC 83 08/04/2014   ALT 25 10/11/2013   AST 19 10/11/2013   NA 136 04/07/2014   K 5.1 04/07/2014   CL 101 04/07/2014   CREATININE 0.8 04/07/2014   BUN 21 04/07/2014   CO2 22 04/07/2014   TSH 2.76 10/11/2013   INR 1.06 02/01/2012   HGBA1C 8.7* 08/04/2014   MICROALBUR 0.5 10/11/2013   bg log reviewed  In last week   Fastin 120 range  Mid day 140 - 180  No lows  ASSESSMENT AND PLAN:  Discussed the following assessment and plan:  Diabetes mellitus with peripheral vascular disease  Cold feeling - when  sugar down to 140 on lantus  Hyperlipidemia associated with type 2 diabetes mellitus  Essential hypertension Pt was given vial and not  Pen of lantus  ( at insurance directed  Change)  Ok to finish use of vial  But   At next refill want her to use the pen  ( safer and more consistent and  Not have to buy extra needles etc)    Dont know why feels cold and shivers " when sugar goes down to 140" she has checked machine with friends and go same readings so felt to be accurate  Uncertain if relalted to insulin of readings or other phenom. However she feels fine otherwise and is happy her readings are improving  So will continue and fu in  May with labs   -Patient advised to return or notify health care team  if symptoms worsen ,persist or new concerns arise.  Patient Instructions  Continue on 35 lantus  Per day and 10 novolog with largest meal.  At next refill we need to have you use the pens   Instead of vials. That is what is written    In the EHR.   Sugars are coming down  dont know why you  feel cold when sugars are in the 140 range .   Plan hga1c and bmp LIPID panel  end May and then [place]. Kagan Hietpas M.D.

## 2014-10-08 ENCOUNTER — Other Ambulatory Visit: Payer: Self-pay | Admitting: Internal Medicine

## 2014-10-09 NOTE — Telephone Encounter (Signed)
Sent to the pharmacy by e-scribe. 

## 2014-10-27 ENCOUNTER — Telehealth: Payer: Self-pay | Admitting: *Deleted

## 2014-10-27 NOTE — Telephone Encounter (Signed)
Pt declined mammogram.  She states her insurance won't pay for it

## 2014-11-01 ENCOUNTER — Other Ambulatory Visit (INDEPENDENT_AMBULATORY_CARE_PROVIDER_SITE_OTHER): Payer: BLUE CROSS/BLUE SHIELD

## 2014-11-01 DIAGNOSIS — I10 Essential (primary) hypertension: Secondary | ICD-10-CM

## 2014-11-01 DIAGNOSIS — E785 Hyperlipidemia, unspecified: Secondary | ICD-10-CM | POA: Diagnosis not present

## 2014-11-01 DIAGNOSIS — E119 Type 2 diabetes mellitus without complications: Secondary | ICD-10-CM | POA: Diagnosis not present

## 2014-11-01 LAB — LIPID PANEL
CHOLESTEROL: 136 mg/dL (ref 0–200)
HDL: 26.1 mg/dL — AB (ref >39.00)
LDL Cholesterol: 72 mg/dL (ref 0–99)
NonHDL: 109.9
TRIGLYCERIDES: 189 mg/dL — AB (ref 0.0–149.0)
Total CHOL/HDL Ratio: 5
VLDL: 37.8 mg/dL (ref 0.0–40.0)

## 2014-11-01 LAB — BASIC METABOLIC PANEL
BUN: 18 mg/dL (ref 6–23)
CO2: 28 meq/L (ref 19–32)
CREATININE: 0.71 mg/dL (ref 0.40–1.20)
Calcium: 10.3 mg/dL (ref 8.4–10.5)
Chloride: 102 mEq/L (ref 96–112)
GFR: 88.41 mL/min (ref >60.00)
Glucose, Bld: 150 mg/dL — ABNORMAL HIGH (ref 70–99)
Potassium: 4.4 mEq/L (ref 3.5–5.1)
Sodium: 136 mEq/L (ref 135–145)

## 2014-11-01 LAB — HEMOGLOBIN A1C: HEMOGLOBIN A1C: 7.6 % — AB (ref 4.6–6.5)

## 2014-11-14 ENCOUNTER — Ambulatory Visit: Payer: BLUE CROSS/BLUE SHIELD | Admitting: Internal Medicine

## 2014-11-21 ENCOUNTER — Ambulatory Visit (INDEPENDENT_AMBULATORY_CARE_PROVIDER_SITE_OTHER): Payer: BLUE CROSS/BLUE SHIELD | Admitting: Internal Medicine

## 2014-11-21 ENCOUNTER — Encounter: Payer: Self-pay | Admitting: Internal Medicine

## 2014-11-21 VITALS — BP 120/80 | HR 86 | Temp 98.7°F | Wt 160.1 lb

## 2014-11-21 DIAGNOSIS — Z720 Tobacco use: Secondary | ICD-10-CM

## 2014-11-21 DIAGNOSIS — R229 Localized swelling, mass and lump, unspecified: Secondary | ICD-10-CM

## 2014-11-21 DIAGNOSIS — F17200 Nicotine dependence, unspecified, uncomplicated: Secondary | ICD-10-CM

## 2014-11-21 DIAGNOSIS — I10 Essential (primary) hypertension: Secondary | ICD-10-CM

## 2014-11-21 DIAGNOSIS — E785 Hyperlipidemia, unspecified: Secondary | ICD-10-CM

## 2014-11-21 DIAGNOSIS — E1151 Type 2 diabetes mellitus with diabetic peripheral angiopathy without gangrene: Secondary | ICD-10-CM

## 2014-11-21 DIAGNOSIS — E1169 Type 2 diabetes mellitus with other specified complication: Secondary | ICD-10-CM

## 2014-11-21 MED ORDER — INSULIN PEN NEEDLE 32G X 4 MM MISC: Status: DC

## 2014-11-21 MED ORDER — INSULIN GLARGINE 100 UNIT/ML SOLOSTAR PEN: 37.0000 [IU] | PEN_INJECTOR | Freq: Every day | SUBCUTANEOUS | Status: DC

## 2014-11-21 NOTE — Progress Notes (Signed)
Chief Complaint  Patient presents with  . Follow-up    labs, lump  . Diabetes  . Hypertension  . Hyperlipidemia    HPI: Kimberly [name] 64 y.o.  comes in for chronic disease/ medication management   bp at home  Good  120/70   Blood sugars improving in the 140-150 range no lows taking Lantus would like to switch over to the pen 35 units at night and taking 10 units of NovoLog her largest meal.  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  Feeling pretty good with no neuropathy chest pain shortness of breath chronic cough.  Still using tobacco down to 4-6 a day.  Has a lump on her left hip thigh that she's had for a while may have been changing is tender when she tries to lay on it and interferes with her sleep at night. ROS: See pertinent positives and negatives per HPI.  Past Medical History  Diagnosis Date  . CAD (coronary artery disease)   . DM (diabetes mellitus)   . HTN (hypertension)   . Glaucoma   . PVD (peripheral vascular disease)   . Osteoporosis   . COPD (chronic obstructive pulmonary disease)   . History of ventricular fibrillation   . Glaucoma   . Colitis   . Myocardial infarction     12/1998, 01/2011  . Diverticulitis 02/10/2012  . Colitis, indeterminate 01/31/2012    Family History  Problem Relation Age of Onset  . Heart attack Father   . Diabetes Mother   . Hypertension    . Thyroid disease      sisters- sister had parathyroidectomy  . Alcohol abuse      brother  . Colon polyps Maternal Uncle   . Liver cancer Mother   . Breast cancer Sister     History   Social History  . Marital Status: Married    Spouse Name: N/A  . Number of Children: 3  . Years of Education: N/A   Occupational History  . housewife    Social History Main Topics  . Smoking status: Current Every Day Smoker -- 0.40 packs/day    Types: Cigarettes  . Smokeless tobacco:  Never Used  . Alcohol Use: No  . Drug Use: No  . Sexual Activity: Not on file   Other Topics Concern  . None   Social History Narrative   Married just widowed    Regular exercise- yes not recently       Quit tobacco in August at heart surgery. Restarted    Husband diagnosed with liver cancer since 2014 passed April 2015    She was care taker.   No pets    Outpatient Prescriptions Prior to Visit  Medication Sig Dispense Refill  . albuterol (PROVENTIL HFA;VENTOLIN HFA) 108 (90 BASE) MCG/ACT inhaler Inhale 2 puffs into the lungs every 6 (six) hours as needed. For shortness of breath or wheezing 1 Inhaler 2  . aspirin 325 MG EC tablet Take 325 mg by mouth every morning.     Marland Kitchen atorvastatin (LIPITOR) 80 MG tablet TAKE 1 TABLET (80 MG TOTAL) BY MOUTH DAILY. 90 tablet 0  . BAYER CONTOUR NEXT TEST test strip TEST SUGAR UP TO 3 TO 4 TIMES A DAY TO TEST BLOOD SUGAR 100 each 11  . insulin aspart (NOVOLOG) 100 UNIT/ML FlexPen Inject 10 Units into the skin 1 day or 1 dose. With largest  meal or as directed 15 mL 1  . lisinopril-hydrochlorothiazide (PRINZIDE,ZESTORETIC) 20-12.5 MG per tablet TAKE 1 TABLET BY MOUTH DAILY. 90 tablet 0  . Insulin Pen Needle (BD PEN NEEDLE NANO U/F) 32G X 4 MM MISC USE WITH LEVEMIR FLEX PEN 100 each 5  . Insulin Glargine (LANTUS) 100 UNIT/ML Solostar Pen Inject 35 Units into the skin at bedtime.      No facility-administered medications prior to visit.     EXAM:  BP 120/80 mmHg  Pulse 86  Temp(Src) 98.7 F (37.1 C) (Oral)  Wt 160 lb 1.6 oz (72.621 kg)  SpO2 97%  Body mass index is 27.91 kg/(m^2).  GENERAL: vitals reviewed and listed above, alert, oriented, appears well hydrated and in no acute distress HEENT: atraumatic, conjunctiva  clear, no obvious abnormalities on inspection of external nose and ears NECK: no obvious masses on inspection palpation  LUNGS: clear to auscultation bilaterally, no wheezes, rales or rhonchi,  CV: HRRR, no clubbing cyanosis  or  peripheral edema nl cap refill  MS: moves all extremities without noticeable focal  Abnormality Left hip skin area about a 6 cm lipomatous like lesion soft but has a center was bruising plush. PSYCH: pleasant and cooperative, no obvious depression or anxiety Lab Results  Component Value Date   WBC 7.4 10/11/2013   HGB 15.4* 10/11/2013   HCT 45.7 10/11/2013   PLT 165.0 10/11/2013   GLUCOSE 150* 11/01/2014   CHOL 136 11/01/2014   TRIG 189.0* 11/01/2014   HDL 26.10* 11/01/2014   LDLDIRECT 150.3 07/19/2013   LDLCALC 72 11/01/2014   ALT 25 10/11/2013   AST 19 10/11/2013   NA 136 11/01/2014   K 4.4 11/01/2014   CL 102 11/01/2014   CREATININE 0.71 11/01/2014   BUN 18 11/01/2014   CO2 28 11/01/2014   TSH 2.76 10/11/2013   INR 1.06 02/01/2012   HGBA1C 7.6* 11/01/2014   MICROALBUR 0.5 10/11/2013   BP Readings from Last 3 Encounters:  11/21/14 120/80  09/19/14 142/70  08/07/14 140/76   Wt Readings from Last 3 Encounters:  11/21/14 160 lb 1.6 oz (72.621 kg)  09/19/14 156 lb (70.761 kg)  08/07/14 156 lb (70.761 kg)    ASSESSMENT AND PLAN:  Discussed the following assessment and plan:  Diabetes mellitus with peripheral vascular disease - A1c improved 7.6 goal would be closer to 7 because of her coronary disease and vascular  Essential hypertension - Improved today 120 at home  Hyperlipidemia associated with type 2 diabetes mellitus - LDL at goal HDL low stop smoking increase exercise stay on atorvastatin  Tobacco use disorder - Counseled to stop has been on small amount for years  Lump of skin left hip  poss lipoma - poss lipoma with sx on laying surgery referral for poss removal - Plan: Ambulatory referral to General Surgery Total visit 45mins > 50% spent counseling and coordinating care as indicated in above note and in instructions to patient .   increase the Lantus to 37 continue monitoring for control lifestyle interventions preventive visit labs and A1c in about 4  months -Patient advised to return or notify health care team  if symptoms worsen ,persist or new concerns arise.  Patient Instructions  Increase lantus to 37 units at night    Continue the good work  suagrs much better .  Still work on stop smoking . An.bp is much better today   No change in other medication  Check up and labs in 4 months fall  Will do a surgery referral .       Standley Brooking. Jasmina Gendron M.D.

## 2014-11-21 NOTE — Progress Notes (Signed)
Pre visit review using our clinic review tool, if applicable. No additional management support is needed unless otherwise documented below in the visit note. 

## 2014-11-21 NOTE — Patient Instructions (Addendum)
Increase lantus to 37 units at night    Continue the good work  Chief of Staff much better .  Still work on stop smoking . An.bp is much better today   No change in other medication  Check up and labs in 4 months fall   Will do a surgery referral .

## 2015-01-08 ENCOUNTER — Other Ambulatory Visit: Payer: Self-pay | Admitting: Internal Medicine

## 2015-01-08 NOTE — Telephone Encounter (Signed)
Sent to the pharmacy by e-scribe.  Pt has upcoming cpx on 05/01/15

## 2015-01-26 ENCOUNTER — Other Ambulatory Visit: Payer: Self-pay | Admitting: Internal Medicine

## 2015-01-26 NOTE — Telephone Encounter (Signed)
Sent to the pharmacy by e-scribe. 

## 2015-02-07 ENCOUNTER — Encounter (HOSPITAL_BASED_OUTPATIENT_CLINIC_OR_DEPARTMENT_OTHER): Payer: Self-pay | Admitting: *Deleted

## 2015-02-08 ENCOUNTER — Encounter (HOSPITAL_BASED_OUTPATIENT_CLINIC_OR_DEPARTMENT_OTHER): Payer: Self-pay

## 2015-02-08 ENCOUNTER — Ambulatory Visit (HOSPITAL_BASED_OUTPATIENT_CLINIC_OR_DEPARTMENT_OTHER)
Admission: RE | Admit: 2015-02-08 | Discharge: 2015-02-08 | Disposition: A | Discharge status: Home / Self Care | Payer: BLUE CROSS/BLUE SHIELD | Source: Ambulatory Visit | Attending: General Surgery | Admitting: General Surgery

## 2015-02-08 ENCOUNTER — Encounter (HOSPITAL_BASED_OUTPATIENT_CLINIC_OR_DEPARTMENT_OTHER): Payer: Self-pay | Admitting: Anesthesiology

## 2015-02-08 ENCOUNTER — Encounter (HOSPITAL_BASED_OUTPATIENT_CLINIC_OR_DEPARTMENT_OTHER)
Admission: RE | Disposition: A | Discharge status: Home / Self Care | Payer: Self-pay | Source: Ambulatory Visit | Attending: General Surgery

## 2015-02-08 DIAGNOSIS — E119 Type 2 diabetes mellitus without complications: Secondary | ICD-10-CM | POA: Insufficient documentation

## 2015-02-08 DIAGNOSIS — Z7982 Long term (current) use of aspirin: Secondary | ICD-10-CM | POA: Diagnosis not present

## 2015-02-08 DIAGNOSIS — I10 Essential (primary) hypertension: Secondary | ICD-10-CM | POA: Diagnosis not present

## 2015-02-08 DIAGNOSIS — Z794 Long term (current) use of insulin: Secondary | ICD-10-CM | POA: Diagnosis not present

## 2015-02-08 DIAGNOSIS — Z951 Presence of aortocoronary bypass graft: Secondary | ICD-10-CM | POA: Diagnosis not present

## 2015-02-08 DIAGNOSIS — D1724 Benign lipomatous neoplasm of skin and subcutaneous tissue of left leg: Secondary | ICD-10-CM | POA: Insufficient documentation

## 2015-02-08 DIAGNOSIS — Z79899 Other long term (current) drug therapy: Secondary | ICD-10-CM | POA: Insufficient documentation

## 2015-02-08 DIAGNOSIS — I252 Old myocardial infarction: Secondary | ICD-10-CM | POA: Insufficient documentation

## 2015-02-08 DIAGNOSIS — E780 Pure hypercholesterolemia: Secondary | ICD-10-CM | POA: Insufficient documentation

## 2015-02-08 HISTORY — PX: LIPOMA EXCISION: SHX5283

## 2015-02-08 SURGERY — MINOR EXCISION LIPOMA
Anesthesia: LOCAL | Site: Thigh | Laterality: Left

## 2015-02-08 MED ORDER — LIDOCAINE HCL (PF) 1 % IJ SOLN
INTRAMUSCULAR | Status: DC | PRN
  Administered 2015-02-08: 4.5 mL

## 2015-02-08 MED ORDER — SODIUM BICARBONATE 4 % IV SOLN
INTRAVENOUS | Status: AC
  Filled 2015-02-08: qty 5

## 2015-02-08 MED ORDER — SODIUM BICARBONATE 4 % IV SOLN
INTRAVENOUS | Status: DC | PRN
  Administered 2015-02-08: 1 mL via SUBCUTANEOUS

## 2015-02-08 MED ORDER — LIDOCAINE HCL (PF) 1 % IJ SOLN
INTRAMUSCULAR | Status: AC
  Filled 2015-02-08: qty 30

## 2015-02-08 MED ORDER — BUPIVACAINE-EPINEPHRINE 0.25% -1:200000 IJ SOLN
INTRAMUSCULAR | Status: DC | PRN
  Administered 2015-02-08: 4.5 mL

## 2015-02-08 MED ORDER — BUPIVACAINE HCL (PF) 0.25 % IJ SOLN
INTRAMUSCULAR | Status: AC
Start: 2015-02-08 — End: 2015-02-08
  Filled 2015-02-08: qty 30

## 2015-02-08 MED ORDER — CIPROFLOXACIN HCL 500 MG PO TABS
500.0000 mg | ORAL_TABLET | Freq: Once | ORAL | Status: AC
  Administered 2015-02-08: 500 mg via ORAL
  Filled 2015-02-08: qty 1

## 2015-02-08 MED ORDER — OXYCODONE-ACETAMINOPHEN 5-325 MG PO TABS: 1.0000 | ORAL_TABLET | Freq: Four times a day (QID) | ORAL | Status: DC | PRN

## 2015-02-08 MED ORDER — BUPIVACAINE-EPINEPHRINE (PF) 0.25% -1:200000 IJ SOLN
INTRAMUSCULAR | Status: AC
  Filled 2015-02-08: qty 30

## 2015-02-08 SURGICAL SUPPLY — 54 items
APL SKNCLS STERI-STRIP NONHPOA (GAUZE/BANDAGES/DRESSINGS) ×2
BENZOIN TINCTURE PRP APPL 2/3 (GAUZE/BANDAGES/DRESSINGS) ×2 IMPLANT
BLADE CLIPPER SURG (BLADE) IMPLANT
BLADE HEX COATED 2.75 (ELECTRODE) IMPLANT
BLADE SURG 15 STRL LF DISP TIS (BLADE) ×2 IMPLANT
BLADE SURG 15 STRL SS (BLADE) ×3
CANISTER SUCT 1200ML W/VALVE (MISCELLANEOUS) IMPLANT
CHLORAPREP W/TINT 26ML (MISCELLANEOUS) ×3 IMPLANT
COVER BACK TABLE 60X90IN (DRAPES) ×3 IMPLANT
COVER MAYO STAND STRL (DRAPES) ×3 IMPLANT
DECANTER SPIKE VIAL GLASS SM (MISCELLANEOUS) ×2 IMPLANT
DRAPE LAPAROTOMY 100X72 PEDS (DRAPES) ×3 IMPLANT
DRAPE UTILITY XL STRL (DRAPES) ×3 IMPLANT
DRSG TEGADERM 4X4.75 (GAUZE/BANDAGES/DRESSINGS) ×2 IMPLANT
ELECT COATED BLADE 2.86 ST (ELECTRODE) IMPLANT
ELECT REM PT RETURN 9FT ADLT (ELECTROSURGICAL) ×3
ELECTRODE REM PT RTRN 9FT ADLT (ELECTROSURGICAL) ×2 IMPLANT
GAUZE PACKING IODOFORM 1/4X15 (GAUZE/BANDAGES/DRESSINGS) IMPLANT
GAUZE SPONGE 4X4 12PLY STRL (GAUZE/BANDAGES/DRESSINGS) IMPLANT
GLOVE BIO SURGEON STRL SZ7.5 (GLOVE) ×3 IMPLANT
GLOVE BIOGEL PI IND STRL 7.0 (GLOVE) ×2 IMPLANT
GLOVE BIOGEL PI IND STRL 8 (GLOVE) ×2 IMPLANT
GLOVE BIOGEL PI INDICATOR 7.0 (GLOVE) ×2
GLOVE BIOGEL PI INDICATOR 8 (GLOVE) ×1
GLOVE ECLIPSE 6.5 STRL STRAW (GLOVE) ×3 IMPLANT
GOWN STRL REUS W/ TWL LRG LVL3 (GOWN DISPOSABLE) ×2 IMPLANT
GOWN STRL REUS W/ TWL XL LVL3 (GOWN DISPOSABLE) ×2 IMPLANT
GOWN STRL REUS W/TWL LRG LVL3 (GOWN DISPOSABLE) ×3
GOWN STRL REUS W/TWL XL LVL3 (GOWN DISPOSABLE) ×3
LIQUID BAND (GAUZE/BANDAGES/DRESSINGS) ×1 IMPLANT
NEEDLE HYPO 25X1 1.5 SAFETY (NEEDLE) ×3 IMPLANT
NS IRRIG 1000ML POUR BTL (IV SOLUTION) ×2 IMPLANT
PACK BASIN DAY SURGERY FS (CUSTOM PROCEDURE TRAY) ×3 IMPLANT
PENCIL BUTTON HOLSTER BLD 10FT (ELECTRODE) ×3 IMPLANT
SPONGE GAUZE 2X2 8PLY STRL LF (GAUZE/BANDAGES/DRESSINGS) ×2 IMPLANT
SPONGE GAUZE 4X4 12PLY STER LF (GAUZE/BANDAGES/DRESSINGS) IMPLANT
STRIP CLOSURE SKIN 1/2X4 (GAUZE/BANDAGES/DRESSINGS) ×2 IMPLANT
SUT ETHILON 2 0 FS 18 (SUTURE) IMPLANT
SUT ETHILON 4 0 PS 2 18 (SUTURE) IMPLANT
SUT MNCRL AB 4-0 PS2 18 (SUTURE) ×3 IMPLANT
SUT SILK 2 0 SH (SUTURE) IMPLANT
SUT VIC AB 2-0 SH 27 (SUTURE)
SUT VIC AB 2-0 SH 27XBRD (SUTURE) IMPLANT
SUT VIC AB 4-0 SH 18 (SUTURE) IMPLANT
SUT VICRYL 3-0 CR8 SH (SUTURE) IMPLANT
SUT VICRYL 4-0 PS2 18IN ABS (SUTURE) IMPLANT
SWAB COLLECTION DEVICE MRSA (MISCELLANEOUS) IMPLANT
SYR CONTROL 10ML LL (SYRINGE) ×3 IMPLANT
TOWEL OR 17X24 6PK STRL BLUE (TOWEL DISPOSABLE) ×3 IMPLANT
TOWEL OR NON WOVEN STRL DISP B (DISPOSABLE) IMPLANT
TUBE ANAEROBIC SPECIMEN COL (MISCELLANEOUS) IMPLANT
TUBE CONNECTING 20X1/4 (TUBING) IMPLANT
UNDERPAD 30X30 (UNDERPADS AND DIAPERS) IMPLANT
YANKAUER SUCT BULB TIP NO VENT (SUCTIONS) IMPLANT

## 2015-02-08 NOTE — Op Note (Signed)
02/08/2015  10:58 AM  PATIENT:  Kimberly [name]  64 y.o. female  PRE-OPERATIVE DIAGNOSIS:  Left Thigh Lipoma  POST-OPERATIVE DIAGNOSIS: Left thigh epidermoid inclusion cyst (4 x 4x 4 cm)  PROCEDURE:  Procedure(s): EXCISION OF LEFT THIGH EPIDERMOID INCLUSION CYST  SURGEON:  Surgeon(s): Greer Pickerel, MD  ASSISTANTS: none   ANESTHESIA:   local  DRAINS: none   LOCAL MEDICATIONS USED:  MARCAINE   , XYLOCAINE , Amount: 8 ml and OTHER bicarb  SPECIMEN:  No Specimen  DISPOSITION OF SPECIMEN:  N/A  COUNTS:  YES  INDICATION FOR PROCEDURE: patient presented with a long-standing left lateral thigh subcutaneous mass and desired surgical removal. We have discussed risk and benefits. Please see my H&P for further details  PROCEDURE: the area was marked with my initials in the holding area with the patient confirming the operative site. She was taken to the OR 6 at [facility] day surgery. She was placed in the lateral position with the appropriate padding. Her left lateral thigh was prepped and draped in the usual standard surgical fashion with ChloraPrep. She received 500 mg of oral ciprofloxacin prior to incision. The area was outlined with a marking pen. I then infiltrated 8 mL of local in a regional fashion. I then made a 4 cm oblique incision with a 15 blade. This was not consistent with a lipoma. It was consistent with a cyst. The cyst was encountered and there is some drainage of typical sebaceous cyst material. Using a hemostat I was able to dissect out the entire sac from the surrounding tissue. Using scissors, electrocautery I was able to excise the entire sac. The cavity was irrigated. Her dermis was quite thin. Therefore I just closed the skin with a running 4-0 Monocryl in a subcuticular fashion. Benzoin, Steri-Strips, gauze and Tegaderm were then applied. She tolerated the procedure well. There were no immediate complications. I provided wound care instructions.  PLAN OF CARE: Discharge to home  after PACU  PATIENT DISPOSITION:  PACU - hemodynamically stable.   Delay start of Pharmacological VTE agent (>24hrs) due to surgical blood loss or risk of bleeding:  not applicable  Leighton Ruff. Redmond Pulling, MD, FACS General, Bariatric, & Minimally Invasive Surgery [facility], [place]

## 2015-02-08 NOTE — H&P (Signed)
Kimberly [name] 12/07/2014 3:09 PM Location: [facility] Patient #: [id number] DOB: [birth year] Widowed / Language: Cleophus Molt / Race: White Female  History of Present Illness Randall Hiss M. Dove Gresham MD; 12/07/2014 3:29 PM) Patient words: lump on upper leg.  The patient is a 64 year old female who presents with a soft tissue mass. She is referred by Dr Regis Bill for evaluation of left lateral thigh lipoma. She states that it has been there for about 10 years. However over the past 3 months she states that it is getting larger. It does not cause her any pain or discomfort at rest. She sleeps on her left side and it will cause her to wake up. She denies any drainage or injury to the area. She states that she has 2 smaller lumps one on her left buttock and one behind her left knee but they do not bother her and she is not interested in having them excised. She denies any fever, chills, nausea, vomiting, chest pain, chest pressure, shortness of breath. She does smoke about 4-5 cigarettes a day. She denies any TIAs or amaurosis fugax.   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 Coronary Artery Bypass Graft  Diagnostic Studies History Elbert Ewings, CMA; 12/07/2014 3:09 PM) Colonoscopy 5-10 years ago Mammogram within last year Pap Smear never  Allergies Elbert Ewings, CMA; 12/07/2014 3:11 PM) MetFORMIN HCl *CHEMICALS* Nausea, Vomiting. Januvia *ANTIDIABETICS*  Medication History Elbert Ewings, CMA; 12/07/2014 3:13 PM) Albuterol Sulfate HFA (108 (90 Base)MCG/ACT Aerosol Soln, Inhalation) Active. Aspirin (325MG  Tablet, Oral) Active. Lipitor (80MG   Tablet, Oral) Active. [employer] (In Vitro) Active. NovoLOG FlexPen (100UNIT/ML Soln Pen-inj, Subcutaneous) Active. Lantus for OptiClik (100UNIT/ML Soln Cartridge, Subcutaneous) Active. Insulin Pen Needle (32G X 4 MM Misc,) Active. Lisinopril-Hydrochlorothiazide (20-12.5MG  Tablet, Oral) Active. Medications Reconciled  Social History Elbert Ewings, [place]; 12/07/2014 3:09 PM) No alcohol use No caffeine use No drug use Tobacco use Current every day smoker.  Family History Elbert Ewings, [place]; 12/07/2014 3:09 PM) Alcohol Abuse Brother, Father, Sister. Arthritis Brother, Father, Sister. Breast Cancer Sister. Diabetes Mellitus Brother, Father, Mother, Sister. Heart Disease Father. Heart disease in female family member before age 37 Hypertension Brother, Father, Mother, Sister. Thyroid problems Sister.  Pregnancy / Birth History Elbert Ewings, [place]; 12/07/2014 3:09 PM) Age at menarche 37 years. Gravida 4 Irregular periods Maternal age 43-20 Para 3  Review of Systems Elbert Ewings CMA; 12/07/2014 3:09 PM) General Not Present- Appetite Loss, Chills, Fatigue, Fever, Night Sweats, Weight Gain and Weight Loss. Skin Present- Change in Wart/Mole. Not Present- Dryness, Hives, Jaundice, New Lesions, Non-Healing Wounds, Rash and Ulcer. HEENT Present- Wears glasses/contact lenses. Not Present- Earache, Hearing Loss, Hoarseness, Nose Bleed, Oral Ulcers, Ringing in the Ears, Seasonal Allergies, Sinus Pain, Sore Throat, Visual Disturbances and Yellow Eyes. Respiratory Not Present- Bloody sputum, Chronic Cough, Difficulty Breathing, Snoring and Wheezing. Breast Not Present- Breast Mass, Breast Pain, Nipple Discharge and Skin Changes. Cardiovascular Not Present- Chest Pain, Difficulty Breathing Lying Down, Leg Cramps, Palpitations, Rapid Heart Rate, Shortness of Breath and Swelling of Extremities. Gastrointestinal Not Present- Abdominal Pain, Bloating, Bloody Stool, Change in Bowel  Habits, Chronic diarrhea, Constipation, Difficulty Swallowing, Excessive gas, Gets full quickly at meals, Hemorrhoids, Indigestion, Nausea, Rectal Pain  and Vomiting. Female Genitourinary Not Present- Frequency, Nocturia, Painful Urination, Pelvic Pain and Urgency. Musculoskeletal Not Present- Back Pain, Joint Pain, Joint Stiffness, Muscle Pain, Muscle Weakness and Swelling of Extremities. Neurological Not Present- Decreased Memory, Fainting, Headaches, Numbness, Seizures, Tingling, Tremor, Trouble walking and Weakness. Psychiatric Not Present- Anxiety, Bipolar, Change in Sleep Pattern, Depression, Fearful and Frequent crying. Endocrine Not Present- Cold Intolerance, Excessive Hunger, Hair Changes, Heat Intolerance, Hot flashes and New Diabetes. Hematology Not Present- Easy Bruising, Excessive bleeding, Gland problems, HIV and Persistent Infections.   Vitals Elbert Ewings CMA; 12/07/2014 3:14 PM) 12/07/2014 3:13 PM Weight: 160 lb Height: 64in Body Surface Area: 1.81 m Body Mass Index: 27.46 kg/m Temp.: 98.25F(Oral)  Pulse: 92 (Regular)  Resp.: 16 (Unlabored)  BP: 124/74 (Sitting, Left Arm, Standard)    Physical Exam Randall Hiss M. Carlise Stofer MD; 12/07/2014 3:30 PM) General Mental Status-Alert. General Appearance-Consistent with stated age. Hydration-Well hydrated. Voice-Normal.  Integumentary Note: left lateral thigh soft tissue mass, soft, nontender, mobile, resolving central bruise, c/w lipoma; 4.5 x 5.5 cm   Head and Neck Head-normocephalic, atraumatic with no lesions or palpable masses. Trachea-midline. Thyroid Gland Characteristics - normal size and consistency.  Eye Eyeball - Bilateral-Extraocular movements intact. Sclera/Conjunctiva - Bilateral-No scleral icterus.  Chest and Lung Exam Chest and lung exam reveals -quiet, even and easy respiratory effort with no use of accessory muscles and on auscultation, normal breath sounds, no adventitious sounds  and normal vocal resonance. Inspection Chest Bissonnette - Normal. Back - normal. Note: old sternotomy incision   Breast - Did not examine.  Cardiovascular Cardiovascular examination reveals -normal heart sounds, regular rate and rhythm with no murmurs and normal pedal pulses bilaterally.  Abdomen Inspection Inspection of the abdomen reveals - No Hernias. Skin - Scar - no surgical scars. Palpation/Percussion Palpation and Percussion of the abdomen reveal - Soft, Non Tender, No Rebound tenderness, No Rigidity (guarding) and No hepatosplenomegaly. Auscultation Auscultation of the abdomen reveals - Bowel sounds normal.  Peripheral Vascular Upper Extremity Palpation - Pulses bilaterally normal.  Neurologic Neurologic evaluation reveals -alert and oriented x 3 with no impairment of recent or remote memory. Mental Status-Normal.  Neuropsychiatric The patient's mood and affect are described as -normal. Judgment and Insight-insight is appropriate concerning matters relevant to self.  Musculoskeletal Normal Exam - Left-Upper Extremity Strength Normal and Lower Extremity Strength Normal. Normal Exam - Right-Upper Extremity Strength Normal and Lower Extremity Strength Normal.  Lymphatic Head & Neck  General Head & Neck Lymphatics: Bilateral - Description - Normal. Axillary - Did not examine. Femoral & Inguinal - Did not examine.    Assessment & Plan Randall Hiss M. Merdith Adan MD; 12/07/2014 3:30 PM) LIPOMA OF LEFT THIGH (214.8  D17.24) Impression: We discussed the etiology and management of lipomas. The patient was given educational material. We discussed that the majority of lipomas are benign although on a rare occasion it can be malignant.  We discussed observation versus surgical excision. We discussed the risks and benefits of surgery including but not limited to bleeding, infection, injury to surrounding structures, scarring, cosmetic concerns, possible temporary drain  placement, blood clot formation, anesthesia issues, possible recurrence, and the typical postoperative course.  The patient has elected to have the left lateral thigh lipoma excised. She would prefer to have it done under local analgesia only. Our office will contact her to schedule surgery.  I did explain that she was at slightly higher risk for wound infection giving her cigarette smoking Current Plans  Schedule for Surgery Pt Education - [facility] Free  Text Education/Instructions: discussed with patient and provided information.  Leighton Ruff. Redmond Pulling, MD, FACS General, Bariatric, & Minimally Invasive Surgery [facility], [place]

## 2015-02-08 NOTE — Interval H&P Note (Signed)
History and Physical Interval Note:  02/08/2015 10:00 AM  Kimberly [name]  has presented today for surgery, with the diagnosis of Left Thigh Lipoma  The various methods of treatment have been discussed with the patient and family. After consideration of risks, benefits and other options for treatment, the patient has consented to  Procedure(s): EXCISION OF LEFT THIGH LIPOMA (Left) as a surgical intervention .  The patient's history has been reviewed, patient examined, no change in status, stable for surgery.  I have reviewed the patient's chart and labs.  Questions were answered to the patient's satisfaction.    Leighton Ruff. Redmond Pulling, MD, [place], Bariatric, & Minimally Invasive Surgery [facility], [place]  [address] [facility] [address]

## 2015-02-08 NOTE — Discharge Instructions (Signed)
Dr Redmond Pulling gave her instructions postop before discharged home  [facility]  UMBILICAL OR INGUINAL HERNIA REPAIR: POST OP INSTRUCTIONS  Always review your discharge instruction sheet given to you by the facility where your surgery was performed. IF YOU HAVE DISABILITY OR FAMILY LEAVE FORMS, YOU MUST BRING THEM TO THE OFFICE FOR PROCESSING.   DO NOT GIVE THEM TO YOUR DOCTOR.  1. A  prescription for pain medication may be given to you upon discharge.  Take your pain medication as prescribed, if needed.  If narcotic pain medicine is not needed, then you may take acetaminophen (Tylenol) or ibuprofen (Advil) as needed. 2. Take your usually prescribed medications unless otherwise directed. 3. If you need a refill on your pain medication, please contact your pharmacy.  They will contact our office to request authorization. Prescriptions will not be filled after 5 pm or on week-ends. 4. You should follow a light diet the first 24 hours after arrival home, such as soup and crackers, etc.  Be sure to include lots of fluids daily.  Resume your normal diet the day after surgery. 5. Most patients will experience some swelling and bruising around the incision.  Ice packs and reclining will help.  Swelling and bruising can take several days to resolve.  6. It is common to experience some constipation if taking pain medication after surgery.  Increasing fluid intake and taking a stool softener (such as Colace) will usually help or prevent this problem from occurring.  A mild laxative (Milk of Magnesia or Miralax) should be taken according to package directions if there are no bowel movements after 48 hours. 7. Unless discharge instructions indicate otherwise, you may remove your bandages 48 hours after surgery, and you may shower at that time.  You  have steri-strips (small skin tapes) in place directly over the incision.  These strips should be left on the skin for 7-10 days.  8. ACTIVITIES:   You may resume regular (light) daily activities beginning the next day--such as daily self-care, walking, climbing stairs--gradually increasing activities as tolerated.  You may have sexual intercourse when it is comfortable.  Refrain from any heavy lifting or straining until approved by your doctor. a. You may drive when you are no longer taking prescription pain medication, you can comfortably wear a seatbelt, and you can safely maneuver your car and apply brakes. b. RETURN TO WORK:  9. You should see your doctor in the office for a follow-up appointment approximately 2-3 weeks after your surgery.  Make sure that you call for this appointment within a day or two after you arrive home to insure a convenient appointment time. 10. OTHER INSTRUCTIONS:     WHEN TO CALL YOUR DOCTOR: 1. Fever over 101.0 2. Inability to urinate 3. Nausea and/or vomiting 4. Extreme swelling or bruising 5. Continued bleeding from incision. 6. Increased pain, redness, or drainage from the incision  The clinic staff is available to answer your questions during regular business hours.  Please dont hesitate to call and ask to speak to one of the nurses for clinical concerns.  If you have a medical emergency, go to the nearest emergency room or call 911.  A surgeon from [facility] is always on call at the hospital   [address], [place], [place], [place]  [address] ?  [address], [place], [place]   [address] [phone] ? [phone] ? FAX [phone] Web site: [email or web address]

## 2015-02-09 ENCOUNTER — Encounter (HOSPITAL_BASED_OUTPATIENT_CLINIC_OR_DEPARTMENT_OTHER): Payer: Self-pay | Admitting: General Surgery

## 2015-02-14 LAB — HM DIABETES EYE EXAM

## 2015-02-16 ENCOUNTER — Encounter: Payer: Self-pay | Admitting: Family Medicine

## 2015-02-22 ENCOUNTER — Other Ambulatory Visit: Payer: Self-pay | Admitting: Internal Medicine

## 2015-02-22 NOTE — Telephone Encounter (Signed)
Sent to the pharmacy by e-scribe. 

## 2015-03-22 ENCOUNTER — Other Ambulatory Visit: Payer: Self-pay | Admitting: Internal Medicine

## 2015-03-22 NOTE — Telephone Encounter (Signed)
Sent to the pharmacy by e-scribe. 

## 2015-04-24 ENCOUNTER — Other Ambulatory Visit (INDEPENDENT_AMBULATORY_CARE_PROVIDER_SITE_OTHER): Payer: BLUE CROSS/BLUE SHIELD

## 2015-04-24 ENCOUNTER — Encounter: Payer: Self-pay | Admitting: Internal Medicine

## 2015-04-24 DIAGNOSIS — R7989 Other specified abnormal findings of blood chemistry: Secondary | ICD-10-CM | POA: Diagnosis not present

## 2015-04-24 DIAGNOSIS — Z0000 Encounter for general adult medical examination without abnormal findings: Secondary | ICD-10-CM | POA: Diagnosis not present

## 2015-04-24 LAB — CBC WITH DIFFERENTIAL/PLATELET
BASOS PCT: 0.4 % (ref 0.0–3.0)
Basophils Absolute: 0 10*3/uL (ref 0.0–0.1)
EOS ABS: 0.2 10*3/uL (ref 0.0–0.7)
Eosinophils Relative: 3.2 % (ref 0.0–5.0)
HEMATOCRIT: 47.3 % — AB (ref 36.0–46.0)
HEMOGLOBIN: 16 g/dL — AB (ref 12.0–15.0)
LYMPHS PCT: 26.9 % (ref 12.0–46.0)
Lymphs Abs: 1.7 10*3/uL (ref 0.7–4.0)
MCHC: 33.7 g/dL (ref 30.0–36.0)
MCV: 89.5 fl (ref 78.0–100.0)
MONOS PCT: 6.6 % (ref 3.0–12.0)
Monocytes Absolute: 0.4 10*3/uL (ref 0.1–1.0)
NEUTROS ABS: 3.9 10*3/uL (ref 1.4–7.7)
Neutrophils Relative %: 62.9 % (ref 43.0–77.0)
PLATELETS: 141 10*3/uL — AB (ref 150.0–400.0)
RBC: 5.29 Mil/uL — ABNORMAL HIGH (ref 3.87–5.11)
RDW: 14 % (ref 11.5–15.5)
WBC: 6.3 10*3/uL (ref 4.0–10.5)

## 2015-04-24 LAB — LIPID PANEL
CHOL/HDL RATIO: 6
CHOLESTEROL: 148 mg/dL (ref 0–200)
HDL: 25.6 mg/dL — ABNORMAL LOW (ref >39.00)
NONHDL: 122.42
TRIGLYCERIDES: 213 mg/dL — AB (ref 0.0–149.0)
VLDL: 42.6 mg/dL — AB (ref 0.0–40.0)

## 2015-04-24 LAB — HEPATIC FUNCTION PANEL
ALBUMIN: 4.2 g/dL (ref 3.5–5.2)
ALK PHOS: 90 U/L (ref 39–117)
ALT: 21 U/L (ref 0–35)
AST: 17 U/L (ref 0–37)
BILIRUBIN DIRECT: 0.1 mg/dL (ref 0.0–0.3)
TOTAL PROTEIN: 7.2 g/dL (ref 6.0–8.3)
Total Bilirubin: 0.6 mg/dL (ref 0.2–1.2)

## 2015-04-24 LAB — TSH: TSH: 3.3 u[IU]/mL (ref 0.35–4.50)

## 2015-04-24 LAB — MICROALBUMIN / CREATININE URINE RATIO
Creatinine,U: 62.9 mg/dL
MICROALB UR: 0.9 mg/dL (ref 0.0–1.9)
Microalb Creat Ratio: 1.4 mg/g (ref 0.0–30.0)

## 2015-04-24 LAB — BASIC METABOLIC PANEL
BUN: 14 mg/dL (ref 6–23)
CHLORIDE: 101 meq/L (ref 96–112)
CO2: 29 meq/L (ref 19–32)
Calcium: 10.4 mg/dL (ref 8.4–10.5)
Creatinine, Ser: 0.69 mg/dL (ref 0.40–1.20)
GFR: 91.23 mL/min (ref >60.00)
Glucose, Bld: 243 mg/dL — ABNORMAL HIGH (ref 70–99)
POTASSIUM: 4.9 meq/L (ref 3.5–5.1)
SODIUM: 138 meq/L (ref 135–145)

## 2015-04-24 LAB — LDL CHOLESTEROL, DIRECT: Direct LDL: 87 mg/dL

## 2015-04-24 LAB — HEMOGLOBIN A1C: Hgb A1c MFr Bld: 9.1 % — ABNORMAL HIGH (ref 4.6–6.5)

## 2015-05-01 ENCOUNTER — Encounter: Payer: Self-pay | Admitting: Internal Medicine

## 2015-05-01 ENCOUNTER — Ambulatory Visit (INDEPENDENT_AMBULATORY_CARE_PROVIDER_SITE_OTHER): Payer: BLUE CROSS/BLUE SHIELD | Admitting: Internal Medicine

## 2015-05-01 VITALS — BP 136/82 | Temp 98.0°F | Ht 63.0 in | Wt 162.1 lb

## 2015-05-01 DIAGNOSIS — Z23 Encounter for immunization: Secondary | ICD-10-CM | POA: Diagnosis not present

## 2015-05-01 DIAGNOSIS — F17200 Nicotine dependence, unspecified, uncomplicated: Secondary | ICD-10-CM

## 2015-05-01 DIAGNOSIS — E785 Hyperlipidemia, unspecified: Secondary | ICD-10-CM

## 2015-05-01 DIAGNOSIS — E1151 Type 2 diabetes mellitus with diabetic peripheral angiopathy without gangrene: Secondary | ICD-10-CM | POA: Diagnosis not present

## 2015-05-01 DIAGNOSIS — Z0000 Encounter for general adult medical examination without abnormal findings: Secondary | ICD-10-CM

## 2015-05-01 DIAGNOSIS — E1169 Type 2 diabetes mellitus with other specified complication: Secondary | ICD-10-CM | POA: Diagnosis not present

## 2015-05-01 DIAGNOSIS — I10 Essential (primary) hypertension: Secondary | ICD-10-CM | POA: Diagnosis not present

## 2015-05-01 DIAGNOSIS — R011 Cardiac murmur, unspecified: Secondary | ICD-10-CM

## 2015-05-01 MED ORDER — INSULIN GLARGINE 100 UNIT/ML SOLOSTAR PEN: 40.0000 [IU] | PEN_INJECTOR | Freq: Every day | SUBCUTANEOUS | Status: DC

## 2015-05-01 NOTE — Addendum Note (Signed)
Addended by: Miles Costain T on: 05/01/2015 02:53 PM   Modules accepted: Orders

## 2015-05-01 NOTE — Progress Notes (Signed)
Pre visit review using our clinic review tool, if applicable. No additional management support is needed unless otherwise documented below in the visit note.  Chief Complaint  Patient presents with  . Annual Exam  . Diabetes  . Hypertension  . Hyperlipidemia    HPI: Patient  Kimberly [name]  64 y.o. comes in today for Preventive Health Care visit  And Chronic disease management DM in last 2 months bG up in am inc lantus  Not exercising as much cause caretaking  Elderly couple.  Tobacco down  To 4 per day   Had lipoma removed . bp ok  No claudication cv sx  Has early cataract right  Taking estroven cause of hot flushes at times    Health Maintenance  Topic Date Due  . PAP SMEAR  03/23/2014  . FOOT EXAM  07/19/2014  . INFLUENZA VACCINE  01/15/2015  . COLONOSCOPY  04/24/2015  . HIV Screening  04/29/2016 (Originally 12/21/1965)  . HEMOGLOBIN A1C  10/22/2015  . OPHTHALMOLOGY EXAM  02/14/2016  . URINE MICROALBUMIN  04/23/2016  . TETANUS/TDAP  01/31/2019  . PNEUMOCOCCAL POLYSACCHARIDE VACCINE  Completed  . ZOSTAVAX  Completed  . Hepatitis C Screening  Completed   Health Maintenance Review LIFESTYLE:  Exercise:  Not as much but active  Tobacco/ETS:4 per day Alcohol: per day n Sugar beverages:no Sleep:7.5 hours  Drug use: no :   ROS:  GEN/ HEENT: No fever, significant weight changes sweats headaches vision problems hearing changes, CV/ PULM; No chest pain shortness of breath cough, syncope,edema  change in exercise tolerance. GI /GU: No adominal pain, vomiting, change in bowel habits. No blood in the stool. No significant GU symptoms. SKIN/HEME: ,no acute skin rashes suspicious lesions or bleeding. No lymphadenopathy, nodules, masses.  NEURO/ PSYCH:  No neurologic signs such as weakness numbness. No depression anxiety. IMM/ Allergy: No unusual infections.  Allergy .   REST of 12 system review negative except as per HPI   Past Medical History  Diagnosis Date  . CAD  (coronary artery disease)   . DM (diabetes mellitus) ([place])   . HTN (hypertension)   . Glaucoma   . PVD (peripheral vascular disease) ([place])   . Osteoporosis   . COPD (chronic obstructive pulmonary disease) ([place])   . History of ventricular fibrillation   . Glaucoma   . Colitis   . Myocardial infarction ([place])     12/1998, 01/2011  . Diverticulitis 02/10/2012  . Colitis, indeterminate 01/31/2012    Past Surgical History  Procedure Laterality Date  . Cabg x 3  01/22/2011    Dr Cyndia Bent  . Cardiac catheterization  01/17/2011    Dr Arvella Merles  . S/p myocardial infarction and pci of the lt circumflex  2000  . S/p aortobifemoral bypass  2008    Dr Donnetta Hutching  . Abdominal hysterectomy      endometriosis  . Lipoma excision Left 02/08/2015    Procedure: EXCISION OF LEFT THIGH LIPOMA;  Surgeon: Greer Pickerel, MD;  Location: [facility];  Service: General;  Laterality: Left;    Family History  Problem Relation Age of Onset  . Heart attack Father   . Diabetes Mother   . Hypertension    . Thyroid disease      sisters- sister had parathyroidectomy  . Alcohol abuse      brother  . Colon polyps Maternal Uncle   . Liver cancer Mother   . Breast cancer Sister     Social History  Social History  . Marital Status: Married    Spouse Name: N/A  . Number of Children: 3  . Years of Education: N/A   Occupational History  . housewife    Social History Main Topics  . Smoking status: Current Every Day Smoker -- 0.40 packs/day    Types: Cigarettes  . Smokeless tobacco: Never Used  . Alcohol Use: No  . Drug Use: No  . Sexual Activity: Not Asked   Other Topics Concern  . None   Social History Narrative   Married just widowed    Regular exercise- yes not recently       Quit tobacco in August at heart surgery. Restarted    Husband diagnosed with liver cancer since 2014 passed April 2015    She was care taker.   No pets    Outpatient Prescriptions Prior to Visit  Medication Sig  Dispense Refill  . albuterol (PROVENTIL HFA;VENTOLIN HFA) 108 (90 BASE) MCG/ACT inhaler Inhale 2 puffs into the lungs every 6 (six) hours as needed. For shortness of breath or wheezing 1 Inhaler 2  . aspirin 325 MG EC tablet Take 325 mg by mouth every morning.     Marland Kitchen atorvastatin (LIPITOR) 80 MG tablet TAKE 1 TABLET (80 MG TOTAL) BY MOUTH DAILY. 90 tablet 1  . BAYER CONTOUR NEXT TEST test strip TEST SUGAR UP TO 3 TO 4 TIMES A DAY TO TEST BLOOD SUGAR 100 each 11  . Insulin Pen Needle (BD PEN NEEDLE NANO U/F) 32G X 4 MM MISC USE WITH LEVEMIR FLEX PEN 100 each 5  . lisinopril-hydrochlorothiazide (PRINZIDE,ZESTORETIC) 20-12.5 MG per tablet TAKE 1 TABLET BY MOUTH DAILY. 90 tablet 1  . NOVOLOG FLEXPEN 100 UNIT/ML FlexPen INJECT 6 UNITS INTO THE SKIN FOR 1 DOSE. WITH LARGEST MEAL OR AS DIRECTED (Patient taking differently: INJECT 20 UNITS INTO THE SKIN FOR 1 DOSE. WITH LARGEST MEAL OR AS DIRECTED) 15 mL 0  . Insulin Glargine (LANTUS) 100 UNIT/ML Solostar Pen Inject 37 Units into the skin at bedtime. (Patient taking differently: Inject 40 Units into the skin at bedtime. ) 15 mL 3  . insulin aspart (NOVOLOG) 100 UNIT/ML FlexPen Inject 10 Units into the skin 1 day or 1 dose. With largest meal or as directed 15 mL 1  . oxyCODONE-acetaminophen (ROXICET) 5-325 MG per tablet Take 1 tablet by mouth every 6 (six) hours as needed for severe pain. 20 tablet 0   No facility-administered medications prior to visit.     EXAM:  BP 136/82 mmHg  Temp(Src) 98 F (36.7 C) (Oral)  Ht $R'5\' 3"'BK$  (1.6 m)  Wt 162 lb 1.6 oz (73.528 kg)  BMI 28.72 kg/m2  Body mass index is 28.72 kg/(m^2).  Physical Exam: Vital signs reviewed EXN:TZGY is a well-developed well-nourished alert cooperative    who appearsr stated age in no acute distress.  HEENT: normocephalic atraumatic , Eyes: PERRL EOM's full, conjunctiva clear, Nares: paten,t no deformity discharge or tenderness., Ears: no deformity EAC's clear TMs with normal landmarks.  Mouth: clear OP, no lesions, edema.  Moist mucous membranes. Dentition in adequate repair. NECK: supple without masses, thyromegaly or bruits. CHEST/PULM:  Clear to auscultation and percussion breath sounds equal no wheeze , rales or rhonchi. No chest Sankey deformities or tenderness.Breast: normal by inspection . No dimpling, discharge, masses, tenderness or discharge . CV: PMI is nondisplaced, S1 S2 no gallops,2/6 sem upper sb murmur   Diastole clear ? murmurs, rubs. Peripheral pulses are full without delay.No JVD .  ABDOMEN: Bowel sounds normal nontender  No guard or rebound, no hepato splenomegal no CVA tenderness.  Extremtities:  No clubbing cyanosis or edema, no acute joint swelling or redness no focal atrophy NEURO:  Oriented x3, cranial nerves 3-12 appear to be intact, no obvious focal weakness,gait within normal limits no abnormal reflexes or asymmetrical SKIN: No acute rashes normal turgor, color, no bruising or petechiae. PSYCH: Oriented, good eye contact, no obvious depression anxiety, cognition and judgment appear normal. LN: no cervical axillary inguinal adenopathy Diabetic Foot Exam - Simple   Simple Foot Form  Diabetic Foot exam was performed with the following findings:  Yes 05/01/2015  2:23 PM  Visual Inspection  No deformities, no ulcerations, no other skin breakdown bilaterally:  Yes  Sensation Testing  Intact to touch and monofilament testing bilaterally:  Yes  Pulse Check  Posterior Tibialis and Dorsalis pulse intact bilaterally:  Yes  Comments       Lab Results  Component Value Date   WBC 6.3 04/24/2015   HGB 16.0* 04/24/2015   HCT 47.3* 04/24/2015   PLT 141.0* 04/24/2015   GLUCOSE 243* 04/24/2015   CHOL 148 04/24/2015   TRIG 213.0* 04/24/2015   HDL 25.60* 04/24/2015   LDLDIRECT 87.0 04/24/2015   LDLCALC 72 11/01/2014   ALT 21 04/24/2015   AST 17 04/24/2015   NA 138 04/24/2015   K 4.9 04/24/2015   CL 101 04/24/2015   CREATININE 0.69 04/24/2015   BUN 14  04/24/2015   CO2 29 04/24/2015   TSH 3.30 04/24/2015   INR 1.06 02/01/2012   HGBA1C 9.1* 04/24/2015   MICROALBUR 0.9 04/24/2015    ASSESSMENT AND PLAN:  Discussed the following assessment and plan:  Visit for preventive health examination  Diabetes mellitus with peripheral vascular disease ([place]) - recent deterioratinos  hx se of some drug classes :   refer to endocrinology for most time/cost efficent   intervention.  to get back control  - Plan: Ambulatory referral to Endocrinology  Essential hypertension - controlled  Hyperlipidemia associated with type 2 diabetes mellitus ([place]) - ldl at goal tg up cause of hyperglycemia   Tobacco use disorder - 4 per day wok on cessation  SYSTOLIC MURMUR - last echo 2013   Diabetes now not in control  I want her to see  endocrine Patient Care Team: Burnis Medin, MD as PCP - General Thayer Headings, MD (Cardiology) Gaye Pollack, MD (Cardiothoracic Surgery) Rosetta Posner, MD (Thoracic Diseases) Josue Hector, MD (Cardiology) Patient Instructions  Diabetes needs better control  Would like you to see specialist  You will be contacted about  Appt. In the interim increase lantus to 46 units  .   After   1-2 weeks send in readings if havent seen specialist yet  There are other med  To try  Uncertain of cost and most cost effective for your situation.  Try to get in more exercise as before.  Continue efforts for tobacco cessation.   Health Maintenance, Female Adopting a healthy lifestyle and getting preventive care can go a long way to promote health and wellness. Talk with your health care provider about what schedule of regular examinations is right for you. This is a good chance for you to check in with your provider about disease prevention and staying healthy. In between checkups, there are plenty of things you can do on your own. Experts have done a lot of research about which lifestyle changes and preventive measures are most  likely to  keep you healthy. Ask your health care provider for more information. WEIGHT AND DIET  Eat a healthy diet  Be sure to include plenty of vegetables, fruits, low-fat dairy products, and lean protein.  Do not eat a lot of foods high in solid fats, added sugars, or salt.  Get regular exercise. This is one of the most important things you can do for your health.  Most adults should exercise for at least 150 minutes each week. The exercise should increase your heart rate and make you sweat (moderate-intensity exercise).  Most adults should also do strengthening exercises at least twice a week. This is in addition to the moderate-intensity exercise.  Maintain a healthy weight  Body mass index (BMI) is a measurement that can be used to identify possible weight problems. It estimates body fat based on height and weight. Your health care provider can help determine your BMI and help you achieve or maintain a healthy weight.  For females 62 years of age and older:   A BMI below 18.5 is considered underweight.  A BMI of 18.5 to 24.9 is normal.  A BMI of 25 to 29.9 is considered overweight.  A BMI of 30 and above is considered obese.  Watch levels of cholesterol and blood lipids  You should start having your blood tested for lipids and cholesterol at 63 years of age, then have this test every 5 years.  You may need to have your cholesterol levels checked more often if:  Your lipid or cholesterol levels are high.  You are older than 63 years of age.  You are at high risk for heart disease.  CANCER SCREENING   Lung Cancer  Lung cancer screening is recommended for adults 50-50 years old who are at high risk for lung cancer because of a history of smoking.  A yearly low-dose CT scan of the lungs is recommended for people who:  Currently smoke.  Have quit within the past 15 years.  Have at least a 30-pack-year history of smoking. A pack year is smoking an average of one pack of  cigarettes a day for 1 year.  Yearly screening should continue until it has been 15 years since you quit.  Yearly screening should stop if you develop a health problem that would prevent you from having lung cancer treatment.  Breast Cancer  Practice breast self-awareness. This means understanding how your breasts normally appear and feel.  It also means doing regular breast self-exams. Let your health care provider know about any changes, no matter how small.  If you are in your 20s or 30s, you should have a clinical breast exam (CBE) by a health care provider every 1-3 years as part of a regular health exam.  If you are 40 or older, have a CBE every year. Also consider having a breast X-ray (mammogram) every year.  If you have a family history of breast cancer, talk to your health care provider about genetic screening.  If you are at high risk for breast cancer, talk to your health care provider about having an MRI and a mammogram every year.  Breast cancer gene (BRCA) assessment is recommended for women who have family members with BRCA-related cancers. BRCA-related cancers include:  Breast.  Ovarian.  Tubal.  Peritoneal cancers.  Results of the assessment will determine the need for genetic counseling and BRCA1 and BRCA2 testing. Cervical Cancer Your health care provider may recommend that you be screened regularly for cancer of the  pelvic organs (ovaries, uterus, and vagina). This screening involves a pelvic examination, including checking for microscopic changes to the surface of your cervix (Pap test). You may be encouraged to have this screening done every 3 years, beginning at age 98.  For women ages 65-65, health care providers may recommend pelvic exams and Pap testing every 3 years, or they may recommend the Pap and pelvic exam, combined with testing for human papilloma virus (HPV), every 5 years. Some types of HPV increase your risk of cervical cancer. Testing for HPV  may also be done on women of any age with unclear Pap test results.  Other health care providers may not recommend any screening for nonpregnant women who are considered low risk for pelvic cancer and who do not have symptoms. Ask your health care provider if a screening pelvic exam is right for you.  If you have had past treatment for cervical cancer or a condition that could lead to cancer, you need Pap tests and screening for cancer for at least 20 years after your treatment. If Pap tests have been discontinued, your risk factors (such as having a new sexual partner) need to be reassessed to determine if screening should resume. Some women have medical problems that increase the chance of getting cervical cancer. In these cases, your health care provider may recommend more frequent screening and Pap tests. Colorectal Cancer  This type of cancer can be detected and often prevented.  Routine colorectal cancer screening usually begins at 63 years of age and continues through 63 years of age.  Your health care provider may recommend screening at an earlier age if you have risk factors for colon cancer.  Your health care provider may also recommend using home test kits to check for hidden blood in the stool.  A small camera at the end of a tube can be used to examine your colon directly (sigmoidoscopy or colonoscopy). This is done to check for the earliest forms of colorectal cancer.  Routine screening usually begins at age 37.  Direct examination of the colon should be repeated every 5-10 years through 63 years of age. However, you may need to be screened more often if early forms of precancerous polyps or small growths are found. Skin Cancer  Check your skin from head to toe regularly.  Tell your health care provider about any new moles or changes in moles, especially if there is a change in a mole's shape or color.  Also tell your health care provider if you have a mole that is larger than  the size of a pencil eraser.  Always use sunscreen. Apply sunscreen liberally and repeatedly throughout the day.  Protect yourself by wearing long sleeves, pants, a wide-brimmed hat, and sunglasses whenever you are outside. HEART DISEASE, DIABETES, AND HIGH BLOOD PRESSURE   High blood pressure causes heart disease and increases the risk of stroke. High blood pressure is more likely to develop in:  People who have blood pressure in the high end of the normal range (130-139/85-89 mm Hg).  People who are overweight or obese.  People who are African American.  If you are 16-81 years of age, have your blood pressure checked every 3-5 years. If you are 37 years of age or older, have your blood pressure checked every year. You should have your blood pressure measured twice--once when you are at a hospital or clinic, and once when you are not at a hospital or clinic. Record the average of the two  measurements. To check your blood pressure when you are not at a hospital or clinic, you can use:  An automated blood pressure machine at a pharmacy.  A home blood pressure monitor.  If you are between 70 years and 38 years old, ask your health care provider if you should take aspirin to prevent strokes.  Have regular diabetes screenings. This involves taking a blood sample to check your fasting blood sugar level.  If you are at a normal weight and have a low risk for diabetes, have this test once every three years after 63 years of age.  If you are overweight and have a high risk for diabetes, consider being tested at a younger age or more often. PREVENTING INFECTION  Hepatitis B  If you have a higher risk for hepatitis B, you should be screened for this virus. You are considered at high risk for hepatitis B if:  You were born in a country where hepatitis B is common. Ask your health care provider which countries are considered high risk.  Your parents were born in a high-risk country, and you  have not been immunized against hepatitis B (hepatitis B vaccine).  You have HIV or AIDS.  You use needles to inject street drugs.  You live with someone who has hepatitis B.  You have had sex with someone who has hepatitis B.  You get hemodialysis treatment.  You take certain medicines for conditions, including cancer, organ transplantation, and autoimmune conditions. Hepatitis C  Blood testing is recommended for:  Everyone born from [birth year] through 1964.  Anyone with known risk factors for hepatitis C. Sexually transmitted infections (STIs)  You should be screened for sexually transmitted infections (STIs) including gonorrhea and chlamydia if:  You are sexually active and are younger than 63 years of age.  You are older than 63 years of age and your health care provider tells you that you are at risk for this type of infection.  Your sexual activity has changed since you were last screened and you are at an increased risk for chlamydia or gonorrhea. Ask your health care provider if you are at risk.  If you do not have HIV, but are at risk, it may be recommended that you take a prescription medicine daily to prevent HIV infection. This is called pre-exposure prophylaxis (PrEP). You are considered at risk if:  You are sexually active and do not regularly use condoms or know the HIV status of your partner(s).  You take drugs by injection.  You are sexually active with a partner who has HIV. Talk with your health care provider about whether you are at high risk of being infected with HIV. If you choose to begin PrEP, you should first be tested for HIV. You should then be tested every 3 months for as long as you are taking PrEP.  PREGNANCY   If you are premenopausal and you may become pregnant, ask your health care provider about preconception counseling.  If you may become pregnant, take 400 to 800 micrograms (mcg) of folic acid every day.  If you want to prevent pregnancy,  talk to your health care provider about birth control (contraception). OSTEOPOROSIS AND MENOPAUSE   Osteoporosis is a disease in which the bones lose minerals and strength with aging. This can result in serious bone fractures. Your risk for osteoporosis can be identified using a bone density scan.  If you are 64 years of age or older, or if you are at risk for  osteoporosis and fractures, ask your health care provider if you should be screened.  Ask your health care provider whether you should take a calcium or vitamin D supplement to lower your risk for osteoporosis.  Menopause may have certain physical symptoms and risks.  Hormone replacement therapy may reduce some of these symptoms and risks. Talk to your health care provider about whether hormone replacement therapy is right for you.  HOME CARE INSTRUCTIONS   Schedule regular health, dental, and eye exams.  Stay current with your immunizations.   Do not use any tobacco products including cigarettes, chewing tobacco, or electronic cigarettes.  If you are pregnant, do not drink alcohol.  If you are breastfeeding, limit how much and how often you drink alcohol.  Limit alcohol intake to no more than 1 drink per day for nonpregnant women. One drink equals 12 ounces of beer, 5 ounces of wine, or 1 ounces of hard liquor.  Do not use street drugs.  Do not share needles.  Ask your health care provider for help if you need support or information about quitting drugs.  Tell your health care provider if you often feel depressed.  Tell your health care provider if you have ever been abused or do not feel safe at home.   This information is not intended to replace advice given to you by your health care provider. Make sure you discuss any questions you have with your health care provider.   Document Released: 12/16/2010 Document Revised: 06/23/2014 Document Reviewed: 05/04/2013 Elsevier Interactive Patient Education 2016 [place]. Martie Fulgham M.D.

## 2015-05-01 NOTE — Patient Instructions (Addendum)
Diabetes needs better control  Would like you to see specialist  You will be contacted about  Appt. In the interim increase lantus to 46 units  .   After   1-2 weeks send in readings if havent seen specialist yet  There are other med  To try  Uncertain of cost and most cost effective for your situation.  Try to get in more exercise as before.  Continue efforts for tobacco cessation.   Health Maintenance, Female Adopting a healthy lifestyle and getting preventive care can go a long way to promote health and wellness. Talk with your health care provider about what schedule of regular examinations is right for you. This is a good chance for you to check in with your provider about disease prevention and staying healthy. In between checkups, there are plenty of things you can do on your own. Experts have done a lot of research about which lifestyle changes and preventive measures are most likely to keep you healthy. Ask your health care provider for more information. WEIGHT AND DIET  Eat a healthy diet  Be sure to include plenty of vegetables, fruits, low-fat dairy products, and lean protein.  Do not eat a lot of foods high in solid fats, added sugars, or salt.  Get regular exercise. This is one of the most important things you can do for your health.  Most adults should exercise for at least 150 minutes each week. The exercise should increase your heart rate and make you sweat (moderate-intensity exercise).  Most adults should also do strengthening exercises at least twice a week. This is in addition to the moderate-intensity exercise.  Maintain a healthy weight  Body mass index (BMI) is a measurement that can be used to identify possible weight problems. It estimates body fat based on height and weight. Your health care provider can help determine your BMI and help you achieve or maintain a healthy weight.  For females 63 years of age and older:   A BMI below 18.5 is considered  underweight.  A BMI of 18.5 to 24.9 is normal.  A BMI of 25 to 29.9 is considered overweight.  A BMI of 30 and above is considered obese.  Watch levels of cholesterol and blood lipids  You should start having your blood tested for lipids and cholesterol at 63 years of age, then have this test every 5 years.  You may need to have your cholesterol levels checked more often if:  Your lipid or cholesterol levels are high.  You are older than 63 years of age.  You are at high risk for heart disease.  CANCER SCREENING   Lung Cancer  Lung cancer screening is recommended for adults 63-63 years old who are at high risk for lung cancer because of a history of smoking.  A yearly low-dose CT scan of the lungs is recommended for people who:  Currently smoke.  Have quit within the past 15 years.  Have at least a 30-pack-year history of smoking. A pack year is smoking an average of one pack of cigarettes a day for 1 year.  Yearly screening should continue until it has been 15 years since you quit.  Yearly screening should stop if you develop a health problem that would prevent you from having lung cancer treatment.  Breast Cancer  Practice breast self-awareness. This means understanding how your breasts normally appear and feel.  It also means doing regular breast self-exams. Let your health care provider know about any  changes, no matter how small.  If you are in your 63s or 63s, you should have a clinical breast exam (CBE) by a health care provider every 1-3 years as part of a regular health exam.  If you are 63 or older, have a CBE every year. Also consider having a breast X-ray (mammogram) every year.  If you have a family history of breast cancer, talk to your health care provider about genetic screening.  If you are at high risk for breast cancer, talk to your health care provider about having an MRI and a mammogram every year.  Breast cancer gene (BRCA) assessment is  recommended for women who have family members with BRCA-related cancers. BRCA-related cancers include:  Breast.  Ovarian.  Tubal.  Peritoneal cancers.  Results of the assessment will determine the need for genetic counseling and BRCA1 and BRCA2 testing. Cervical Cancer Your health care provider may recommend that you be screened regularly for cancer of the pelvic organs (ovaries, uterus, and vagina). This screening involves a pelvic examination, including checking for microscopic changes to the surface of your cervix (Pap test). You may be encouraged to have this screening done every 3 years, beginning at age 63.  For women ages 63-63, health care providers may recommend pelvic exams and Pap testing every 3 years, or they may recommend the Pap and pelvic exam, combined with testing for human papilloma virus (HPV), every 5 years. Some types of HPV increase your risk of cervical cancer. Testing for HPV may also be done on women of any age with unclear Pap test results.  Other health care providers may not recommend any screening for nonpregnant women who are considered low risk for pelvic cancer and who do not have symptoms. Ask your health care provider if a screening pelvic exam is right for you.  If you have had past treatment for cervical cancer or a condition that could lead to cancer, you need Pap tests and screening for cancer for at least 20 years after your treatment. If Pap tests have been discontinued, your risk factors (such as having a new sexual partner) need to be reassessed to determine if screening should resume. Some women have medical problems that increase the chance of getting cervical cancer. In these cases, your health care provider may recommend more frequent screening and Pap tests. Colorectal Cancer  This type of cancer can be detected and often prevented.  Routine colorectal cancer screening usually begins at 63 years of age and continues through 63 years of  age.  Your health care provider may recommend screening at an earlier age if you have risk factors for colon cancer.  Your health care provider may also recommend using home test kits to check for hidden blood in the stool.  A small camera at the end of a tube can be used to examine your colon directly (sigmoidoscopy or colonoscopy). This is done to check for the earliest forms of colorectal cancer.  Routine screening usually begins at age 66.  Direct examination of the colon should be repeated every 5-10 years through 63 years of age. However, you may need to be screened more often if early forms of precancerous polyps or small growths are found. Skin Cancer  Check your skin from head to toe regularly.  Tell your health care provider about any new moles or changes in moles, especially if there is a change in a mole's shape or color.  Also tell your health care provider if you have a  mole that is larger than the size of a pencil eraser.  Always use sunscreen. Apply sunscreen liberally and repeatedly throughout the day.  Protect yourself by wearing long sleeves, pants, a wide-brimmed hat, and sunglasses whenever you are outside. HEART DISEASE, DIABETES, AND HIGH BLOOD PRESSURE   High blood pressure causes heart disease and increases the risk of stroke. High blood pressure is more likely to develop in:  People who have blood pressure in the high end of the normal range (130-139/85-89 mm Hg).  People who are overweight or obese.  People who are African American.  If you are 106-36 years of age, have your blood pressure checked every 3-5 years. If you are 17 years of age or older, have your blood pressure checked every year. You should have your blood pressure measured twice--once when you are at a hospital or clinic, and once when you are not at a hospital or clinic. Record the average of the two measurements. To check your blood pressure when you are not at a hospital or clinic, you can  use:  An automated blood pressure machine at a pharmacy.  A home blood pressure monitor.  If you are between 79 years and 93 years old, ask your health care provider if you should take aspirin to prevent strokes.  Have regular diabetes screenings. This involves taking a blood sample to check your fasting blood sugar level.  If you are at a normal weight and have a low risk for diabetes, have this test once every three years after 63 years of age.  If you are overweight and have a high risk for diabetes, consider being tested at a younger age or more often. PREVENTING INFECTION  Hepatitis B  If you have a higher risk for hepatitis B, you should be screened for this virus. You are considered at high risk for hepatitis B if:  You were born in a country where hepatitis B is common. Ask your health care provider which countries are considered high risk.  Your parents were born in a high-risk country, and you have not been immunized against hepatitis B (hepatitis B vaccine).  You have HIV or AIDS.  You use needles to inject street drugs.  You live with someone who has hepatitis B.  You have had sex with someone who has hepatitis B.  You get hemodialysis treatment.  You take certain medicines for conditions, including cancer, organ transplantation, and autoimmune conditions. Hepatitis C  Blood testing is recommended for:  Everyone born from [birth year] through 1964.  Anyone with known risk factors for hepatitis C. Sexually transmitted infections (STIs)  You should be screened for sexually transmitted infections (STIs) including gonorrhea and chlamydia if:  You are sexually active and are younger than 63 years of age.  You are older than 63 years of age and your health care provider tells you that you are at risk for this type of infection.  Your sexual activity has changed since you were last screened and you are at an increased risk for chlamydia or gonorrhea. Ask your health care  provider if you are at risk.  If you do not have HIV, but are at risk, it may be recommended that you take a prescription medicine daily to prevent HIV infection. This is called pre-exposure prophylaxis (PrEP). You are considered at risk if:  You are sexually active and do not regularly use condoms or know the HIV status of your partner(s).  You take drugs by injection.  You are  sexually active with a partner who has HIV. Talk with your health care provider about whether you are at high risk of being infected with HIV. If you choose to begin PrEP, you should first be tested for HIV. You should then be tested every 3 months for as long as you are taking PrEP.  PREGNANCY   If you are premenopausal and you may become pregnant, ask your health care provider about preconception counseling.  If you may become pregnant, take 400 to 800 micrograms (mcg) of folic acid every day.  If you want to prevent pregnancy, talk to your health care provider about birth control (contraception). OSTEOPOROSIS AND MENOPAUSE   Osteoporosis is a disease in which the bones lose minerals and strength with aging. This can result in serious bone fractures. Your risk for osteoporosis can be identified using a bone density scan.  If you are 105 years of age or older, or if you are at risk for osteoporosis and fractures, ask your health care provider if you should be screened.  Ask your health care provider whether you should take a calcium or vitamin D supplement to lower your risk for osteoporosis.  Menopause may have certain physical symptoms and risks.  Hormone replacement therapy may reduce some of these symptoms and risks. Talk to your health care provider about whether hormone replacement therapy is right for you.  HOME CARE INSTRUCTIONS   Schedule regular health, dental, and eye exams.  Stay current with your immunizations.   Do not use any tobacco products including cigarettes, chewing tobacco, or  electronic cigarettes.  If you are pregnant, do not drink alcohol.  If you are breastfeeding, limit how much and how often you drink alcohol.  Limit alcohol intake to no more than 1 drink per day for nonpregnant women. One drink equals 12 ounces of beer, 5 ounces of wine, or 1 ounces of hard liquor.  Do not use street drugs.  Do not share needles.  Ask your health care provider for help if you need support or information about quitting drugs.  Tell your health care provider if you often feel depressed.  Tell your health care provider if you have ever been abused or do not feel safe at home.   This information is not intended to replace advice given to you by your health care provider. Make sure you discuss any questions you have with your health care provider.   Document Released: 12/16/2010 Document Revised: 06/23/2014 Document Reviewed: 05/04/2013 Elsevier Interactive Patient Education [employer].

## 2015-05-23 ENCOUNTER — Encounter: Payer: Self-pay | Admitting: Endocrinology

## 2015-05-23 ENCOUNTER — Ambulatory Visit (INDEPENDENT_AMBULATORY_CARE_PROVIDER_SITE_OTHER): Payer: BLUE CROSS/BLUE SHIELD | Admitting: Endocrinology

## 2015-05-23 VITALS — BP 132/64 | HR 82 | Temp 98.8°F | Resp 14 | Ht 63.0 in | Wt 163.4 lb

## 2015-05-23 DIAGNOSIS — Z794 Long term (current) use of insulin: Secondary | ICD-10-CM | POA: Diagnosis not present

## 2015-05-23 DIAGNOSIS — E1165 Type 2 diabetes mellitus with hyperglycemia: Secondary | ICD-10-CM

## 2015-05-23 DIAGNOSIS — I10 Essential (primary) hypertension: Secondary | ICD-10-CM | POA: Diagnosis not present

## 2015-05-23 MED ORDER — CANAGLIFLOZIN 100 MG PO TABS: ORAL_TABLET | ORAL | Status: DC

## 2015-05-23 NOTE — Patient Instructions (Addendum)
Check blood sugars on waking up 6-7  times a week Also check blood sugars about 2 hours after a meal and do this after different meals by rotation  Recommended blood sugar levels on waking up is 90-130 and about 2 hours after meal is 130-160  Please bring your blood sugar monitor to each visit, thank you  INVOKANA in am   Lantus 50 and adjust as directed  If  Supper reading still > 150 add 2 more Novolog   Tale 1/2 BP pill

## 2015-05-23 NOTE — Progress Notes (Addendum)
Patient ID: Kimberly [name], female   DOB: 03/30/1951, 64 y.o.   MRN: [id number]           Reason for Appointment: Consultation for Type 2 Diabetes  History of Present Illness:          Date of diagnosis of type 2 diabetes mellitus: 1995        Background history:   For several years she had been tried on oral agents with variable control.  Detailed history not available She thinks that she had nausea and vomiting with metformin and could not take much Also had been tried on Amaryl She thinks she had swelling with Januvia and not clear what other medications she has tried before  Recent history:     She was started on insulin in 07/2013 when her A1c was over 11%  Initially started on Levemir but subsequently put on Lantus.  Also this year has been started on mealtime Novolog with her main meal She thinks her blood sugars have been progressively higher over the last year  INSULIN regimen is: 46 Lantus hs, Novolog 6 units ACS      Current blood sugar patterns, management and problems identified:  Her blood sugars are mostly in the low 200 range throughout the day with only some fluctuation especially in the afternoon    Even though her Lantus was increased by her PCP in 11/16 her blood sugar control is still poor  She is generally trying to eat relatively low carbohydrate meals with no white bread or rice and usually some protein at lunch and dinner.  Generally not eating breakfast  She is usually eating a very light evening meal with generally carbohydrates  she is not checking blood sugars after evening meal   She thinks her blood sugar goes up when she drinks coffee in the morning  More recently her blood sugars have been higher because she thinks she cannot exercise as much  Non-insulin hypoglycemic drugs the patient is taking are: none     Side effects from medications have been: Nausea from metformin,?  Swelling from Januvia  Compliance with the medical regimen:  Good Hypoglycemia: none    Glucose monitoring:  done up to 3 times a day         Glucometer:        Blood Glucose readings by time of day and averages from meter download:  PREMEAL Breakfast Lunch Dinner Bedtime  Overall   Glucose range:  175-287 225-271  194-345     Median: 225 240 240  234   Self-care: The diet that the patient has been following is: tries to limit fats, bread.     Meal times are:  Lunch: 1 pm Dinner:   Typical meal intake: Breakfast, none               Dietician visit, most recent: none               Exercise:  1/7 days  Weight history:  Wt Readings from Last 3 Encounters:  05/23/15 163 lb 6.4 oz (74.118 kg)  05/01/15 162 lb 1.6 oz (73.528 kg)  02/07/15 160 lb (72.576 kg)    Glycemic control:   Lab Results  Component Value Date   HGBA1C 9.1* 04/24/2015   HGBA1C 7.6* 11/01/2014   HGBA1C 8.7* 08/04/2014   Lab Results  Component Value Date   MICROALBUR 0.9 04/24/2015   LDLCALC 72 11/01/2014   CREATININE 0.69 04/24/2015  Medication List       This list is accurate as of: 05/23/15  4:38 PM.  Always use your most recent med list.               albuterol 108 (90 BASE) MCG/ACT inhaler  Commonly known as:  PROVENTIL HFA;VENTOLIN HFA  Inhale 2 puffs into the lungs every 6 (six) hours as needed. For shortness of breath or wheezing     aspirin 325 MG EC tablet  Take 325 mg by mouth every morning.     atorvastatin 80 MG tablet  Commonly known as:  LIPITOR  TAKE 1 TABLET (80 MG TOTAL) BY MOUTH DAILY.     BAYER CONTOUR NEXT TEST test strip  Generic drug:  glucose blood  TEST SUGAR UP TO 3 TO 4 TIMES A DAY TO TEST BLOOD SUGAR     canagliflozin 100 MG Tabs tablet  Commonly known as:  INVOKANA  1 tablet before breakfast     ESTROVEN NIGHTTIME PO  Take by mouth.     Insulin Glargine 100 UNIT/ML Solostar Pen  Commonly known as:  LANTUS  Inject 40 Units into the skin at bedtime. Increase to 46 units     Insulin Pen Needle 32G X 4 MM  Misc  Commonly known as:  BD PEN NEEDLE NANO U/F  USE WITH LEVEMIR FLEX PEN     lisinopril-hydrochlorothiazide 20-12.5 MG tablet  Commonly known as:  PRINZIDE,ZESTORETIC  TAKE 1 TABLET BY MOUTH DAILY.     NOVOLOG FLEXPEN 100 UNIT/ML FlexPen  Generic drug:  insulin aspart  INJECT 6 UNITS INTO THE SKIN FOR 1 DOSE. WITH LARGEST MEAL OR AS DIRECTED        Allergies:  Allergies  Allergen Reactions  . Metformin And Related Nausea And Vomiting    After one pill at hospital  . Januvia [Sitagliptin Phosphate] Other (See Comments)    Swelling and edema see 4 13 OV    Past Medical History  Diagnosis Date  . CAD (coronary artery disease)   . DM (diabetes mellitus) ([place])   . HTN (hypertension)   . Glaucoma   . PVD (peripheral vascular disease) ([place])   . Osteoporosis   . COPD (chronic obstructive pulmonary disease) ([place])   . History of ventricular fibrillation   . Glaucoma   . Colitis   . Myocardial infarction ([place])     12/1998, 01/2011  . Diverticulitis 02/10/2012  . Colitis, indeterminate 01/31/2012    Past Surgical History  Procedure Laterality Date  . Cabg x 3  01/22/2011    Dr Cyndia Bent  . Cardiac catheterization  01/17/2011    Dr Arvella Merles  . S/p myocardial infarction and pci of the lt circumflex  2000  . S/p aortobifemoral bypass  2008    Dr Donnetta Hutching  . Abdominal hysterectomy      endometriosis  . Lipoma excision Left 02/08/2015    Procedure: EXCISION OF LEFT THIGH LIPOMA;  Surgeon: Greer Pickerel, MD;  Location: [facility];  Service: General;  Laterality: Left;    Family History  Problem Relation Age of Onset  . Heart attack Father   . Diabetes Father   . Heart disease Father   . Diabetes Mother   . Liver cancer Mother   . Hypertension    . Thyroid disease      sisters- sister had parathyroidectomy  . Alcohol abuse      brother  . Colon polyps Maternal Uncle   . Breast cancer Sister   .  Diabetes Sister   . Diabetes Brother     Social History:  reports  that she has been smoking Cigarettes.  She has been smoking about 0.40 packs per day. She has never used smokeless tobacco. She reports that she does not drink alcohol or use illicit drugs.    Review of Systems    Lipid history:     Lab Results  Component Value Date   CHOL 148 04/24/2015   HDL 25.60* 04/24/2015   LDLCALC 72 11/01/2014   LDLDIRECT 87.0 04/24/2015   TRIG 213.0* 04/24/2015   CHOLHDL 6 04/24/2015           Hypertension: Present for several years  Most recent eye exam was normal in 9/16  Most recent foot exam: 12/16  Review of Systems  Constitutional: Negative for weight loss and malaise.  HENT: Negative for headaches.   Eyes: Negative for blurred vision.  Respiratory: Negative for shortness of breath.   Cardiovascular: Negative for palpitations, leg swelling and claudication.  Endocrine: Negative for fatigue and cold intolerance.       Has had hot flashes and sweating, treated with OTC supplement  Musculoskeletal: Negative for joint pain and back pain.  Skin: Negative for rash.       Has had some dry skin  Neurological: Negative for numbness and tingling.  Psychiatric/Behavioral: Negative for insomnia.      LABS:  No visits with results within 1 Week(s) from this visit. Latest known visit with results is:  Lab on 04/24/2015  Component Date Value Ref Range Status  . Sodium 04/24/2015 138  135 - 145 mEq/L Final  . Potassium 04/24/2015 4.9  3.5 - 5.1 mEq/L Final  . Chloride 04/24/2015 101  96 - 112 mEq/L Final  . CO2 04/24/2015 29  19 - 32 mEq/L Final  . Glucose, Bld 04/24/2015 243* 70 - 99 mg/dL Final  . BUN 04/24/2015 14  6 - 23 mg/dL Final  . Creatinine, Ser 04/24/2015 0.69  0.40 - 1.20 mg/dL Final  . Calcium 04/24/2015 10.4  8.4 - 10.5 mg/dL Final  . GFR 04/24/2015 91.23  >60.00 mL/min Final  . WBC 04/24/2015 6.3  4.0 - 10.5 K/uL Final  . RBC 04/24/2015 5.29* 3.87 - 5.11 Mil/uL Final  . Hemoglobin 04/24/2015 16.0* 12.0 - 15.0 g/dL Final  . HCT  04/24/2015 47.3* 36.0 - 46.0 % Final  . MCV 04/24/2015 89.5  78.0 - 100.0 fl Final  . MCHC 04/24/2015 33.7  30.0 - 36.0 g/dL Final  . RDW 04/24/2015 14.0  11.5 - 15.5 % Final  . Platelets 04/24/2015 141.0* 150.0 - 400.0 K/uL Final  . Neutrophils Relative % 04/24/2015 62.9  43.0 - 77.0 % Final  . Lymphocytes Relative 04/24/2015 26.9  12.0 - 46.0 % Final  . Monocytes Relative 04/24/2015 6.6  3.0 - 12.0 % Final  . Eosinophils Relative 04/24/2015 3.2  0.0 - 5.0 % Final  . Basophils Relative 04/24/2015 0.4  0.0 - 3.0 % Final  . Neutro Abs 04/24/2015 3.9  1.4 - 7.7 K/uL Final  . Lymphs Abs 04/24/2015 1.7  0.7 - 4.0 K/uL Final  . Monocytes Absolute 04/24/2015 0.4  0.1 - 1.0 K/uL Final  . Eosinophils Absolute 04/24/2015 0.2  0.0 - 0.7 K/uL Final  . Basophils Absolute 04/24/2015 0.0  0.0 - 0.1 K/uL Final  . Total Bilirubin 04/24/2015 0.6  0.2 - 1.2 mg/dL Final  . Bilirubin, Direct 04/24/2015 0.1  0.0 - 0.3 mg/dL Final  . Alkaline Phosphatase 04/24/2015  90  39 - 117 U/L Final  . AST 04/24/2015 17  0 - 37 U/L Final  . ALT 04/24/2015 21  0 - 35 U/L Final  . Total Protein 04/24/2015 7.2  6.0 - 8.3 g/dL Final  . Albumin 04/24/2015 4.2  3.5 - 5.2 g/dL Final  . Hgb A1c MFr Bld 04/24/2015 9.1* 4.6 - 6.5 % Final   Glycemic Control Guidelines for People with Diabetes:Non Diabetic:  <6%Goal of Therapy: <7%Additional Action Suggested:  >8%   . TSH 04/24/2015 3.30  0.35 - 4.50 uIU/mL Final  . Microalb, Ur 04/24/2015 0.9  0.0 - 1.9 mg/dL Final  . Creatinine,U 04/24/2015 62.9   Final  . Microalb Creat Ratio 04/24/2015 1.4  0.0 - 30.0 mg/g Final  . Cholesterol 04/24/2015 148  0 - 200 mg/dL Final   ATP III Classification       Desirable:  < 200 mg/dL               Borderline High:  200 - 239 mg/dL          High:  > = 240 mg/dL  . Triglycerides 04/24/2015 213.0* 0.0 - 149.0 mg/dL Final   Normal:  <150 mg/dLBorderline High:  150 - 199 mg/dL  . HDL 04/24/2015 25.60* >39.00 mg/dL Final  . VLDL 04/24/2015 42.6*  0.0 - 40.0 mg/dL Final  . Total CHOL/HDL Ratio 04/24/2015 6   Final                  Men          Women1/2 Average Risk     3.4          3.3Average Risk          5.0          4.42X Average Risk          9.6          7.13X Average Risk          15.0          11.0                      . NonHDL 04/24/2015 122.42   Final   NOTE:  Non-HDL goal should be 30 mg/dL higher than patient's LDL goal (i.e. LDL goal of < 70 mg/dL, would have non-HDL goal of < 100 mg/dL)  . Direct LDL 04/24/2015 87.0   Final   Optimal:  <100 mg/dLNear or Above Optimal:  100-129 mg/dLBorderline High:  130-159 mg/dLHigh:  160-189 mg/dLVery High:  >190 mg/dL    Physical Examination:  BP 132/64 mmHg  Pulse 82  Temp(Src) 98.8 F (37.1 C) (Oral)  Resp 14  Ht 5\' 3"  (1.6 m)  Wt 163 lb 6.4 oz (74.118 kg)  BMI 28.95 kg/m2  SpO2 97%  GENERAL:      averagely built and nourished, mild obesity      HEENT:         Eye exam shows normal external appearance. Fundus exam shows no retinopathy. Oral exam shows normal mucosa .  NECK:   There is no lymphadenopathy Thyroid is not enlarged and no nodules felt.  Carotids are normal to palpation and no bruit heard LUNGS:         Chest is symmetrical. Lungs are clear to auscultation.Marland Kitchen   HEART:         Heart sounds:  S1 and S2 are normal.  2/6 ejection murmur present at the base;  no S3 or S4.   ABDOMEN:   There is no distention present. Liver and spleen are not palpable. No other mass or tenderness present.   NEUROLOGICAL:   Vibration sense is moderately  reduced in distal first toes. Ankle jerks are absent bilaterally.          Diabetic foot exam shows normal monofilament sensation in the toes and plantar surfaces, no skin lesions or ulcers on the feet and normal pedal pulses  MUSCULOSKELETAL:  There is no swelling or deformity of the peripheral joints. Spine is normal to inspection.   EXTREMITIES:     There is no edema. No skin lesions present.Marland Kitchen SKIN:       No rash or lesions of concern.         ASSESSMENT:  Diabetes type 2, uncontrolled  Her last A1c was significantly higher at 9.1% Currently only on a basal bolus insulin regimen with blood sugars averaging over 200 at home and consistently high throughout the day  She is appearing insulin resistant also despite BMI of under 30 Overall appears to be doing very well with watching her diet and avoiding high fat and high carbohydrate meals and generally eating only one significant meal a day Currently not doing much exercise however.  Discussed additional medications such as Invokana which can benefit her control in addition to increasing her insulin Discussed action of SGLT 2 drugs on lowering glucose by decreasing kidney absorption of glucose, benefits of weight loss and lower blood pressure, possible side effects including candidiasis and dosage regimen   Complications: None evident  HYPERTENSION: Appears well-controlled  HYPERLIPIDEMIA: LDL is 87 with Lipitor 80 mg but triglycerides still slightly high, likely to be from inadequate diabetes control despite a good diet. With her history of coronary artery disease will benefit from further reduction in LDL, consider Crestor instead  PLAN:    Invokana 100 mg daily in the morning  Increase Lantus by at least 4 units.  She will start adjusting this by 2-4 units every 3 days based on blood sugar patterns in the morning.  She will increase the dose by 2 units if blood sugars over 130 consistently and reduce by 4 units if under 100  Continue same dose of Novolog unless afternoon blood sugars continue to be out of range, discussed postprandial targets  Blood sugars to be checked after supper also  Reduce Zestoretic to half tablet while starting Invokana  Make sure she has good fluid intake with starting Invokana  Increase walking as tolerated  Follow-up in 3 weeks  Patient Instructions  Check blood sugars on waking up 6-7  times a week Also check blood sugars about 2  hours after a meal and do this after different meals by rotation  Recommended blood sugar levels on waking up is 90-130 and about 2 hours after meal is 130-160  Please bring your blood sugar monitor to each visit, thank you  INVOKANA in am   Lantus 50 and adjust as directed  If  Supper reading still > 150 add 2 more Novolog   Tale 1/2 BP pill    Bobi Daudelin 05/23/2015, 4:38 PM   Note: This office note was prepared with Estate agent. Any transcriptional errors that result from this process are unintentional.

## 2015-06-05 ENCOUNTER — Other Ambulatory Visit (INDEPENDENT_AMBULATORY_CARE_PROVIDER_SITE_OTHER): Payer: BLUE CROSS/BLUE SHIELD

## 2015-06-05 DIAGNOSIS — Z794 Long term (current) use of insulin: Secondary | ICD-10-CM

## 2015-06-05 DIAGNOSIS — E1165 Type 2 diabetes mellitus with hyperglycemia: Secondary | ICD-10-CM

## 2015-06-05 LAB — BASIC METABOLIC PANEL
BUN: 23 mg/dL (ref 6–23)
CHLORIDE: 101 meq/L (ref 96–112)
CO2: 28 mEq/L (ref 19–32)
CREATININE: 0.84 mg/dL (ref 0.40–1.20)
Calcium: 10.9 mg/dL — ABNORMAL HIGH (ref 8.4–10.5)
GFR: 72.68 mL/min (ref >60.00)
Glucose, Bld: 297 mg/dL — ABNORMAL HIGH (ref 70–99)
POTASSIUM: 5.1 meq/L (ref 3.5–5.1)
Sodium: 139 mEq/L (ref 135–145)

## 2015-06-08 ENCOUNTER — Encounter: Payer: Self-pay | Admitting: Endocrinology

## 2015-06-08 ENCOUNTER — Ambulatory Visit (INDEPENDENT_AMBULATORY_CARE_PROVIDER_SITE_OTHER): Payer: BLUE CROSS/BLUE SHIELD | Admitting: Endocrinology

## 2015-06-08 VITALS — BP 122/60 | HR 82 | Temp 98.1°F | Resp 14 | Ht 63.0 in | Wt 160.2 lb

## 2015-06-08 DIAGNOSIS — Z794 Long term (current) use of insulin: Secondary | ICD-10-CM

## 2015-06-08 DIAGNOSIS — E1165 Type 2 diabetes mellitus with hyperglycemia: Secondary | ICD-10-CM

## 2015-06-08 DIAGNOSIS — I10 Essential (primary) hypertension: Secondary | ICD-10-CM | POA: Diagnosis not present

## 2015-06-08 MED ORDER — INSULIN ASPART 100 UNIT/ML FLEXPEN: PEN_INJECTOR | SUBCUTANEOUS | Status: DC

## 2015-06-08 NOTE — Patient Instructions (Signed)
Check blood sugars on waking up 2-3  times a week Also check blood sugars about 2 hours after a meal and do this after different meals by rotation  Recommended blood sugar levels on waking up is 90-130 and about 2 hours after meal is 130-160  Please bring your blood sugar monitor to each visit, thank you  Reduce V-8 to 1 can if high potassium  10 Novolog at breakfast   Check blood sugars on waking up   times a week Also check blood sugars about 2 hours after a meal and do this after different meals by rotation  Recommended blood sugar levels on waking up is 90-130 and about 2 hours after meal is 130-160  Please bring your blood sugar monitor to each visit, thank you  Lantus same unless sugar out of am range  INDOOR EXERCISE IDEAS   Use the following examples for a creative indoor workout (perform each move for 2-3 minutes):   Warm up. Put on some music that makes you feel like moving, and dance around the living room.  Watch exercise shows on TV and move along with them. There are tons of free cable channels that have daily exercise shows on them for all levels - beginner through advanced.   You can easily find a number of exercise videos but use one that will suit your liking and exercise level; you can do these on your own schedule.  Walk up and down the steps.  Do dumbbell curls and presses (if you don't have weights, use full water bottles).  Do assisted squats, keeping your back on a fitness ball against the [place] or using the back of the couch for support.  Shadow box: Lift and lower the left leg; jab with the right arm, then the left; then lift and lower the right leg.  Fence (you don't even need swords). Pretend you're holding a sword in each hand. Create an X pattern standing still, then moving forward and back.  Hop on your exercise bike or treadmill -- or, for something different, use a weighted hula hoop. If you don't have any of those, just go back to  dancing.  Do abdominal crunches (hold a weighted ball for added resistance).  Cool down with [employer] "I Feel Good" -- or whatever tune makes you feel good

## 2015-06-08 NOTE — Progress Notes (Signed)
Patient ID: Kimberly [name], female   DOB: May 23, 1951, 65 y.o.   MRN: [id number]           Reason for Appointment: follow-up for Type 2 Diabetes  History of Present Illness:          Date of diagnosis of type 2 diabetes mellitus: 1995        Background history:   For several years she had been tried on oral agents with variable control.  Detailed history not available She thinks that she had nausea and vomiting with metformin and could not take much Also had been tried on Amaryl She thinks she had swelling with Januvia and not clear what other medications she has tried before  Recent history:   INSULIN regimen is: 46 Lantus hs   Novolog 20 units before lunch      She was started on insulin in 07/2013 when her A1c was over 11%  Initially started on Levemir but subsequently put on Lantus.   Also in 2016 was started on mealtime Novolog with her main meal Because of her poor control and A1c of 9.1 she was started on Invokana 100 mg daily in 12/16 Her average blood sugar prior to starting this was 234 at home  Current blood sugar patterns, management and problems identified:  Her blood sugars in the morning are excellent now and came down fairly quickly after starting Invokana  She has checked readings at times after lunch and these are fairly good also except once when she forgot her mealtime insulin  Her glucose in the lab was 297 after eating a small breakfast which is usually some cereal, V8 juice and coffee  She was told to increase Lantus to 50 but when her glucose came down to 79 she reduced it back to 46  No hypoglycemia during the day  Has only one or 2 readings after supper which are not significantly high, usually has a small meal in the evening  She was advised to increase exercise but she is walking only on Sundays  Average glucose for the last 4 weeks is 170 but much improved after starting Invokana 2 weeks ago  Non-insulin hypoglycemic drugs the patient is taking  are:  Invokana 100 mg daily      Side effects from medications have been: Nausea from metformin,?  Swelling from Januvia  Compliance with the medical regimen: Good Hypoglycemia: none    Glucose monitoring:  done up to 3 times a day         Glucometer:  Contour       Blood Glucose readings by time of day and averages from meter download recently:  Mean values apply above for all meters except median for One Touch  PRE-MEAL Fasting Lunch Dinner  PCS  Overall  Glucose range: 79-136    98-165   169    Mean/median:         Self-care: The diet that the patient has been following is: tries to limit fats, bread.     Meal times are:  Lunch: 1 pm Dinner: 6 PM  Typical meal intake: Breakfast, minimal as above               Dietician visit, most recent: none               Exercise:  1/7 days a week  Weight history:  Wt Readings from Last 3 Encounters:  06/08/15 160 lb 3.2 oz (72.666 kg)  05/23/15 163 lb 6.4  oz (74.118 kg)  05/01/15 162 lb 1.6 oz (73.528 kg)    Glycemic control:   Lab Results  Component Value Date   HGBA1C 9.1* 04/24/2015   HGBA1C 7.6* 11/01/2014   HGBA1C 8.7* 08/04/2014   Lab Results  Component Value Date   MICROALBUR 0.9 04/24/2015   LDLCALC 72 11/01/2014   CREATININE 0.84 06/05/2015         Medication List       This list is accurate as of: 06/08/15  8:34 AM.  Always use your most recent med list.               albuterol 108 (90 BASE) MCG/ACT inhaler  Commonly known as:  PROVENTIL HFA;VENTOLIN HFA  Inhale 2 puffs into the lungs every 6 (six) hours as needed. For shortness of breath or wheezing     aspirin 325 MG EC tablet  Take 325 mg by mouth every morning.     atorvastatin 80 MG tablet  Commonly known as:  LIPITOR  TAKE 1 TABLET (80 MG TOTAL) BY MOUTH DAILY.     BAYER CONTOUR NEXT TEST test strip  Generic drug:  glucose blood  TEST SUGAR UP TO 3 TO 4 TIMES A DAY TO TEST BLOOD SUGAR     canagliflozin 100 MG Tabs tablet  Commonly known  as:  INVOKANA  1 tablet before breakfast     ESTROVEN NIGHTTIME PO  Take by mouth.     insulin aspart 100 UNIT/ML FlexPen  Commonly known as:  NOVOLOG FLEXPEN  INJECT 20 UNITS INTO THE SKIN FOR 1 DOSE. WITH LARGEST MEAL OR AS DIRECTED     Insulin Glargine 100 UNIT/ML Solostar Pen  Commonly known as:  LANTUS  Inject 40 Units into the skin at bedtime. Increase to 46 units     Insulin Pen Needle 32G X 4 MM Misc  Commonly known as:  BD PEN NEEDLE NANO U/F  USE WITH LEVEMIR FLEX PEN     lisinopril-hydrochlorothiazide 20-12.5 MG tablet  Commonly known as:  PRINZIDE,ZESTORETIC  TAKE 1 TABLET BY MOUTH DAILY.        Allergies:  Allergies  Allergen Reactions  . Metformin And Related Nausea And Vomiting    After one pill at hospital  . Januvia [Sitagliptin Phosphate] Other (See Comments)    Swelling and edema see 4 13 OV    Past Medical History  Diagnosis Date  . CAD (coronary artery disease)   . DM (diabetes mellitus) ([place])   . HTN (hypertension)   . Glaucoma   . PVD (peripheral vascular disease) ([place])   . Osteoporosis   . COPD (chronic obstructive pulmonary disease) ([place])   . History of ventricular fibrillation   . Glaucoma   . Colitis   . Myocardial infarction ([place])     12/1998, 01/2011  . Diverticulitis 02/10/2012  . Colitis, indeterminate 01/31/2012    Past Surgical History  Procedure Laterality Date  . Cabg x 3  01/22/2011    Dr Cyndia Bent  . Cardiac catheterization  01/17/2011    Dr Arvella Merles  . S/p myocardial infarction and pci of the lt circumflex  2000  . S/p aortobifemoral bypass  2008    Dr Donnetta Hutching  . Abdominal hysterectomy      endometriosis  . Lipoma excision Left 02/08/2015    Procedure: EXCISION OF LEFT THIGH LIPOMA;  Surgeon: Greer Pickerel, MD;  Location: [place];  Service: General;  Laterality: Left;    Family History  Problem Relation  Age of Onset  . Heart attack Father   . Diabetes Father   . Heart disease Father   . Diabetes Mother   .  Liver cancer Mother   . Hypertension    . Thyroid disease      sisters- sister had parathyroidectomy  . Alcohol abuse      brother  . Colon polyps Maternal Uncle   . Breast cancer Sister   . Diabetes Sister   . Diabetes Brother     Social History:  reports that she has been smoking Cigarettes.  She has been smoking about 0.40 packs per day. She has never used smokeless tobacco. She reports that she does not drink alcohol or use illicit drugs.    Review of Systems    Lipid history:     Lab Results  Component Value Date   CHOL 148 04/24/2015   HDL 25.60* 04/24/2015   LDLCALC 72 11/01/2014   LDLDIRECT 87.0 04/24/2015   TRIG 213.0* 04/24/2015   CHOLHDL 6 04/24/2015           Hypertension: Present for several years, checking blood pressure at home recently Zestoretic  was reduced to half a tablet with starting Invokana  She has had hypercalcemia since about 2010 PTH levels in the mid-upper range She does not take any calcium or vitamin D She carries a diagnosis of osteoporosis and her record but no bone density records are available No history of fractures  Most recent eye exam was normal in 9/16  Most recent foot exam: 12/16  Review of Systems    LABS:  Lab on 06/05/2015  Component Date Value Ref Range Status  . Sodium 06/05/2015 139  135 - 145 mEq/L Final  . Potassium 06/05/2015 5.1  3.5 - 5.1 mEq/L Final  . Chloride 06/05/2015 101  96 - 112 mEq/L Final  . CO2 06/05/2015 28  19 - 32 mEq/L Final  . Glucose, Bld 06/05/2015 297* 70 - 99 mg/dL Final  . BUN 06/05/2015 23  6 - 23 mg/dL Final  . Creatinine, Ser 06/05/2015 0.84  0.40 - 1.20 mg/dL Final  . Calcium 06/05/2015 10.9* 8.4 - 10.5 mg/dL Final  . GFR 06/05/2015 72.68  >60.00 mL/min Final    Physical Examination:  BP 122/60 mmHg  Pulse 82  Temp(Src) 98.1 F (36.7 C) (Oral)  Resp 14  Ht 5\' 3"  (1.6 m)  Wt 160 lb 3.2 oz (72.666 kg)  BMI 28.39 kg/m2  SpO2 96%   No edema present     ASSESSMENT:  Diabetes type 2, uncontrolled  See history of present illness for detailed discussion of his current management, blood sugar patterns and problems identified  Her blood sugars at home are dramatically better with adding Invokana 100 mg daily and fasting blood sugars are nearly normal, previously had been over 200 with the same dose of Lantus However not clear if she is having post prandial hyperglycemia as she is checking readings mostly fasting Her glucose was markedly increased after her breakfast probably because of eating carbohydrate and drinking juice and coffee.  HYPERCALCEMIA: Likely to be hyperparathyroidism and has been long-standing, no recent bone density report available  HYPERTENSION: Appears well-controlled even with reducing her Zestoretic to half tablet   PLAN:    Continue Invokana 100 mg daily in the morning  Start taking Novolog 10 units at [facility] and adjust dose further if blood sugars midmorning are high  Start checking more readings after lunch and supper, may need some insulin  at suppertime if blood sugars are consistently high  A1c to be checked on the next visit  Given ideas about indoor exercises  Check bone density to rule out osteoporosis related to long-standing hyperparathyroidism  Monitor potassium periodically, consider changing lisinopril to losartan   Continue monitoring blood pressure periodically at home  Follow-up in 6 weeks  Counseling time on subjects discussed above is over 50% of today's 25 minute visit  Patient Instructions  Check blood sugars on waking up 2-3  times a week Also check blood sugars about 2 hours after a meal and do this after different meals by rotation  Recommended blood sugar levels on waking up is 90-130 and about 2 hours after meal is 130-160  Please bring your blood sugar monitor to each visit, thank you  Reduce V-8 to 1 can if high potassium  10 Novolog at breakfast   Check blood sugars  on waking up   times a week Also check blood sugars about 2 hours after a meal and do this after different meals by rotation  Recommended blood sugar levels on waking up is 90-130 and about 2 hours after meal is 130-160  Please bring your blood sugar monitor to each visit, thank you  Lantus same unless sugar out of am range  INDOOR EXERCISE IDEAS   Use the following examples for a creative indoor workout (perform each move for 2-3 minutes):   Warm up. Put on some music that makes you feel like moving, and dance around the living room.  Watch exercise shows on TV and move along with them. There are tons of free cable channels that have daily exercise shows on them for all levels - beginner through advanced.   You can easily find a number of exercise videos but use one that will suit your liking and exercise level; you can do these on your own schedule.  Walk up and down the steps.  Do dumbbell curls and presses (if you don't have weights, use full water bottles).  Do assisted squats, keeping your back on a fitness ball against the [place] or using the back of the couch for support.  Shadow box: Lift and lower the left leg; jab with the right arm, then the left; then lift and lower the right leg.  Fence (you don't even need swords). Pretend you're holding a sword in each hand. Create an X pattern standing still, then moving forward and back.  Hop on your exercise bike or treadmill -- or, for something different, use a weighted hula hoop. If you don't have any of those, just go back to dancing.  Do abdominal crunches (hold a weighted ball for added resistance).  Cool down with [employer] "I Feel Good" -- or whatever tune makes you feel good        Myrle Dues 06/08/2015, 8:34 AM   Note: This office note was prepared with Dragon voice recognition system technology. Any transcriptional errors that result from this process are unintentional.

## 2015-07-11 ENCOUNTER — Telehealth: Payer: Self-pay | Admitting: Internal Medicine

## 2015-07-11 MED ORDER — LISINOPRIL-HYDROCHLOROTHIAZIDE 20-12.5 MG PO TABS: 1.0000 | ORAL_TABLET | Freq: Every day | ORAL | Status: DC

## 2015-07-11 MED ORDER — GLUCOSE BLOOD VI STRP: ORAL_STRIP | Status: DC

## 2015-07-11 MED ORDER — ATORVASTATIN CALCIUM 80 MG PO TABS: ORAL_TABLET | ORAL | Status: DC

## 2015-07-11 NOTE — Telephone Encounter (Signed)
Sent to the pharmacy by e-scribe. 

## 2015-07-11 NOTE — Telephone Encounter (Signed)
Patient would like refill on the following medications:  1) Atorvastatin  2) Bayer Control Nex Test Strips  3) Lisinotril

## 2015-07-17 ENCOUNTER — Other Ambulatory Visit (INDEPENDENT_AMBULATORY_CARE_PROVIDER_SITE_OTHER): Payer: BLUE CROSS/BLUE SHIELD

## 2015-07-17 DIAGNOSIS — E1165 Type 2 diabetes mellitus with hyperglycemia: Secondary | ICD-10-CM | POA: Diagnosis not present

## 2015-07-17 DIAGNOSIS — Z794 Long term (current) use of insulin: Secondary | ICD-10-CM

## 2015-07-17 LAB — BASIC METABOLIC PANEL
BUN: 27 mg/dL — AB (ref 6–23)
CALCIUM: 10.3 mg/dL (ref 8.4–10.5)
CO2: 26 meq/L (ref 19–32)
Chloride: 103 mEq/L (ref 96–112)
Creatinine, Ser: 0.81 mg/dL (ref 0.40–1.20)
GFR: 75.76 mL/min (ref >60.00)
GLUCOSE: 120 mg/dL — AB (ref 70–99)
Potassium: 4.3 mEq/L (ref 3.5–5.1)
Sodium: 139 mEq/L (ref 135–145)

## 2015-07-17 LAB — LIPID PANEL
CHOL/HDL RATIO: 5
Cholesterol: 134 mg/dL (ref 0–200)
HDL: 25.3 mg/dL — ABNORMAL LOW (ref >39.00)
LDL Cholesterol: 69 mg/dL (ref 0–99)
NONHDL: 108.33
Triglycerides: 196 mg/dL — ABNORMAL HIGH (ref 0.0–149.0)
VLDL: 39.2 mg/dL (ref 0.0–40.0)

## 2015-07-17 LAB — TSH: TSH: 4.13 u[IU]/mL (ref 0.35–4.50)

## 2015-07-17 LAB — HEMOGLOBIN A1C: Hgb A1c MFr Bld: 6.6 % — ABNORMAL HIGH (ref 4.6–6.5)

## 2015-07-17 LAB — VITAMIN D 25 HYDROXY (VIT D DEFICIENCY, FRACTURES): VITD: 25.21 ng/mL — AB (ref 30.00–100.00)

## 2015-07-18 LAB — PARATHYROID HORMONE, INTACT (NO CA): PTH: 26 pg/mL (ref 15–65)

## 2015-07-20 ENCOUNTER — Encounter: Payer: Self-pay | Admitting: Endocrinology

## 2015-07-20 ENCOUNTER — Ambulatory Visit (INDEPENDENT_AMBULATORY_CARE_PROVIDER_SITE_OTHER): Payer: BLUE CROSS/BLUE SHIELD | Admitting: Endocrinology

## 2015-07-20 VITALS — BP 122/72 | HR 85 | Temp 97.8°F | Resp 14 | Ht 63.0 in | Wt 156.6 lb

## 2015-07-20 DIAGNOSIS — E1165 Type 2 diabetes mellitus with hyperglycemia: Secondary | ICD-10-CM

## 2015-07-20 DIAGNOSIS — Z794 Long term (current) use of insulin: Secondary | ICD-10-CM | POA: Diagnosis not present

## 2015-07-20 NOTE — Progress Notes (Signed)
Patient ID: Kimberly [name], female   DOB: May 15, 1951, 65 y.o.   MRN: [id number]           Reason for Appointment: follow-up for Type 2 Diabetes  History of Present Illness:          Date of diagnosis of type 2 diabetes mellitus: 1995        Background history:   For several years she had been tried on oral agents with variable control.  Detailed history not available She thinks that she had nausea and vomiting with metformin and could not take much Also had been tried on Amaryl She thinks she had swelling with Januvia and not clear what other medications she has tried before  Recent history:   INSULIN regimen is: 40 Lantus hs   Novolog 10--20 units before lunch     She was started on insulin in 07/2013 when her A1c was over 11%  Also in 2016 was started on mealtime Novolog with her main meal, this was increased to twice a day with her breakfast also Because of her poor control and A1c of 9.1 she was started on Invokana 100 mg daily in 12/16 Blood sugars improved significantly after adding Invokana 100 mg Her A1c is now down to 6.6  Current blood sugar patterns, management and problems identified:  Her blood sugars in the morning are excellent and she has on her own upper down her insulin dose to 40 units of Lantus instead of 46 about 2 weeks ago  She has however not checked readings consistently after meals  Has only sporadic high readings after breakfast or lunch and this is when she is eating more, 100  She thinks that she is not eating any carbohydrate usually at suppertime and does not check her sugars afterwards  She is trying to be more active and going to walk almost daily now  Non-insulin hypoglycemic drugs the patient is taking are:  Invokana 100 mg daily      Side effects from medications have been: Nausea from metformin,?  Swelling from Januvia  Compliance with the medical regimen: Good Hypoglycemia: Mild as above, none recently    Glucose monitoring:  done up to 3  times a day         Glucometer:  Contour       Blood Glucose readings by time of day and averages from meter download recently:  Mean values apply above for all meters except median for One Touch  PRE-MEAL Fasting Lunch  1 PM-6 PM   6-7 PM  Overall  Glucose range:  76-129    88-193   86-198    Mean/median:  110    121    112    POST-MEAL PC Breakfast PC Lunch PC Dinner  Glucose range:  191     Mean/median:        Self-care: The diet that the patient has been following is: tries to limit fats, bread.     Meal times are:  Lunch: 1 pm Dinner: 6 PM  Typical meal intake: Breakfast, minimal as above               Dietician visit, most recent: none               Exercise:  7/7 days a week  Weight history:  Wt Readings from Last 3 Encounters:  07/20/15 156 lb 9.6 oz (71.033 kg)  06/08/15 160 lb 3.2 oz (72.666 kg)  05/23/15 163 lb 6.4 oz (  74.118 kg)    Glycemic control:   Lab Results  Component Value Date   HGBA1C 6.6* 07/17/2015   HGBA1C 9.1* 04/24/2015   HGBA1C 7.6* 11/01/2014   Lab Results  Component Value Date   MICROALBUR 0.9 04/24/2015   LDLCALC 69 07/17/2015   CREATININE 0.81 07/17/2015         Medication List       This list is accurate as of: 07/20/15  9:39 AM.  Always use your most recent med list.               albuterol 108 (90 Base) MCG/ACT inhaler  Commonly known as:  PROVENTIL HFA;VENTOLIN HFA  Inhale 2 puffs into the lungs every 6 (six) hours as needed. For shortness of breath or wheezing     aspirin 325 MG EC tablet  Take 325 mg by mouth every morning.     atorvastatin 80 MG tablet  Commonly known as:  LIPITOR  TAKE 1 TABLET (80 MG TOTAL) BY MOUTH DAILY.     canagliflozin 100 MG Tabs tablet  Commonly known as:  INVOKANA  1 tablet before breakfast     ESTROVEN NIGHTTIME PO  Take by mouth.     glucose blood test strip  Commonly known as:  BAYER CONTOUR NEXT TEST  TEST SUGAR UP TO 3 TO 4 TIMES A DAY TO TEST BLOOD SUGAR     insulin  aspart 100 UNIT/ML FlexPen  Commonly known as:  NOVOLOG FLEXPEN  INJECT 20 UNITS INTO THE SKIN FOR 1 DOSE. WITH LARGEST MEAL OR AS DIRECTED     Insulin Glargine 100 UNIT/ML Solostar Pen  Commonly known as:  LANTUS  Inject 40 Units into the skin at bedtime. Increase to 46 units     Insulin Pen Needle 32G X 4 MM Misc  Commonly known as:  BD PEN NEEDLE NANO U/F  USE WITH LEVEMIR FLEX PEN     lisinopril-hydrochlorothiazide 20-12.5 MG tablet  Commonly known as:  PRINZIDE,ZESTORETIC  Take 1 tablet by mouth daily.        Allergies:  Allergies  Allergen Reactions  . Metformin And Related Nausea And Vomiting    After one pill at hospital  . Januvia [Sitagliptin Phosphate] Other (See Comments)    Swelling and edema see 4 13 OV    Past Medical History  Diagnosis Date  . CAD (coronary artery disease)   . DM (diabetes mellitus) ([place])   . HTN (hypertension)   . Glaucoma   . PVD (peripheral vascular disease) ([place])   . Osteoporosis   . COPD (chronic obstructive pulmonary disease) ([place])   . History of ventricular fibrillation   . Glaucoma   . Colitis   . Myocardial infarction ([place])     12/1998, 01/2011  . Diverticulitis 02/10/2012  . Colitis, indeterminate 01/31/2012    Past Surgical History  Procedure Laterality Date  . Cabg x 3  01/22/2011    Dr Cyndia Bent  . Cardiac catheterization  01/17/2011    Dr Arvella Merles  . S/p myocardial infarction and pci of the lt circumflex  2000  . S/p aortobifemoral bypass  2008    Dr Donnetta Hutching  . Abdominal hysterectomy      endometriosis  . Lipoma excision Left 02/08/2015    Procedure: EXCISION OF LEFT THIGH LIPOMA;  Surgeon: Greer Pickerel, MD;  Location: [place];  Service: General;  Laterality: Left;    Family History  Problem Relation Age of Onset  . Heart attack Father   .  Diabetes Father   . Heart disease Father   . Diabetes Mother   . Liver cancer Mother   . Hypertension    . Thyroid disease      sisters- sister had  parathyroidectomy  . Alcohol abuse      brother  . Colon polyps Maternal Uncle   . Breast cancer Sister   . Diabetes Sister   . Diabetes Brother     Social History:  reports that she has been smoking Cigarettes.  She has been smoking about 0.40 packs per day. She has never used smokeless tobacco. She reports that she does not drink alcohol or use illicit drugs.    Review of Systems    Lipid history:     Lab Results  Component Value Date   CHOL 134 07/17/2015   HDL 25.30* 07/17/2015   LDLCALC 69 07/17/2015   LDLDIRECT 87.0 04/24/2015   TRIG 196.0* 07/17/2015   CHOLHDL 5 07/17/2015           Hypertension: Present for several years Zestoretic  was reduced to half a tablet with starting Invokana and blood pressure is consistently good  She has had hypercalcemia since about 2010; PTH levels in the mid-upper range, slightly lower on this visit Calcium is now upper normal  She does not take any calcium or vitamin D She carries a diagnosis of osteoporosis in her record but no bone density records are available from several years ago No history of fractures  Most recent eye exam was normal in 9/16  Most recent foot exam: 12/16  Review of Systems    LABS:  Lab on 07/17/2015  Component Date Value Ref Range Status  . Hgb A1c MFr Bld 07/17/2015 6.6* 4.6 - 6.5 % Final   Glycemic Control Guidelines for People with Diabetes:Non Diabetic:  <6%Goal of Therapy: <7%Additional Action Suggested:  >8%   . Sodium 07/17/2015 139  135 - 145 mEq/L Final  . Potassium 07/17/2015 4.3  3.5 - 5.1 mEq/L Final  . Chloride 07/17/2015 103  96 - 112 mEq/L Final  . CO2 07/17/2015 26  19 - 32 mEq/L Final  . Glucose, Bld 07/17/2015 120* 70 - 99 mg/dL Final  . BUN 07/17/2015 27* 6 - 23 mg/dL Final  . Creatinine, Ser 07/17/2015 0.81  0.40 - 1.20 mg/dL Final  . Calcium 07/17/2015 10.3  8.4 - 10.5 mg/dL Final  . GFR 07/17/2015 75.76  >60.00 mL/min Final  . Cholesterol 07/17/2015 134  0 - 200 mg/dL  Final   ATP III Classification       Desirable:  < 200 mg/dL               Borderline High:  200 - 239 mg/dL          High:  > = 240 mg/dL  . Triglycerides 07/17/2015 196.0* 0.0 - 149.0 mg/dL Final   Normal:  <150 mg/dLBorderline High:  150 - 199 mg/dL  . HDL 07/17/2015 25.30* >39.00 mg/dL Final  . VLDL 07/17/2015 39.2  0.0 - 40.0 mg/dL Final  . LDL Cholesterol 07/17/2015 69  0 - 99 mg/dL Final  . Total CHOL/HDL Ratio 07/17/2015 5   Final                  Men          Women1/2 Average Risk     3.4          3.3Average Risk  5.0          4.42X Average Risk          9.6          7.13X Average Risk          15.0          11.0                      . NonHDL 07/17/2015 108.33   Final   NOTE:  Non-HDL goal should be 30 mg/dL higher than patient's LDL goal (i.e. LDL goal of < 70 mg/dL, would have non-HDL goal of < 100 mg/dL)  . VITD 07/17/2015 25.21* 30.00 - 100.00 ng/mL Final  . PTH 07/17/2015 26  15 - 65 pg/mL Final  . TSH 07/17/2015 4.13  0.35 - 4.50 uIU/mL Final    Physical Examination:  BP 122/72 mmHg  Pulse 85  Temp(Src) 97.8 F (36.6 C)  Resp 14  Ht 5\' 3"  (1.6 m)  Wt 156 lb 9.6 oz (71.033 kg)  BMI 27.75 kg/m2  SpO2 97%     ASSESSMENT:  Diabetes type 2, uncontrolled  See history of present illness for detailed discussion of his current management, blood sugar patterns and problems identified  Her blood sugars at home are very well controlled with A1c 6.6 They were dramatically better with adding Invokana 100 mg daily in 2016 and she is tolerating this well. Also now able to taper down her dose of Lantus by 6 units  Although she is taking as much as 20 units of Novolog at lunchtime which is her main meal she does not often check her sugars after meals No reported hypoglycemia with Novolog at breakfast or lunch She does occasionally eat carbohydrate at suppertime but does not check blood sugars after the evening meal  HYPERCALCEMIA: Likely to be hyperparathyroidism and  has been long-standing, calcium is better now Does not need bone density  HYPERTENSION:  well-controlled even with reducing her Zestoretic to half tablet   PLAN:    Continue same insulin regimen and Invokana 100 mg daily in the morning  Start checking more readings after lunch and supper, may need some insulin at suppertime if blood sugars are consistently high  A1c to be checked on the next visit  Check bone density to rule out osteoporosis related to long-standing hyperparathyroidism    Patient Instructions  Check blood sugars on waking up 3-4  times a week  Also check blood sugars about 2 hours after a meal and do this after different meals by rotation  Recommended blood sugar levels on waking up is 90-130 and about 2 hours after meal is 130-160  Please bring your blood sugar monitor to each visit, thank you  May take 10 Novolog at supper if eatinbg carbs    [facility] 07/20/2015, 9:39 AM   Note: This office note was prepared with Estate agent. Any transcriptional errors that result from this process are unintentional.

## 2015-07-20 NOTE — Patient Instructions (Addendum)
Check blood sugars on waking up 3-4  times a week  Also check blood sugars about 2 hours after a meal and do this after different meals by rotation  Recommended blood sugar levels on waking up is 90-130 and about 2 hours after meal is 130-160  Please bring your blood sugar monitor to each visit, thank you  May take 10 Novolog at supper if eatinbg carbs

## 2015-07-23 ENCOUNTER — Ambulatory Visit (INDEPENDENT_AMBULATORY_CARE_PROVIDER_SITE_OTHER)
Admission: RE | Admit: 2015-07-23 | Discharge: 2015-07-23 | Disposition: A | Discharge status: Home / Self Care | Payer: BLUE CROSS/BLUE SHIELD | Source: Ambulatory Visit | Attending: Endocrinology | Admitting: Endocrinology

## 2015-07-30 ENCOUNTER — Other Ambulatory Visit: Payer: BLUE CROSS/BLUE SHIELD

## 2015-08-10 NOTE — Progress Notes (Signed)
Quick Note:  Please let patient know that the bone density is about the same as before, no further action needed. She is only borderline for osteoporosis ______

## 2015-08-28 ENCOUNTER — Other Ambulatory Visit: Payer: Self-pay | Admitting: Internal Medicine

## 2015-09-05 ENCOUNTER — Other Ambulatory Visit: Payer: Self-pay | Admitting: *Deleted

## 2015-09-05 ENCOUNTER — Telehealth: Payer: Self-pay | Admitting: Endocrinology

## 2015-09-05 MED ORDER — INSULIN ASPART 100 UNIT/ML FLEXPEN: PEN_INJECTOR | SUBCUTANEOUS | Status: DC

## 2015-09-05 NOTE — Telephone Encounter (Signed)
Pt said she needs her Novolog Prescription sent into [place] at [facility]

## 2015-09-05 NOTE — Telephone Encounter (Signed)
RX SENT

## 2015-09-14 ENCOUNTER — Encounter: Payer: Self-pay | Admitting: Dietician

## 2015-09-14 ENCOUNTER — Encounter: Payer: BLUE CROSS/BLUE SHIELD | Attending: Endocrinology | Admitting: Dietician

## 2015-09-14 VITALS — Ht 64.0 in | Wt 155.0 lb

## 2015-09-14 DIAGNOSIS — Z029 Encounter for administrative examinations, unspecified: Secondary | ICD-10-CM | POA: Insufficient documentation

## 2015-09-14 DIAGNOSIS — E119 Type 2 diabetes mellitus without complications: Secondary | ICD-10-CM

## 2015-09-14 NOTE — Patient Instructions (Signed)
Plan:  Aim for 2-3 Carb Choices per meal (30-45 grams) +/- 1 either way  Aim for 0-1 Carbs per snack if hungry  Include protein in moderation with your meals and snacks Consider reading food labels for Total Carbohydrate and Fat Grams of foods Consider  increasing your activity level by walking for 30 minutes daily as tolerated Consider checking BG at alternate times per day as directed by MD  Consider taking medication as directed by MD  Doristine Devoid job on the changes that you have made!

## 2015-09-14 NOTE — Progress Notes (Signed)
Medical Nutrition Therapy:  Appt start time: 0920 end time:  1030.   Assessment:  Primary concerns today: Patient is here alone.  She would like to know what to eat for breakfast.  She has never eaten breakfast.  She has a hx of Type 2 Diabetes since 1995.  No diabetes education in the past.  Multiple amily members with diabetes.  She checks her blood sugar each am (98-110 usually and occasionally 70-80) and rotates after meal once daily (135-156).  She checks her feet, gets her eyes checked.  Weight has decreased from 163 lbs in  December 2016 to 155 lbs today.  She would like to reduce to 145-150 lbs.  Other hx includes:  HTN, Glaucoma, MI and CABG about 2001, osteoporosis, hyperparathyroidism, COPD, smoking (4 cigarettes/day and is working on quitting).  Labs 07/17/15:  Cholesterol 134, Triglycerides 196, HDL: 25; LDL 66, HgbA1C 6.6%, vitamin D 25, GFR 76.  Her A1C has decreased from 9.1 in November.  Patient states that the Anastasio Auerbach has helped with this.  Her blood sugar was 92 this am and she has been reducing the lantus due to improved blood sugar readings.  Patient lives alone and does her shopping and cooking.  Retired.  Widowed.   She has been trying to eat breakfast as she usually skipped.  She wants more variety in her diet.  Preferred Learning Style:   No preference indicated   Learning Readiness:   Ready  Change in progress  MEDICATIONS: see list to include:  Invokana, 10 Units of Novolog each am and 16 units of Novolog with lunch, 36 units of Lantus every HS.   DIETARY INTAKE: 24-hr recall:  B (7-8 AM): handful of plain cheerios and occasional milk but will not drink this and black coffee or skips Snk ( AM): none  L (12-1 PM): grilled or boiled chicken, [place] or fish with green beans, peas or pintos. Snk ( PM): carrots and other raw vegetable and occasional [employer]	[place] D (5-6 PM): Same as lunch Snk ( PM): none Beverages: water and black coffee  Usual  physical activity: walks (1-3 miles) or does her own yard work  Estimated energy needs: 1400 calories 158 g carbohydrates 88 g protein 47 g fat  Progress Towards Goal(s):  In progress.   Nutritional Diagnosis:  NB-1.1 Food and nutrition-related knowledge deficit As related to balance of carbohydrates, protein, and fat.  As evidenced by patient report and diet hx..    Intervention:  Nutrition counseling and diabetes education initiated. Discussed Carb Counting by food group as method of portion control, reading food labels, and benefits of increased activity.  Plan:  Aim for 2-3 Carb Choices per meal (30-45 grams) +/- 1 either way  Aim for 0-1 Carbs per snack if hungry  Include protein in moderation with your meals and snacks Consider reading food labels for Total Carbohydrate and Fat Grams of foods Consider  increasing your activity level by walking for 30 minutes daily as tolerated Consider checking BG at alternate times per day as directed by MD  Consider taking medication as directed by MD  Doristine Devoid job on the changes that you have made!   Teaching Method Utilized:  Visual Auditory Hands on  Handouts given during visit include: Living Well with Diabetes Carb Counting and Food Label handouts Meal Plan Card Breakfast ideas  Barriers to learning/adherence to lifestyle change: none  Demonstrated degree of understanding via:  Teach Back   Monitoring/Evaluation:  Dietary intake, exercise, and  body weight prn.

## 2015-09-20 ENCOUNTER — Other Ambulatory Visit: Payer: Self-pay | Admitting: Endocrinology

## 2015-10-12 ENCOUNTER — Other Ambulatory Visit (INDEPENDENT_AMBULATORY_CARE_PROVIDER_SITE_OTHER): Payer: BLUE CROSS/BLUE SHIELD

## 2015-10-12 DIAGNOSIS — E1165 Type 2 diabetes mellitus with hyperglycemia: Secondary | ICD-10-CM

## 2015-10-12 DIAGNOSIS — Z794 Long term (current) use of insulin: Secondary | ICD-10-CM

## 2015-10-12 LAB — BASIC METABOLIC PANEL
BUN: 22 mg/dL (ref 6–23)
CALCIUM: 10.3 mg/dL (ref 8.4–10.5)
CO2: 27 meq/L (ref 19–32)
CREATININE: 0.83 mg/dL (ref 0.40–1.20)
Chloride: 103 mEq/L (ref 96–112)
GFR: 73.61 mL/min (ref >60.00)
Glucose, Bld: 127 mg/dL — ABNORMAL HIGH (ref 70–99)
Potassium: 4.5 mEq/L (ref 3.5–5.1)
SODIUM: 137 meq/L (ref 135–145)

## 2015-10-12 LAB — HEMOGLOBIN A1C: HEMOGLOBIN A1C: 6.5 % (ref 4.6–6.5)

## 2015-10-17 ENCOUNTER — Ambulatory Visit (INDEPENDENT_AMBULATORY_CARE_PROVIDER_SITE_OTHER): Payer: BLUE CROSS/BLUE SHIELD | Admitting: Endocrinology

## 2015-10-17 ENCOUNTER — Encounter: Payer: Self-pay | Admitting: Endocrinology

## 2015-10-17 VITALS — BP 122/72 | HR 83 | Temp 98.0°F | Resp 14 | Ht 63.0 in | Wt 156.8 lb

## 2015-10-17 DIAGNOSIS — Z794 Long term (current) use of insulin: Secondary | ICD-10-CM

## 2015-10-17 DIAGNOSIS — E559 Vitamin D deficiency, unspecified: Secondary | ICD-10-CM | POA: Diagnosis not present

## 2015-10-17 DIAGNOSIS — I10 Essential (primary) hypertension: Secondary | ICD-10-CM

## 2015-10-17 DIAGNOSIS — E1165 Type 2 diabetes mellitus with hyperglycemia: Secondary | ICD-10-CM | POA: Diagnosis not present

## 2015-10-17 NOTE — Patient Instructions (Signed)
GLUCOSE monitoring:  May test less often in the morning, every other day  Try to test more often a couple of hours after lunch and dinner, this will help you adjust your insulin at meals  May have some carbohydrate like bread, starchy vegetables/small amount of pasta at suppertime but try taking 8 units of insulin for these meals in the evening  VITAMIN D3, 1000 units daily for the bones.  This is over-the-counter  Continue same dose of LANTUS, may reduce by 2 units if morning sugars are staying below 90

## 2015-10-17 NOTE — Progress Notes (Signed)
Patient ID: Kimberly [name], female   DOB: 02/25/1951, 65 y.o.   MRN: [id number]           Reason for Appointment: follow-up for Type 2 Diabetes  History of Present Illness:          Date of diagnosis of type 2 diabetes mellitus: 1995        Background history:   For several years she had been tried on oral agents with variable control.  Detailed history not available She thinks that she had nausea and vomiting with metformin and could not take much Also had been tried on Amaryl She thinks she had swelling with Januvia and not clear what other medications she has tried before  She was started on insulin in 07/2013 when her A1c was over 11%  Recent history:   INSULIN regimen is: 38 Lantus hs   Novolog 18  units before lunch    Because of her poor control and A1c of 9.1 she was started on Invokana 100 mg daily in 12/16 Blood sugars improved significantly after adding Invokana 100 mg and she was able to reduce her Lantus somewhat Her A1c is now down to 6.6 and appears consistent, previously 6.6  Current blood sugar patterns, management and problems identified:  Her blood sugars in the morning are excellent and she has cut back another 2 units on her Lantus  She has however had higher readings after meals at times although not checking enough  Even though she was told to have small amounts of whole-grain carbohydrates at meals by the dietitian she is afraid to do this because of tendency to high readings especially after supper  She thinks she is consistent with her lunch and usually does not have high readings postprandially except once or twice  She however does probably have more consistent high readings after supper, has only 5 readings recently to review, not taking insulin at suppertime  Her weight is stable  She is trying to be more active and going to walk almost daily   Non-insulin hypoglycemic drugs the patient is taking are:  Invokana 100 mg daily      Side effects from  medications have been: Nausea from metformin,?  Swelling from Januvia  Compliance with the medical regimen: Good Hypoglycemia: Mild as above, none recently    Glucose monitoring:  done up to 3 times a day         Glucometer:  Contour       Blood Glucose readings by time of day and averages from meter download recently:  Mean values apply above for all meters except median for One Touch  PRE-MEAL Fasting Lunch Dinner Bedtime Overall  Glucose range: 74-136  114, 120      Mean/median: 112     138   POST-MEAL PC Breakfast PC Lunch PC Dinner  Glucose range:  108-231  108-260   Mean/median:        Self-care: The diet that the patient has been following is: tries to limit fats, bread.     Meal times are:  Lunch: 1 pm Dinner: 6 PM  Typical meal intake: BreakfastIs oatmeal or grits, today cereal without milk, usually no protein              Dietician visit, most recent: 09/2015               Exercise:  walking 7/7 days a week  Weight history:  Wt Readings from Last 3 Encounters:  10/17/15  156 lb 12.8 oz (71.124 kg)  09/14/15 155 lb (70.308 kg)  07/20/15 156 lb 9.6 oz (71.033 kg)    Glycemic control:   Lab Results  Component Value Date   HGBA1C 6.5 10/12/2015   HGBA1C 6.6* 07/17/2015   HGBA1C 9.1* 04/24/2015   Lab Results  Component Value Date   MICROALBUR 0.9 04/24/2015   LDLCALC 69 07/17/2015   CREATININE 0.83 10/12/2015         Medication List       This list is accurate as of: 10/17/15  9:22 AM.  Always use your most recent med list.               albuterol 108 (90 Base) MCG/ACT inhaler  Commonly known as:  PROVENTIL HFA;VENTOLIN HFA  Inhale 2 puffs into the lungs every 6 (six) hours as needed. For shortness of breath or wheezing     aspirin 325 MG EC tablet  Take 325 mg by mouth every morning.     atorvastatin 80 MG tablet  Commonly known as:  LIPITOR  TAKE 1 TABLET (80 MG TOTAL) BY MOUTH DAILY.     ESTROVEN NIGHTTIME PO  Take by mouth.     glucose  blood test strip  Commonly known as:  BAYER CONTOUR NEXT TEST  TEST SUGAR UP TO 3 TO 4 TIMES A DAY TO TEST BLOOD SUGAR     insulin aspart 100 UNIT/ML FlexPen  Commonly known as:  NOVOLOG FLEXPEN  INJECT 6 UNITS INTO THE SKIN FOR 1 DOSE WITH LARGEST MEAL OR AS DIRECTED DAILY     Insulin Glargine 100 UNIT/ML Solostar Pen  Commonly known as:  LANTUS  Inject 40 Units into the skin at bedtime. Increase to 46 units     Insulin Pen Needle 32G X 4 MM Misc  Commonly known as:  BD PEN NEEDLE NANO U/F  USE WITH LEVEMIR FLEX PEN     INVOKANA 100 MG Tabs tablet  Generic drug:  canagliflozin  TAKE ONE TABLET BY MOUTH BEFORE BREAKFAST     lisinopril-hydrochlorothiazide 20-12.5 MG tablet  Commonly known as:  PRINZIDE,ZESTORETIC  Take 1 tablet by mouth daily.        Allergies:  Allergies  Allergen Reactions  . Metformin And Related Nausea And Vomiting    After one pill at hospital  . Januvia [Sitagliptin Phosphate] Other (See Comments)    Swelling and edema see 4 13 OV    Past Medical History  Diagnosis Date  . CAD (coronary artery disease)   . DM (diabetes mellitus) ([place])   . HTN (hypertension)   . Glaucoma   . PVD (peripheral vascular disease) ([place])   . Osteoporosis   . COPD (chronic obstructive pulmonary disease) ([place])   . History of ventricular fibrillation   . Glaucoma   . Colitis   . Myocardial infarction ([place])     12/1998, 01/2011  . Diverticulitis 02/10/2012  . Colitis, indeterminate 01/31/2012    Past Surgical History  Procedure Laterality Date  . Cabg x 3  01/22/2011    Dr Cyndia Bent  . Cardiac catheterization  01/17/2011    Dr Arvella Merles  . S/p myocardial infarction and pci of the lt circumflex  2000  . S/p aortobifemoral bypass  2008    Dr Donnetta Hutching  . Abdominal hysterectomy      endometriosis  . Lipoma excision Left 02/08/2015    Procedure: EXCISION OF LEFT THIGH LIPOMA;  Surgeon: Greer Pickerel, MD;  Location: [facility];  Service: General;  Laterality: Left;     Family History  Problem Relation Age of Onset  . Heart attack Father   . Diabetes Father   . Heart disease Father   . Diabetes Mother   . Liver cancer Mother   . Hypertension    . Thyroid disease      sisters- sister had parathyroidectomy  . Alcohol abuse      brother  . Colon polyps Maternal Uncle   . Breast cancer Sister   . Diabetes Sister   . Diabetes Brother     Social History:  reports that she has been smoking Cigarettes.  She has been smoking about 0.40 packs per day. She has never used smokeless tobacco. She reports that she does not drink alcohol or use illicit drugs.    Review of Systems    Lipid history:     Lab Results  Component Value Date   CHOL 134 07/17/2015   HDL 25.30* 07/17/2015   LDLCALC 69 07/17/2015   LDLDIRECT 87.0 04/24/2015   TRIG 196.0* 07/17/2015   CHOLHDL 5 07/17/2015           Hypertension: Present for several years Zestoretic  was reduced to half a tablet with starting Invokana and blood pressure is consistently good  She has had hypercalcemia since about 2010; PTH levels in the mid-upper range, slightly lower on this visit Calcium is Again upper normal  She does not take any calcium or vitamin D, Her last vitamin D level was 25 No history of fractures Her last bone density was normal except T score -2.3 at the wrist, no change in the bone density at the femur   Most recent eye exam was normal in 9/16  Most recent foot exam: 12/16  Review of Systems    LABS:  Lab on 10/12/2015  Component Date Value Ref Range Status  . Hgb A1c MFr Bld 10/12/2015 6.5  4.6 - 6.5 % Final   Glycemic Control Guidelines for People with Diabetes:Non Diabetic:  <6%Goal of Therapy: <7%Additional Action Suggested:  >8%   . Sodium 10/12/2015 137  135 - 145 mEq/L Final  . Potassium 10/12/2015 4.5  3.5 - 5.1 mEq/L Final  . Chloride 10/12/2015 103  96 - 112 mEq/L Final  . CO2 10/12/2015 27  19 - 32 mEq/L Final  . Glucose, Bld 10/12/2015 127* 70 -  99 mg/dL Final  . BUN 10/12/2015 22  6 - 23 mg/dL Final  . Creatinine, Ser 10/12/2015 0.83  0.40 - 1.20 mg/dL Final  . Calcium 10/12/2015 10.3  8.4 - 10.5 mg/dL Final  . GFR 10/12/2015 73.61  >60.00 mL/min Final    Physical Examination:  BP 122/72 mmHg  Pulse 83  Temp(Src) 98 F (36.7 C)  Resp 14  Ht 5\' 3"  (1.6 m)  Wt 156 lb 12.8 oz (71.124 kg)  BMI 27.78 kg/m2  SpO2 95%     ASSESSMENT:  Diabetes type 2, uncontrolled  See history of present illness for detailed discussion of his current management, blood sugar patterns and problems identified  Her blood sugars at home are generally well controlled with A1c 6.5, had benefited definitely from adding Invokana with reduction in insulin requirement However she does not take insulin at suppertime and still has some high readings despite trying to reduce her carbohydrate intake in the evenings She does think that she is needing to be restricted with her intake in the evening for fear of hypoglycemia  Still requiring 18 units of  Novolog for covering her lunch meal with fair control Overall compliance with diet has been fairly good However she does not always get protein with breakfast in the morning, today only had Cheerios  HYPERCALCEMIA: Likely to be hyperparathyroidism and has been long-standing, calcium is stable at upper normal levels now Has mild osteopenia at the wrist only  HYPERTENSION:  well-controlled with current regimen   PLAN:    Continue same Lantus insulin regimen and Invokana 100 mg daily in the morning  Start 8 units of Novolog at suppertime when eating any carbohydrate, may need higher doses based on postprandial readings  More readings after meals unless fasting  May adjust lunchtime dose based on amount of carbohydrate  Add protein to breakfast but also periodically checks some readings after morning meal  A1c to be checked on the next visit  Vitamin D3, 1000 units daily at least, needs follow-up  level in the future    Patient Instructions  GLUCOSE monitoring:  May test less often in the morning, every other day  Try to test more often a couple of hours after lunch and dinner, this will help you adjust your insulin at meals  May have some carbohydrate like bread, starchy vegetables/small amount of pasta at suppertime but try taking 8 units of insulin for these meals in the evening  VITAMIN D3, 1000 units daily for the bones.  This is over-the-counter  Continue same dose of LANTUS, may reduce by 2 units if morning sugars are staying below 90   Counseling time on subjects discussed above is over 50% of today's 25 minute visit  Kaytlen Lightsey 10/17/2015, 9:22 AM   Note: This office note was prepared with Estate agent. Any transcriptional errors that result from this process are unintentional.

## 2015-10-29 ENCOUNTER — Encounter: Payer: Self-pay | Admitting: Internal Medicine

## 2015-10-29 ENCOUNTER — Ambulatory Visit (INDEPENDENT_AMBULATORY_CARE_PROVIDER_SITE_OTHER): Payer: BLUE CROSS/BLUE SHIELD | Admitting: Internal Medicine

## 2015-10-29 VITALS — BP 120/80 | HR 89 | Temp 98.3°F | Ht 63.0 in | Wt 156.0 lb

## 2015-10-29 DIAGNOSIS — E1151 Type 2 diabetes mellitus with diabetic peripheral angiopathy without gangrene: Secondary | ICD-10-CM

## 2015-10-29 DIAGNOSIS — E1169 Type 2 diabetes mellitus with other specified complication: Secondary | ICD-10-CM | POA: Diagnosis not present

## 2015-10-29 DIAGNOSIS — E785 Hyperlipidemia, unspecified: Secondary | ICD-10-CM

## 2015-10-29 DIAGNOSIS — Z79899 Other long term (current) drug therapy: Secondary | ICD-10-CM

## 2015-10-29 DIAGNOSIS — I10 Essential (primary) hypertension: Secondary | ICD-10-CM | POA: Diagnosis not present

## 2015-10-29 DIAGNOSIS — F17200 Nicotine dependence, unspecified, uncomplicated: Secondary | ICD-10-CM

## 2015-10-29 DIAGNOSIS — R011 Cardiac murmur, unspecified: Secondary | ICD-10-CM

## 2015-10-29 NOTE — Progress Notes (Signed)
Chief Complaint  Patient presents with  . Follow-up    Diabetes well managed now, per pt    HPI: Kimberly [name] 65 y.o.  Comes in for  Chronic disease management,now seeing Dr Dwyane Dee or diabetes and doing better  An can afford at this time. blood pressure:  No problem with bp meds  No cv sx . Activity :  Sitting   With people  About 40 hours  In a rehab home  Sleep  Good .  Tobacco still at four.  Per day Appetite good.   No sig depression .  ROS: See pertinent positives and negatives per HPI. Denies claudication cp sob edema  Mood is good   Past Medical History  Diagnosis Date  . CAD (coronary artery disease)   . DM (diabetes mellitus) ([place])   . HTN (hypertension)   . Glaucoma   . PVD (peripheral vascular disease) ([place])   . Osteoporosis   . COPD (chronic obstructive pulmonary disease) ([place])   . History of ventricular fibrillation   . Glaucoma   . Colitis   . Myocardial infarction ([place])     12/1998, 01/2011  . Diverticulitis 02/10/2012  . Colitis, indeterminate 01/31/2012    Family History  Problem Relation Age of Onset  . Heart attack Father   . Diabetes Father   . Heart disease Father   . Diabetes Mother   . Liver cancer Mother   . Hypertension    . Thyroid disease      sisters- sister had parathyroidectomy  . Alcohol abuse      brother  . Colon polyps Maternal Uncle   . Breast cancer Sister   . Diabetes Sister   . Diabetes Brother     Social History   Social History  . Marital Status: Married    Spouse Name: N/A  . Number of Children: 3  . Years of Education: N/A   Occupational History  . housewife    Social History Main Topics  . Smoking status: Current Every Day Smoker -- 0.40 packs/day    Types: Cigarettes  . Smokeless tobacco: Never Used  . Alcohol Use: No  . Drug Use: No  . Sexual Activity: Not Asked   Other Topics Concern  . None   Social History Narrative   Married just widowed    Regular exercise- yes not recently       Quit  tobacco in August at heart surgery. Restarted    Husband diagnosed with liver cancer since 2014 passed April 2015    She was care taker.   No pets    Outpatient Prescriptions Prior to Visit  Medication Sig Dispense Refill  . albuterol (PROVENTIL HFA;VENTOLIN HFA) 108 (90 BASE) MCG/ACT inhaler Inhale 2 puffs into the lungs every 6 (six) hours as needed. For shortness of breath or wheezing 1 Inhaler 2  . aspirin 325 MG EC tablet Take 325 mg by mouth every morning.     Marland Kitchen atorvastatin (LIPITOR) 80 MG tablet TAKE 1 TABLET (80 MG TOTAL) BY MOUTH DAILY. 90 tablet 1  . glucose blood (BAYER CONTOUR NEXT TEST) test strip TEST SUGAR UP TO 3 TO 4 TIMES A DAY TO TEST BLOOD SUGAR 100 each 11  . insulin aspart (NOVOLOG FLEXPEN) 100 UNIT/ML FlexPen INJECT 6 UNITS INTO THE SKIN FOR 1 DOSE WITH LARGEST MEAL OR AS DIRECTED DAILY (Patient taking differently: INJECT 18 UNITS INTO THE SKIN FOR 1 DOSE WITH LARGEST MEAL OR AS DIRECTED DAILY) 15 mL  1  . Insulin Glargine (LANTUS) 100 UNIT/ML Solostar Pen Inject 40 Units into the skin at bedtime. Increase to 46 units (Patient taking differently: Inject 38 Units into the skin at bedtime. ) 15 mL 3  . Insulin Pen Needle (BD PEN NEEDLE NANO U/F) 32G X 4 MM MISC USE WITH LEVEMIR FLEX PEN 100 each 5  . INVOKANA 100 MG TABS tablet TAKE ONE TABLET BY MOUTH BEFORE BREAKFAST 30 tablet 2  . lisinopril-hydrochlorothiazide (PRINZIDE,ZESTORETIC) 20-12.5 MG tablet Take 1 tablet by mouth daily. (Patient taking differently: Take 1 tablet by mouth daily. Takes 1/2 tablet daily) 90 tablet 1  . Nutritional Supplements (ESTROVEN NIGHTTIME PO) Take by mouth.     No facility-administered medications prior to visit.     EXAM:  BP 120/80 mmHg  Pulse 89  Temp(Src) 98.3 F (36.8 C) (Oral)  Ht 5\' 3"  (1.6 m)  Wt 156 lb (70.761 kg)  BMI 27.64 kg/m2  SpO2 98%  Body mass index is 27.64 kg/(m^2).  GENERAL: vitals reviewed and listed above, alert, oriented, appears well hydrated and in no  acute distress HEENT: atraumatic, conjunctiva  clear, no obvious abnormalities on inspection of external nose and ears NECK: no obvious masses on inspection palpation  LUNGS: clear to auscultation bilaterally, no wheezes, rales or rhonchi, good air movement CV: HRRR,  2-3/6 sem lusb not in neck noradiation  no clubbing cyanosis or  peripheral edema nl cap refill  Pulsed left dp 2+  Right ?2-   MS: moves all extremities without noticeable focal  abnormality PSYCH: pleasant and cooperative, no obvious depression or anxiety Lab Results  Component Value Date   WBC 6.3 04/24/2015   HGB 16.0* 04/24/2015   HCT 47.3* 04/24/2015   PLT 141.0* 04/24/2015   GLUCOSE 127* 10/12/2015   CHOL 134 07/17/2015   TRIG 196.0* 07/17/2015   HDL 25.30* 07/17/2015   LDLDIRECT 87.0 04/24/2015   LDLCALC 69 07/17/2015   ALT 21 04/24/2015   AST 17 04/24/2015   NA 137 10/12/2015   K 4.5 10/12/2015   CL 103 10/12/2015   CREATININE 0.83 10/12/2015   BUN 22 10/12/2015   CO2 27 10/12/2015   TSH 4.13 07/17/2015   INR 1.06 02/01/2012   HGBA1C 6.5 10/12/2015   MICROALBUR 0.9 04/24/2015   BP Readings from Last 3 Encounters:  10/29/15 120/80  10/17/15 122/72  07/20/15 122/72   Wt Readings from Last 3 Encounters:  10/29/15 156 lb (70.761 kg)  10/17/15 156 lb 12.8 oz (71.124 kg)  09/14/15 155 lb (70.308 kg)    ASSESSMENT AND PLAN:  Discussed the following assessment and plan:  Essential hypertension  Medication management  Hyperlipidemia associated with type 2 diabetes mellitus ([place])  Diabetes mellitus with peripheral vascular disease ([place])  SYSTOLIC MURMUR  Tobacco use disorder - encouraged dc  Stop tobacco as on going  No sa sx  Doing much better mood and activity  Will review record about   Other loose ends but dr Dwyane Dee seeing pt for  Other disease management .  Cad s/p cabg  Doing well.  -Patient advised to return or notify health care team  if symptoms worsen ,persist or new concerns  arise.  Patient Instructions  No change in meds glad you are doing well.,  Will review record incase want to do any updated testing  Such as echo before next visit . Contact us if any concerns   the interim .Marland Kitchen  Wellness check up and labs if needed in 1 year .  Still stop tobacco     Standley Brooking. Laelle Bridgett M.D.  Result status: Final result    ------------------------------------------------------------ Transthoracic Echocardiography  Patient:  Timeca, Valido MR #:    [id number] Study Date: 08/11/2011 Gender:   F Age:    36 Height:   167.6cm Weight:   69.9kg BSA:    1.43m^2 Pt. Status: Room:  Jetty Duhamel ATTENDING  Darlin Coco, MD REFERRING  Shanon Ace K PERFORMING  Zacarias Pontes, [facility] SONOGRAPHER Victorio Palm, RDCS cc:  ------------------------------------------------------------ LV EF: 55% -  60%  ------------------------------------------------------------ Indications:   CAD of native vessels 414.01. Murmur 785.2.  ------------------------------------------------------------ History:  PMH: PVD. Acquired from the patient and from the patient's chart. Systolic murmur. Coronary artery disease. Chronic obstructive pulmonary disease. Risk factors: Former tobacco use. Hypertension. Diabetes mellitus.  ------------------------------------------------------------ Study Conclusions  - Left ventricle: The cavity size was normal. Shipes thickness was increased in a pattern of mild LVH. Systolic function was normal. The estimated ejection fraction was in the range of 55% to 60%. Lennon motion was normal; there were no regional Munshi motion abnormalities. Doppler parameters are consistent with abnormal left ventricular relaxation (grade 1 diastolic dysfunction). - Ventricular septum: Septal motion showed paradox. - Mitral valve: Mild regurgitation. - Left atrium: The atrium was mildly dilated. - Right  ventricle: The cavity size was mildly to moderately dilated. - Right atrium: The atrium was mildly to moderately dilated. - Atrial septum: There was an atrial septal aneurysm.  ------------------------------------------------------------ Labs, prior tests, procedures, and surgery: Echocardiography (August 2012).  The study demonstrated LV hypertrophy and mild right atrial and ventricular enlargement. EF was 65% and PA pressure was 40 (systolic).  Catheterization.  The study demonstrated coronary artery disease. There was a stenosis in the left circumflex coronary artery which was treated with a stent. Coronary artery bypass grafting. Transthoracic echocardiography. M-mode, complete 2D, spectral Doppler, and color Doppler. Height: Height: 167.6cm. Height: 66in. Weight: Weight: 69.9kg. Weight: 153.7lb. Body mass index: BMI: 24.9kg/m^2. Body surface area:  BSA: 1.38m^2. Blood pressure:   153/90. Patient status: Outpatient. Location: [facility]  ------------------------------------------------------------  ------------------------------------------------------------ Left ventricle: The cavity size was normal. Elahi thickness was increased in a pattern of mild LVH. Systolic function was normal. The estimated ejection fraction was in the range of 55% to 60%. Fredericks motion was normal; there were no regional Fackrell motion abnormalities. Doppler parameters are consistent with abnormal left ventricular relaxation (grade 1 diastolic dysfunction).  ------------------------------------------------------------ Aortic valve:  Trileaflet; mildly calcified leaflets. Doppler:  There was no stenosis.  No regurgitation.  ------------------------------------------------------------ Aorta: The aorta was normal, not dilated, and non-diseased.  ------------------------------------------------------------ Mitral valve:  Structurally normal valve.   Leaflet separation was normal. Doppler: Transvalvular velocity was within the normal range. There was no evidence for stenosis. Mild regurgitation.  ------------------------------------------------------------ Left atrium: The atrium was mildly dilated.  ------------------------------------------------------------ Atrial septum: There was an atrial septal aneurysm.  ------------------------------------------------------------ Right ventricle: The cavity size was mildly to moderately dilated. Systolic function was normal.  ------------------------------------------------------------ Ventricular septum:  Septal motion showed paradox.  ------------------------------------------------------------ Pulmonic valve:  The valve appears to be grossly normal. Doppler:  Mild regurgitation.  ------------------------------------------------------------ Tricuspid valve:  Structurally normal valve.  Leaflet separation was normal. Doppler: Transvalvular velocity was within the normal range. Mild regurgitation.  ------------------------------------------------------------ Pulmonary artery:  The main pulmonary artery was normal-sized. Systolic pressure was at the upper limits of normal.  ------------------------------------------------------------ Right atrium: The atrium was mildly to moderately dilated.  ------------------------------------------------------------ Pericardium: The pericardium  was normal in appearance.  ------------------------------------------------------------ Systemic veins: Inferior vena cava: The vessel was normal in size; the respirophasic diameter changes were in the normal range (= 50%); findings are consistent with normal central venous pressure.  ------------------------------------------------------------ Post procedure conclusions Ascending Aorta:  - The aorta was normal, not dilated, and  non-diseased.  ------------------------------------------------------------  2D measurements    Normal Doppler measurements  Normal Left ventricle         Main pulmonary LVID ED,  36.4 mm   43-52  artery chord,             Pressure,   30 mm Hg =30 PLAX              S LVID ES,  20.9 mm   23-38  Left ventricle chord,             Ea, lat   8.77 cm/s ------ PLAX              ann, tiss FS, chord,  43 %   >29   DP PLAX              E/Ea, lat  6.75    ------ LVPW, ED  11.8 mm   ------ ann, tiss IVS/LVPW  1.03    <1.3  DP ratio, ED           Ea, med   5.59 cm/s ------ Ventricular septum       ann, tiss IVS, ED  12.2 mm   ------ DP LVOT              E/Ea, med 10.59    ------ Diam, S   19 mm   ------ ann, tiss Area    2.84 cm^2  ------ DP Diam     19 mm   ------ LVOT Aorta             Peak vel,  79.7 cm/s ------ Root diam,  30 mm   ------ S ED               VTI, S   14.4 cm  ------ Left atrium          Stroke vol 40.8 ml  ------ AP dim    41 mm   ------ Stroke   22.8 ml/m^ ------ AP dim   2.29 cm/m^2 <2.2  index      2 index             Mitral valve                Peak E vel 59.2 cm/s ------                Peak A vel 82.4 cm/s ------                Decelerati  190 ms  150-23                on time        0                Peak E/A   0.7    ------                ratio                Tricuspid valve                Regurg    251 cm/s ------  peak vel                Peak RV-RA  25 mm Hg ------                gradient,                 S                Systemic veins                Estimated   5 mm Hg ------                CVP                Right ventricle                Pressure,   30 mm Hg <30                S                Sa vel,   8.01 cm/s ------                lat ann,                tiss DP  ------------------------------------------------------------ Prepared and Electronically Authenticated by  Jenkins Rouge 2013-02-25T09:04:[id number]

## 2015-10-29 NOTE — Patient Instructions (Addendum)
No change in meds glad you are doing well.,  Will review record incase want to do any updated testing  Such as echo before next visit . Contact us if any concerns   the interim .Marland Kitchen  Wellness check up and labs if needed in 1 year . Still stop tobacco

## 2015-11-05 ENCOUNTER — Telehealth: Payer: Self-pay | Admitting: Family Medicine

## 2015-11-05 ENCOUNTER — Other Ambulatory Visit: Payer: Self-pay | Admitting: Family Medicine

## 2015-11-05 DIAGNOSIS — I517 Cardiomegaly: Secondary | ICD-10-CM

## 2015-11-05 DIAGNOSIS — R011 Cardiac murmur, unspecified: Secondary | ICD-10-CM

## 2015-11-05 NOTE — Telephone Encounter (Signed)
-----   Message from Burnis Medin, MD sent at 11/04/2015 11:45 AM EDT ----- Record reveiwed . Last echo 2013 .Pleaese contact patient and report  I advice another echocardioghram  Dx murmur and lvh ans sp cabg  To recheck the murmur. thanks

## 2015-11-05 NOTE — Telephone Encounter (Signed)
Spoke to the pt and advised that [facility] would like to proceed with echo.  Pt agreed and order placed in the system.

## 2015-11-27 ENCOUNTER — Ambulatory Visit (HOSPITAL_COMMUNITY): Payer: BLUE CROSS/BLUE SHIELD | Attending: Internal Medicine

## 2015-11-27 ENCOUNTER — Other Ambulatory Visit: Payer: Self-pay

## 2015-11-27 DIAGNOSIS — I358 Other nonrheumatic aortic valve disorders: Secondary | ICD-10-CM | POA: Diagnosis not present

## 2015-11-27 DIAGNOSIS — I340 Nonrheumatic mitral (valve) insufficiency: Secondary | ICD-10-CM | POA: Diagnosis not present

## 2015-11-27 DIAGNOSIS — I071 Rheumatic tricuspid insufficiency: Secondary | ICD-10-CM | POA: Diagnosis not present

## 2015-11-27 DIAGNOSIS — I517 Cardiomegaly: Secondary | ICD-10-CM

## 2015-11-27 DIAGNOSIS — R011 Cardiac murmur, unspecified: Secondary | ICD-10-CM

## 2015-11-27 LAB — ECHOCARDIOGRAM COMPLETE
E decel time: 155 msec
EERAT: 3.62
FS: 35 % (ref 28–44)
IV/PV OW: 1.48
LA diam end sys: 41 mm
LA vol A4C: 20 ml
LA vol index: 16.1 mL/m2
LADIAMINDEX: 2.36 cm/m2
LASIZE: 41 mm
LAVOL: 28 mL
LDCA: 3.46 cm2
LV E/e' medial: 3.62
LV E/e'average: 3.62
LV PW d: 9.91 mm — AB (ref 0.6–1.1)
LV TDI E'LATERAL: 10.5
LV TDI E'MEDIAL: 5.04
LV e' LATERAL: 10.5 cm/s
LVOT SV: 49 mL
LVOT VTI: 14.3 cm
LVOT diameter: 21 mm
LVOTPV: 91 cm/s
MV Dec: 155
MV pk A vel: 79 m/s
MVPKEVEL: 38 m/s
Reg peak vel: 282 cm/s
TR max vel: 282 cm/s

## 2015-12-03 ENCOUNTER — Other Ambulatory Visit: Payer: Self-pay | Admitting: Family Medicine

## 2015-12-03 DIAGNOSIS — R931 Abnormal findings on diagnostic imaging of heart and coronary circulation: Secondary | ICD-10-CM

## 2015-12-11 NOTE — Progress Notes (Signed)
Cardiology Office Note   Date:  12/12/2015   ID:  Kimberly [name], DOB 05-04-1951, MRN [id number]  PCP:  Lottie Dawson, MD  Cardiologist:   Jenkins Rouge, MD   No chief complaint on file.     History of Present Illness: Kimberly [name] is a 65 y.o. female who presents for evaluation of murmur and abnormal echo Reviewed from 11/27/15 Diabetic , smoker with elevated lipids and HTN:  I last saw her in 2013.  She has had CABG and aortobifem  Previous CAD with stent to circumflex. 2012 . Operated on by [facility]  CABG x3 using a left internal mammary  artery graft to the left anterior descending coronary artery with a  saphenous vein graft to the obtuse marginal branch of the left  circumflex coronary artery, and a saphenous vein graft to the right  coronary artery.  Study Conclusions  - Left ventricle: The cavity size was normal. There was moderate  focal basal hypertrophy of the septum. Systolic function was  normal. The estimated ejection fraction was in the range of 60%  to 65%. Conger motion was normal; there were no regional Kimberly [name]  motion abnormalities. Doppler parameters are consistent with  abnormal left ventricular relaxation (grade 1 diastolic  dysfunction). The E/e&' ratio is between 8-15, suggesting  indeterminate LV filling pressure. - Ventricular septum: Septal motion is incoordinate. - Mitral valve: Mildly thickened leaflets . There was trace to mild  regurgitation. - Aortic valve: Trileaflet. Sclerosis without stenosis. There was  no regurgitation. - Left atrium: The atrium was normal in size. - Right ventricle: The cavity size was mildly dilated. Systolic  function is reduced. - Tricuspid valve: There was mild regurgitation. - Pulmonary arteries: PA peak pressure: 35 mm Hg (S). - Inferior vena cava: The vessel was normal in size. The  respirophasic diameter changes were in the normal range (= 50%),  consistent with normal central venous  pressure.  Impressions:  - Compared to the prior echo in 2013, the LVEF is higher at 60-65%.  Incoordinate septal motion. There is aortic valve sclerosis,  trace to mild MR and Mild TR with RVSP of 35 mmHg.  She is asymptomatic no dyspnea chest pain claudication or palpitations. She is down to 4 cigs/day Smoking.  She has not had a CXR in a long time.   Past Medical History  Diagnosis Date  . CAD (coronary artery disease)   . DM (diabetes mellitus) ([place])   . HTN (hypertension)   . Glaucoma   . PVD (peripheral vascular disease) ([place])   . Osteoporosis   . COPD (chronic obstructive pulmonary disease) ([place])   . History of ventricular fibrillation   . Glaucoma   . Colitis   . Myocardial infarction ([place])     12/1998, 01/2011  . Diverticulitis 02/10/2012  . Colitis, indeterminate 01/31/2012    Past Surgical History  Procedure Laterality Date  . Cabg x 3  01/22/2011    Dr Cyndia Bent  . Cardiac catheterization  01/17/2011    Dr Arvella Merles  . S/p myocardial infarction and pci of the lt circumflex  2000  . S/p aortobifemoral bypass  2008    Dr Donnetta Hutching  . Abdominal hysterectomy      endometriosis  . Lipoma excision Left 02/08/2015    Procedure: EXCISION OF LEFT THIGH LIPOMA;  Surgeon: Greer Pickerel, MD;  Location: [facility];  Service: General;  Laterality: Left;     Current Outpatient Prescriptions  Medication Sig Dispense Refill  .  albuterol (PROVENTIL HFA;VENTOLIN HFA) 108 (90 BASE) MCG/ACT inhaler Inhale 2 puffs into the lungs every 6 (six) hours as needed. For shortness of breath or wheezing 1 Inhaler 2  . aspirin 325 MG EC tablet Take 325 mg by mouth every morning.     Marland Kitchen atorvastatin (LIPITOR) 80 MG tablet TAKE 1 TABLET (80 MG TOTAL) BY MOUTH DAILY. 90 tablet 1  . glucose blood (BAYER CONTOUR NEXT TEST) test strip TEST SUGAR UP TO 3 TO 4 TIMES A DAY TO TEST BLOOD SUGAR 100 each 11  . insulin aspart (NOVOLOG) 100 UNIT/ML injection Inject into the skin. 10 ml into skin in the  morning and 18 into the skin at lunch    . insulin glargine (LANTUS) 100 UNIT/ML injection Inject 38 Units into the skin at bedtime.    . Insulin Pen Needle (BD PEN NEEDLE NANO U/F) 32G X 4 MM MISC USE WITH LEVEMIR FLEX PEN 100 each 5  . INVOKANA 100 MG TABS tablet TAKE ONE TABLET BY MOUTH BEFORE BREAKFAST 30 tablet 2  . lisinopril-hydrochlorothiazide (PRINZIDE,ZESTORETIC) 20-12.5 MG tablet Take 0.5 tablets by mouth daily.     No current facility-administered medications for this visit.    Allergies:   Metformin and related and Januvia    Social History:  The patient  reports that she has been smoking Cigarettes.  She has been smoking about 0.40 packs per day. She has never used smokeless tobacco. She reports that she does not drink alcohol or use illicit drugs.   Family History:  The patient's family history includes Breast cancer in her sister; Colon polyps in her maternal uncle; Diabetes in her brother, father, mother, and sister; Heart attack in her father; Heart disease in her father; Liver cancer in her mother.    ROS:  Please see the history of present illness.   Otherwise, review of systems are positive for none.   All other systems are reviewed and negative.    PHYSICAL EXAM: VS:  BP 138/64 mmHg  Pulse 98  Ht 5\' 4"  (1.626 m)  Wt 158 lb 6.4 oz (71.85 kg)  BMI 27.18 kg/m2 , BMI Body mass index is 27.18 kg/(m^2). Affect appropriate Healthy:  appears stated age 53: normal Neck supple with no adenopathy JVP normal no bruits no thyromegaly Lungs clear with no wheezing and good diaphragmatic motion Heart:  S1/S2 SEM  murmur, no rub, gallop or click Sternotomy PMI normal Abdomen: benighn, BS positve, no tenderness,  Post aortobifem no bruit.  No HSM or HJR Aortobifem  No edema Neuro non-focal Skin warm and dry No muscular weakness    EKG:  SR rate 83 normal 01/27/11  12/12/15  SR rate 99 normal    Recent Labs: 04/24/2015: ALT 21; Hemoglobin 16.0*; Platelets  141.0* 07/17/2015: TSH 4.13 10/12/2015: BUN 22; Creatinine, Ser 0.83; Potassium 4.5; Sodium 137    Lipid Panel    Component Value Date/Time   CHOL 134 07/17/2015 0839   TRIG 196.0* 07/17/2015 0839   HDL 25.30* 07/17/2015 0839   CHOLHDL 5 07/17/2015 0839   VLDL 39.2 07/17/2015 0839   LDLCALC 69 07/17/2015 0839   LDLDIRECT 87.0 04/24/2015 0800      Wt Readings from Last 3 Encounters:  12/12/15 158 lb 6.4 oz (71.85 kg)  10/29/15 156 lb (70.761 kg)  10/17/15 156 lb 12.8 oz (71.124 kg)      Other studies Reviewed: Additional studies/ records that were reviewed today include:  Primary care notes ECG echo  Op notes CABG  and Aorto bifem    ASSESSMENT AND PLAN:  1.  CAD/CABG:  2012 active no chest pain continue ASA no need for stress test 2. PVD:  aortobifem although no claudication needs Abd [place] and LE arterial duplex To r/o residaul aneurysm and assess graft anastomosis 3. HTN  Well controlled.  Continue current medications and low sodium Dash type diet.   4. Smoking  Counseled on cessation and relation to vascular disease and graft failure CXR to screen for cancer 5 DM  Discussed low carb diet.  Target hemoglobin A1c is 6.5 or less.  Continue current medications. 6 Chol   Lab Results  Component Value Date   LDLCALC 69 07/17/2015  7. Abnormal echo:  AV sclerosis murmur abnormal septal motion benign likely due to previous CABG.      Current medicines are reviewed at length with the patient today.  The patient does not have concerns regarding medicines.  The following changes have been made:  no change  Labs/ tests ordered today include: CXR Abd [place] and LE arterial duplex   No orders of the defined types were placed in this encounter.     Disposition:   FU with me in a year      Signed, Jenkins Rouge, MD  12/12/2015 2:58 PM    [address], [place], [place]  [address] Phone: [phone]; Fax: [phone]

## 2015-12-12 ENCOUNTER — Encounter: Payer: Self-pay | Admitting: Cardiovascular Disease

## 2015-12-12 ENCOUNTER — Ambulatory Visit (INDEPENDENT_AMBULATORY_CARE_PROVIDER_SITE_OTHER): Payer: BLUE CROSS/BLUE SHIELD | Admitting: Cardiovascular Disease

## 2015-12-12 VITALS — BP 138/64 | HR 98 | Ht 64.0 in | Wt 158.4 lb

## 2015-12-12 DIAGNOSIS — Z95828 Presence of other vascular implants and grafts: Secondary | ICD-10-CM | POA: Diagnosis not present

## 2015-12-12 DIAGNOSIS — Z720 Tobacco use: Secondary | ICD-10-CM | POA: Diagnosis not present

## 2015-12-12 DIAGNOSIS — I10 Essential (primary) hypertension: Secondary | ICD-10-CM

## 2015-12-12 DIAGNOSIS — I739 Peripheral vascular disease, unspecified: Secondary | ICD-10-CM

## 2015-12-12 DIAGNOSIS — F17200 Nicotine dependence, unspecified, uncomplicated: Secondary | ICD-10-CM

## 2015-12-12 NOTE — Patient Instructions (Addendum)
Medication Instructions:  Your physician recommends that you continue on your current medications as directed. Please refer to the Current Medication list given to you today.  Labwork: NONE  Testing/Procedures: Your physician has requested that you have a lower extremity arterial exercise duplex. During this test, exercise and ultrasound are used to evaluate arterial blood flow in the legs. Allow one hour for this exam. There are no restrictions or special instructions.  Your physician has requested that you have an abdominal aorta duplex. During this test, an ultrasound is used to evaluate the aorta. Allow 30 minutes for this exam. Do not eat after midnight the day before and avoid carbonated beverages.  A chest x-ray takes a picture of the organs and structures inside the chest, including the heart, lungs, and blood vessels. This test can show several things, including, whether the heart is enlarges; whether fluid is building up in the lungs; and whether pacemaker / defibrillator leads are still in place.   Follow-Up: Your physician wants you to follow-up in: 12 months with Dr. Johnsie Cancel. You will receive a reminder letter in the mail two months in advance. If you don't receive a letter, please call our office to schedule the follow-up appointment.   If you need a refill on your cardiac medications before your next appointment, please call your pharmacy.

## 2015-12-13 ENCOUNTER — Ambulatory Visit (INDEPENDENT_AMBULATORY_CARE_PROVIDER_SITE_OTHER)
Admission: RE | Admit: 2015-12-13 | Discharge: 2015-12-13 | Disposition: A | Discharge status: Home / Self Care | Payer: BLUE CROSS/BLUE SHIELD | Source: Ambulatory Visit | Attending: Cardiovascular Disease | Admitting: Cardiovascular Disease

## 2015-12-13 DIAGNOSIS — Z720 Tobacco use: Secondary | ICD-10-CM

## 2015-12-13 DIAGNOSIS — F17200 Nicotine dependence, unspecified, uncomplicated: Secondary | ICD-10-CM

## 2015-12-17 ENCOUNTER — Other Ambulatory Visit: Payer: Self-pay | Admitting: Endocrinology

## 2015-12-19 ENCOUNTER — Other Ambulatory Visit: Payer: Self-pay | Admitting: Cardiovascular Disease

## 2015-12-19 DIAGNOSIS — Z95828 Presence of other vascular implants and grafts: Secondary | ICD-10-CM

## 2015-12-19 DIAGNOSIS — I739 Peripheral vascular disease, unspecified: Secondary | ICD-10-CM

## 2015-12-27 ENCOUNTER — Inpatient Hospital Stay (HOSPITAL_COMMUNITY): Admission: RE | Admit: 2015-12-27 | Payer: BLUE CROSS/BLUE SHIELD | Source: Ambulatory Visit

## 2015-12-27 ENCOUNTER — Encounter (HOSPITAL_COMMUNITY): Payer: BLUE CROSS/BLUE SHIELD

## 2016-01-15 ENCOUNTER — Other Ambulatory Visit: Payer: Self-pay | Admitting: Endocrinology

## 2016-01-17 ENCOUNTER — Other Ambulatory Visit (INDEPENDENT_AMBULATORY_CARE_PROVIDER_SITE_OTHER): Payer: BLUE CROSS/BLUE SHIELD

## 2016-01-17 DIAGNOSIS — E1165 Type 2 diabetes mellitus with hyperglycemia: Secondary | ICD-10-CM

## 2016-01-17 DIAGNOSIS — E559 Vitamin D deficiency, unspecified: Secondary | ICD-10-CM | POA: Diagnosis not present

## 2016-01-17 DIAGNOSIS — Z794 Long term (current) use of insulin: Secondary | ICD-10-CM

## 2016-01-17 LAB — COMPREHENSIVE METABOLIC PANEL
ALBUMIN: 4.3 g/dL (ref 3.5–5.2)
ALK PHOS: 99 U/L (ref 39–117)
ALT: 18 U/L (ref 0–35)
AST: 18 U/L (ref 0–37)
BILIRUBIN TOTAL: 0.6 mg/dL (ref 0.2–1.2)
BUN: 21 mg/dL (ref 6–23)
CALCIUM: 10.3 mg/dL (ref 8.4–10.5)
CHLORIDE: 101 meq/L (ref 96–112)
CO2: 27 mEq/L (ref 19–32)
CREATININE: 0.84 mg/dL (ref 0.40–1.20)
GFR: 72.53 mL/min (ref >60.00)
Glucose, Bld: 125 mg/dL — ABNORMAL HIGH (ref 70–99)
Potassium: 4.3 mEq/L (ref 3.5–5.1)
SODIUM: 138 meq/L (ref 135–145)
TOTAL PROTEIN: 7.3 g/dL (ref 6.0–8.3)

## 2016-01-17 LAB — HEMOGLOBIN A1C: Hgb A1c MFr Bld: 6.5 % (ref 4.6–6.5)

## 2016-01-17 LAB — VITAMIN D 25 HYDROXY (VIT D DEFICIENCY, FRACTURES): VITD: 38.81 ng/mL (ref 30.00–100.00)

## 2016-01-22 ENCOUNTER — Ambulatory Visit (INDEPENDENT_AMBULATORY_CARE_PROVIDER_SITE_OTHER): Payer: BLUE CROSS/BLUE SHIELD | Admitting: Endocrinology

## 2016-01-22 ENCOUNTER — Encounter: Payer: Self-pay | Admitting: Endocrinology

## 2016-01-22 ENCOUNTER — Other Ambulatory Visit: Payer: Self-pay | Admitting: Internal Medicine

## 2016-01-22 VITALS — BP 122/64 | HR 98 | Ht 64.0 in | Wt 157.4 lb

## 2016-01-22 DIAGNOSIS — Z794 Long term (current) use of insulin: Secondary | ICD-10-CM

## 2016-01-22 DIAGNOSIS — E1165 Type 2 diabetes mellitus with hyperglycemia: Secondary | ICD-10-CM | POA: Diagnosis not present

## 2016-01-22 NOTE — Patient Instructions (Addendum)
Take 16 Novolog at lunch, 18 if getting more carbs  10 at supper unless no carbs  Check blood sugars on waking up  Every 2 days  Also check blood sugars about 2 hours after a meal and do this after different meals by rotation  Recommended blood sugar levels on waking up is 90-130 and about 2 hours after meal is 130-160  Please bring your blood sugar monitor to each visit, thank you

## 2016-01-22 NOTE — Progress Notes (Addendum)
Patient ID: Kimberly [name], female   DOB: 15-Jul-1950, 65 y.o.   MRN: [id number]           Reason for Appointment: follow-up for Type 2 Diabetes  History of Present Illness:          Date of diagnosis of type 2 diabetes mellitus: 1995        Background history:   For several years she had been tried on oral agents with variable control.  Detailed history not available She thinks that she had nausea and vomiting with metformin and could not take much Also had been tried on Amaryl She thinks she had swelling with Januvia and not clear what other medications she has tried before  She was started on insulin in 07/2013 when her A1c was over 11%  Recent history:   INSULIN regimen is: 38 Lantus hs   Novolog 18  units before lunch. Novolog 10 acs    Because of her poor control and A1c of 9.1 she was started on Invokana 100 mg daily in 12/16 Blood sugars improved significantly after adding Invokana 100 mg and she was able to reduce her Lantus  Her A1c is now consistently around 6.5  Current blood sugar patterns, management and problems identified:  Her blood sugars in the morning are usually fairly good except once when she forgot her insulin the night before  She is not checking her sugars consistently but has some readings after lunch and supper  Usually has fairly good readings after lunch including some  low normal readings without overt hypoglycemia  She does not understand again the need for covering her evening meal with mealtime insulin and she has not done it until a few days ago when she saw her readings are higher after supper, highest 225  She is still not understanding the need for adjusting her mealtime doses based on what she is planning to eat  She is often not eating much carbohydrate at lunchtime  She has not been able to exercise for various reasons but her weight is stable  Non-insulin hypoglycemic drugs the patient is taking are:  Invokana 100 mg daily      Side  effects from medications have been: Nausea from metformin,?  Swelling from Januvia  Compliance with the medical regimen: Good Hypoglycemia: Mild as above, none recently    Glucose monitoring:  done up to 3 times a day         Glucometer:  Contour       Blood Glucose readings by time of day and averages from meter download recently:  Mean values apply above for all meters except median for One Touch  PRE-MEAL Fasting Lunch Afternoon  PCS  Overall  Glucose range: 84-202   74-199  132-225    Mean/median: 133   124  176  141     Self-care: The diet that the patient has been following is: tries to limit fats, bread.     Meal times are:  Lunch: 1 pm Dinner: 5 PM  Typical meal intake: BreakfastIs oatmeal or grits, today cereal without milk, usually no protein              Dietician visit, most recent: 09/2015               Exercise:  walking 1-7/7 days a week  Weight history:  Wt Readings from Last 3 Encounters:  01/22/16 157 lb 6 oz (71.4 kg)  12/12/15 158 lb 6.4 oz (71.8 kg)  10/29/15 156 lb (70.8 kg)    Glycemic control:   Lab Results  Component Value Date   HGBA1C 6.5 01/17/2016   HGBA1C 6.5 10/12/2015   HGBA1C 6.6 (H) 07/17/2015   Lab Results  Component Value Date   MICROALBUR 0.9 04/24/2015   LDLCALC 69 07/17/2015   CREATININE 0.84 01/17/2016         Medication List       Accurate as of 01/22/16  1:11 PM. Always use your most recent med list.          albuterol 108 (90 Base) MCG/ACT inhaler Commonly known as:  PROVENTIL HFA;VENTOLIN HFA Inhale 2 puffs into the lungs every 6 (six) hours as needed. For shortness of breath or wheezing   aspirin 325 MG EC tablet Take 325 mg by mouth every morning.   atorvastatin 80 MG tablet Commonly known as:  LIPITOR TAKE 1 TABLET (80 MG TOTAL) BY MOUTH DAILY.   glucose blood test strip Commonly known as:  BAYER CONTOUR NEXT TEST TEST SUGAR UP TO 3 TO 4 TIMES A DAY TO TEST BLOOD SUGAR   insulin glargine 100 UNIT/ML  injection Commonly known as:  LANTUS Inject 38 Units into the skin at bedtime.   Insulin Pen Needle 32G X 4 MM Misc Commonly known as:  BD PEN NEEDLE NANO U/F USE WITH LEVEMIR FLEX PEN   INVOKANA 100 MG Tabs tablet Generic drug:  canagliflozin TAKE 1 TABLET BY MOUTH BEFORE BREAKFAST   lisinopril-hydrochlorothiazide 20-12.5 MG tablet Commonly known as:  PRINZIDE,ZESTORETIC Take 0.5 tablets by mouth daily.   NOVOLOG 100 UNIT/ML injection Generic drug:  insulin aspart Inject into the skin. 10 ml into skin in the morning and 18 into the skin at lunch       Allergies:  Allergies  Allergen Reactions  . Metformin And Related Nausea And Vomiting    After one pill at hospital  . Januvia [Sitagliptin Phosphate] Other (See Comments)    Swelling and edema see 4 13 OV    Past Medical History:  Diagnosis Date  . CAD (coronary artery disease)   . Colitis   . Colitis, indeterminate 01/31/2012  . COPD (chronic obstructive pulmonary disease) ([place])   . Diverticulitis 02/10/2012  . DM (diabetes mellitus) ([place])   . Glaucoma   . Glaucoma   . History of ventricular fibrillation   . HTN (hypertension)   . Myocardial infarction ([place])    12/1998, 01/2011  . Osteoporosis   . PVD (peripheral vascular disease) ([place])     Past Surgical History:  Procedure Laterality Date  . ABDOMINAL HYSTERECTOMY     endometriosis  . CABG x 3  01/22/2011   Dr Cyndia Bent  . CARDIAC CATHETERIZATION  01/17/2011   Dr Arvella Merles  . LIPOMA EXCISION Left 02/08/2015   Procedure: EXCISION OF LEFT THIGH LIPOMA;  Surgeon: Greer Pickerel, MD;  Location: [facility];  Service: General;  Laterality: Left;  . S/P aortobifemoral bypass  2008   Dr Donnetta Hutching  . S/P myocardial infarction and PCI of the lt circumflex  2000    Family History  Problem Relation Age of Onset  . Heart attack Father   . Diabetes Father   . Heart disease Father   . Diabetes Mother   . Liver cancer Mother   . Hypertension    . Thyroid disease        sisters- sister had parathyroidectomy  . Alcohol abuse      brother  . Colon polyps Maternal  Uncle   . Breast cancer Sister   . Diabetes Sister   . Diabetes Brother     Social History:  reports that she has been smoking Cigarettes.  She has been smoking about 0.40 packs per day. She has never used smokeless tobacco. She reports that she does not drink alcohol or use drugs.    Review of Systems    Lipid history:     Lab Results  Component Value Date   CHOL 134 07/17/2015   HDL 25.30 (L) 07/17/2015   LDLCALC 69 07/17/2015   LDLDIRECT 87.0 04/24/2015   TRIG 196.0 (H) 07/17/2015   CHOLHDL 5 07/17/2015           Hypertension: Present for several years Zestoretic  was reduced to half a tablet with starting Invokana and blood pressure is consistently good  She has had hypercalcemia since about 2010; PTH levels in the mid-upper range, slightly lower on this visit Calcium is Again upper normal  She is taking 1000 units of vitamin D since her last visit, Her last vitamin D level was 25 No history of fractures Her last bone density was normal except T score -2.3 at the wrist, no change in the bone density at the femur  Most recent eye exam was normal in 9/16  Most recent foot exam: 12/16  Review of Systems    LABS:  Lab on 01/17/2016  Component Date Value Ref Range Status  . Hgb A1c MFr Bld 01/17/2016 6.5  4.6 - 6.5 % Final  . Sodium 01/17/2016 138  135 - 145 mEq/L Final  . Potassium 01/17/2016 4.3  3.5 - 5.1 mEq/L Final  . Chloride 01/17/2016 101  96 - 112 mEq/L Final  . CO2 01/17/2016 27  19 - 32 mEq/L Final  . Glucose, Bld 01/17/2016 125* 70 - 99 mg/dL Final  . BUN 01/17/2016 21  6 - 23 mg/dL Final  . Creatinine, Ser 01/17/2016 0.84  0.40 - 1.20 mg/dL Final  . Total Bilirubin 01/17/2016 0.6  0.2 - 1.2 mg/dL Final  . Alkaline Phosphatase 01/17/2016 99  39 - 117 U/L Final  . AST 01/17/2016 18  0 - 37 U/L Final  . ALT 01/17/2016 18  0 - 35 U/L Final  . Total  Protein 01/17/2016 7.3  6.0 - 8.3 g/dL Final  . Albumin 01/17/2016 4.3  3.5 - 5.2 g/dL Final  . Calcium 01/17/2016 10.3  8.4 - 10.5 mg/dL Final  . GFR 01/17/2016 72.53  >60.00 mL/min Final  . VITD 01/17/2016 38.81  30.00 - 100.00 ng/mL Final    Physical Examination:  BP 122/64   Pulse 98   Ht 5\' 4"  (1.626 m)   Wt 157 lb 6 oz (71.4 kg)   SpO2 94%   BMI 27.01 kg/m      ASSESSMENT:  Diabetes type 2, uncontrolled  See history of present illness for detailed discussion of his current management, blood sugar patterns and problems identified  Her blood sugars at home are generally well controlled with A1c 6.5 Again Her blood sugars are relatively good fasting and after lunch Despite not eating a large meal at lunch she is still using 18 units of Novolog Not consistently doing Novolog at suppertime and only recently starting to do this Does not understand how to adjust NovoLog based on meal size and carbohydrates  HYPERCALCEMIA: Calcium is upper normal now and stable  HYPERTENSION:  well-controlled with current regimen   PLAN:    She will probably need to  reduce NovoLog 16 units at lunch, may take larger doses if eating out or eating a larger carbohydrate meal  If she is eating any carbohydrate at breakfast may need small dose of NovoLog at that time also  For now she can continue 10 units of Novolog at suppertime which she just started and reduce it if eating less carbohydrates  Since she is not comfortable morning to count carbohydrates will not do this as yet  More readings after supper  Restart walking regularly    Patient Instructions  Take 16 Novolog at lunch, 18 if getting more carbs  10 at supper unless no carbs  Check blood sugars on waking up  Every 2 days  Also check blood sugars about 2 hours after a meal and do this after different meals by rotation  Recommended blood sugar levels on waking up is 90-130 and about 2 hours after meal is 130-160  Please  bring your blood sugar monitor to each visit, thank you     Counseling time on subjects discussed above is over 50% of today's 25 minute visit  Kimberly [name] 01/22/2016, 1:11 PM   Note: This office note was prepared with Estate agent. Any transcriptional errors that result from this process are unintentional.

## 2016-01-23 NOTE — Telephone Encounter (Signed)
Sent to the pharmacy by e-scribe.  Pt has upcoming cpx on 10/31/16

## 2016-02-10 ENCOUNTER — Other Ambulatory Visit: Payer: Self-pay | Admitting: Internal Medicine

## 2016-02-12 ENCOUNTER — Other Ambulatory Visit: Payer: Self-pay | Admitting: Endocrinology

## 2016-03-23 ENCOUNTER — Other Ambulatory Visit: Payer: Self-pay | Admitting: Internal Medicine

## 2016-03-25 ENCOUNTER — Ambulatory Visit (INDEPENDENT_AMBULATORY_CARE_PROVIDER_SITE_OTHER): Payer: BLUE CROSS/BLUE SHIELD

## 2016-03-25 DIAGNOSIS — Z23 Encounter for immunization: Secondary | ICD-10-CM | POA: Diagnosis not present

## 2016-03-26 NOTE — Telephone Encounter (Signed)
Ok to refill x 6 months 

## 2016-04-01 ENCOUNTER — Other Ambulatory Visit: Payer: Self-pay | Admitting: Endocrinology

## 2016-04-18 ENCOUNTER — Other Ambulatory Visit (INDEPENDENT_AMBULATORY_CARE_PROVIDER_SITE_OTHER): Payer: BLUE CROSS/BLUE SHIELD

## 2016-04-18 DIAGNOSIS — Z794 Long term (current) use of insulin: Secondary | ICD-10-CM

## 2016-04-18 DIAGNOSIS — E1165 Type 2 diabetes mellitus with hyperglycemia: Secondary | ICD-10-CM

## 2016-04-18 LAB — BASIC METABOLIC PANEL
BUN: 21 mg/dL (ref 6–23)
CHLORIDE: 103 meq/L (ref 96–112)
CO2: 26 meq/L (ref 19–32)
Calcium: 10.1 mg/dL (ref 8.4–10.5)
Creatinine, Ser: 0.79 mg/dL (ref 0.40–1.20)
GFR: 77.8 mL/min (ref >60.00)
Glucose, Bld: 147 mg/dL — ABNORMAL HIGH (ref 70–99)
POTASSIUM: 4.9 meq/L (ref 3.5–5.1)
Sodium: 136 mEq/L (ref 135–145)

## 2016-04-18 LAB — HEMOGLOBIN A1C: HEMOGLOBIN A1C: 6.5 % (ref 4.6–6.5)

## 2016-04-18 LAB — MICROALBUMIN / CREATININE URINE RATIO
CREATININE, U: 69.5 mg/dL
Microalb Creat Ratio: 1 mg/g (ref 0.0–30.0)

## 2016-04-23 ENCOUNTER — Ambulatory Visit (INDEPENDENT_AMBULATORY_CARE_PROVIDER_SITE_OTHER): Payer: BLUE CROSS/BLUE SHIELD | Admitting: Endocrinology

## 2016-04-23 ENCOUNTER — Encounter: Payer: Self-pay | Admitting: Endocrinology

## 2016-04-23 VITALS — BP 120/70 | HR 103 | Ht 64.0 in | Wt 163.0 lb

## 2016-04-23 DIAGNOSIS — Z794 Long term (current) use of insulin: Secondary | ICD-10-CM

## 2016-04-23 DIAGNOSIS — E1165 Type 2 diabetes mellitus with hyperglycemia: Secondary | ICD-10-CM

## 2016-04-23 NOTE — Progress Notes (Signed)
Patient ID: Kimberly [name], female   DOB: 1951-02-17, 66 y.o.   MRN: [id number]           Reason for Appointment: follow-up for Type 2 Diabetes  History of Present Illness:          Date of diagnosis of type 2 diabetes mellitus: 1995        Background history:   For several years she had been tried on oral agents with variable control.  Detailed history not available She thinks that she had nausea and vomiting with metformin and could not take much Also had been tried on Amaryl She thinks she had swelling with Januvia and not clear what other medications she has tried before  She was started on insulin in 07/2013 when her A1c was over 11%  Recent history:   INSULIN regimen is: 38 Lantus hs   Novolog 18  units before lunch. Novolog 10 acs    Because of her poor control and A1c of 9.1 she was started on Invokana 100 mg daily in 12/16 Blood sugars improved significantly after adding Invokana 100 mg and she was able to reduce her Lantus   Her A1c is now consistently  6.5  Current blood sugar patterns, management and problems identified:  Her blood sugars in the morning are usually fairly good but is not checking very many readings  She was told to reduce her lunchtime dose on the last visit by 2 units because of low-normal readings in the afternoons but she is still the same dose.  No hypoglycemia however in the afternoon  She does tend to have higher readings after her evening meal periodically except once when she forgot her insulin the night before  She is not checking her sugars consistently but has some readings after lunch and supper  Usually has fairly good readings after lunch including some  low normal readings without overt hypoglycemia  She does not take adequate insulin to cover her evening meal frequently and blood sugars are the highest after supper  She also has not understood the concept of carbohydrates, had a lot of beans and peas at dinner and glucose was  higher  No hypoglycemia at any time She has gained weight from not doing her exercises or walking and may not be consistent with diet also  Non-insulin hypoglycemic drugs the patient is taking are:  Invokana 100 mg daily      Side effects from medications have been: Nausea from metformin,?  Swelling from Januvia  Compliance with the medical regimen: Good Hypoglycemia: None  Glucose monitoring:  done up to 3 times a day         Glucometer:  Contour       Blood Glucose readings by time of day and averages from meter download recently:  Mean values apply above for all meters except median for One Touch  PRE-MEAL Fasting Lunch Dinner Bedtime Overall  Glucose range: 127-151  130      Mean/median: 137     154    POST-MEAL PC Breakfast PC Lunch PC Dinner  Glucose range:  111-213  170   Mean/median:  142  124-225     Self-care: The diet that the patient has been following is: tries to limit fats, bread.     Meal times are:  Lunch: 1 pm Dinner: 5 PM  Typical meal intake: Breakfast is oatmeal or grits             Dietician visit, most recent:  09/2015               Exercise:  walking 0//7 days a week  Weight history:  Wt Readings from Last 3 Encounters:  04/23/16 163 lb (73.9 kg)  01/22/16 157 lb 6 oz (71.4 kg)  12/12/15 158 lb 6.4 oz (71.8 kg)    Glycemic control:   Lab Results  Component Value Date   HGBA1C 6.5 04/18/2016   HGBA1C 6.5 01/17/2016   HGBA1C 6.5 10/12/2015   Lab Results  Component Value Date   MICROALBUR <0.7 04/18/2016   LDLCALC 69 07/17/2015   CREATININE 0.79 04/18/2016         Medication List       Accurate as of 04/23/16  1:36 PM. Always use your most recent med list.          albuterol 108 (90 Base) MCG/ACT inhaler Commonly known as:  PROVENTIL HFA;VENTOLIN HFA Inhale 2 puffs into the lungs every 6 (six) hours as needed. For shortness of breath or wheezing   aspirin 325 MG EC tablet Take 325 mg by mouth every morning.   atorvastatin 80  MG tablet Commonly known as:  LIPITOR TAKE 1 TABLET BY MOUTH EVERY DAY   glucose blood test strip Commonly known as:  BAYER CONTOUR NEXT TEST TEST SUGAR UP TO 3 TO 4 TIMES A DAY TO TEST BLOOD SUGAR   insulin glargine 100 UNIT/ML injection Commonly known as:  LANTUS Inject 38 Units into the skin at bedtime.   LANTUS SOLOSTAR 100 UNIT/ML Solostar Pen Generic drug:  Insulin Glargine INJECT 37 UNITS UNDER THE SKIN AT BEDTIME   Insulin Pen Needle 32G X 4 MM Misc Commonly known as:  BD PEN NEEDLE NANO U/F USE WITH LEVEMIR FLEX PEN   INVOKANA 100 MG Tabs tablet Generic drug:  canagliflozin TAKE 1 TABLET BY MOUTH BEFORE BREAKFAST   lisinopril-hydrochlorothiazide 20-12.5 MG tablet Commonly known as:  PRINZIDE,ZESTORETIC TAKE 1 TABLET BY MOUTH EVERY DAY   NOVOLOG 100 UNIT/ML injection Generic drug:  insulin aspart Inject into the skin. 10 ml into skin in the morning and 18 into the skin at lunch   NOVOLOG FLEXPEN 100 UNIT/ML FlexPen Generic drug:  insulin aspart INJECT 20 UNITS INTO THE SKIN FOR 1 DOSE WITH LARGEST MEAL, THEN USE AS DIRECTED       Allergies:  Allergies  Allergen Reactions  . Metformin And Related Nausea And Vomiting    After one pill at hospital  . Januvia [Sitagliptin Phosphate] Other (See Comments)    Swelling and edema see 4 13 OV    Past Medical History:  Diagnosis Date  . CAD (coronary artery disease)   . Colitis   . Colitis, indeterminate 01/31/2012  . COPD (chronic obstructive pulmonary disease) ([place])   . Diverticulitis 02/10/2012  . DM (diabetes mellitus) ([place])   . Glaucoma   . Glaucoma   . History of ventricular fibrillation   . HTN (hypertension)   . Myocardial infarction    12/1998, 01/2011  . Osteoporosis   . PVD (peripheral vascular disease) ([place])     Past Surgical History:  Procedure Laterality Date  . ABDOMINAL HYSTERECTOMY     endometriosis  . CABG x 3  01/22/2011   Dr Cyndia Bent  . CARDIAC CATHETERIZATION  01/17/2011   Dr Arvella Merles  .  LIPOMA EXCISION Left 02/08/2015   Procedure: EXCISION OF LEFT THIGH LIPOMA;  Surgeon: Greer Pickerel, MD;  Location: [facility];  Service: General;  Laterality: Left;  .  S/P aortobifemoral bypass  2008   Dr Donnetta Hutching  . S/P myocardial infarction and PCI of the lt circumflex  2000    Family History  Problem Relation Age of Onset  . Heart attack Father   . Diabetes Father   . Heart disease Father   . Diabetes Mother   . Liver cancer Mother   . Hypertension    . Thyroid disease      sisters- sister had parathyroidectomy  . Alcohol abuse      brother  . Colon polyps Maternal Uncle   . Breast cancer Sister   . Diabetes Sister   . Diabetes Brother     Social History:  reports that she has been smoking Cigarettes.  She has been smoking about 0.40 packs per day. She has never used smokeless tobacco. She reports that she does not drink alcohol or use drugs.    Review of Systems    Lipid history:     Lab Results  Component Value Date   CHOL 134 07/17/2015   HDL 25.30 (L) 07/17/2015   LDLCALC 69 07/17/2015   LDLDIRECT 87.0 04/24/2015   TRIG 196.0 (H) 07/17/2015   CHOLHDL 5 07/17/2015           Hypertension: Present for several years Zestoretic  was reduced to half a tablet with starting Invokana and blood pressure is  good  She has had Mild hypercalcemia since about 2010; PTH levels in the mid-upper range Calcium is upper normal more recently  She is taking 1000 units of vitamin D, Her last vitamin D level was 38 No history of fractures  Her last bone density was normal in 07/2015 except T score -2.3 at the wrist, no change in the bone density at the femur  Most recent eye exam was normal in 9/16  Most recent foot exam: 12/16  Review of Systems    LABS:  Lab on 04/18/2016  Component Date Value Ref Range Status  . Hgb A1c MFr Bld 04/18/2016 6.5  4.6 - 6.5 % Final  . Sodium 04/18/2016 136  135 - 145 mEq/L Final  . Potassium 04/18/2016 4.9  3.5 - 5.1 mEq/L  Final  . Chloride 04/18/2016 103  96 - 112 mEq/L Final  . CO2 04/18/2016 26  19 - 32 mEq/L Final  . Glucose, Bld 04/18/2016 147* 70 - 99 mg/dL Final  . BUN 04/18/2016 21  6 - 23 mg/dL Final  . Creatinine, Ser 04/18/2016 0.79  0.40 - 1.20 mg/dL Final  . Calcium 04/18/2016 10.1  8.4 - 10.5 mg/dL Final  . GFR 04/18/2016 77.80  >60.00 mL/min Final  . Microalb, Ur 04/18/2016 <0.7  0.0 - 1.9 mg/dL Final  . Creatinine,U 04/18/2016 69.5  mg/dL Final  . Microalb Creat Ratio 04/18/2016 1.0  0.0 - 30.0 mg/g Final    Physical Examination:  BP 120/70   Pulse (!) 103   Ht 5\' 4"  (1.626 m)   Wt 163 lb (73.9 kg)   SpO2 97%   BMI 27.98 kg/m      ASSESSMENT:  Diabetes type 2, BMI 28  See history of present illness for detailed discussion of his current management, blood sugar patterns and problems identified  Her blood sugars at home are generally well controlled with A1c 6.5 consistently Her blood sugars are relatively good fasting and after lunch Tends to have higher readings after supper and does not adjust her insulin based on carbohydrate intake Also has had some weight gain, needs to do  better with diet and exercise overall She is reluctant to see the dietitian  HYPERCALCEMIA: Calcium is upper normal  and stable  HYPERTENSION:  well-controlled with current regimen   PLAN:    She will try 12 units at suppertime of the NovoLog  She may need to reduce lunchtime dose by 2 units if eating smaller or low carbohydrate meals  Restart walking regularly  Bone density in early 2019 to follow-up osteopenia  Patient Instructions  Check blood sugars on waking up  3x per week  Also check blood sugars about 2 hours after a meal and do this after different meals by rotation  Recommended blood sugar levels on waking up is 90-130 and about 2 hours after meal is 130-160  Please bring your blood sugar monitor to each visit, thank you  12 Novolog at supper unless low carb  meal    Kimberly [name] 04/23/2016, 1:36 PM   Note: This office note was prepared with Estate agent. Any transcriptional errors that result from this process are unintentional.

## 2016-04-23 NOTE — Patient Instructions (Signed)
Check blood sugars on waking up  3x per week  Also check blood sugars about 2 hours after a meal and do this after different meals by rotation  Recommended blood sugar levels on waking up is 90-130 and about 2 hours after meal is 130-160  Please bring your blood sugar monitor to each visit, thank you  12 Novolog at supper unless low carb meal

## 2016-05-07 ENCOUNTER — Other Ambulatory Visit: Payer: Self-pay | Admitting: Endocrinology

## 2016-05-30 ENCOUNTER — Ambulatory Visit (INDEPENDENT_AMBULATORY_CARE_PROVIDER_SITE_OTHER): Payer: BLUE CROSS/BLUE SHIELD | Admitting: Family Medicine

## 2016-05-30 ENCOUNTER — Encounter: Payer: Self-pay | Admitting: Family Medicine

## 2016-05-30 VITALS — BP 116/88 | HR 101 | Temp 98.0°F | Ht 64.0 in | Wt 162.0 lb

## 2016-05-30 DIAGNOSIS — F418 Other specified anxiety disorders: Secondary | ICD-10-CM | POA: Diagnosis not present

## 2016-05-30 HISTORY — DX: Other specified anxiety disorders: F41.8

## 2016-05-30 MED ORDER — LORAZEPAM 1 MG PO TABS: 1.0000 mg | ORAL_TABLET | Freq: Three times a day (TID) | ORAL | 0 refills | Status: DC | PRN

## 2016-05-30 MED ORDER — SERTRALINE HCL 50 MG PO TABS
50.0000 mg | ORAL_TABLET | Freq: Every day | ORAL | 0 refills | Status: DC
Start: 2016-05-30 — End: 2016-06-26

## 2016-05-30 NOTE — Progress Notes (Signed)
   Subjective:    Patient ID: Kimberly [name], female    DOB: 01/24/1951, 65 y.o.   MRN: [id number]  HPI Here asking for help with depression. She says that around Thanksgiving she began to feel sad, hopeless, and unmotivated, and she has had frequent crying spells. She wants to sleep 11 hours a night and she finds it hard to get out of bed. Her appetite is poor. She also get very nervous at times and worries about things. She denies any suicidal thoughts. She was treated for similar symptoms briefly in 09-17-13 after the death of her husband, and this was helpful to her. I cannot find what this medication was however.    Review of Systems  Constitutional: Negative.   Respiratory: Negative.   Cardiovascular: Negative.   Neurological: Negative.   Psychiatric/Behavioral: Positive for decreased concentration, dysphoric mood and sleep disturbance. Negative for agitation, behavioral problems, confusion, hallucinations, self-injury and suicidal ideas. The patient is nervous/anxious.        Objective:   Physical Exam  Constitutional: She is oriented to person, place, and time. She appears well-developed and well-nourished.  Cardiovascular: Normal rate, regular rhythm, normal heart sounds and intact distal pulses.   Pulmonary/Chest: Effort normal and breath sounds normal.  Neurological: She is alert and oriented to person, place, and time.  Psychiatric: Her behavior is normal. Judgment and thought content normal.  Depressed affect, tearful           Assessment & Plan:  She has depression with some anxious features, and I suspect the holidays are difficult for her after the loss of her husband. She will start on Zoloft 50 mg daily and she can add Ativan as needed. I recommended she meet with a therapist as well, and she agreed, but she would rather pursue this after the first of the year. She will follow up with Dr. Regis Bill, her PCP, in 2 weeks. We spent a total of 45 minutes today discussing these  issues.  Laurey Morale, MD

## 2016-06-05 ENCOUNTER — Other Ambulatory Visit: Payer: Self-pay | Admitting: Endocrinology

## 2016-06-13 ENCOUNTER — Ambulatory Visit: Payer: BLUE CROSS/BLUE SHIELD | Admitting: Internal Medicine

## 2016-06-25 NOTE — Progress Notes (Signed)
Pre visit review using our clinic review tool, if applicable. No additional management support is needed unless otherwise documented below in the visit note.  Chief Complaint  Patient presents with  . Follow-up    HPI: Kimberly [name] 66 y.o.  Seen 12 41  By Dr Sarajane Jews in my absence    For depression and anxiety with  Hx multiple losses  And ho;idays  since last time she has taken 50 mg sertraline qhs and ativan only 3 times  At holidays.   since last time  No longer crying all the time and less irritable withy others   Otherwise  good days and bad days . Feels better on sunny days .  Has stopped walking and  her hobbies and  Inc her tobacco but now cutting down again .  Family is supportive.  Not to interested in more medication .   She has depression with some anxious features, and I suspect the holidays are difficult for her after the loss of her husband. She will start on Zoloft 50 mg daily and she can add Ativan as needed. I recommended she meet with a therapist as well, and she agreed, but she would rather pursue this after the first of the year. She will follow up with Dr. Regis Bill, her PCP, in 2 weeks. We spent a total of 45 minutes today discussing these issues.  FRY,STEPHEN A, MD ROS: See pertinent positives and negatives per HPI.  Past Medical History:  Diagnosis Date  . CAD (coronary artery disease)   . Colitis   . Colitis, indeterminate 01/31/2012  . COPD (chronic obstructive pulmonary disease) ([place])   . Diverticulitis 02/10/2012  . DM (diabetes mellitus) ([place])   . Glaucoma   . Glaucoma   . History of ventricular fibrillation   . HTN (hypertension)   . Myocardial infarction    12/1998, 01/2011  . Osteoporosis   . PVD (peripheral vascular disease) ([facility])     Family History  Problem Relation Age of Onset  . Heart attack Father   . Diabetes Father   . Heart disease Father   . Diabetes Mother   . Liver cancer Mother   . Hypertension    . Thyroid disease      sisters-  sister had parathyroidectomy  . Alcohol abuse      brother  . Colon polyps Maternal Uncle   . Breast cancer Sister   . Diabetes Sister   . Diabetes Brother     Social History   Social History  . Marital status: Married    Spouse name: N/A  . Number of children: 3  . Years of education: N/A   Occupational History  . housewife Unemployed   Social History Main Topics  . Smoking status: Current Every Day Smoker    Packs/day: 0.40    Types: Cigarettes  . Smokeless tobacco: Never Used  . Alcohol use No  . Drug use: No  . Sexual activity: Not Asked   Other Topics Concern  . None   Social History Narrative   Married just widowed    Regular exercise- yes not recently       Quit tobacco in August at heart surgery. Restarted    Husband diagnosed with liver cancer since 2014 passed April 2015    She was care taker.   No pets    Outpatient Medications Prior to Visit  Medication Sig Dispense Refill  . albuterol (PROVENTIL HFA;VENTOLIN HFA) 108 (90 BASE) MCG/ACT inhaler Inhale  2 puffs into the lungs every 6 (six) hours as needed. For shortness of breath or wheezing 1 Inhaler 2  . aspirin 325 MG EC tablet Take 325 mg by mouth every morning.     Marland Kitchen atorvastatin (LIPITOR) 80 MG tablet TAKE 1 TABLET BY MOUTH EVERY DAY 90 tablet 2  . glucose blood (BAYER CONTOUR NEXT TEST) test strip TEST SUGAR UP TO 3 TO 4 TIMES A DAY TO TEST BLOOD SUGAR 100 each 11  . Insulin Pen Needle (BD PEN NEEDLE NANO U/F) 32G X 4 MM MISC USE WITH LEVEMIR FLEX PEN 100 each 5  . INVOKANA 100 MG TABS tablet TAKE 1 TABLET BY MOUTH BEFORE BREAKFAST 30 tablet 0  . LANTUS SOLOSTAR 100 UNIT/ML Solostar Pen INJECT 37 UNITS UNDER THE SKIN AT BEDTIME (Patient taking differently: INJECT 38 UNITS UNDER THE SKIN AT BEDTIME) 15 mL 3  . lisinopril-hydrochlorothiazide (PRINZIDE,ZESTORETIC) 20-12.5 MG tablet TAKE 1 TABLET BY MOUTH EVERY DAY 90 tablet 1  . LORazepam (ATIVAN) 1 MG tablet Take 1 tablet (1 mg total) by mouth every 8  (eight) hours as needed for anxiety. 60 tablet 0  . NOVOLOG FLEXPEN 100 UNIT/ML FlexPen INJECT 20 UNITS INTO THE SKIN FOR 1 DOSE WITH LARGEST MEAL, THEN USE AS DIRECTED 15 mL 2  . sertraline (ZOLOFT) 50 MG tablet Take 1 tablet (50 mg total) by mouth daily. 30 tablet 0   No facility-administered medications prior to visit.      EXAM:  BP 114/78 (BP Location: Left Arm, Patient Position: Sitting, Cuff Size: Normal)   Temp 98.2 F (36.8 C) (Oral)   Wt 161 lb (73 kg)   BMI 27.64 kg/m   Body mass index is 27.64 kg/m.  GENERAL: vitals reviewed and listed above, alert, oriented, appears well hydrated and in no acute distress HEENT: atraumatic, conjunctiva  clear, no obvious abnormalities on inspection of external nose and ears  NECK: no obvious masses on inspection palpation  LUNGS: clear to auscultation bilaterally, no wheezes, rales or rhonchi,  CV: HRRR, no clubbing cyanosis or  peripheral edema nl cap refill  syst m short lusb MS: moves all extremities PSYCH: pleasant and cooperative,  Nl affect   Wt Readings from Last 3 Encounters:  06/26/16 161 lb (73 kg)  05/30/16 162 lb (73.5 kg)  04/23/16 163 lb (73.9 kg)   BP Readings from Last 3 Encounters:  06/26/16 114/78  05/30/16 116/88  04/23/16 120/70   PHQ-SADS Somatic:3 sleep GAD7:3 no panic PHQ9:10 sleeping down somewhat difficult Difficulty :somewhat  .lastwe ASSESSMENT AND PLAN:  Discussed the following assessment and plan:  Depression with anxiety  Medication management May have some light response. Discussed this as a friend has a light therapy lamb. She can consider trying this not interested in adjusting medicine ward this time. We'll give this more time follow-up in 2-3 months or alarm symptoms. She is interested in getting off medicines quickly as possible but is aware should not do this without advice. Total visit 47mins > 50% spent counseling and coordinating care as indicated in above note and in instructions  to patient .  Strategies  To continue and med    -Patient advised to return or notify health care team  if symptoms worsen ,persist or new concerns arise.  Patient Instructions  Continue the sertraline every day and sending in a 3 month refill. Continue getting out day light walking mild help with depression. Explore using full-spectrum light therapy in the morning or midday as we discussed.  This does help some people with depression. Do not go off the sertraline without talking about the plan.  Taken from   [employer] ?Device - The standard and best studied devices for administering bright light therapy are 10,000 lux light boxes that use fluorescent bulbs emitting white light. (Incandescent light poses risks to the cornea and retina.) Although light boxes emitting less than 10,000 lux can be used, longer exposures are required. Commercially available fixtures are recommended over homemade devices, due to difficulty in measuring light intensity, and to reduce electrical hazards and other risks (eg, corneal and eyelid burns) associated with poor-quality construction. In addition, patients are advised to seek light boxes designed to protect the eyes with features such as light dispersion and screens that filter out ultraviolet rays. Ultraviolet light is not necessary for the therapeutic effect of bright light therapy and should be avoided to reduce potential risks to the skin or eyes.   ?Positioning and distance - The light box should be positioned at a distance that enables patients to receive 10,000 lux while seated and facing the box, with the light projected downward to minimize aversive glare. Commercial light box instructions should give the distance at which 10,000 lux is achieved, which is typically approximately 40 to 80 cm (16 to 31 inches). The distance from the eyes to the light box is important because light intensity follows the inverse square law; if the distance between the eye and  the light source is doubled, the intensity of light that is received drops to one-quarter of the original intensity. ?Time of day - Bright light therapy generally commences in the early morning, soon after awakening (eg, 7:00 AM). Patients should administer light therapy at approximately the same time each day, including weekends, holidays, and vacations. Most light therapy studies required a regular time for light treatment to start and thus stipulated a regular wake-up time for the subjects. A regular wake-up time may be important for optimal effectiveness of the bright light treatment. The effectiveness of variable timing of the bright light therapy is unknown. If morning bright light treatment alone is not fully effective after two to four weeks of treatment, adding evening (eg, 8:00 PM) bright light treatment may be helpful. A minority of patients with SAD may benefit from bright light in the evening rather than morning bright light [81]. The self-report version of the Morningness-Eveningness Questionnaire is in the public domain and can be used to determine the best time of day to commence bright light therapy. However, the questionnaire is generally used for research rather than standard clinical care. ?Duration of exposure - The duration of early morning exposure to standard light boxes emitting 10,000 lux is generally 30 minutes/day, but some studies have used 45 or 60 minutes per session. However, no head-to-head trials have compared the efficacy of different lengths of exposure.      Standley Brooking. Fermin Yan M.D.

## 2016-06-26 ENCOUNTER — Encounter: Payer: Self-pay | Admitting: Internal Medicine

## 2016-06-26 ENCOUNTER — Ambulatory Visit (INDEPENDENT_AMBULATORY_CARE_PROVIDER_SITE_OTHER): Payer: BLUE CROSS/BLUE SHIELD | Admitting: Internal Medicine

## 2016-06-26 VITALS — BP 114/78 | Temp 98.2°F | Wt 161.0 lb

## 2016-06-26 DIAGNOSIS — F418 Other specified anxiety disorders: Secondary | ICD-10-CM

## 2016-06-26 DIAGNOSIS — Z79899 Other long term (current) drug therapy: Secondary | ICD-10-CM

## 2016-06-26 MED ORDER — SERTRALINE HCL 50 MG PO TABS: 50.0000 mg | ORAL_TABLET | Freq: Every day | ORAL | 1 refills | Status: DC

## 2016-06-26 NOTE — Patient Instructions (Addendum)
Continue the sertraline every day and sending in a 3 month refill. Continue getting out day light walking mild help with depression. Explore using full-spectrum light therapy in the morning or midday as we discussed. This does help some people with depression. Do not go off the sertraline without talking about the plan.  Taken from   [employer] ?Device - The standard and best studied devices for administering bright light therapy are 10,000 lux light boxes that use fluorescent bulbs emitting white light. (Incandescent light poses risks to the cornea and retina.) Although light boxes emitting less than 10,000 lux can be used, longer exposures are required. Commercially available fixtures are recommended over homemade devices, due to difficulty in measuring light intensity, and to reduce electrical hazards and other risks (eg, corneal and eyelid burns) associated with poor-quality construction. In addition, patients are advised to seek light boxes designed to protect the eyes with features such as light dispersion and screens that filter out ultraviolet rays. Ultraviolet light is not necessary for the therapeutic effect of bright light therapy and should be avoided to reduce potential risks to the skin or eyes.   ?Positioning and distance - The light box should be positioned at a distance that enables patients to receive 10,000 lux while seated and facing the box, with the light projected downward to minimize aversive glare. Commercial light box instructions should give the distance at which 10,000 lux is achieved, which is typically approximately 40 to 80 cm (16 to 31 inches). The distance from the eyes to the light box is important because light intensity follows the inverse square law; if the distance between the eye and the light source is doubled, the intensity of light that is received drops to one-quarter of the original intensity. ?Time of day - Bright light therapy generally commences in the early  morning, soon after awakening (eg, 7:00 AM). Patients should administer light therapy at approximately the same time each day, including weekends, holidays, and vacations. Most light therapy studies required a regular time for light treatment to start and thus stipulated a regular wake-up time for the subjects. A regular wake-up time may be important for optimal effectiveness of the bright light treatment. The effectiveness of variable timing of the bright light therapy is unknown. If morning bright light treatment alone is not fully effective after two to four weeks of treatment, adding evening (eg, 8:00 PM) bright light treatment may be helpful. A minority of patients with SAD may benefit from bright light in the evening rather than morning bright light [81]. The self-report version of the Morningness-Eveningness Questionnaire is in the public domain and can be used to determine the best time of day to commence bright light therapy. However, the questionnaire is generally used for research rather than standard clinical care. ?Duration of exposure - The duration of early morning exposure to standard light boxes emitting 10,000 lux is generally 30 minutes/day, but some studies have used 45 or 60 minutes per session. However, no head-to-head trials have compared the efficacy of different lengths of exposure.

## 2016-07-03 ENCOUNTER — Other Ambulatory Visit: Payer: Self-pay | Admitting: Endocrinology

## 2016-07-18 ENCOUNTER — Other Ambulatory Visit: Payer: Self-pay | Admitting: Endocrinology

## 2016-07-21 ENCOUNTER — Other Ambulatory Visit (INDEPENDENT_AMBULATORY_CARE_PROVIDER_SITE_OTHER): Payer: BLUE CROSS/BLUE SHIELD

## 2016-07-21 DIAGNOSIS — E1165 Type 2 diabetes mellitus with hyperglycemia: Secondary | ICD-10-CM | POA: Diagnosis not present

## 2016-07-21 DIAGNOSIS — Z794 Long term (current) use of insulin: Secondary | ICD-10-CM | POA: Diagnosis not present

## 2016-07-21 LAB — BASIC METABOLIC PANEL
BUN: 19 mg/dL (ref 6–23)
CALCIUM: 10.1 mg/dL (ref 8.4–10.5)
CO2: 26 mEq/L (ref 19–32)
Chloride: 103 mEq/L (ref 96–112)
Creatinine, Ser: 0.79 mg/dL (ref 0.40–1.20)
GFR: 77.73 mL/min (ref >60.00)
Glucose, Bld: 164 mg/dL — ABNORMAL HIGH (ref 70–99)
POTASSIUM: 4.6 meq/L (ref 3.5–5.1)
SODIUM: 135 meq/L (ref 135–145)

## 2016-07-21 LAB — LIPID PANEL
CHOLESTEROL: 132 mg/dL (ref 0–200)
HDL: 23.5 mg/dL — AB (ref >39.00)
LDL Cholesterol: 72 mg/dL (ref 0–99)
NonHDL: 108.21
TRIGLYCERIDES: 183 mg/dL — AB (ref 0.0–149.0)
Total CHOL/HDL Ratio: 6
VLDL: 36.6 mg/dL (ref 0.0–40.0)

## 2016-07-21 LAB — HEMOGLOBIN A1C: HEMOGLOBIN A1C: 6.6 % — AB (ref 4.6–6.5)

## 2016-07-24 ENCOUNTER — Ambulatory Visit (INDEPENDENT_AMBULATORY_CARE_PROVIDER_SITE_OTHER): Payer: BLUE CROSS/BLUE SHIELD | Admitting: Endocrinology

## 2016-07-24 ENCOUNTER — Encounter: Payer: Self-pay | Admitting: Endocrinology

## 2016-07-24 VITALS — BP 108/62 | HR 85 | Temp 97.7°F | Resp 16 | Ht 64.0 in | Wt 160.2 lb

## 2016-07-24 DIAGNOSIS — Z794 Long term (current) use of insulin: Secondary | ICD-10-CM

## 2016-07-24 DIAGNOSIS — E1165 Type 2 diabetes mellitus with hyperglycemia: Secondary | ICD-10-CM

## 2016-07-24 NOTE — Patient Instructions (Signed)
If am stays >130 go to 38 Lantus

## 2016-07-24 NOTE — Progress Notes (Signed)
Patient ID: Kimberly [name], female   DOB: 09-Mar-1951, 66 y.o.   MRN: [id number]           Reason for Appointment: follow-up for Type 2 Diabetes  History of Present Illness:          Date of diagnosis of type 2 diabetes mellitus: 1995        Background history:   For several years she had been tried on oral agents with variable control.  Detailed history not available She thinks that she had nausea and vomiting with metformin and could not take much Also had been tried on Amaryl She thinks she had swelling with Januvia and not clear what other medications she has tried before  She was started on insulin in 07/2013 when her A1c was over 11%  Recent history:   INSULIN regimen is: 36 Lantus hs   Novolog 18  units before lunch. Novolog 8 acs    Because of her poor control and A1c of 9.1 she was started on Invokana 100 mg daily in 12/16 Blood sugars improved significantly after adding Invokana 100 mg and she was able to reduce her Lantus   Her A1c is now consistently around 6.5  Current blood sugar patterns, management and problems identified:  She cut back her suppertime Novolog and bedtime Lantus by 2 units on her own because of fear of hypoglycemia  She is however very consistent with checking her blood sugar by rotation at different times including after lunch and supper  Most of her blood sugars are fairly good  She tends to have periodic high readings which she thinks is from eating more food or snacks because of depression at times  However has not gained any more weight.  She is trying to do more walking than before  Her blood sugars in the morning are usually a little high but not consistently  She is only getting 0-calorie snacks like celery sticks at bedtime  No hypoglycemia at any time  Non-insulin hypoglycemic drugs the patient is taking are:  Invokana 100 mg daily      Side effects from medications have been: Nausea from metformin,?  Swelling from  Januvia  Compliance with the medical regimen: Good Hypoglycemia: None  Glucose monitoring:  done up to 3 times a day         Glucometer:  Contour       Blood Glucose readings by time of day and averages from meter download recently:  Mean values apply above for all meters except median for One Touch  PRE-MEAL Fasting Lunch Dinner Bedtime Overall  Glucose range: 109-180       Mean/median: 144    142    POST-MEAL PC Breakfast PC Lunch PC Dinner  Glucose range:  119-183  123-177   Mean/median:  153  153      Self-care: The diet that the patient has been following is: tries to limit fats, bread.     Meal times are:  Lunch: 1 pm Dinner: 4-5 PM  Typical meal intake: Breakfast is oatmeal or grits             Dietician visit, most recent: 09/2015               Exercise:  walking 3/7 days a week  Weight history:  Wt Readings from Last 3 Encounters:  07/24/16 160 lb 3.2 oz (72.7 kg)  06/26/16 161 lb (73 kg)  05/30/16 162 lb (73.5 kg)    Glycemic  control:   Lab Results  Component Value Date   HGBA1C 6.6 (H) 07/21/2016   HGBA1C 6.5 04/18/2016   HGBA1C 6.5 01/17/2016   Lab Results  Component Value Date   MICROALBUR <0.7 04/18/2016   LDLCALC 72 07/21/2016   CREATININE 0.79 07/21/2016       Allergies as of 07/24/2016      Reactions   Metformin And Related Nausea And Vomiting   After one pill at hospital   Januvia [sitagliptin Phosphate] Other (See Comments)   Swelling and edema see 4 13 OV      Medication List       Accurate as of 07/24/16  1:22 PM. Always use your most recent med list.          albuterol 108 (90 Base) MCG/ACT inhaler Commonly known as:  PROVENTIL HFA;VENTOLIN HFA Inhale 2 puffs into the lungs every 6 (six) hours as needed. For shortness of breath or wheezing   aspirin 325 MG EC tablet Take 325 mg by mouth every morning.   atorvastatin 80 MG tablet Commonly known as:  LIPITOR TAKE 1 TABLET BY MOUTH EVERY DAY   glucose blood test  strip Commonly known as:  BAYER CONTOUR NEXT TEST TEST SUGAR UP TO 3 TO 4 TIMES A DAY TO TEST BLOOD SUGAR   Insulin Pen Needle 32G X 4 MM Misc Commonly known as:  BD PEN NEEDLE NANO U/F USE WITH LEVEMIR FLEX PEN   INVOKANA 100 MG Tabs tablet Generic drug:  canagliflozin TAKE 1 TABLET BY MOUTH BEFORE BREAKFAST   LANTUS SOLOSTAR 100 UNIT/ML Solostar Pen Generic drug:  Insulin Glargine INJECT 37 UNITS UNDER THE SKIN AT BEDTIME   lisinopril-hydrochlorothiazide 20-12.5 MG tablet Commonly known as:  PRINZIDE,ZESTORETIC TAKE 1 TABLET BY MOUTH EVERY DAY   LORazepam 1 MG tablet Commonly known as:  ATIVAN Take 1 tablet (1 mg total) by mouth every 8 (eight) hours as needed for anxiety.   NOVOLOG FLEXPEN 100 UNIT/ML FlexPen Generic drug:  insulin aspart INJECT 20 UNITS INTO THE SKIN FOR 1 DOSE WITH LARGEST MEAL, THEN USE AS DIRECTED   sertraline 50 MG tablet Commonly known as:  ZOLOFT Take 1 tablet (50 mg total) by mouth daily.       Allergies:  Allergies  Allergen Reactions  . Metformin And Related Nausea And Vomiting    After one pill at hospital  . Januvia [Sitagliptin Phosphate] Other (See Comments)    Swelling and edema see 4 13 OV    Past Medical History:  Diagnosis Date  . CAD (coronary artery disease)   . Colitis   . Colitis, indeterminate 01/31/2012  . COPD (chronic obstructive pulmonary disease) ([place])   . Diverticulitis 02/10/2012  . DM (diabetes mellitus) ([place])   . Glaucoma   . Glaucoma   . History of ventricular fibrillation   . HTN (hypertension)   . Myocardial infarction    12/1998, 01/2011  . Osteoporosis   . PVD (peripheral vascular disease) ([place])     Past Surgical History:  Procedure Laterality Date  . ABDOMINAL HYSTERECTOMY     endometriosis  . CABG x 3  01/22/2011   Dr Cyndia Bent  . CARDIAC CATHETERIZATION  01/17/2011   Dr Arvella Merles  . LIPOMA EXCISION Left 02/08/2015   Procedure: EXCISION OF LEFT THIGH LIPOMA;  Surgeon: Greer Pickerel, MD;  Location: [facility];  Service: General;  Laterality: Left;  . S/P aortobifemoral bypass  2008   Dr Donnetta Hutching  . S/P myocardial infarction  and PCI of the lt circumflex  2000    Family History  Problem Relation Age of Onset  . Heart attack Father   . Diabetes Father   . Heart disease Father   . Diabetes Mother   . Liver cancer Mother   . Hypertension    . Thyroid disease      sisters- sister had parathyroidectomy  . Alcohol abuse      brother  . Colon polyps Maternal Uncle   . Breast cancer Sister   . Diabetes Sister   . Diabetes Brother     Social History:  reports that she has been smoking Cigarettes.  She has been smoking about 0.40 packs per day. She has never used smokeless tobacco. She reports that she does not drink alcohol or use drugs.    Review of Systems    Lipid history:     Lab Results  Component Value Date   CHOL 132 07/21/2016   HDL 23.50 (L) 07/21/2016   LDLCALC 72 07/21/2016   LDLDIRECT 87.0 04/24/2015   TRIG 183.0 (H) 07/21/2016   CHOLHDL 6 07/21/2016           Hypertension: Present for several years Zestoretic  was reduced to half a tablet with starting Invokana and blood pressure is  Good  BP Readings from Last 3 Encounters:  07/24/16 108/62  06/26/16 114/78  05/30/16 116/88     She has had Mild hypercalcemia since about 2010; PTH levels in the mid-upper range Calcium is upper normal Again  She is taking 1000 units of vitamin D, Her last vitamin D level was 38 No history of fractures  Her last bone density was normal in 07/2015 except T score -2.3 at the wrist, no change in the bone density at the femur Bone density needed in early 2019 to follow-up osteopenia Most recent eye exam was normal in 9/17  Most recent foot exam: 12/16  Review of Systems    LABS:  Lab on 07/21/2016  Component Date Value Ref Range Status  . Hgb A1c MFr Bld 07/21/2016 6.6* 4.6 - 6.5 % Final  . Sodium 07/21/2016 135  135 - 145 mEq/L Final  . Potassium  07/21/2016 4.6  3.5 - 5.1 mEq/L Final  . Chloride 07/21/2016 103  96 - 112 mEq/L Final  . CO2 07/21/2016 26  19 - 32 mEq/L Final  . Glucose, Bld 07/21/2016 164* 70 - 99 mg/dL Final  . BUN 07/21/2016 19  6 - 23 mg/dL Final  . Creatinine, Ser 07/21/2016 0.79  0.40 - 1.20 mg/dL Final  . Calcium 07/21/2016 10.1  8.4 - 10.5 mg/dL Final  . GFR 07/21/2016 77.73  >60.00 mL/min Final  . Cholesterol 07/21/2016 132  0 - 200 mg/dL Final  . Triglycerides 07/21/2016 183.0* 0.0 - 149.0 mg/dL Final  . HDL 07/21/2016 23.50* >39.00 mg/dL Final  . VLDL 07/21/2016 36.6  0.0 - 40.0 mg/dL Final  . LDL Cholesterol 07/21/2016 72  0 - 99 mg/dL Final  . Total CHOL/HDL Ratio 07/21/2016 6   Final  . NonHDL 07/21/2016 108.21   Final    Physical Examination:  BP 108/62   Pulse 85   Temp 97.7 F (36.5 C) (Oral)   Resp 16   Ht 5\' 4"  (1.626 m)   Wt 160 lb 3.2 oz (72.7 kg)   SpO2 96%   BMI 27.50 kg/m   Repeat blood pressure standing was 124/70    ASSESSMENT:  Diabetes type 2, BMI 28  See  history of present illness for detailed discussion of his current management, blood sugar patterns and problems identified  She has good control with A1c 6.6 Has had some variation in her blood sugars as she cannot be always consistent with her diet when she is depressed However trying to do more walking and her weight is leveled off Still benefiting from taking Invokana and not requiring large amounts of insulin  HYPERCALCEMIA: Calcium is upper normal  and stable  HYPERTENSION:  well-controlled with current regimen, repeat blood pressure was not low   PLAN:    She will continue same doses of insulin  Discussed adding protein to bedtime snack.  Call if she is having any unusual blood sugar patterns    Patient Instructions  If am stays >130 go to 38 Lantus   Jentri Aye 07/24/2016, 1:22 PM   Note: This office note was prepared with Estate agent. Any transcriptional errors that  result from this process are unintentional.

## 2016-08-25 ENCOUNTER — Other Ambulatory Visit: Payer: Self-pay | Admitting: Endocrinology

## 2016-08-25 ENCOUNTER — Other Ambulatory Visit: Payer: Self-pay | Admitting: Internal Medicine

## 2016-08-29 ENCOUNTER — Encounter (HOSPITAL_COMMUNITY): Payer: Self-pay | Admitting: Emergency Medicine

## 2016-08-29 ENCOUNTER — Ambulatory Visit (HOSPITAL_COMMUNITY)
Admission: EM | Admit: 2016-08-29 | Discharge: 2016-08-29 | Disposition: A | Discharge status: Home / Self Care | Payer: BLUE CROSS/BLUE SHIELD | Attending: Emergency Medicine | Admitting: Emergency Medicine

## 2016-08-29 DIAGNOSIS — Z7982 Long term (current) use of aspirin: Secondary | ICD-10-CM | POA: Diagnosis not present

## 2016-08-29 DIAGNOSIS — Z8249 Family history of ischemic heart disease and other diseases of the circulatory system: Secondary | ICD-10-CM | POA: Diagnosis not present

## 2016-08-29 DIAGNOSIS — Z803 Family history of malignant neoplasm of breast: Secondary | ICD-10-CM | POA: Insufficient documentation

## 2016-08-29 DIAGNOSIS — J449 Chronic obstructive pulmonary disease, unspecified: Secondary | ICD-10-CM | POA: Insufficient documentation

## 2016-08-29 DIAGNOSIS — F17210 Nicotine dependence, cigarettes, uncomplicated: Secondary | ICD-10-CM | POA: Diagnosis not present

## 2016-08-29 DIAGNOSIS — H409 Unspecified glaucoma: Secondary | ICD-10-CM | POA: Diagnosis not present

## 2016-08-29 DIAGNOSIS — I10 Essential (primary) hypertension: Secondary | ICD-10-CM | POA: Diagnosis not present

## 2016-08-29 DIAGNOSIS — I2510 Atherosclerotic heart disease of native coronary artery without angina pectoris: Secondary | ICD-10-CM | POA: Diagnosis not present

## 2016-08-29 DIAGNOSIS — N939 Abnormal uterine and vaginal bleeding, unspecified: Secondary | ICD-10-CM | POA: Insufficient documentation

## 2016-08-29 DIAGNOSIS — Z79899 Other long term (current) drug therapy: Secondary | ICD-10-CM | POA: Insufficient documentation

## 2016-08-29 DIAGNOSIS — Z833 Family history of diabetes mellitus: Secondary | ICD-10-CM | POA: Diagnosis not present

## 2016-08-29 DIAGNOSIS — E1151 Type 2 diabetes mellitus with diabetic peripheral angiopathy without gangrene: Secondary | ICD-10-CM | POA: Diagnosis not present

## 2016-08-29 DIAGNOSIS — Z794 Long term (current) use of insulin: Secondary | ICD-10-CM | POA: Insufficient documentation

## 2016-08-29 DIAGNOSIS — I252 Old myocardial infarction: Secondary | ICD-10-CM | POA: Insufficient documentation

## 2016-08-29 DIAGNOSIS — Z808 Family history of malignant neoplasm of other organs or systems: Secondary | ICD-10-CM | POA: Insufficient documentation

## 2016-08-29 DIAGNOSIS — E7800 Pure hypercholesterolemia, unspecified: Secondary | ICD-10-CM | POA: Insufficient documentation

## 2016-08-29 LAB — POCT I-STAT, CHEM 8
BUN: 31 mg/dL — AB (ref 6–20)
CALCIUM ION: 1.23 mmol/L (ref 1.15–1.40)
CHLORIDE: 102 mmol/L (ref 101–111)
CREATININE: 0.8 mg/dL (ref 0.44–1.00)
GLUCOSE: 163 mg/dL — AB (ref 65–99)
HCT: 50 % — ABNORMAL HIGH (ref 36.0–46.0)
Hemoglobin: 17 g/dL — ABNORMAL HIGH (ref 12.0–15.0)
Potassium: 4.4 mmol/L (ref 3.5–5.1)
Sodium: 135 mmol/L (ref 135–145)
TCO2: 25 mmol/L (ref 0–100)

## 2016-08-29 NOTE — Discharge Instructions (Signed)
Follow-up with one of these physicians in a week. You may need pelvic imaging to further evaluate the cause of your symptoms. Go to the ER for chest pain, shortness of breath, if you become lightheaded or dizzy, if you start bleeding again and go through more than 1 pad per hour for several hours, or for other concerns.

## 2016-08-29 NOTE — ED Triage Notes (Signed)
Pt reports vag bleeding onset 1430 after getting out of the shower  Since then, she has used 3 pads  Hx of partial hysterectomy 35 years ago  Also c/o lower abd pain  Denies feeling dizzy, LH, weak  A&O x4.... NAD

## 2016-08-29 NOTE — ED Provider Notes (Signed)
HPI  SUBJECTIVE:  Kimberly [name] is a 66 y.o. female who presents with acute onset of vaginal bleeding at 1400 today. Patient states that she went through 2 pads in 10 minutes and states that it is slowing down. She is currently on the third pad. She is states that she had midline pelvic pressure that she describes as a "bubble" followed by the bleeding. She denies pain. There are no aggravating or alleviating factors. She has not tried anything for this. She denies nausea, vomiting, fevers, abdominal pain, back pain, chest pain, shortness of breath, lightheadedness, presyncope, syncope. She has not been sexually active since 2015. She denies vaginal trauma. She denies hematuria or rectal bleeding. She denies genital pain or swelling, discharge, odor, vaginal itching. She is status post partial hysterectomy for endometriosis. Left her ovaries. She states that she has a left ovarian cyst. She has a past medical history diabetes, hypertension, hypercholesterolemia, MI, peripheral vascular disease. She is a smoker. No history of anemia, antiplatelet or anticoagulant therapy other than aspirin 325 mg daily, coagulopathy, fibroids, cervical cancer. VXB:LTJQZE,SPQZR Harmon Pier, MD     Past Medical History:  Diagnosis Date  . CAD (coronary artery disease)   . Colitis   . Colitis, indeterminate 01/31/2012  . COPD (chronic obstructive pulmonary disease) ([place])   . Diverticulitis 02/10/2012  . DM (diabetes mellitus) ([place])   . Glaucoma   . Glaucoma   . History of ventricular fibrillation   . HTN (hypertension)   . Myocardial infarction    12/1998, 01/2011  . Osteoporosis   . PVD (peripheral vascular disease) ([place])     Past Surgical History:  Procedure Laterality Date  . ABDOMINAL HYSTERECTOMY     endometriosis  . CABG x 3  01/22/2011   Dr Cyndia Bent  . CARDIAC CATHETERIZATION  01/17/2011   Dr Arvella Merles  . LIPOMA EXCISION Left 02/08/2015   Procedure: EXCISION OF LEFT THIGH LIPOMA;  Surgeon: Greer Pickerel, MD;   Location: [facility];  Service: General;  Laterality: Left;  . S/P aortobifemoral bypass  2008   Dr Donnetta Hutching  . S/P myocardial infarction and PCI of the lt circumflex  2000    Family History  Problem Relation Age of Onset  . Heart attack Father   . Diabetes Father   . Heart disease Father   . Diabetes Brother   . Diabetes Mother   . Liver cancer Mother   . Hypertension    . Thyroid disease      sisters- sister had parathyroidectomy  . Alcohol abuse      brother  . Colon polyps Maternal Uncle   . Breast cancer Sister   . Diabetes Sister     Social History  Substance Use Topics  . Smoking status: Current Every Day Smoker    Packs/day: 0.40    Types: Cigarettes  . Smokeless tobacco: Never Used  . Alcohol use No    No current facility-administered medications for this encounter.   Current Outpatient Prescriptions:  .  aspirin 325 MG EC tablet, Take 325 mg by mouth every morning. , Disp: , Rfl:  .  atorvastatin (LIPITOR) 80 MG tablet, TAKE 1 TABLET BY MOUTH EVERY DAY, Disp: 90 tablet, Rfl: 2 .  BAYER CONTOUR NEXT TEST test strip, USE AS DIRECTED TO TEST UP TO FOUR TIMES DAILY, Disp: 100 each, Rfl: 0 .  Insulin Pen Needle (BD PEN NEEDLE NANO U/F) 32G X 4 MM MISC, USE WITH LEVEMIR FLEX PEN, Disp: 100 each, Rfl: 5 .  INVOKANA 100 MG TABS tablet, TAKE 1 TABLET BY MOUTH BEFORE BREAKFAST, Disp: 30 tablet, Rfl: 2 .  LANTUS SOLOSTAR 100 UNIT/ML Solostar Pen, INJECT 37 UNITS UNDER THE SKIN AT BEDTIME, Disp: 15 mL, Rfl: 1 .  lisinopril-hydrochlorothiazide (PRINZIDE,ZESTORETIC) 20-12.5 MG tablet, TAKE 1 TABLET BY MOUTH EVERY DAY, Disp: 90 tablet, Rfl: 1 .  LORazepam (ATIVAN) 1 MG tablet, Take 1 tablet (1 mg total) by mouth every 8 (eight) hours as needed for anxiety., Disp: 60 tablet, Rfl: 0 .  NOVOLOG FLEXPEN 100 UNIT/ML FlexPen, INJECT 20 UNITS INTO THE SKIN FOR 1 DOSE WITH LARGEST MEAL, THEN USE AS DIRECTED, Disp: 15 mL, Rfl: 0 .  sertraline (ZOLOFT) 50 MG tablet, Take 1  tablet (50 mg total) by mouth daily., Disp: 90 tablet, Rfl: 1 .  albuterol (PROVENTIL HFA;VENTOLIN HFA) 108 (90 BASE) MCG/ACT inhaler, Inhale 2 puffs into the lungs every 6 (six) hours as needed. For shortness of breath or wheezing, Disp: 1 Inhaler, Rfl: 2  Allergies  Allergen Reactions  . Metformin And Related Nausea And Vomiting    After one pill at hospital  . Januvia [Sitagliptin Phosphate] Other (See Comments)    Swelling and edema see 4 13 OV     ROS  As noted in HPI.   Physical Exam  BP 139/64 (BP Location: Right Arm)   Pulse (!) 101   Temp 98.5 F (36.9 C) (Oral)   Resp 16   SpO2 98%   Constitutional: Well developed, well nourished, no acute distress Eyes:  EOMI, conjunctiva normal bilaterally HENT: Normocephalic, atraumatic,mucus membranes moist Respiratory: Normal inspiratory effort Cardiovascular: mild Tachycardia no murmurs rubs gallops GI: nondistended Soft, nontender. No lower abdominal tenderness. GU: Normal external genitalia, no trauma. Normal urethra. No bleeding from the urethra. Positive white nonodorous discharge. Os intact. No bleeding. No adnexal tenderness, masses on bimanual exam. Chaperone present during exam skin: No rash, skin intact Musculoskeletal: no deformities Neurologic: Alert & oriented x 3, no focal neuro deficits Psychiatric: Speech and behavior appropriate   ED Course   Medications - No data to display  Orders Placed This Encounter  Procedures  . I-STAT, chem 8    Standing Status:   Standing    Number of Occurrences:   1    Results for orders placed or performed during the hospital encounter of 08/29/16 (from the past 24 hour(s))  I-STAT, chem 8     Status: Abnormal   Collection Time: 08/29/16  4:22 PM  Result Value Ref Range   Sodium 135 135 - 145 mmol/L   Potassium 4.4 3.5 - 5.1 mmol/L   Chloride 102 101 - 111 mmol/L   BUN 31 (H) 6 - 20 mg/dL   Creatinine, Ser 0.80 0.44 - 1.00 mg/dL   Glucose, Bld 163 (H) 65 - 99 mg/dL    Calcium, Ion 1.23 1.15 - 1.40 mmol/L   TCO2 25 0 - 100 mmol/L   Hemoglobin 17.0 (H) 12.0 - 15.0 g/dL   HCT 50.0 (H) 36.0 - 46.0 %   No results found.  ED Clinical Impression  Abnormal vaginal bleeding   ED Assessment/Plan  Patient is not anemic. BUN is elevated at 31 but her creatinine is normal.  She has no evidence of vaginal trauma. Urethra appears normal. She has no urethral bleeding or active vaginal bleeding and does not appear to be rectal bleeding either. abd benign.  Unsure as to the cause of her symptoms. Sending off wet prep, but deferring treatment pending labs. She denies  vaginal odor, itching or discharge.  Referring to several OB/GYNs for reevaluation, possible pelvic imaging.   Discussed labs, MDM, plan and followup with patient . Discussed sn/sx that should prompt return to the ED. Patient  agrees with plan.   No orders of the defined types were placed in this encounter.   *This clinic note was created using Dragon dictation software. Therefore, there may be occasional mistakes despite careful proofreading.  ?   Melynda Ripple, MD 08/29/16 [phone]

## 2016-09-01 LAB — CERVICOVAGINAL ANCILLARY ONLY: Wet Prep (BD Affirm): NEGATIVE

## 2016-09-18 ENCOUNTER — Other Ambulatory Visit: Payer: Self-pay | Admitting: Internal Medicine

## 2016-09-19 ENCOUNTER — Other Ambulatory Visit: Payer: Self-pay | Admitting: Internal Medicine

## 2016-09-23 NOTE — Progress Notes (Signed)
Chief Complaint  Patient presents with  . Follow-up    HPI: Kimberly [name] 66 y.o. come in for Chronic disease management   Fu depressiopn anxiety sx  Sertraline She feels she is doing so much better asking about going off medicine. Her family has helped her in getting out in the daylight has helped her. She is going out by herself now without a lot of fear and not crying all the time. Denies significant side effects of medicine She had a trip to the emergency room for bright red vaginal bleeding and was painless describes as not rectal or elsewhere. She doesn't have a uterus see emergency room visit advised to see if gynecologist asked for advice. No history of cancer as known in no lesion seen in the emergency room. No hematuria. I noticed that she was congested states that sinus fullness this time of year and she is using saline as postnasal drainage and some cough. No fever shortness of breath Still smokes but a lot less depression is treated  ROS: See pertinent positives and negatives per HPI. Ed visit last month for  Vag bleeding  Past Medical History:  Diagnosis Date  . CAD (coronary artery disease)   . Colitis   . Colitis, indeterminate 01/31/2012  . COPD (chronic obstructive pulmonary disease) ([place])   . Diverticulitis 02/10/2012  . DM (diabetes mellitus) ([place])   . Glaucoma   . Glaucoma   . History of ventricular fibrillation   . HTN (hypertension)   . Myocardial infarction    12/1998, 01/2011  . Osteoporosis   . PVD (peripheral vascular disease) ([facility])     Family History  Problem Relation Age of Onset  . Heart attack Father   . Diabetes Father   . Heart disease Father   . Diabetes Brother   . Diabetes Mother   . Liver cancer Mother   . Hypertension    . Thyroid disease      sisters- sister had parathyroidectomy  . Alcohol abuse      brother  . Colon polyps Maternal Uncle   . Breast cancer Sister   . Diabetes Sister     Social History   Social History  .  Marital status: Married    Spouse name: N/A  . Number of children: 3  . Years of education: N/A   Occupational History  . housewife Unemployed   Social History Main Topics  . Smoking status: Current Every Day Smoker    Packs/day: 0.40    Types: Cigarettes  . Smokeless tobacco: Never Used  . Alcohol use No  . Drug use: No  . Sexual activity: Not Asked   Other Topics Concern  . None   Social History Narrative   Married just widowed    Regular exercise- yes not recently       Quit tobacco in August at heart surgery. Restarted    Husband diagnosed with liver cancer since 2014 passed April 2015    She was care taker.   No pets    Outpatient Medications Prior to Visit  Medication Sig Dispense Refill  . albuterol (PROVENTIL HFA;VENTOLIN HFA) 108 (90 BASE) MCG/ACT inhaler Inhale 2 puffs into the lungs every 6 (six) hours as needed. For shortness of breath or wheezing 1 Inhaler 2  . aspirin 325 MG EC tablet Take 325 mg by mouth every morning.     Marland Kitchen atorvastatin (LIPITOR) 80 MG tablet TAKE 1 TABLET BY MOUTH EVERY DAY 90 tablet 2  .  BAYER CONTOUR NEXT TEST test strip USE TO TEST UP TO FOUR TIMES DAILY AS DIRECTED 100 each 0  . Insulin Pen Needle (BD PEN NEEDLE NANO U/F) 32G X 4 MM MISC USE WITH LEVEMIR FLEX PEN 100 each 5  . INVOKANA 100 MG TABS tablet TAKE 1 TABLET BY MOUTH BEFORE BREAKFAST 30 tablet 2  . LANTUS SOLOSTAR 100 UNIT/ML Solostar Pen INJECT 37 UNITS UNDER THE SKIN AT BEDTIME 15 mL 1  . lisinopril-hydrochlorothiazide (PRINZIDE,ZESTORETIC) 20-12.5 MG tablet TAKE 1 TABLET BY MOUTH EVERY DAY 90 tablet 0  . LORazepam (ATIVAN) 1 MG tablet Take 1 tablet (1 mg total) by mouth every 8 (eight) hours as needed for anxiety. 60 tablet 0  . NOVOLOG FLEXPEN 100 UNIT/ML FlexPen INJECT 20 UNITS INTO THE SKIN FOR 1 DOSE WITH LARGEST MEAL, THEN USE AS DIRECTED 15 mL 0  . sertraline (ZOLOFT) 50 MG tablet Take 1 tablet (50 mg total) by mouth daily. 90 tablet 1   No facility-administered  medications prior to visit.      EXAM:  BP 130/68 (BP Location: Right Arm, Patient Position: Sitting, Cuff Size: Normal)   Pulse 80   Temp 98.3 F (36.8 C) (Oral)   Ht 5\' 4"  (1.626 m)   Wt 159 lb 12.8 oz (72.5 kg)   BMI 27.43 kg/m   Body mass index is 27.43 kg/m.  GENERAL: vitals reviewed and listed above, alert, oriented, appears well hydrated and in no acute distressShe is some mild nasal congestion affect is much brighter. HEENT: atraumatic, conjunctiva  clear, no obvious abnormalities on inspection of external nose and ears NECK: no obvious masses on inspection palpation  LUNGS: clear to auscultation bilaterally, no wheezes, rales or rhonchi, 1 rhonchi that cleared with cough. Breath sounds equal CV: HRRR, no clubbing cyanosis or  peripheral edema nl cap refill she has a 2/6 systolic ejection murmur at the upper sternal border. MS: moves all extremities without noticeable focal  abnormality PSYCH: pleasant and cooperative, no obvious depression or anxiety  BP Readings from Last 3 Encounters:  09/24/16 130/68  08/29/16 139/64  07/24/16 108/62    ASSESSMENT AND PLAN:  Discussed the following assessment and plan:  Depression with anxiety - improved  stay on med at least 6 mos to avoid relapse  Medication management  Tobacco use disorder  Allergic sinusitis ? - add INCS to saline   Vaginal bleeding episod e - hs had  hystectomy advise  gyne consultationas planned she will make appt  -Patient advised to return or notify health care team  if  new concerns arise.  Patient Instructions  Glad you are doing better. Remain on the sertraline every day for at least 6 months into the treatment. Then if you're doing well we will discuss whether stopping it is the right time. Stopping into early puts you at risk for relapse. In regard to your sinuses and allergies continue your saline but add a nasal cortisone 2 sprays each side of the nose every day for at least 1-2 weeks. If  it is helping continued during the season. I do agree with seeing a gynecologist about the vaginal bleeding episode.   a thorough gynecologic exam would be helpful to make sure there are no serious problems.  Return in 3 months for med check t sertraline and  Decision about weaning off med if appropriate.       Standley Brooking. Derrico Zhong M.D.

## 2016-09-24 ENCOUNTER — Ambulatory Visit (INDEPENDENT_AMBULATORY_CARE_PROVIDER_SITE_OTHER): Payer: BLUE CROSS/BLUE SHIELD | Admitting: Internal Medicine

## 2016-09-24 ENCOUNTER — Encounter: Payer: Self-pay | Admitting: Internal Medicine

## 2016-09-24 VITALS — BP 130/68 | HR 80 | Temp 98.3°F | Ht 64.0 in | Wt 159.8 lb

## 2016-09-24 DIAGNOSIS — J309 Allergic rhinitis, unspecified: Secondary | ICD-10-CM | POA: Diagnosis not present

## 2016-09-24 DIAGNOSIS — Z79899 Other long term (current) drug therapy: Secondary | ICD-10-CM | POA: Diagnosis not present

## 2016-09-24 DIAGNOSIS — N939 Abnormal uterine and vaginal bleeding, unspecified: Secondary | ICD-10-CM

## 2016-09-24 DIAGNOSIS — F418 Other specified anxiety disorders: Secondary | ICD-10-CM

## 2016-09-24 DIAGNOSIS — F17200 Nicotine dependence, unspecified, uncomplicated: Secondary | ICD-10-CM | POA: Diagnosis not present

## 2016-09-24 NOTE — Patient Instructions (Addendum)
Glad you are doing better. Remain on the sertraline every day for at least 6 months into the treatment. Then if you're doing well we will discuss whether stopping it is the right time. Stopping into early puts you at risk for relapse. In regard to your sinuses and allergies continue your saline but add a nasal cortisone 2 sprays each side of the nose every day for at least 1-2 weeks. If it is helping continued during the season. I do agree with seeing a gynecologist about the vaginal bleeding episode.   a thorough gynecologic exam would be helpful to make sure there are no serious problems.  Return in 3 months for med check t sertraline and  Decision about weaning off med if appropriate.

## 2016-10-13 ENCOUNTER — Other Ambulatory Visit: Payer: Self-pay | Admitting: Endocrinology

## 2016-10-14 ENCOUNTER — Other Ambulatory Visit: Payer: Self-pay | Admitting: Internal Medicine

## 2016-10-14 ENCOUNTER — Other Ambulatory Visit: Payer: Self-pay | Admitting: Endocrinology

## 2016-10-14 ENCOUNTER — Other Ambulatory Visit: Payer: Self-pay | Admitting: Emergency Medicine

## 2016-10-14 MED ORDER — GLUCOSE BLOOD VI STRP: ORAL_STRIP | 0 refills | Status: DC

## 2016-10-16 ENCOUNTER — Other Ambulatory Visit: Payer: Self-pay | Admitting: Internal Medicine

## 2016-10-16 ENCOUNTER — Other Ambulatory Visit: Payer: Self-pay | Admitting: Emergency Medicine

## 2016-10-16 MED ORDER — GLUCOSE BLOOD VI STRP: ORAL_STRIP | 0 refills | Status: DC

## 2016-10-17 ENCOUNTER — Other Ambulatory Visit: Payer: Self-pay | Admitting: Internal Medicine

## 2016-10-29 ENCOUNTER — Other Ambulatory Visit: Payer: BLUE CROSS/BLUE SHIELD

## 2016-10-29 ENCOUNTER — Other Ambulatory Visit: Payer: Self-pay | Admitting: Endocrinology

## 2016-10-31 NOTE — Progress Notes (Signed)
Chief Complaint  Patient presents with  . Annual Exam    HPI: Patient  Kimberly [name]  65 y.o. comes in today for Preventive Health Care visit  And Chronic disease management   She in not utd on mammo and other preventive measures cause of cost and also cards fu .   And hopefully later to be done when on medicare coverage  Dm doing well per patient. Would like to wean on  Sertraline . Mood good getting out more with family  Feels back to baseline Still tobacco 1/2 ppd  No etoh . Bp at goal no se of meds     Health Maintenance  Topic Date Due  . HIV Screening  12/21/1965  . COLONOSCOPY  04/22/2018 (Originally 04/24/2015)  . INFLUENZA VACCINE  01/14/2017  . HEMOGLOBIN A1C  01/18/2017  . OPHTHALMOLOGY EXAM  02/14/2017  . FOOT EXAM  07/24/2017  . TETANUS/TDAP  01/31/2019  . Hepatitis C Screening  Completed   Health Maintenance Review LIFESTYLE:  Exercise:   Tobacco/ETS: 15 perday Alcohol:  Sugar beverages: Sleep:8-10 hours  Drug use: no HH of 1 no pets     ROS: denies claudication cp sob  GEN/ HEENT: No fever, significant weight changes sweats headaches vision problems hearing changes, CV/ PULM; No chest pain shortness of breath cough, syncope,edema  change in exercise tolerance. GI /GU: No adominal pain, vomiting, change in bowel habits. No blood in the stool. No significant GU symptoms. SKIN/HEME: ,no acute skin rashes suspicious lesions or bleeding. No lymphadenopathy, nodules, masses.  NEURO/ PSYCH:  No neurologic signs such as weakness numbness. No depression anxiety. IMM/ Allergy: No unusual infections.  Allergy .   REST of 12 system review negative except as per HPI   Past Medical History:  Diagnosis Date  . CAD (coronary artery disease)   . Colitis   . Colitis, indeterminate 01/31/2012  . COPD (chronic obstructive pulmonary disease) ([place])   . Depression 05/16/2017  . Depression with anxiety 05/30/2016  . Diverticulitis 02/10/2012  . DM (diabetes mellitus)  ([place])   . Glaucoma   . Glaucoma   . History of ventricular fibrillation   . HTN (hypertension)   . Myocardial infarction ([place])    12/1998, 01/2011  . Osteoporosis   . PVD (peripheral vascular disease) ([place])     Past Surgical History:  Procedure Laterality Date  . ABDOMINAL HYSTERECTOMY     endometriosis  . CABG x 3  01/22/2011   Dr Cyndia Bent  . CARDIAC CATHETERIZATION  01/17/2011   Dr Arvella Merles  . LIPOMA EXCISION Left 02/08/2015   Procedure: EXCISION OF LEFT THIGH LIPOMA;  Surgeon: Greer Pickerel, MD;  Location: [facility];  Service: General;  Laterality: Left;  . S/P aortobifemoral bypass  2008   Dr Donnetta Hutching  . S/P myocardial infarction and PCI of the lt circumflex  2000    Family History  Problem Relation Age of Onset  . Heart attack Father   . Diabetes Father   . Heart disease Father   . Diabetes Brother   . Diabetes Mother   . Liver cancer Mother   . Hypertension Unknown   . Thyroid disease Unknown        sisters- sister had parathyroidectomy  . Alcohol abuse Unknown        brother  . Colon polyps Maternal Uncle   . Breast cancer Sister   . Diabetes Sister     Social History   Social History  . Marital  status: Married    Spouse name: N/A  . Number of children: 3  . Years of education: N/A   Occupational History  . housewife Unemployed   Social History Main Topics  . Smoking status: Current Every Day Smoker    Packs/day: 0.40    Types: Cigarettes  . Smokeless tobacco: Never Used  . Alcohol use No  . Drug use: No  . Sexual activity: Not Asked   Other Topics Concern  . None   Social History Narrative   t widowed    Regular exercise- yes not recently       Quit tobacco in August at heart surgery. Restarted    Husband diagnosed with liver cancer since 2014 passed April 2015    She was care taker.   No pets    Outpatient Medications Prior to Visit  Medication Sig Dispense Refill  . albuterol (PROVENTIL HFA;VENTOLIN HFA) 108 (90 BASE) MCG/ACT  inhaler Inhale 2 puffs into the lungs every 6 (six) hours as needed. For shortness of breath or wheezing 1 Inhaler 2  . aspirin 325 MG EC tablet Take 325 mg by mouth every morning.     Marland Kitchen atorvastatin (LIPITOR) 80 MG tablet TAKE 1 TABLET BY MOUTH EVERY DAY 90 tablet 0  . glucose blood (BAYER CONTOUR NEXT TEST) test strip USE TO TEST UP TO FOUR TIMES DAILY AS DIRECTED 100 each 0  . Insulin Pen Needle (BD PEN NEEDLE NANO U/F) 32G X 4 MM MISC USE WITH LEVEMIR FLEX PEN 100 each 5  . INVOKANA 100 MG TABS tablet TAKE 1 TABLET BY MOUTH BEFORE BREAKFAST 30 tablet 0  . LANTUS SOLOSTAR 100 UNIT/ML Solostar Pen INJECT 37 UNITS UNDER THE SKIN AT BEDTIME 15 mL 0  . lisinopril-hydrochlorothiazide (PRINZIDE,ZESTORETIC) 20-12.5 MG tablet TAKE 1 TABLET BY MOUTH EVERY DAY 90 tablet 0  . NOVOLOG FLEXPEN 100 UNIT/ML FlexPen INJECT 20 UNITS INTO THE SKIN FOR 1 DOSE WITH LARGEST MEAL, THEN USE AS DIRECTED 15 mL 0  . sertraline (ZOLOFT) 50 MG tablet Take 1 tablet (50 mg total) by mouth daily. 90 tablet 1  . LORazepam (ATIVAN) 1 MG tablet Take 1 tablet (1 mg total) by mouth every 8 (eight) hours as needed for anxiety. 60 tablet 0   No facility-administered medications prior to visit.      EXAM:  BP 100/60 (BP Location: Right Arm, Patient Position: Sitting, Cuff Size: Normal)   Pulse 93   Temp 98 F (36.7 C) (Oral)   Ht _0  (1.626 m)   Wt 160 lb (72.6 kg)   BMI 27.46 kg/m   Body mass index is 27.46 kg/m. Wt Readings from Last 3 Encounters:  11/03/16 160 lb (72.6 kg)  09/24/16 159 lb 12.8 oz (72.5 kg)  07/24/16 160 lb 3.2 oz (72.7 kg)    Physical Exam: Vital signs reviewed MAY:OKHT is a well-developed well-nourished alert cooperative    who appearsr stated age in no acute distress.  HEENT: normocephalic atraumatic , Eyes: PERRL EOM's full, conjunctiva clear, Nares: paten,t no deformity discharge or tenderness., Ears: no deformity EAC's clear TMs with normal landmarks. Mouth: clear OP, no lesions, edema.   Moist mucous membranes. Dentition in adequate repair. NECK: supple without masses, thyromegaly or bruits. CHEST/PULM:  Clear to auscultation and percussion breath sounds equal no wheeze , rales or rhonchi. No chest Creer deformities or tenderness. Breast: normal by inspection . No dimpling, discharge, masses, tenderness or discharge . CV: PMI is nondisplaced, S1 S2 no gallops,2/6 sem  usb not radiating  murmurs, rubs. Peripheral pulses are present  delay.No JVD .  ABDOMEN: Bowel sounds normal nontender  No guard or rebound, no hepato splenomegal no CVA tenderness.  No hernia. Extremtities:  No clubbing cyanosis or edema, no acute joint swelling or redness no focal atrophy NEURO:  Oriented x3, cranial nerves 3-12 appear to be intact, no obvious focal weakness,gait within normal limits no abnormal reflexes or asymmetrical SKIN: No acute rashes normal turgor, color, no bruising or petechiae. PSYCH: Oriented, good eye contact, no obvious depression anxiety, cognition and judgment appear normal. LN: no cervical axillary adenopathy  Lab Results  Component Value Date   WBC 6.3 04/24/2015   HGB 17.0 (H) 08/29/2016   HCT 50.0 (H) 08/29/2016   PLT 141.0 (L) 04/24/2015   GLUCOSE 163 (H) 08/29/2016   CHOL 132 07/21/2016   TRIG 183.0 (H) 07/21/2016   HDL 23.50 (L) 07/21/2016   LDLDIRECT 87.0 04/24/2015   LDLCALC 72 07/21/2016   ALT 18 01/17/2016   AST 18 01/17/2016   NA 135 08/29/2016   K 4.4 08/29/2016   CL 102 08/29/2016   CREATININE 0.80 08/29/2016   BUN 31 (H) 08/29/2016   CO2 26 07/21/2016   TSH 4.13 07/17/2015   INR 1.06 02/01/2012   HGBA1C 6.6 (H) 07/21/2016   MICROALBUR <0.7 04/18/2016    BP Readings from Last 3 Encounters:  11/03/16 100/60  09/24/16 130/68  08/29/16 139/64    Lab results reviewed with patient   ASSESSMENT AND PLAN:  Discussed the following assessment and plan:  Visit for preventive health examination  Medication management - disc weaning the  sertraline  Tobacco use disorder - nl spirometry 2013   Essential hypertension  SYSTOLIC MURMUR  Atherosclerosis of coronary artery bypass graft of native heart without angina pectoris  PERIPHERAL VASCULAR DISEASE  Mammogram declined - cost reasons   Hyperlipidemia associated with type 2 diabetes mellitus ([place]) Disc weaning the sertraline as she is doing quite well today .   She is waiting to be on medicare before  Proceed ing with other  . She continues to smoke at this time.   At this time.  She has vascular disease  And hx cabg and reports no cardiac sx.   Last echo 2017 moderate basal  Hypertrophy  And diastolic dysfunction and  Aortic sclerosis Will plan on cardiology  Opinion  After on medicare .     Patient Care Team: Dyna Figuereo, Standley Brooking, MD as PCP - General Cyndia Bent Fernande Boyden, MD (Cardiothoracic Surgery) Early, Arvilla Meres, MD (Thoracic Diseases) [name], MD as Consulting Physician (Endocrinology) Patient Instructions  Continue efforts to stop smoking. Can decrease her sertraline to 25 mg a day alternating with 50 mg for a couple weeks. Then decrease to 25 mg a day for 2-3 weeks Then alternate off with 25 mg every other day for a few weeks Then decrease every 3 days or as tolerated to wean off Contact us if you're having problems doing this. When you get on Medicare and after you see Dr. Dwyane Dee next Follow-up visit to reassess in your care that occurred because of suboptimal coverage for your care.    Health Maintenance, Female Adopting a healthy lifestyle and getting preventive care can go a long way to promote health and wellness. Talk with your health care provider about what schedule of regular examinations is right for you. This is a good chance for you to check in with your provider about disease prevention and staying healthy.  In between checkups, there are plenty of things you can do on your own. Experts have done a lot of research about which lifestyle changes and  preventive measures are most likely to keep you healthy. Ask your health care provider for more information. Weight and diet Eat a healthy diet  Be sure to include plenty of vegetables, fruits, low-fat dairy products, and lean protein.  Do not eat a lot of foods high in solid fats, added sugars, or salt.  Get regular exercise. This is one of the most important things you can do for your health.  Most adults should exercise for at least 150 minutes each week. The exercise should increase your heart rate and make you sweat (moderate-intensity exercise).  Most adults should also do strengthening exercises at least twice a week. This is in addition to the moderate-intensity exercise. Maintain a healthy weight  Body mass index (BMI) is a measurement that can be used to identify possible weight problems. It estimates body fat based on height and weight. Your health care provider can help determine your BMI and help you achieve or maintain a healthy weight.  For females 60 years of age and older:  A BMI below 18.5 is considered underweight.  A BMI of 18.5 to 24.9 is normal.  A BMI of 25 to 29.9 is considered overweight.  A BMI of 30 and above is considered obese. Watch levels of cholesterol and blood lipids  You should start having your blood tested for lipids and cholesterol at 65 years of age, then have this test every 5 years.  You may need to have your cholesterol levels checked more often if:  Your lipid or cholesterol levels are high.  You are older than 65 years of age.  You are at high risk for heart disease. Cancer screening Lung Cancer  Lung cancer screening is recommended for adults 66-45 years old who are at high risk for lung cancer because of a history of smoking.  A yearly low-dose CT scan of the lungs is recommended for people who:  Currently smoke.  Have quit within the past 15 years.  Have at least a 30-pack-year history of smoking. A pack year is smoking an  average of one pack of cigarettes a day for 1 year.  Yearly screening should continue until it has been 15 years since you quit.  Yearly screening should stop if you develop a health problem that would prevent you from having lung cancer treatment. Breast Cancer  Practice breast self-awareness. This means understanding how your breasts normally appear and feel.  It also means doing regular breast self-exams. Let your health care provider know about any changes, no matter how small.  If you are in your 20s or 30s, you should have a clinical breast exam (CBE) by a health care provider every 1-3 years as part of a regular health exam.  If you are 90 or older, have a CBE every year. Also consider having a breast X-ray (mammogram) every year.  If you have a family history of breast cancer, talk to your health care provider about genetic screening.  If you are at high risk for breast cancer, talk to your health care provider about having an MRI and a mammogram every year.  Breast cancer gene (BRCA) assessment is recommended for women who have family members with BRCA-related cancers. BRCA-related cancers include:  Breast.  Ovarian.  Tubal.  Peritoneal cancers.  Results of the assessment will determine the need for genetic counseling  and BRCA1 and BRCA2 testing. Cervical Cancer  Your health care provider may recommend that you be screened regularly for cancer of the pelvic organs (ovaries, uterus, and vagina). This screening involves a pelvic examination, including checking for microscopic changes to the surface of your cervix (Pap test). You may be encouraged to have this screening done every 3 years, beginning at age 40.  For women ages 2-65, health care providers may recommend pelvic exams and Pap testing every 3 years, or they may recommend the Pap and pelvic exam, combined with testing for human papilloma virus (HPV), every 5 years. Some types of HPV increase your risk of cervical  cancer. Testing for HPV may also be done on women of any age with unclear Pap test results.  Other health care providers may not recommend any screening for nonpregnant women who are considered low risk for pelvic cancer and who do not have symptoms. Ask your health care provider if a screening pelvic exam is right for you.  If you have had past treatment for cervical cancer or a condition that could lead to cancer, you need Pap tests and screening for cancer for at least 20 years after your treatment. If Pap tests have been discontinued, your risk factors (such as having a new sexual partner) need to be reassessed to determine if screening should resume. Some women have medical problems that increase the chance of getting cervical cancer. In these cases, your health care provider may recommend more frequent screening and Pap tests. Colorectal Cancer  This type of cancer can be detected and often prevented.  Routine colorectal cancer screening usually begins at 65 years of age and continues through 65 years of age.  Your health care provider may recommend screening at an earlier age if you have risk factors for colon cancer.  Your health care provider may also recommend using home test kits to check for hidden blood in the stool.  A small camera at the end of a tube can be used to examine your colon directly (sigmoidoscopy or colonoscopy). This is done to check for the earliest forms of colorectal cancer.  Routine screening usually begins at age 22.  Direct examination of the colon should be repeated every 5-10 years through 65 years of age. However, you may need to be screened more often if early forms of precancerous polyps or small growths are found. Skin Cancer  Check your skin from head to toe regularly.  Tell your health care provider about any new moles or changes in moles, especially if there is a change in a mole's shape or color.  Also tell your health care provider if you have a  mole that is larger than the size of a pencil eraser.  Always use sunscreen. Apply sunscreen liberally and repeatedly throughout the day.  Protect yourself by wearing long sleeves, pants, a wide-brimmed hat, and sunglasses whenever you are outside. Heart disease, diabetes, and high blood pressure  High blood pressure causes heart disease and increases the risk of stroke. High blood pressure is more likely to develop in:  People who have blood pressure in the high end of the normal range (130-139/85-89 mm Hg).  People who are overweight or obese.  People who are African American.  If you are 22-73 years of age, have your blood pressure checked every 3-5 years. If you are 4 years of age or older, have your blood pressure checked every year. You should have your blood pressure measured twice-once when you are  at a hospital or clinic, and once when you are not at a hospital or clinic. Record the average of the two measurements. To check your blood pressure when you are not at a hospital or clinic, you can use:  An automated blood pressure machine at a pharmacy.  A home blood pressure monitor.  If you are between 51 years and 89 years old, ask your health care provider if you should take aspirin to prevent strokes.  Have regular diabetes screenings. This involves taking a blood sample to check your fasting blood sugar level.  If you are at a normal weight and have a low risk for diabetes, have this test once every three years after 65 years of age.  If you are overweight and have a high risk for diabetes, consider being tested at a younger age or more often. Preventing infection Hepatitis B  If you have a higher risk for hepatitis B, you should be screened for this virus. You are considered at high risk for hepatitis B if:  You were born in a country where hepatitis B is common. Ask your health care provider which countries are considered high risk.  Your parents were born in a  high-risk country, and you have not been immunized against hepatitis B (hepatitis B vaccine).  You have HIV or AIDS.  You use needles to inject street drugs.  You live with someone who has hepatitis B.  You have had sex with someone who has hepatitis B.  You get hemodialysis treatment.  You take certain medicines for conditions, including cancer, organ transplantation, and autoimmune conditions. Hepatitis C  Blood testing is recommended for:  Everyone born from [birth year] through 1964.  Anyone with known risk factors for hepatitis C. Sexually transmitted infections (STIs)  You should be screened for sexually transmitted infections (STIs) including gonorrhea and chlamydia if:  You are sexually active and are younger than 65 years of age.  You are older than 65 years of age and your health care provider tells you that you are at risk for this type of infection.  Your sexual activity has changed since you were last screened and you are at an increased risk for chlamydia or gonorrhea. Ask your health care provider if you are at risk.  If you do not have HIV, but are at risk, it may be recommended that you take a prescription medicine daily to prevent HIV infection. This is called pre-exposure prophylaxis (PrEP). You are considered at risk if:  You are sexually active and do not regularly use condoms or know the HIV status of your partner(s).  You take drugs by injection.  You are sexually active with a partner who has HIV. Talk with your health care provider about whether you are at high risk of being infected with HIV. If you choose to begin PrEP, you should first be tested for HIV. You should then be tested every 3 months for as long as you are taking PrEP. Pregnancy  If you are premenopausal and you may become pregnant, ask your health care provider about preconception counseling.  If you may become pregnant, take 400 to 800 micrograms (mcg) of folic acid every day.  If you want  to prevent pregnancy, talk to your health care provider about birth control (contraception). Osteoporosis and menopause  Osteoporosis is a disease in which the bones lose minerals and strength with aging. This can result in serious bone fractures. Your risk for osteoporosis can be identified using a bone density  scan.  If you are 65 years of age or older, or if you are at risk for osteoporosis and fractures, ask your health care provider if you should be screened.  Ask your health care provider whether you should take a calcium or vitamin D supplement to lower your risk for osteoporosis.  Menopause may have certain physical symptoms and risks.  Hormone replacement therapy may reduce some of these symptoms and risks. Talk to your health care provider about whether hormone replacement therapy is right for you. Follow these instructions at home:  Schedule regular health, dental, and eye exams.  Stay current with your immunizations.  Do not use any tobacco products including cigarettes, chewing tobacco, or electronic cigarettes.  If you are pregnant, do not drink alcohol.  If you are breastfeeding, limit how much and how often you drink alcohol.  Limit alcohol intake to no more than 1 drink per day for nonpregnant women. One drink equals 12 ounces of beer, 5 ounces of wine, or 1 ounces of hard liquor.  Do not use street drugs.  Do not share needles.  Ask your health care provider for help if you need support or information about quitting drugs.  Tell your health care provider if you often feel depressed.  Tell your health care provider if you have ever been abused or do not feel safe at home. This information is not intended to replace advice given to you by your health care provider. Make sure you discuss any questions you have with your health care provider. Document Released: 12/16/2010 Document Revised: 11/08/2015 Document Reviewed: 03/06/2015 Elsevier Interactive Patient  Education  2017 [place]. Jerelene Salaam M.D.

## 2016-11-03 ENCOUNTER — Encounter: Payer: Self-pay | Admitting: Internal Medicine

## 2016-11-03 ENCOUNTER — Ambulatory Visit (INDEPENDENT_AMBULATORY_CARE_PROVIDER_SITE_OTHER): Payer: BLUE CROSS/BLUE SHIELD | Admitting: Internal Medicine

## 2016-11-03 VITALS — BP 100/60 | HR 93 | Temp 98.0°F | Ht 64.0 in | Wt 160.0 lb

## 2016-11-03 DIAGNOSIS — E1169 Type 2 diabetes mellitus with other specified complication: Secondary | ICD-10-CM

## 2016-11-03 DIAGNOSIS — I25810 Atherosclerosis of coronary artery bypass graft(s) without angina pectoris: Secondary | ICD-10-CM

## 2016-11-03 DIAGNOSIS — F17200 Nicotine dependence, unspecified, uncomplicated: Secondary | ICD-10-CM

## 2016-11-03 DIAGNOSIS — I10 Essential (primary) hypertension: Secondary | ICD-10-CM | POA: Diagnosis not present

## 2016-11-03 DIAGNOSIS — I739 Peripheral vascular disease, unspecified: Secondary | ICD-10-CM

## 2016-11-03 DIAGNOSIS — E785 Hyperlipidemia, unspecified: Secondary | ICD-10-CM | POA: Diagnosis not present

## 2016-11-03 DIAGNOSIS — Z79899 Other long term (current) drug therapy: Secondary | ICD-10-CM | POA: Diagnosis not present

## 2016-11-03 DIAGNOSIS — Z0000 Encounter for general adult medical examination without abnormal findings: Secondary | ICD-10-CM | POA: Diagnosis not present

## 2016-11-03 DIAGNOSIS — R011 Cardiac murmur, unspecified: Secondary | ICD-10-CM | POA: Diagnosis not present

## 2016-11-03 DIAGNOSIS — Z5320 Procedure and treatment not carried out because of patient's decision for unspecified reasons: Secondary | ICD-10-CM

## 2016-11-03 NOTE — Patient Instructions (Signed)
Continue efforts to stop smoking. Can decrease her sertraline to 25 mg a day alternating with 50 mg for a couple weeks. Then decrease to 25 mg a day for 2-3 weeks Then alternate off with 25 mg every other day for a few weeks Then decrease every 3 days or as tolerated to wean off Contact us if you're having problems doing this. When you get on Medicare and after you see Dr. Dwyane Dee next Follow-up visit to reassess in your care that occurred because of suboptimal coverage for your care.    Health Maintenance, Female Adopting a healthy lifestyle and getting preventive care can go a long way to promote health and wellness. Talk with your health care provider about what schedule of regular examinations is right for you. This is a good chance for you to check in with your provider about disease prevention and staying healthy. In between checkups, there are plenty of things you can do on your own. Experts have done a lot of research about which lifestyle changes and preventive measures are most likely to keep you healthy. Ask your health care provider for more information. Weight and diet Eat a healthy diet  Be sure to include plenty of vegetables, fruits, low-fat dairy products, and lean protein.  Do not eat a lot of foods high in solid fats, added sugars, or salt.  Get regular exercise. This is one of the most important things you can do for your health.  Most adults should exercise for at least 150 minutes each week. The exercise should increase your heart rate and make you sweat (moderate-intensity exercise).  Most adults should also do strengthening exercises at least twice a week. This is in addition to the moderate-intensity exercise. Maintain a healthy weight  Body mass index (BMI) is a measurement that can be used to identify possible weight problems. It estimates body fat based on height and weight. Your health care provider can help determine your BMI and help you achieve or maintain a  healthy weight.  For females 30 years of age and older:  A BMI below 18.5 is considered underweight.  A BMI of 18.5 to 24.9 is normal.  A BMI of 25 to 29.9 is considered overweight.  A BMI of 30 and above is considered obese. Watch levels of cholesterol and blood lipids  You should start having your blood tested for lipids and cholesterol at 65 years of age, then have this test every 5 years.  You may need to have your cholesterol levels checked more often if:  Your lipid or cholesterol levels are high.  You are older than 65 years of age.  You are at high risk for heart disease. Cancer screening Lung Cancer  Lung cancer screening is recommended for adults 61-60 years old who are at high risk for lung cancer because of a history of smoking.  A yearly low-dose CT scan of the lungs is recommended for people who:  Currently smoke.  Have quit within the past 15 years.  Have at least a 30-pack-year history of smoking. A pack year is smoking an average of one pack of cigarettes a day for 1 year.  Yearly screening should continue until it has been 15 years since you quit.  Yearly screening should stop if you develop a health problem that would prevent you from having lung cancer treatment. Breast Cancer  Practice breast self-awareness. This means understanding how your breasts normally appear and feel.  It also means doing regular breast self-exams. Let  your health care provider know about any changes, no matter how small.  If you are in your 20s or 30s, you should have a clinical breast exam (CBE) by a health care provider every 1-3 years as part of a regular health exam.  If you are 27 or older, have a CBE every year. Also consider having a breast X-ray (mammogram) every year.  If you have a family history of breast cancer, talk to your health care provider about genetic screening.  If you are at high risk for breast cancer, talk to your health care provider about having  an MRI and a mammogram every year.  Breast cancer gene (BRCA) assessment is recommended for women who have family members with BRCA-related cancers. BRCA-related cancers include:  Breast.  Ovarian.  Tubal.  Peritoneal cancers.  Results of the assessment will determine the need for genetic counseling and BRCA1 and BRCA2 testing. Cervical Cancer  Your health care provider may recommend that you be screened regularly for cancer of the pelvic organs (ovaries, uterus, and vagina). This screening involves a pelvic examination, including checking for microscopic changes to the surface of your cervix (Pap test). You may be encouraged to have this screening done every 3 years, beginning at age 4.  For women ages 52-65, health care providers may recommend pelvic exams and Pap testing every 3 years, or they may recommend the Pap and pelvic exam, combined with testing for human papilloma virus (HPV), every 5 years. Some types of HPV increase your risk of cervical cancer. Testing for HPV may also be done on women of any age with unclear Pap test results.  Other health care providers may not recommend any screening for nonpregnant women who are considered low risk for pelvic cancer and who do not have symptoms. Ask your health care provider if a screening pelvic exam is right for you.  If you have had past treatment for cervical cancer or a condition that could lead to cancer, you need Pap tests and screening for cancer for at least 20 years after your treatment. If Pap tests have been discontinued, your risk factors (such as having a new sexual partner) need to be reassessed to determine if screening should resume. Some women have medical problems that increase the chance of getting cervical cancer. In these cases, your health care provider may recommend more frequent screening and Pap tests. Colorectal Cancer  This type of cancer can be detected and often prevented.  Routine colorectal cancer screening  usually begins at 65 years of age and continues through 65 years of age.  Your health care provider may recommend screening at an earlier age if you have risk factors for colon cancer.  Your health care provider may also recommend using home test kits to check for hidden blood in the stool.  A small camera at the end of a tube can be used to examine your colon directly (sigmoidoscopy or colonoscopy). This is done to check for the earliest forms of colorectal cancer.  Routine screening usually begins at age 46.  Direct examination of the colon should be repeated every 5-10 years through 65 years of age. However, you may need to be screened more often if early forms of precancerous polyps or small growths are found. Skin Cancer  Check your skin from head to toe regularly.  Tell your health care provider about any new moles or changes in moles, especially if there is a change in a mole's shape or color.  Also tell  your health care provider if you have a mole that is larger than the size of a pencil eraser.  Always use sunscreen. Apply sunscreen liberally and repeatedly throughout the day.  Protect yourself by wearing long sleeves, pants, a wide-brimmed hat, and sunglasses whenever you are outside. Heart disease, diabetes, and high blood pressure  High blood pressure causes heart disease and increases the risk of stroke. High blood pressure is more likely to develop in:  People who have blood pressure in the high end of the normal range (130-139/85-89 mm Hg).  People who are overweight or obese.  People who are African American.  If you are 55-38 years of age, have your blood pressure checked every 3-5 years. If you are 26 years of age or older, have your blood pressure checked every year. You should have your blood pressure measured twice-once when you are at a hospital or clinic, and once when you are not at a hospital or clinic. Record the average of the two measurements. To check your  blood pressure when you are not at a hospital or clinic, you can use:  An automated blood pressure machine at a pharmacy.  A home blood pressure monitor.  If you are between 46 years and 51 years old, ask your health care provider if you should take aspirin to prevent strokes.  Have regular diabetes screenings. This involves taking a blood sample to check your fasting blood sugar level.  If you are at a normal weight and have a low risk for diabetes, have this test once every three years after 65 years of age.  If you are overweight and have a high risk for diabetes, consider being tested at a younger age or more often. Preventing infection Hepatitis B  If you have a higher risk for hepatitis B, you should be screened for this virus. You are considered at high risk for hepatitis B if:  You were born in a country where hepatitis B is common. Ask your health care provider which countries are considered high risk.  Your parents were born in a high-risk country, and you have not been immunized against hepatitis B (hepatitis B vaccine).  You have HIV or AIDS.  You use needles to inject street drugs.  You live with someone who has hepatitis B.  You have had sex with someone who has hepatitis B.  You get hemodialysis treatment.  You take certain medicines for conditions, including cancer, organ transplantation, and autoimmune conditions. Hepatitis C  Blood testing is recommended for:  Everyone born from [birth year] through 1964.  Anyone with known risk factors for hepatitis C. Sexually transmitted infections (STIs)  You should be screened for sexually transmitted infections (STIs) including gonorrhea and chlamydia if:  You are sexually active and are younger than 65 years of age.  You are older than 65 years of age and your health care provider tells you that you are at risk for this type of infection.  Your sexual activity has changed since you were last screened and you are at an  increased risk for chlamydia or gonorrhea. Ask your health care provider if you are at risk.  If you do not have HIV, but are at risk, it may be recommended that you take a prescription medicine daily to prevent HIV infection. This is called pre-exposure prophylaxis (PrEP). You are considered at risk if:  You are sexually active and do not regularly use condoms or know the HIV status of your partner(s).  You take  drugs by injection.  You are sexually active with a partner who has HIV. Talk with your health care provider about whether you are at high risk of being infected with HIV. If you choose to begin PrEP, you should first be tested for HIV. You should then be tested every 3 months for as long as you are taking PrEP. Pregnancy  If you are premenopausal and you may become pregnant, ask your health care provider about preconception counseling.  If you may become pregnant, take 400 to 800 micrograms (mcg) of folic acid every day.  If you want to prevent pregnancy, talk to your health care provider about birth control (contraception). Osteoporosis and menopause  Osteoporosis is a disease in which the bones lose minerals and strength with aging. This can result in serious bone fractures. Your risk for osteoporosis can be identified using a bone density scan.  If you are 62 years of age or older, or if you are at risk for osteoporosis and fractures, ask your health care provider if you should be screened.  Ask your health care provider whether you should take a calcium or vitamin D supplement to lower your risk for osteoporosis.  Menopause may have certain physical symptoms and risks.  Hormone replacement therapy may reduce some of these symptoms and risks. Talk to your health care provider about whether hormone replacement therapy is right for you. Follow these instructions at home:  Schedule regular health, dental, and eye exams.  Stay current with your immunizations.  Do not use  any tobacco products including cigarettes, chewing tobacco, or electronic cigarettes.  If you are pregnant, do not drink alcohol.  If you are breastfeeding, limit how much and how often you drink alcohol.  Limit alcohol intake to no more than 1 drink per day for nonpregnant women. One drink equals 12 ounces of beer, 5 ounces of wine, or 1 ounces of hard liquor.  Do not use street drugs.  Do not share needles.  Ask your health care provider for help if you need support or information about quitting drugs.  Tell your health care provider if you often feel depressed.  Tell your health care provider if you have ever been abused or do not feel safe at home. This information is not intended to replace advice given to you by your health care provider. Make sure you discuss any questions you have with your health care provider. Document Released: 12/16/2010 Document Revised: 11/08/2015 Document Reviewed: 03/06/2015 Elsevier Interactive Patient Education  2017 [employer].

## 2016-11-08 ENCOUNTER — Encounter: Payer: Self-pay | Admitting: Internal Medicine

## 2016-11-09 ENCOUNTER — Other Ambulatory Visit: Payer: Self-pay | Admitting: Internal Medicine

## 2016-11-18 ENCOUNTER — Other Ambulatory Visit (INDEPENDENT_AMBULATORY_CARE_PROVIDER_SITE_OTHER): Payer: BLUE CROSS/BLUE SHIELD

## 2016-11-18 DIAGNOSIS — Z794 Long term (current) use of insulin: Secondary | ICD-10-CM

## 2016-11-18 DIAGNOSIS — E1165 Type 2 diabetes mellitus with hyperglycemia: Secondary | ICD-10-CM | POA: Diagnosis not present

## 2016-11-18 LAB — BASIC METABOLIC PANEL
BUN: 21 mg/dL (ref 6–23)
CO2: 27 meq/L (ref 19–32)
Calcium: 10.2 mg/dL (ref 8.4–10.5)
Chloride: 103 mEq/L (ref 96–112)
Creatinine, Ser: 0.86 mg/dL (ref 0.40–1.20)
GFR: 70.41 mL/min (ref >60.00)
Glucose, Bld: 125 mg/dL — ABNORMAL HIGH (ref 70–99)
Potassium: 4.9 mEq/L (ref 3.5–5.1)
Sodium: 136 mEq/L (ref 135–145)

## 2016-11-18 LAB — HEMOGLOBIN A1C: HEMOGLOBIN A1C: 6.8 % — AB (ref 4.6–6.5)

## 2016-11-21 ENCOUNTER — Ambulatory Visit (INDEPENDENT_AMBULATORY_CARE_PROVIDER_SITE_OTHER): Payer: BLUE CROSS/BLUE SHIELD | Admitting: Endocrinology

## 2016-11-21 ENCOUNTER — Other Ambulatory Visit: Payer: Self-pay | Admitting: Endocrinology

## 2016-11-21 ENCOUNTER — Encounter: Payer: Self-pay | Admitting: Endocrinology

## 2016-11-21 VITALS — BP 106/70 | HR 68 | Ht 64.0 in | Wt 160.8 lb

## 2016-11-21 DIAGNOSIS — E1165 Type 2 diabetes mellitus with hyperglycemia: Secondary | ICD-10-CM | POA: Diagnosis not present

## 2016-11-21 DIAGNOSIS — Z794 Long term (current) use of insulin: Secondary | ICD-10-CM | POA: Diagnosis not present

## 2016-11-21 NOTE — Progress Notes (Signed)
Patient ID: Kimberly [name], female   DOB: July 02, 1950, 66 y.o.   MRN: [id number]           Reason for Appointment: follow-up for Type 2 Diabetes  History of Present Illness:          Date of diagnosis of type 2 diabetes mellitus: 1995        Background history:   For several years she had been tried on oral agents with variable control.  Detailed history not available She thinks that she had nausea and vomiting with metformin and could not take much Also had been tried on Amaryl She thinks she had swelling with Januvia and not clear what other medications she has tried before  She was started on insulin in 07/2013 when her A1c was over 11%  Recent history:   INSULIN regimen is: 36 Lantus hs   Novolog 18  units before lunch. Novolog 10 acs    Because of her poor control and A1c of 9.1 she was started on Invokana 100 mg daily in 12/16 Blood sugars improved significantly after adding Invokana 100 mg and she was able to reduce her Lantus   Her A1c is gradually increasing and now 6.8, previously 6.5  Current blood sugar patterns, management and problems identified:  She was offered the use of the V-go pump previously but she has still not decided on this  She thinks that because of personal situations last month she was not able to take her mealtime insulin consistently with going out to eat but she is trying to do better more recently  However she is also trying different carbohydrates and occasionally may have higher readings from certain foods like sweet potatoes  Her fasting readings are fairly consistent although slightly higher overall  Average blood sugars after lunch and supper are still excellent recently  Since her last visit she has been able to try to walk more regularly especially recently  Weight stayed the same  She is concerned about the cost of her insulin, being $40 for each  No hypoglycemia at any time  Non-insulin hypoglycemic drugs the patient is taking are:   Invokana 100 mg daily      Side effects from medications have been: Nausea from metformin,?  Swelling from Januvia  Compliance with the medical regimen: Good Hypoglycemia: None  Glucose monitoring:  done up to 3 times a day         Glucometer:  Contour       Blood Glucose readings by time of day and averages from meter download recently:  Mean values apply above for all meters except median for One Touch  PRE-MEAL Fasting Lunch Dinner Bedtime Overall  Glucose range: 107-155       Mean/median: 131     139   POST-MEAL PC Breakfast PC Lunch PC Dinner  Glucose range:  85-1 80  86-223   Mean/median:  131  149      Self-care: The diet that the patient has been following is: tries to limit fats, bread.     Meal times are:  Lunch: 1 pm Dinner: 4-5 PM   Typical meal intake: Breakfast is oatmeal or grits             Dietician visit, most recent: 09/2015               Exercise:  walking 6/7 days a week  Weight history:  Wt Readings from Last 3 Encounters:  11/21/16 160 lb 12.8 oz (  72.9 kg)  11/03/16 160 lb (72.6 kg)  09/24/16 159 lb 12.8 oz (72.5 kg)    Glycemic control:   Lab Results  Component Value Date   HGBA1C 6.8 (H) 11/18/2016   HGBA1C 6.6 (H) 07/21/2016   HGBA1C 6.5 04/18/2016   Lab Results  Component Value Date   MICROALBUR <0.7 04/18/2016   LDLCALC 72 07/21/2016   CREATININE 0.86 11/18/2016       Allergies as of 11/21/2016      Reactions   Metformin And Related Nausea And Vomiting   After one pill at hospital   Januvia [sitagliptin Phosphate] Other (See Comments)   Swelling and edema see 4 13 OV      Medication List       Accurate as of 11/21/16  8:24 AM. Always use your most recent med list.          albuterol 108 (90 Base) MCG/ACT inhaler Commonly known as:  PROVENTIL HFA;VENTOLIN HFA Inhale 2 puffs into the lungs every 6 (six) hours as needed. For shortness of breath or wheezing   aspirin 325 MG EC tablet Take 325 mg by mouth every morning.     atorvastatin 80 MG tablet Commonly known as:  LIPITOR TAKE 1 TABLET BY MOUTH EVERY DAY   glucose blood test strip Commonly known as:  BAYER CONTOUR NEXT TEST USE TO TEST UP TO FOUR TIMES DAILY AS DIRECTED   CONTOUR NEXT TEST test strip Generic drug:  glucose blood USE TO TEST UP TO FOUR TIMES DAILY AS DIRECTED   Insulin Pen Needle 32G X 4 MM Misc Commonly known as:  BD PEN NEEDLE NANO U/F USE WITH LEVEMIR FLEX PEN   INVOKANA 100 MG Tabs tablet Generic drug:  canagliflozin TAKE 1 TABLET BY MOUTH BEFORE BREAKFAST   LANTUS SOLOSTAR 100 UNIT/ML Solostar Pen Generic drug:  Insulin Glargine INJECT 37 UNITS UNDER THE SKIN AT BEDTIME   lisinopril-hydrochlorothiazide 20-12.5 MG tablet Commonly known as:  PRINZIDE,ZESTORETIC TAKE 1 TABLET BY MOUTH EVERY DAY   NOVOLOG FLEXPEN 100 UNIT/ML FlexPen Generic drug:  insulin aspart INJECT 20 UNITS INTO THE SKIN FOR 1 DOSE WITH LARGEST MEAL, THEN USE AS DIRECTED   sertraline 50 MG tablet Commonly known as:  ZOLOFT Take 1 tablet (50 mg total) by mouth daily.       Allergies:  Allergies  Allergen Reactions  . Metformin And Related Nausea And Vomiting    After one pill at hospital  . Januvia [Sitagliptin Phosphate] Other (See Comments)    Swelling and edema see 4 13 OV    Past Medical History:  Diagnosis Date  . CAD (coronary artery disease)   . Colitis   . Colitis, indeterminate 01/31/2012  . COPD (chronic obstructive pulmonary disease) ([place])   . Depression 05/16/2017  . Depression with anxiety 05/30/2016  . Diverticulitis 02/10/2012  . DM (diabetes mellitus) ([place])   . Glaucoma   . Glaucoma   . History of ventricular fibrillation   . HTN (hypertension)   . Myocardial infarction ([place])    12/1998, 01/2011  . Osteoporosis   . PVD (peripheral vascular disease) ([place])     Past Surgical History:  Procedure Laterality Date  . ABDOMINAL HYSTERECTOMY     endometriosis  . CABG x 3  01/22/2011   Dr Cyndia Bent  . CARDIAC  CATHETERIZATION  01/17/2011   Dr Arvella Merles  . LIPOMA EXCISION Left 02/08/2015   Procedure: EXCISION OF LEFT THIGH LIPOMA;  Surgeon: Greer Pickerel, MD;  Location: [facility];  Service: General;  Laterality: Left;  . S/P aortobifemoral bypass  2008   Dr Donnetta Hutching  . S/P myocardial infarction and PCI of the lt circumflex  2000    Family History  Problem Relation Age of Onset  . Heart attack Father   . Diabetes Father   . Heart disease Father   . Diabetes Brother   . Diabetes Mother   . Liver cancer Mother   . Hypertension Unknown   . Thyroid disease Unknown        sisters- sister had parathyroidectomy  . Alcohol abuse Unknown        brother  . Colon polyps Maternal Uncle   . Breast cancer Sister   . Diabetes Sister     Social History:  reports that she has been smoking Cigarettes.  She has been smoking about 0.40 packs per day. She has never used smokeless tobacco. She reports that she does not drink alcohol or use drugs.    Review of Systems    Lipid history:     Lab Results  Component Value Date   CHOL 132 07/21/2016   HDL 23.50 (L) 07/21/2016   LDLCALC 72 07/21/2016   LDLDIRECT 87.0 04/24/2015   TRIG 183.0 (H) 07/21/2016   CHOLHDL 6 07/21/2016           Hypertension: Present for several years Zestoretic  was reduced to half a tablet with starting Invokana and blood pressure is  Good  BP Readings from Last 3 Encounters:  11/21/16 106/70  11/03/16 100/60  09/24/16 130/68     She has had Mild hypercalcemia since about 2010; PTH levels in the mid-upper range Calcium is upper normal Again  She is taking 1000 units of vitamin D, Her last vitamin D level was 38 No history of fractures  Her last bone density was normal in 07/2015 except T score -2.3 at the wrist, no change in the bone density at the femur Bone density needed in early 2019 to follow-up osteopenia Most recent eye exam was normal in 9/17  Most recent foot exam: 12/16  Review of  Systems    LABS:  Lab on 11/18/2016  Component Date Value Ref Range Status  . Hgb A1c MFr Bld 11/18/2016 6.8* 4.6 - 6.5 % Final   Glycemic Control Guidelines for People with Diabetes:Non Diabetic:  <6%Goal of Therapy: <7%Additional Action Suggested:  >8%   . Sodium 11/18/2016 136  135 - 145 mEq/L Final  . Potassium 11/18/2016 4.9  3.5 - 5.1 mEq/L Final  . Chloride 11/18/2016 103  96 - 112 mEq/L Final  . CO2 11/18/2016 27  19 - 32 mEq/L Final  . Glucose, Bld 11/18/2016 125* 70 - 99 mg/dL Final  . BUN 11/18/2016 21  6 - 23 mg/dL Final  . Creatinine, Ser 11/18/2016 0.86  0.40 - 1.20 mg/dL Final  . Calcium 11/18/2016 10.2  8.4 - 10.5 mg/dL Final  . GFR 11/18/2016 70.41  >60.00 mL/min Final    Physical Examination:  BP 106/70   Pulse 68   Ht 5\' 4"  (1.626 m)   Wt 160 lb 12.8 oz (72.9 kg)   SpO2 96%   BMI 27.60 kg/m   Repeat blood pressure standing was 118/68    ASSESSMENT:  Diabetes type 2, BMI 28  See history of present illness for detailed discussion of his current management, blood sugar patterns and problems identified  She has Fairly good control with A1c 6.8, previously 6.6 Has had consistent improvement  with adding Invokana to her regimen previously  Has had some high readings after meals but on an average fairly good She also may not have taken her mealtime insulin consistently last month for various reasons but is doing better now Again discussed the use of the V-go pump and how this would be used in place of basal bolus insulin as well as the benefits and usually results that are better than injectable insulin with Lantus doses   HYPERCALCEMIA: Calcium is upper normal  and stable  HYPERTENSION:  well-controlled with current regimen, again readings are relatively low   PLAN:    She will continue same doses of insulin  She will call back about the V-go pump  Given co-pay card for Lantus  On her next prescription she will change her Zestoretic to lisinopril  10 mg to avoid hypotension    There are no Patient Instructions on file for this visit.  Evonna Stoltz 11/21/2016, 8:24 AM   Note: This office note was prepared with Dragon voice recognition system technology. Any transcriptional errors that result from this process are unintentional.

## 2016-11-21 NOTE — Patient Instructions (Signed)
Change BP med when out, pl call  Check Cost of V-go, Humalog and Novolog

## 2016-11-28 ENCOUNTER — Other Ambulatory Visit: Payer: Self-pay | Admitting: Endocrinology

## 2016-12-01 ENCOUNTER — Other Ambulatory Visit: Payer: Self-pay | Admitting: Endocrinology

## 2016-12-18 ENCOUNTER — Other Ambulatory Visit: Payer: Self-pay | Admitting: Internal Medicine

## 2016-12-25 ENCOUNTER — Ambulatory Visit: Payer: BLUE CROSS/BLUE SHIELD | Admitting: Internal Medicine

## 2016-12-27 ENCOUNTER — Other Ambulatory Visit: Payer: Self-pay | Admitting: Endocrinology

## 2016-12-31 ENCOUNTER — Other Ambulatory Visit: Payer: Self-pay | Admitting: Endocrinology

## 2017-01-01 ENCOUNTER — Other Ambulatory Visit: Payer: Self-pay | Admitting: Internal Medicine

## 2017-01-05 ENCOUNTER — Other Ambulatory Visit: Payer: Self-pay | Admitting: Emergency Medicine

## 2017-01-05 MED ORDER — GLUCOSE BLOOD VI STRP: ORAL_STRIP | 3 refills | Status: DC

## 2017-01-07 ENCOUNTER — Telehealth: Payer: Self-pay | Admitting: Endocrinology

## 2017-01-07 NOTE — Telephone Encounter (Signed)
[employer] customer care calling in reference to make sure a medical exception fax was received. Please call and advise.

## 2017-01-07 NOTE — Telephone Encounter (Signed)
Called [employer] customer care service and spoke to Cameron and let him know that we did receive the fax and it is currently sitting on Dr. Ronnie Derby desk and once it is filled out we will fax it back.

## 2017-01-12 ENCOUNTER — Other Ambulatory Visit: Payer: Self-pay | Admitting: Internal Medicine

## 2017-01-12 NOTE — Telephone Encounter (Signed)
Dr. Regis Bill wanted patient to f/u in aug. Sent in a 30 day supply for statin medication. Please help patient schedule.

## 2017-01-12 NOTE — Telephone Encounter (Signed)
Pt has been sch

## 2017-01-16 ENCOUNTER — Telehealth: Payer: Self-pay

## 2017-01-16 NOTE — Telephone Encounter (Signed)
Just received fax from [employer] stating patient is approved for V-go 40 and I have contacted the patient to inform her of this but she stated she will not do it because it still costs $172 and needs to know if there is any patient assistance for this please advise

## 2017-01-16 NOTE — Telephone Encounter (Signed)
Please get her contact number for the [employer] company to call

## 2017-01-19 ENCOUNTER — Other Ambulatory Visit: Payer: Self-pay | Admitting: Endocrinology

## 2017-01-20 NOTE — Telephone Encounter (Signed)
Spoke with the patient and gave her the [employer] customer care number so that she can contact for assistance- patient verbalized an understanding

## 2017-02-02 NOTE — Progress Notes (Signed)
Chief Complaint  Patient presents with  . Follow-up    HPI: Kimberly [name] 66 y.o. come in for Chronic disease management    Sees dr Kimberly [name] for dm  He adjsuted bp med cause of  Low BP    But she is actually taking 1/2 pill and feels bp is good at this time.   still tobacco low amounts but daily  Denies sob  Doe  But has an intermittent cough     Is now on medicare     Feels mood is good .  On atorvastatin 80 and doesn't want to add  Med if at all possible.   No vision change  Neuro sx.   Is notw off sertraline and doing well per report ROS: See pertinent positives and negatives per HPI.  Past Medical History:  Diagnosis Date  . CAD (coronary artery disease)   . Colitis   . Colitis, indeterminate 01/31/2012  . COPD (chronic obstructive pulmonary disease) ([place])   . Depression 05/16/2017  . Depression with anxiety 05/30/2016  . Diverticulitis 02/10/2012  . DM (diabetes mellitus) ([place])   . Glaucoma   . Glaucoma   . History of ventricular fibrillation   . HTN (hypertension)   . Myocardial infarction ([place])    12/1998, 01/2011  . Osteoporosis   . PVD (peripheral vascular disease) ([facility])     Family History  Problem Relation Age of Onset  . Heart attack Father   . Diabetes Father   . Heart disease Father   . Diabetes Brother   . Diabetes Mother   . Liver cancer Mother   . Hypertension Unknown   . Thyroid disease Unknown        sisters- sister had parathyroidectomy  . Alcohol abuse Unknown        brother  . Colon polyps Maternal Uncle   . Breast cancer Sister   . Diabetes Sister     Social History   Social History  . Marital status: Married    Spouse name: N/A  . Number of children: 3  . Years of education: N/A   Occupational History  . housewife Unemployed   Social History Main Topics  . Smoking status: Current Every Day Smoker    Packs/day: 0.40    Types: Cigarettes  . Smokeless tobacco: Never Used  . Alcohol use No  . Drug use: No  . Sexual  activity: Not Asked   Other Topics Concern  . None   Social History Narrative   t widowed    Regular exercise- yes not recently       Quit tobacco in August at heart surgery. Restarted    Husband diagnosed with liver cancer since 2014 passed April 2015    She was care taker.   No pets    Outpatient Medications Prior to Visit  Medication Sig Dispense Refill  . albuterol (PROVENTIL HFA;VENTOLIN HFA) 108 (90 BASE) MCG/ACT inhaler Inhale 2 puffs into the lungs every 6 (six) hours as needed. For shortness of breath or wheezing 1 Inhaler 2  . aspirin 325 MG EC tablet Take 325 mg by mouth every morning.     Marland Kitchen atorvastatin (LIPITOR) 80 MG tablet TAKE 1 TABLET BY MOUTH EVERY DAY 30 tablet 0  . glucose blood (CONTOUR NEXT TEST) test strip TEST AS DIRECTED UP TO FOUR TIMES DAILY Dx: E11.9 100 each 3  . Insulin Pen Needle (BD PEN NEEDLE NANO U/F) 32G X 4 MM MISC USE WITH LEVEMIR FLEX  PEN 100 each 5  . INVOKANA 100 MG TABS tablet TAKE 1 TABLET BY MOUTH BEFORE BREAKFAST 30 tablet 3  . LANTUS SOLOSTAR 100 UNIT/ML Solostar Pen INJECT 37 UNITS UNDER THE SKIN AT BEDTIME 15 mL 0  . NOVOLOG FLEXPEN 100 UNIT/ML FlexPen INJECT 20 UNITS INTO THE SKIN FOR 1 DOSE WITH LARGEST MEAL, THEN USE AS DIRECTED 15 mL 0  . glucose blood (BAYER CONTOUR NEXT TEST) test strip USE TO TEST UP TO FOUR TIMES DAILY AS DIRECTED 100 each 0  . lisinopril-hydrochlorothiazide (PRINZIDE,ZESTORETIC) 20-12.5 MG tablet TAKE 1 TABLET BY MOUTH EVERY DAY 90 tablet 0  . sertraline (ZOLOFT) 50 MG tablet TAKE 1 TABLET(50 MG) BY MOUTH DAILY 90 tablet 0   No facility-administered medications prior to visit.      EXAM:  BP 124/78 (BP Location: Right Arm, Patient Position: Sitting, Cuff Size: Normal)   Pulse 88   Temp 98.3 F (36.8 C) (Oral)   Wt 161 lb 3.2 oz (73.1 kg)   BMI 27.67 kg/m   Body mass index is 27.67 kg/m.  GENERAL: vitals reviewed and listed above, alert, oriented, appears well hydrated and in no acute distress ocass  cough  HEENT: atraumatic, conjunctiva  clear, no obvious abnormalities on inspection of external nose and ears  NECK: no obvious masses on inspection palpation  LUNGS:  ocass wheeze no rales   No resp distress  CV: HRRR, no clubbing cyanosis or  peripheral edema nl cap refill  MS: moves all extremities without noticeable focal  abnormality PSYCH: pleasant and cooperative, no obvious depression or anxiety Lab Results  Component Value Date   WBC 6.3 04/24/2015   HGB 17.0 (H) 08/29/2016   HCT 50.0 (H) 08/29/2016   PLT 141.0 (L) 04/24/2015   GLUCOSE 125 (H) 11/18/2016   CHOL 132 07/21/2016   TRIG 183.0 (H) 07/21/2016   HDL 23.50 (L) 07/21/2016   LDLDIRECT 87.0 04/24/2015   LDLCALC 72 07/21/2016   ALT 18 01/17/2016   AST 18 01/17/2016   NA 136 11/18/2016   K 4.9 11/18/2016   CL 103 11/18/2016   CREATININE 0.86 11/18/2016   BUN 21 11/18/2016   CO2 27 11/18/2016   TSH 4.13 07/17/2015   INR 1.06 02/01/2012   HGBA1C 6.8 (H) 11/18/2016   MICROALBUR <0.7 04/18/2016   BP Readings from Last 3 Encounters:  02/03/17 124/78  11/21/16 106/70  11/03/16 100/60   Wt Readings from Last 3 Encounters:  02/03/17 161 lb 3.2 oz (73.1 kg)  11/21/16 160 lb 12.8 oz (72.9 kg)  11/03/16 160 lb (72.6 kg)     ASSESSMENT AND PLAN:  Discussed the following assessment and plan:  Essential hypertension  Need for prophylactic vaccination against Streptococcus pneumoniae (pneumococcus) - Plan: Pneumococcal conjugate vaccine 13-valent IM  Medication management  Tobacco use  Diabetes mellitus with peripheral vascular disease ([place])  Cough - not complaining but addressed at the visit  Elevated hemoglobin ([place]) - presumed from tobacco but didnt address today  will ned to be followed  Kimberly [name] is very hesitant to change her medication at this point discussed the cough possibly related to smoking and even the ACE inhibitor and options that we can have she states she's feeling fine now and would like  to wait. Discussed alarm symptoms with her. At this point can stay on a half dose of the Prinzide because she states she is doing well at this time. Her last text x-ray year ago was no acute findings and her pulmonary  function test in 2013 were normal. -Patient advised to return or notify health care team  if  new concerns arise. Total visit 81mins > 50% spent counseling and coordinating care as indicated in above note and in instructions to patient .    Patient Instructions  Consider   Changing the bp med to one that doesn't aggravate cough   Ok to stay on same med for now 1/2 dose  Seems to do well.  Continue to Work on stopping tobacco . consider repeating the lung function test.   Last  Cholesterol panel was  February and   ldl was  72  Close to goal  Of 70 .  Consider adding medication but ok to   Watch for now.   Get welcome to medicare visit in about 4-6 months              Standley Brooking. Pranika Finks M.D.

## 2017-02-03 ENCOUNTER — Encounter: Payer: Self-pay | Admitting: Internal Medicine

## 2017-02-03 ENCOUNTER — Ambulatory Visit (INDEPENDENT_AMBULATORY_CARE_PROVIDER_SITE_OTHER): Payer: Medicare Other | Admitting: Internal Medicine

## 2017-02-03 VITALS — BP 124/78 | HR 88 | Temp 98.3°F | Wt 161.2 lb

## 2017-02-03 DIAGNOSIS — R059 Cough, unspecified: Secondary | ICD-10-CM

## 2017-02-03 DIAGNOSIS — E1151 Type 2 diabetes mellitus with diabetic peripheral angiopathy without gangrene: Secondary | ICD-10-CM | POA: Diagnosis not present

## 2017-02-03 DIAGNOSIS — R05 Cough: Secondary | ICD-10-CM | POA: Diagnosis not present

## 2017-02-03 DIAGNOSIS — I25810 Atherosclerosis of coronary artery bypass graft(s) without angina pectoris: Secondary | ICD-10-CM

## 2017-02-03 DIAGNOSIS — Z79899 Other long term (current) drug therapy: Secondary | ICD-10-CM

## 2017-02-03 DIAGNOSIS — Z23 Encounter for immunization: Secondary | ICD-10-CM | POA: Diagnosis not present

## 2017-02-03 DIAGNOSIS — D582 Other hemoglobinopathies: Secondary | ICD-10-CM

## 2017-02-03 DIAGNOSIS — I10 Essential (primary) hypertension: Secondary | ICD-10-CM

## 2017-02-03 DIAGNOSIS — Z720 Tobacco use: Secondary | ICD-10-CM | POA: Diagnosis not present

## 2017-02-03 MED ORDER — LISINOPRIL-HYDROCHLOROTHIAZIDE 20-12.5 MG PO TABS: 0.5000 | ORAL_TABLET | Freq: Every day | ORAL | 0 refills | Status: DC

## 2017-02-03 NOTE — Patient Instructions (Signed)
Consider   Changing the bp med to one that doesn't aggravate cough   Ok to stay on same med for now 1/2 dose  Seems to do well.  Continue to Work on stopping tobacco . consider repeating the lung function test.   Last  Cholesterol panel was  February and   ldl was  72  Close to goal  Of 70 .  Consider adding medication but ok to   Watch for now.   Get welcome to medicare visit in about 4-6 months

## 2017-02-08 ENCOUNTER — Other Ambulatory Visit: Payer: Self-pay | Admitting: Endocrinology

## 2017-02-13 ENCOUNTER — Other Ambulatory Visit: Payer: Self-pay | Admitting: Internal Medicine

## 2017-02-19 ENCOUNTER — Other Ambulatory Visit (INDEPENDENT_AMBULATORY_CARE_PROVIDER_SITE_OTHER): Payer: Medicare Other

## 2017-02-19 DIAGNOSIS — E1165 Type 2 diabetes mellitus with hyperglycemia: Secondary | ICD-10-CM | POA: Diagnosis not present

## 2017-02-19 DIAGNOSIS — Z794 Long term (current) use of insulin: Secondary | ICD-10-CM | POA: Diagnosis not present

## 2017-02-19 LAB — MICROALBUMIN / CREATININE URINE RATIO
Creatinine,U: 111 mg/dL
Microalb Creat Ratio: 1.3 mg/g (ref 0.0–30.0)
Microalb, Ur: 1.4 mg/dL (ref 0.0–1.9)

## 2017-02-19 LAB — BASIC METABOLIC PANEL
BUN: 20 mg/dL (ref 6–23)
CO2: 28 mEq/L (ref 19–32)
Calcium: 10.2 mg/dL (ref 8.4–10.5)
Chloride: 101 mEq/L (ref 96–112)
Creatinine, Ser: 0.79 mg/dL (ref 0.40–1.20)
GFR: 77.59 mL/min (ref >60.00)
Glucose, Bld: 102 mg/dL — ABNORMAL HIGH (ref 70–99)
Potassium: 4.8 mEq/L (ref 3.5–5.1)
Sodium: 135 mEq/L (ref 135–145)

## 2017-02-19 LAB — HEMOGLOBIN A1C: HEMOGLOBIN A1C: 6.5 % (ref 4.6–6.5)

## 2017-02-20 ENCOUNTER — Other Ambulatory Visit: Payer: BLUE CROSS/BLUE SHIELD

## 2017-02-22 NOTE — Progress Notes (Signed)
Patient ID: Kimberly [name], female   DOB: [birth year], 66 y.o.   MRN: [id number]           Reason for Appointment: follow-up for Type 2 Diabetes  History of Present Illness:          Date of diagnosis of type 2 diabetes mellitus: 1995        Background history:   For several years she had been tried on oral agents with variable control.  Detailed history not available She thinks that she had nausea and vomiting with metformin and could not take much Also had been tried on Amaryl She thinks she had swelling with Januvia and not clear what other medications she has tried before  She was started on insulin in 07/2013 when her A1c was over 11%  Recent history:   INSULIN regimen is: 38 Lantus hs   Novolog 20  units before lunch. Novolog 10 acs    Because of her poor control and A1c of 9.1 she was started on Invokana 100 mg daily in 12/16 Blood sugars improved significantly after adding Invokana 100 mg and she was able to reduce her Lantus   Her A1c is better than before at 6.5, previously range 6.5-6.8  Current blood sugar patterns, management and problems identified:  She checked on the cost of the V-go pump and she thinks it was too expensive  She thinks that certain foods like grapes make her blood sugar go up and she is watching her diet based on her postprandial readings  However she probably has low normal readings frequently after lunch including readings as low as 87, she has increased her dosage to 20 units at lunch instead of 18, this is her main meal  She is still very consistent with walking  Weight has come down  She is concerned about the cost of Invokana, being $40 for each 30 day supply  No hypoglycemia at any time including after lunch  Non-insulin hypoglycemic drugs the patient is taking are:  Invokana 100 mg daily      Side effects from medications have been: Nausea from metformin,?  Swelling from Januvia  Compliance with the medical regimen: Good Hypoglycemia:  None  Glucose monitoring:  done up to 3 times a day         Glucometer:  Contour       Blood Glucose readings by time of day and averages from meter download recently:   Mean values apply above for all meters except median for One Touch  PRE-MEAL Fasting Lunch Dinner Bedtime Overall  Glucose range: 101-151       Mean/median: 129       POST-MEAL PC Breakfast PC Lunch PC Dinner  Glucose range:  87-1 85  84-211   Mean/median:  120 152    Self-care: The diet that the patient has been following is: tries to limit fats, breads.     Meal times are:  Lunch: 12 pm Dinner: 4-5 PM   Typical meal intake: Breakfast is oatmeal or grits             Dietician visit, most recent: 09/2015               Exercise:  walking 6/7 days a week Either outside or indoors  Weight history:  Wt Readings from Last 3 Encounters:  02/23/17 158 lb 6.4 oz (71.8 kg)  02/03/17 161 lb 3.2 oz (73.1 kg)  11/21/16 160 lb 12.8 oz (72.9 kg)  Glycemic control:   Lab Results  Component Value Date   HGBA1C 6.5 02/19/2017   HGBA1C 6.8 (H) 11/18/2016   HGBA1C 6.6 (H) 07/21/2016   Lab Results  Component Value Date   MICROALBUR 1.4 02/19/2017   LDLCALC 72 07/21/2016   CREATININE 0.79 02/19/2017       Allergies as of 02/23/2017      Reactions   Metformin And Related Nausea And Vomiting   After one pill at hospital   Januvia [sitagliptin Phosphate] Other (See Comments)   Swelling and edema see 4 13 OV      Medication List       Accurate as of 02/23/17  8:10 AM. Always use your most recent med list.          albuterol 108 (90 Base) MCG/ACT inhaler Commonly known as:  PROVENTIL HFA;VENTOLIN HFA Inhale 2 puffs into the lungs every 6 (six) hours as needed. For shortness of breath or wheezing   aspirin 325 MG EC tablet Take 325 mg by mouth every morning.   atorvastatin 80 MG tablet Commonly known as:  LIPITOR TAKE 1 TABLET BY MOUTH EVERY DAY   glucose blood test strip Commonly known as:  CONTOUR  NEXT TEST TEST AS DIRECTED UP TO FOUR TIMES DAILY Dx: E11.9   Insulin Pen Needle 32G X 4 MM Misc Commonly known as:  BD PEN NEEDLE NANO U/F USE WITH LEVEMIR FLEX PEN   INVOKANA 100 MG Tabs tablet Generic drug:  canagliflozin TAKE 1 TABLET BY MOUTH BEFORE BREAKFAST   LANTUS SOLOSTAR 100 UNIT/ML Solostar Pen Generic drug:  Insulin Glargine INJECT 37 UNITS UNDER THE SKIN AT BEDTIME   lisinopril-hydrochlorothiazide 20-12.5 MG tablet Commonly known as:  PRINZIDE,ZESTORETIC Take 0.5 tablets by mouth daily.   NOVOLOG FLEXPEN 100 UNIT/ML FlexPen Generic drug:  insulin aspart INJECT 20 UNITS INTO THE SKIN FOR 1 DOSE WITH LARGEST MEAL, THEN USE AS DIRECTED       Allergies:  Allergies  Allergen Reactions  . Metformin And Related Nausea And Vomiting    After one pill at hospital  . Januvia [Sitagliptin Phosphate] Other (See Comments)    Swelling and edema see 4 13 OV    Past Medical History:  Diagnosis Date  . CAD (coronary artery disease)   . Colitis   . Colitis, indeterminate 01/31/2012  . COPD (chronic obstructive pulmonary disease) ([place])   . Depression 05/16/2017  . Depression with anxiety 05/30/2016  . Diverticulitis 02/10/2012  . DM (diabetes mellitus) ([place])   . Glaucoma   . Glaucoma   . History of ventricular fibrillation   . HTN (hypertension)   . Myocardial infarction ([employer])    12/1998, 01/2011  . Osteoporosis   . PVD (peripheral vascular disease) ([place])     Past Surgical History:  Procedure Laterality Date  . ABDOMINAL HYSTERECTOMY     endometriosis  . CABG x 3  01/22/2011   Dr Cyndia Bent  . CARDIAC CATHETERIZATION  01/17/2011   Dr Arvella Merles  . LIPOMA EXCISION Left 02/08/2015   Procedure: EXCISION OF LEFT THIGH LIPOMA;  Surgeon: Greer Pickerel, MD;  Location: [facility];  Service: General;  Laterality: Left;  . S/P aortobifemoral bypass  2008   Dr Donnetta Hutching  . S/P myocardial infarction and PCI of the lt circumflex  2000    Family History  Problem Relation Age  of Onset  . Heart attack Father   . Diabetes Father   . Heart disease Father   . Diabetes Brother   .  Diabetes Mother   . Liver cancer Mother   . Hypertension Unknown   . Thyroid disease Unknown        sisters- sister had parathyroidectomy  . Alcohol abuse Unknown        brother  . Colon polyps Maternal Uncle   . Breast cancer Sister   . Diabetes Sister     Social History:  reports that she has been smoking Cigarettes.  She has been smoking about 0.40 packs per day. She has never used smokeless tobacco. She reports that she does not drink alcohol or use drugs.    Review of Systems    Lipid history:     Lab Results  Component Value Date   CHOL 132 07/21/2016   HDL 23.50 (L) 07/21/2016   LDLCALC 72 07/21/2016   LDLDIRECT 87.0 04/24/2015   TRIG 183.0 (H) 07/21/2016   CHOLHDL 6 07/21/2016           Hypertension: Present for several years Zestoretic  was reduced to half a tablet with starting Invokana and blood pressure is  Good  BP Readings from Last 3 Encounters:  02/23/17 116/76  02/03/17 124/78  11/21/16 106/70     She has had Mild hypercalcemia since about 2010; PTH levels in the mid-upper range Calcium is upper normal Again  She is taking 1000 units of vitamin D, Her last vitamin D level was 38 No history of fractures  Her last bone density was normal in 07/2015 except T score -2.3 at the wrist, no change in the bone density at the femur Bone density needed in early 2019 to follow-up osteopenia  Most recent eye exam was normal in 9/18  Most recent foot exam: 2/18  Review of Systems    LABS:  Lab on 02/19/2017  Component Date Value Ref Range Status  . Hgb A1c MFr Bld 02/19/2017 6.5  4.6 - 6.5 % Final   Glycemic Control Guidelines for People with Diabetes:Non Diabetic:  <6%Goal of Therapy: <7%Additional Action Suggested:  >8%   . Sodium 02/19/2017 135  135 - 145 mEq/L Final  . Potassium 02/19/2017 4.8  3.5 - 5.1 mEq/L Final  . Chloride 02/19/2017 101   96 - 112 mEq/L Final  . CO2 02/19/2017 28  19 - 32 mEq/L Final  . Glucose, Bld 02/19/2017 102* 70 - 99 mg/dL Final  . BUN 02/19/2017 20  6 - 23 mg/dL Final  . Creatinine, Ser 02/19/2017 0.79  0.40 - 1.20 mg/dL Final  . Calcium 02/19/2017 10.2  8.4 - 10.5 mg/dL Final  . GFR 02/19/2017 77.59  >60.00 mL/min Final  . Microalb, Ur 02/19/2017 1.4  0.0 - 1.9 mg/dL Final  . Creatinine,U 02/19/2017 111.0  mg/dL Final  . Microalb Creat Ratio 02/19/2017 1.3  0.0 - 30.0 mg/g Final    Physical Examination:  BP 116/76   Pulse 89   Ht 5\' 4"  (1.626 m)   Wt 158 lb 6.4 oz (71.8 kg)   SpO2 96%   BMI 27.19 kg/m        ASSESSMENT:  Diabetes type 2, BMI 28  See history of present illness for detailed discussion of his current management, blood sugar patterns and problems identified  She has improved control with A1c down to 6.5 This is without any hypoglycemia but her blood sugars are the lowest after lunch when she is taking the largest amount of mealtime insulin She has kept her weight down and is trying to walk for exercise as well as watch  her diet She is concerned about the cost of Invokana   Microalbumin is normal  HYPERCALCEMIA: Calcium is upper normal  and  Unchanged Discussed needing bone density again next February  HYPERTENSION:  well-controlled with current regimen,Also followed by PCP   PLAN:    She will Reduce her lunchtime coverage to  18 units  continue same doses of LANTUS insulin  Given co-pay card for  30 day supply of Invokana  She will continue to monitor blood sugars periodically after lunch and supper and sometimes after breakfast also    May consider using the V-go pump next year if her insurance approves it   There are no Patient Instructions on file for this visit.  Terika Pillard 02/23/2017, 8:10 AM   Note: This office note was prepared with Dragon voice recognition system technology. Any transcriptional errors that result from this process are  unintentional.

## 2017-02-23 ENCOUNTER — Ambulatory Visit (INDEPENDENT_AMBULATORY_CARE_PROVIDER_SITE_OTHER): Payer: Medicare Other | Admitting: Endocrinology

## 2017-02-23 ENCOUNTER — Encounter: Payer: Self-pay | Admitting: Endocrinology

## 2017-02-23 ENCOUNTER — Ambulatory Visit: Payer: BLUE CROSS/BLUE SHIELD | Admitting: Internal Medicine

## 2017-02-23 VITALS — BP 116/76 | HR 89 | Ht 64.0 in | Wt 158.4 lb

## 2017-02-23 DIAGNOSIS — E1165 Type 2 diabetes mellitus with hyperglycemia: Secondary | ICD-10-CM

## 2017-02-23 DIAGNOSIS — Z794 Long term (current) use of insulin: Secondary | ICD-10-CM

## 2017-02-23 DIAGNOSIS — I25810 Atherosclerosis of coronary artery bypass graft(s) without angina pectoris: Secondary | ICD-10-CM

## 2017-02-23 NOTE — Patient Instructions (Signed)
Check blood sugars on waking up  3/7  Also check blood sugars about 2 hours after a meal and do this after different meals by rotation  Recommended blood sugar levels on waking up is 90-130 and about 2 hours after meal is 130-160  Please bring your blood sugar monitor to each visit, thank you  Take only 18 units at lunch

## 2017-02-24 DIAGNOSIS — E119 Type 2 diabetes mellitus without complications: Secondary | ICD-10-CM | POA: Diagnosis not present

## 2017-02-24 DIAGNOSIS — H43393 Other vitreous opacities, bilateral: Secondary | ICD-10-CM | POA: Diagnosis not present

## 2017-02-24 DIAGNOSIS — H2513 Age-related nuclear cataract, bilateral: Secondary | ICD-10-CM | POA: Diagnosis not present

## 2017-02-24 LAB — HM DIABETES EYE EXAM

## 2017-02-26 ENCOUNTER — Encounter: Payer: Self-pay | Admitting: Endocrinology

## 2017-02-27 ENCOUNTER — Encounter: Payer: Self-pay | Admitting: Internal Medicine

## 2017-03-11 ENCOUNTER — Other Ambulatory Visit: Payer: Self-pay | Admitting: Endocrinology

## 2017-03-17 ENCOUNTER — Other Ambulatory Visit: Payer: Self-pay | Admitting: Internal Medicine

## 2017-03-21 DIAGNOSIS — Z23 Encounter for immunization: Secondary | ICD-10-CM | POA: Diagnosis not present

## 2017-05-07 ENCOUNTER — Other Ambulatory Visit: Payer: Self-pay | Admitting: Endocrinology

## 2017-06-18 ENCOUNTER — Other Ambulatory Visit: Payer: Self-pay | Admitting: Endocrinology

## 2017-06-22 ENCOUNTER — Other Ambulatory Visit (INDEPENDENT_AMBULATORY_CARE_PROVIDER_SITE_OTHER): Payer: Self-pay

## 2017-06-22 DIAGNOSIS — E1165 Type 2 diabetes mellitus with hyperglycemia: Secondary | ICD-10-CM

## 2017-06-22 DIAGNOSIS — Z794 Long term (current) use of insulin: Secondary | ICD-10-CM

## 2017-06-22 LAB — LIPID PANEL
CHOL/HDL RATIO: 5
Cholesterol: 127 mg/dL (ref 0–200)
HDL: 26.4 mg/dL — AB (ref >39.00)
LDL CALC: 70 mg/dL (ref 0–99)
NonHDL: 100.34
TRIGLYCERIDES: 150 mg/dL — AB (ref 0.0–149.0)
VLDL: 30 mg/dL (ref 0.0–40.0)

## 2017-06-22 LAB — COMPREHENSIVE METABOLIC PANEL
ALBUMIN: 4.1 g/dL (ref 3.5–5.2)
ALT: 20 U/L (ref 0–35)
AST: 19 U/L (ref 0–37)
Alkaline Phosphatase: 90 U/L (ref 39–117)
BILIRUBIN TOTAL: 0.5 mg/dL (ref 0.2–1.2)
BUN: 17 mg/dL (ref 6–23)
CALCIUM: 10.2 mg/dL (ref 8.4–10.5)
CO2: 27 meq/L (ref 19–32)
CREATININE: 0.73 mg/dL (ref 0.40–1.20)
Chloride: 102 mEq/L (ref 96–112)
GFR: 84.91 mL/min (ref >60.00)
Glucose, Bld: 109 mg/dL — ABNORMAL HIGH (ref 70–99)
Potassium: 4.8 mEq/L (ref 3.5–5.1)
Sodium: 136 mEq/L (ref 135–145)
Total Protein: 7.2 g/dL (ref 6.0–8.3)

## 2017-06-22 LAB — HEMOGLOBIN A1C: HEMOGLOBIN A1C: 6.5 % (ref 4.6–6.5)

## 2017-06-25 ENCOUNTER — Ambulatory Visit (INDEPENDENT_AMBULATORY_CARE_PROVIDER_SITE_OTHER): Payer: PPO | Admitting: Endocrinology

## 2017-06-25 ENCOUNTER — Encounter: Payer: Self-pay | Admitting: Endocrinology

## 2017-06-25 VITALS — BP 120/70 | HR 82 | Ht 64.0 in | Wt 158.0 lb

## 2017-06-25 DIAGNOSIS — E559 Vitamin D deficiency, unspecified: Secondary | ICD-10-CM

## 2017-06-25 DIAGNOSIS — M8589 Other specified disorders of bone density and structure, multiple sites: Secondary | ICD-10-CM

## 2017-06-25 DIAGNOSIS — E119 Type 2 diabetes mellitus without complications: Secondary | ICD-10-CM

## 2017-06-25 NOTE — Progress Notes (Signed)
Patient ID: Kimberly [name], female   DOB: October 05, 1950, 67 y.o.   MRN: [id number]           Reason for Appointment: follow-up for Type 2 Diabetes  History of Present Illness:          Date of diagnosis of type 2 diabetes mellitus: 1995        Background history:   For several years she had been tried on oral agents with variable control.  Detailed history not available She thinks that she had nausea and vomiting with metformin and could not take much Also had been tried on Amaryl She thinks she had swelling with Januvia and not clear what other medications she has tried before  She was started on insulin in 07/2013 when her A1c was over 11%  Recent history:   INSULIN regimen is: 38 Lantus hs   Novolog 18  units before lunch. Novolog 10 acs    Because of her poor control and A1c of 9.1 she was started on Invokana 100 mg daily in 12/16 Blood sugars improved significantly after adding Invokana 100 mg and she was able to reduce her Lantus   Her A1c is stable at 6.5  Current blood sugar patterns, management and problems identified:  Her blood sugars look excellent at home when she is checking them by rotation after lunch and dinner and periodically in the morning also  With readings in the low normal range previously in the afternoon she was told to cut back to 18 on her Novolog and her sugars are more even with only one low normal reading of 67  Blood sugars are also excellent after supper  Fasting readings are better than on her last visit and she does not think she has overnight hypoglycemia  Although she has not been as much walking as usual she is planning to start going to the gym  She is very consistent with watching her portions and high-fat foods   Non-insulin hypoglycemic drugs the patient is taking are:  Invokana 100 mg daily      Side effects from medications have been: Nausea from metformin,?  Swelling from Januvia  Compliance with the medical regimen:  Excellent  Glucose monitoring:  done up to 3 times a day         Glucometer:  Contour       Blood Glucose readings by time of day and averages from meter download recently:   Mean values apply above for all meters except median for One Touch  PRE-MEAL Fasting Lunch Dinner Bedtime Overall  Glucose range: 86-149     67-161   Mean/median: 103     108   POST-MEAL PC Breakfast PC Lunch PC Dinner  Glucose range:  67-1 61  78-156   Mean/median:  114  113     Previous blood sugars  Mean values apply above for all meters except median for One Touch  PRE-MEAL Fasting Lunch Dinner Bedtime Overall  Glucose range: 101-151       Mean/median: 129       POST-MEAL PC Breakfast PC Lunch PC Dinner  Glucose range:  87-1 85  84-211   Mean/median:  120 152    Self-care: The diet that the patient has been following is: tries to limit fats, breads.     Meal times are:  Lunch: 12 pm Dinner: 4-5 PM   Typical meal intake: Breakfast is oatmeal or grits  Dietician visit, most recent: 09/2015               Exercise:  walking 1-6/7 days a week   Weight history:  Wt Readings from Last 3 Encounters:  06/25/17 158 lb (71.7 kg)  02/23/17 158 lb 6.4 oz (71.8 kg)  02/03/17 161 lb 3.2 oz (73.1 kg)    Glycemic control:   Lab Results  Component Value Date   HGBA1C 6.5 06/22/2017   HGBA1C 6.5 02/19/2017   HGBA1C 6.8 (H) 11/18/2016   Lab Results  Component Value Date   MICROALBUR 1.4 02/19/2017   LDLCALC 70 06/22/2017   CREATININE 0.73 06/22/2017       Allergies as of 06/25/2017      Reactions   Metformin And Related Nausea And Vomiting   After one pill at hospital   Januvia [sitagliptin Phosphate] Other (See Comments)   Swelling and edema see 4 13 OV      Medication List        Accurate as of 06/25/17  8:17 AM. Always use your most recent med list.          albuterol 108 (90 Base) MCG/ACT inhaler Commonly known as:  PROVENTIL HFA;VENTOLIN HFA Inhale 2 puffs into the  lungs every 6 (six) hours as needed. For shortness of breath or wheezing   aspirin 325 MG EC tablet Take 325 mg by mouth every morning.   atorvastatin 80 MG tablet Commonly known as:  LIPITOR TAKE 1 TABLET BY MOUTH EVERY DAY   glucose blood test strip Commonly known as:  CONTOUR NEXT TEST TEST AS DIRECTED UP TO FOUR TIMES DAILY Dx: E11.9   Insulin Pen Needle 32G X 4 MM Misc Commonly known as:  BD PEN NEEDLE NANO U/F USE WITH LEVEMIR FLEX PEN   INVOKANA 100 MG Tabs tablet Generic drug:  canagliflozin TAKE 1 TABLET BY MOUTH BEFORE BREAKFAST   LANTUS SOLOSTAR 100 UNIT/ML Solostar Pen Generic drug:  Insulin Glargine INJECT 37 UNITS UNDER THE SKIN AT BEDTIME   lisinopril-hydrochlorothiazide 20-12.5 MG tablet Commonly known as:  PRINZIDE,ZESTORETIC Take 0.5 tablets by mouth daily.   NOVOLOG FLEXPEN 100 UNIT/ML FlexPen Generic drug:  insulin aspart INJECT 20 UNITS INTO THE SKIN FOR 1 DOSE WITH LARGEST MEAL, THEN USE AS DIRECTED       Allergies:  Allergies  Allergen Reactions  . Metformin And Related Nausea And Vomiting    After one pill at hospital  . Januvia [Sitagliptin Phosphate] Other (See Comments)    Swelling and edema see 4 13 OV    Past Medical History:  Diagnosis Date  . CAD (coronary artery disease)   . Colitis   . Colitis, indeterminate 01/31/2012  . COPD (chronic obstructive pulmonary disease) ([place])   . Depression 05/16/2017  . Depression with anxiety 05/30/2016  . Diverticulitis 02/10/2012  . DM (diabetes mellitus) ([place])   . Glaucoma   . Glaucoma   . History of ventricular fibrillation   . HTN (hypertension)   . Myocardial infarction ([place])    12/1998, 01/2011  . Osteoporosis   . PVD (peripheral vascular disease) ([place])     Past Surgical History:  Procedure Laterality Date  . ABDOMINAL HYSTERECTOMY     endometriosis  . CABG x 3  01/22/2011   Dr Cyndia Bent  . CARDIAC CATHETERIZATION  01/17/2011   Dr Arvella Merles  . LIPOMA EXCISION Left 02/08/2015   Procedure:  EXCISION OF LEFT THIGH LIPOMA;  Surgeon: Greer Pickerel, MD;  Location: [facility];  Service: General;  Laterality: Left;  . S/P aortobifemoral bypass  2008   Dr Donnetta Hutching  . S/P myocardial infarction and PCI of the lt circumflex  2000    Family History  Problem Relation Age of Onset  . Heart attack Father   . Diabetes Father   . Heart disease Father   . Diabetes Brother   . Diabetes Mother   . Liver cancer Mother   . Hypertension Unknown   . Thyroid disease Unknown        sisters- sister had parathyroidectomy  . Alcohol abuse Unknown        brother  . Colon polyps Maternal Uncle   . Breast cancer Sister   . Diabetes Sister     Social History:  reports that she has been smoking cigarettes.  She has been smoking about 0.40 packs per day. she has never used smokeless tobacco. She reports that she does not drink alcohol or use drugs.    Review of Systems    Lipid history: On 80 mg Lipitor because of history of CAD She says she is cutting back on smoking and her HDL looks better    Lab Results  Component Value Date   CHOL 127 06/22/2017   HDL 26.40 (L) 06/22/2017   LDLCALC 70 06/22/2017   LDLDIRECT 87.0 04/24/2015   TRIG 150.0 (H) 06/22/2017   CHOLHDL 5 06/22/2017           Hypertension: Present for several years Zestoretic  was reduced to half a tablet with starting Invokana and blood pressure is  Good  BP Readings from Last 3 Encounters:  06/25/17 120/70  02/23/17 116/76  02/03/17 124/78     She has had periodic mild hypercalcemia since about 2010; PTH levels in the mid-upper range Calcium is upper normal again at 10.2  She is taking 1000 units of vitamin D, Her last vitamin D level was 38 No history of fractures  Her last bone density was normal in 07/2015 except T score -2.3 at the wrist, no change in the bone density at the femur   Most recent eye exam was normal in 9/18  Most recent foot exam: 2/18  Review of Systems    LABS:  Lab on  06/22/2017  Component Date Value Ref Range Status  . Cholesterol 06/22/2017 127  0 - 200 mg/dL Final   ATP III Classification       Desirable:  < 200 mg/dL               Borderline High:  200 - 239 mg/dL          High:  > = 240 mg/dL  . Triglycerides 06/22/2017 150.0* 0.0 - 149.0 mg/dL Final   Normal:  <150 mg/dLBorderline High:  150 - 199 mg/dL  . HDL 06/22/2017 26.40* >39.00 mg/dL Final  . VLDL 06/22/2017 30.0  0.0 - 40.0 mg/dL Final  . LDL Cholesterol 06/22/2017 70  0 - 99 mg/dL Final  . Total CHOL/HDL Ratio 06/22/2017 5   Final                  Men          Women1/2 Average Risk     3.4          3.3Average Risk          5.0          4.42X Average Risk          9.6  7.13X Average Risk          15.0          11.0                      . NonHDL 06/22/2017 100.34   Final   NOTE:  Non-HDL goal should be 30 mg/dL higher than patient's LDL goal (i.e. LDL goal of < 70 mg/dL, would have non-HDL goal of < 100 mg/dL)  . Sodium 06/22/2017 136  135 - 145 mEq/L Final  . Potassium 06/22/2017 4.8  3.5 - 5.1 mEq/L Final  . Chloride 06/22/2017 102  96 - 112 mEq/L Final  . CO2 06/22/2017 27  19 - 32 mEq/L Final  . Glucose, Bld 06/22/2017 109* 70 - 99 mg/dL Final  . BUN 06/22/2017 17  6 - 23 mg/dL Final  . Creatinine, Ser 06/22/2017 0.73  0.40 - 1.20 mg/dL Final  . Total Bilirubin 06/22/2017 0.5  0.2 - 1.2 mg/dL Final  . Alkaline Phosphatase 06/22/2017 90  39 - 117 U/L Final  . AST 06/22/2017 19  0 - 37 U/L Final  . ALT 06/22/2017 20  0 - 35 U/L Final  . Total Protein 06/22/2017 7.2  6.0 - 8.3 g/dL Final  . Albumin 06/22/2017 4.1  3.5 - 5.2 g/dL Final  . Calcium 06/22/2017 10.2  8.4 - 10.5 mg/dL Final  . GFR 06/22/2017 84.91  >60.00 mL/min Final  . Hgb A1c MFr Bld 06/22/2017 6.5  4.6 - 6.5 % Final   Glycemic Control Guidelines for People with Diabetes:Non Diabetic:  <6%Goal of Therapy: <7%Additional Action Suggested:  >8%     Physical Examination:  BP 120/70   Pulse 82   Ht 5\' 4"  (1.626 m)    Wt 158 lb (71.7 kg)   SpO2 94%   BMI 27.12 kg/m        ASSESSMENT:  Diabetes type 2, BMI 28  See history of present illness for detailed discussion of his current management, blood sugar patterns and problems identified  She has excellent control with A1c down to 6.5 again Her blood sugars are very consistent throughout the day She is very compliant with her insulin regimen, Invokana, blood sugar monitoring and diet She is trying to exercise although not as much recently  HYPERCALCEMIA: Calcium is upper normal  and  Unchanged Discussed needing bone density again in February, this will be scheduled Vitamin D level to be checked on the next visit for her osteopenia  HYPERTENSION:  well-controlled with current regimen,Also followed by PCP   PLAN:   She will continue the same insulin regimen and Invokana She will follow-up in 4 months Meanwhile she will increase her exercise at the gym Other recommendations as above  There are no Patient Instructions on file for this visit.  [name] 06/25/2017, 8:17 AM   Note: This office note was prepared with Dragon voice recognition system technology. Any transcriptional errors that result from this process are unintentional.

## 2017-06-26 ENCOUNTER — Telehealth: Payer: Self-pay

## 2017-06-26 NOTE — Telephone Encounter (Signed)
Scheduled bone density for this patient for 07/24/17 at 10 am and I left the patient a vm letting her know about appointment and I left the number to reschedule if she needs to- that number for her to call is [phone]

## 2017-07-07 DIAGNOSIS — J209 Acute bronchitis, unspecified: Secondary | ICD-10-CM | POA: Diagnosis not present

## 2017-07-24 ENCOUNTER — Other Ambulatory Visit: Payer: PPO

## 2017-07-27 ENCOUNTER — Ambulatory Visit (INDEPENDENT_AMBULATORY_CARE_PROVIDER_SITE_OTHER)
Admission: RE | Admit: 2017-07-27 | Discharge: 2017-07-27 | Disposition: A | Discharge status: Home / Self Care | Payer: PPO | Source: Ambulatory Visit | Attending: Endocrinology | Admitting: Endocrinology

## 2017-07-27 ENCOUNTER — Other Ambulatory Visit: Payer: Self-pay | Admitting: Endocrinology

## 2017-07-27 DIAGNOSIS — M8589 Other specified disorders of bone density and structure, multiple sites: Secondary | ICD-10-CM

## 2017-08-01 NOTE — Progress Notes (Signed)
Please call to let patient know that the bone density is slightly lower than before but still not requiring any treatment Needs follow-up every 2 years

## 2017-08-03 ENCOUNTER — Telehealth: Payer: Self-pay

## 2017-08-03 NOTE — Telephone Encounter (Signed)
Spoke to patient. Gave results. Pt verbalized understanding.  

## 2017-08-03 NOTE — Telephone Encounter (Signed)
-----   Message from [name], MD sent at 08/01/2017  4:32 PM EST ----- Please call to let patient know that the bone density is slightly lower than before but still not requiring any treatment Needs follow-up every 2 years

## 2017-08-04 NOTE — Progress Notes (Signed)
Chief Complaint  Patient presents with  . Medicare Wellness    no new concerns, needs refills on proair and lisinopril     HPI: Kimberly [name] 67 y.o. comes in today for Preventive Medicare exam/ wellness visit .Since last visit.    Feels doing well DM in control  RESP had seen UC for bron chitis and had cxray and gien inhaler doing better   Tobacco  Down to 6 per day   Denies sob doe  Sx   bp controlled and dr Raliegh Ip told her to day 1/2 dosea bout a year ago and seems to be ok   Advise on  Dosing   CVD denies cp   Claudication .  Health Maintenance  Topic Date Due  . HIV Screening  12/21/1965  . INFLUENZA VACCINE  01/14/2017  . FOOT EXAM  07/24/2017  . COLONOSCOPY  04/22/2018 (Originally 04/24/2015)  . HEMOGLOBIN A1C  12/20/2017  . PNA vac Low Risk Adult (2 of 2 - PPSV23) 02/03/2018  . OPHTHALMOLOGY EXAM  02/24/2018  . TETANUS/TDAP  01/31/2019  . DEXA SCAN  Completed  . Hepatitis C Screening  Completed   Health Maintenance Review LIFESTYLE:  Exercise:   Fixing to joint gym   Tobacco/ETS:  6 pre day .   hwn had bronchitis  Alcohol:   no Sugar beverages: no Sleep:8-9  Thinks  has OSA   Small amount   Was told this in hospital snoring  In hospital  Drug use: no HH:  1 no sitting  4 days  A week    12 hours.   2 older  patients  Declines mammo  "Too painful and wouldn't fu anyway" Colon screen  Wouldn't do colonoscopy neg fam hx     Hearing:   Ok   Vision:  No limitations at present . Last eye check UTD  Safety:  Has smoke detector and wears seat belts.   Firearms.stored safely No excess sun exposure.  No dentist  All false  Teeth.   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  For this  Memory: Felt to be good  , no concern from her or her family.  Depression: No anhedonia unusual crying or depressive symptoms  Nutrition: Eats well balanced diet; adequate calcium and vitamin D. No swallowing chewing problems.  Injury: no major injuries  in the last six months.  Other healthcare providers:  Reviewed today .  Social:  Lives    Preventive parameters:   Reviewed   ADLS:   There are no problems or need for assistance  driving, feeding, obtaining food, dressing, toileting and bathing, managing money using phone. She is independent.   ROS:  GEN/ HEENT: No fever, significant weight changes sweats headaches vision problems hearing changes, CV/ PULM; No chest pain shortness of breath cough, syncope,edema  change in exercise tolerance. GI /GU: No adominal pain, vomiting, change in bowel habits. No blood in the stool. No significant GU symptoms. SKIN/HEME: ,no acute skin rashes suspicious lesions or bleeding. No lymphadenopathy, nodules, masses.  NEURO/ PSYCH:  No neurologic signs such as weakness numbness. No depression anxiety. IMM/ Allergy: No unusual infections.  Allergy .   REST of 12 system review negative except as per HPI   Past Medical History:  Diagnosis Date  . CAD (coronary artery disease)   . Colitis   . Colitis, indeterminate 01/31/2012  . COPD (chronic obstructive pulmonary disease) ([place])   . Depression 05/16/2017  .  Depression with anxiety 05/30/2016  . Diverticulitis 02/10/2012  . DM (diabetes mellitus) ([place])   . Glaucoma   . Glaucoma   . History of ventricular fibrillation   . HTN (hypertension)   . Myocardial infarction ([place])    12/1998, 01/2011  . Osteoporosis   . PVD (peripheral vascular disease) ([facility])     Family History  Problem Relation Age of Onset  . Heart attack Father   . Diabetes Father   . Heart disease Father   . Diabetes Brother   . Diabetes Mother   . Liver cancer Mother   . Hypertension Unknown   . Thyroid disease Unknown        sisters- sister had parathyroidectomy  . Alcohol abuse Unknown        brother  . Colon polyps Maternal Uncle   . Breast cancer Sister   . Diabetes Sister     Social History   Socioeconomic History  . Marital status: Married    Spouse name: None    . Number of children: 3  . Years of education: None  . Highest education level: None  Social Needs  . Financial resource strain: None  . Food insecurity - worry: None  . Food insecurity - inability: None  . Transportation needs - medical: None  . Transportation needs - non-medical: None  Occupational History  . Occupation: housewife    Employer: UNEMPLOYED  Tobacco Use  . Smoking status: Current Every Day Smoker    Packs/day: 0.40    Types: Cigarettes  . Smokeless tobacco: Never Used  Substance and Sexual Activity  . Alcohol use: No  . Drug use: No  . Sexual activity: None  Other Topics Concern  . None  Social History Narrative   t widowed    Regular exercise- yes not recently       Quit tobacco in August at heart surgery. Restarted    Husband diagnosed with liver cancer since 2014 passed April 2015    She was care taker.   No pets    Outpatient Encounter Medications as of 08/06/2017  Medication Sig  . albuterol (PROVENTIL HFA;VENTOLIN HFA) 108 (90 Base) MCG/ACT inhaler Inhale 2 puffs into the lungs every 6 (six) hours as needed. For shortness of breath or wheezing  . aspirin 325 MG EC tablet Take 325 mg by mouth every morning.   Marland Kitchen atorvastatin (LIPITOR) 80 MG tablet TAKE 1 TABLET BY MOUTH EVERY DAY  . glucose blood (CONTOUR NEXT TEST) test strip TEST AS DIRECTED UP TO FOUR TIMES DAILY Dx: E11.9  . Insulin Pen Needle (BD PEN NEEDLE NANO U/F) 32G X 4 MM MISC USE WITH LEVEMIR FLEX PEN  . INVOKANA 100 MG TABS tablet TAKE 1 TABLET BY MOUTH BEFORE BREAKFAST  . LANTUS SOLOSTAR 100 UNIT/ML Solostar Pen INJECT 37 UNITS UNDER THE SKIN AT BEDTIME  . lisinopril-hydrochlorothiazide (PRINZIDE,ZESTORETIC) 20-12.5 MG tablet Take 0.5 tablets by mouth daily. Increase to 1 po qd as directed  . NOVOLOG FLEXPEN 100 UNIT/ML FlexPen INJECT 20 UNITS INTO THE SKIN FOR 1 DOSE WITH LARGEST MEAL, THEN USE AS DIRECTED (Patient taking differently: INJECT 20 UNITS INTO THE SKIN FOR 1 DOSE WITH LARGEST  MEAL, THEN 10 UNITS LAST MEAL OF THE DAY)  . [DISCONTINUED] albuterol (PROVENTIL HFA;VENTOLIN HFA) 108 (90 BASE) MCG/ACT inhaler Inhale 2 puffs into the lungs every 6 (six) hours as needed. For shortness of breath or wheezing  . [DISCONTINUED] lisinopril-hydrochlorothiazide (PRINZIDE,ZESTORETIC) 20-12.5 MG tablet Take 0.5 tablets by mouth daily.  No facility-administered encounter medications on file as of 08/06/2017.     EXAM:  BP 138/66 (BP Location: Right Arm, Patient Position: Sitting, Cuff Size: Normal)   Pulse 100   Temp 97.9 F (36.6 C) (Oral)   Ht 5' 3.25" (1.607 m)   Wt 154 lb 12.8 oz (70.2 kg)   SpO2 95%   BMI 27.21 kg/m   Body mass index is 27.21 kg/m.  Physical Exam: Vital signs reviewed NWG:NFAO is a well-developed well-nourished alert cooperative   who appears stated age in no acute distress.  HEENT: normocephalic atraumatic , Eyes: PERRL EOM's full, conjunctiva clear, Nares: paten,t no deformity discharge or tenderness., Ears: no deformity EAC's clear TMs with normal landmarks. Mouth: clear OP, no lesions, edema.  Moist mucous membranes. Dentition dentures  NECK: supple without masses, thyromegaly or bruits. CHEST/PULM:  Clear to auscultation and percussion breath sounds equal no wheeze , rales or rhonchi. No chest Mcdonagh deformities or tenderness. CV: PMI is nondisplaced, S1 S2 no gallops,  Rubs short sem lusb no radiation pulse 90 and reg . Peripheral pulses are present 1-2 without delay.No JVD .  ABDOMEN: Bowel sounds normal nontender  No guard or rebound, no hepato splenomegal no CVA tenderness.  Breast: normal by inspection . No dimpling, discharge, masses, tenderness or discharge . Extremtities:  No cyanosis or edema, no acute joint swelling or redness no focal atrophy  Fingers look like slight early club but no hypertrophy and pt says whole family with same nails  NEURO:  Oriented x3, cranial nerves 3-12 appear to be intact, no obvious focal weakness,gait within  normal limits no abnormal reflexes or asymmetrical SKIN: No acute rashes normal turgor, color, no bruising or petechiae. PSYCH: Oriented, good eye contact, no obvious depression anxiety, cognition and judgment appear normal. LN: no cervical axillary inguinal adenopathy No noted deficits in memory, attention, and speech. Diabetic Foot Exam - Simple   Simple Foot Form Diabetic Foot exam was performed with the following findings:  Yes 08/06/2017  9:45 AM  Visual Inspection No deformities, no ulcerations, no other skin breakdown bilaterally:  Yes Sensation Testing Intact to touch and monofilament testing bilaterally:  Yes Pulse Check Posterior Tibialis and Dorsalis pulse intact bilaterally:  Yes Comments      Lab Results  Component Value Date   WBC 6.3 04/24/2015   HGB 17.0 (H) 08/29/2016   HCT 50.0 (H) 08/29/2016   PLT 141.0 (L) 04/24/2015   GLUCOSE 109 (H) 06/22/2017   CHOL 127 06/22/2017   TRIG 150.0 (H) 06/22/2017   HDL 26.40 (L) 06/22/2017   LDLDIRECT 87.0 04/24/2015   LDLCALC 70 06/22/2017   ALT 20 06/22/2017   AST 19 06/22/2017   NA 136 06/22/2017   K 4.8 06/22/2017   CL 102 06/22/2017   CREATININE 0.73 06/22/2017   BUN 17 06/22/2017   CO2 27 06/22/2017   TSH 4.13 07/17/2015   INR 1.06 02/01/2012   HGBA1C 6.5 06/22/2017   MICROALBUR 1.4 02/19/2017    ASSESSMENT AND PLAN:  Discussed the following assessment and plan:  Welcome to Medicare preventive visit  Medication management  Tobacco use - down to 6 per day  - Plan: CBC with Differential/Platelet  Elevated hemoglobin ([facility]) - poss secondary related to tobacco and asymptomatic lung ? mild osa defined . consider wmore eval if ongoing . states no sx see text.  - Plan: CBC with Differential/Platelet  SYSTOLIC MURMUR  PERIPHERAL VASCULAR DISEASE  Mammogram declined  Screening declined by patient - colon cancer  testing   Essential hypertension - controlled   cont on same dose  can increas if we are not at  goal   Diabetes mellitus with peripheral vascular disease ([name]) disc screening  She is feeling quite well today but  Advise fu of elevated hg for reasons disc  Consider pulm eval  .  Patient Care Team: Panosh, Standley Brooking, MD as PCP - General Cyndia Bent, Fernande Boyden, MD (Cardiothoracic Surgery) Early, Arvilla Meres, MD (Thoracic Diseases) [name], MD as Consulting Physician (Endocrinology)  Patient Instructions  Glad you are doing well.   Will let you know   resut sof blood count  If still very off we may need to do more of a work up  Such as checking the lung function tests again .  Etc   Will follow you levels and  Heart murmur .   Ok to satay on same dose of bp medication. If needing ot use inhaler   We need to  Do the lung function tests .     Health Maintenance, Female Adopting a healthy lifestyle and getting preventive care can go a long way to promote health and wellness. Talk with your health care provider about what schedule of regular examinations is right for you. This is a good chance for you to check in with your provider about disease prevention and staying healthy. In between checkups, there are plenty of things you can do on your own. Experts have done a lot of research about which lifestyle changes and preventive measures are most likely to keep you healthy. Ask your health care provider for more information. Weight and diet Eat a healthy diet  Be sure to include plenty of vegetables, fruits, low-fat dairy products, and lean protein.  Do not eat a lot of foods high in solid fats, added sugars, or salt.  Get regular exercise. This is one of the most important things you can do for your health. ? Most adults should exercise for at least 150 minutes each week. The exercise should increase your heart rate and make you sweat (moderate-intensity exercise). ? Most adults should also do strengthening exercises at least twice a week. This is in addition to the moderate-intensity  exercise.  Maintain a healthy weight  Body mass index (BMI) is a measurement that can be used to identify possible weight problems. It estimates body fat based on height and weight. Your health care provider can help determine your BMI and help you achieve or maintain a healthy weight.  For females 51 years of age and older: ? A BMI below 18.5 is considered underweight. ? A BMI of 18.5 to 24.9 is normal. ? A BMI of 25 to 29.9 is considered overweight. ? A BMI of 30 and above is considered obese.  Watch levels of cholesterol and blood lipids  You should start having your blood tested for lipids and cholesterol at 66 years of age, then have this test every 5 years.  You may need to have your cholesterol levels checked more often if: ? Your lipid or cholesterol levels are high. ? You are older than 66 years of age. ? You are at high risk for heart disease.  Cancer screening Lung Cancer  Lung cancer screening is recommended for adults 32-83 years old who are at high risk for lung cancer because of a history of smoking.  A yearly low-dose CT scan of the lungs is recommended for people who: ? Currently smoke. ? Have quit within the past  15 years. ? Have at least a 30-pack-year history of smoking. A pack year is smoking an average of one pack of cigarettes a day for 1 year.  Yearly screening should continue until it has been 15 years since you quit.  Yearly screening should stop if you develop a health problem that would prevent you from having lung cancer treatment.  Breast Cancer  Practice breast self-awareness. This means understanding how your breasts normally appear and feel.  It also means doing regular breast self-exams. Let your health care provider know about any changes, no matter how small.  If you are in your 20s or 30s, you should have a clinical breast exam (CBE) by a health care provider every 1-3 years as part of a regular health exam.  If you are 46 or older, have  a CBE every year. Also consider having a breast X-ray (mammogram) every year.  If you have a family history of breast cancer, talk to your health care provider about genetic screening.  If you are at high risk for breast cancer, talk to your health care provider about having an MRI and a mammogram every year.  Breast cancer gene (BRCA) assessment is recommended for women who have family members with BRCA-related cancers. BRCA-related cancers include: ? Breast. ? Ovarian. ? Tubal. ? Peritoneal cancers.  Results of the assessment will determine the need for genetic counseling and BRCA1 and BRCA2 testing.  Cervical Cancer Your health care provider may recommend that you be screened regularly for cancer of the pelvic organs (ovaries, uterus, and vagina). This screening involves a pelvic examination, including checking for microscopic changes to the surface of your cervix (Pap test). You may be encouraged to have this screening done every 3 years, beginning at age 50.  For women ages 32-65, health care providers may recommend pelvic exams and Pap testing every 3 years, or they may recommend the Pap and pelvic exam, combined with testing for human papilloma virus (HPV), every 5 years. Some types of HPV increase your risk of cervical cancer. Testing for HPV may also be done on women of any age with unclear Pap test results.  Other health care providers may not recommend any screening for nonpregnant women who are considered low risk for pelvic cancer and who do not have symptoms. Ask your health care provider if a screening pelvic exam is right for you.  If you have had past treatment for cervical cancer or a condition that could lead to cancer, you need Pap tests and screening for cancer for at least 20 years after your treatment. If Pap tests have been discontinued, your risk factors (such as having a new sexual partner) need to be reassessed to determine if screening should resume. Some women have  medical problems that increase the chance of getting cervical cancer. In these cases, your health care provider may recommend more frequent screening and Pap tests.  Colorectal Cancer  This type of cancer can be detected and often prevented.  Routine colorectal cancer screening usually begins at 66 years of age and continues through 66 years of age.  Your health care provider may recommend screening at an earlier age if you have risk factors for colon cancer.  Your health care provider may also recommend using home test kits to check for hidden blood in the stool.  A small camera at the end of a tube can be used to examine your colon directly (sigmoidoscopy or colonoscopy). This is done to check for the earliest  forms of colorectal cancer.  Routine screening usually begins at age 71.  Direct examination of the colon should be repeated every 5-10 years through 66 years of age. However, you may need to be screened more often if early forms of precancerous polyps or small growths are found.  Skin Cancer  Check your skin from head to toe regularly.  Tell your health care provider about any new moles or changes in moles, especially if there is a change in a mole's shape or color.  Also tell your health care provider if you have a mole that is larger than the size of a pencil eraser.  Always use sunscreen. Apply sunscreen liberally and repeatedly throughout the day.  Protect yourself by wearing long sleeves, pants, a wide-brimmed hat, and sunglasses whenever you are outside.  Heart disease, diabetes, and high blood pressure  High blood pressure causes heart disease and increases the risk of stroke. High blood pressure is more likely to develop in: ? People who have blood pressure in the high end of the normal range (130-139/85-89 mm Hg). ? People who are overweight or obese. ? People who are African American.  If you are 53-77 years of age, have your blood pressure checked every 3-5  years. If you are 35 years of age or older, have your blood pressure checked every year. You should have your blood pressure measured twice-once when you are at a hospital or clinic, and once when you are not at a hospital or clinic. Record the average of the two measurements. To check your blood pressure when you are not at a hospital or clinic, you can use: ? An automated blood pressure machine at a pharmacy. ? A home blood pressure monitor.  If you are between 91 years and 26 years old, ask your health care provider if you should take aspirin to prevent strokes.  Have regular diabetes screenings. This involves taking a blood sample to check your fasting blood sugar level. ? If you are at a normal weight and have a low risk for diabetes, have this test once every three years after 66 years of age. ? If you are overweight and have a high risk for diabetes, consider being tested at a younger age or more often. Preventing infection Hepatitis B  If you have a higher risk for hepatitis B, you should be screened for this virus. You are considered at high risk for hepatitis B if: ? You were born in a country where hepatitis B is common. Ask your health care provider which countries are considered high risk. ? Your parents were born in a high-risk country, and you have not been immunized against hepatitis B (hepatitis B vaccine). ? You have HIV or AIDS. ? You use needles to inject street drugs. ? You live with someone who has hepatitis B. ? You have had sex with someone who has hepatitis B. ? You get hemodialysis treatment. ? You take certain medicines for conditions, including cancer, organ transplantation, and autoimmune conditions.  Hepatitis C  Blood testing is recommended for: ? Everyone born from [birth year] through 1964. ? Anyone with known risk factors for hepatitis C.  Sexually transmitted infections (STIs)  You should be screened for sexually transmitted infections (STIs) including  gonorrhea and chlamydia if: ? You are sexually active and are younger than 66 years of age. ? You are older than 66 years of age and your health care provider tells you that you are at risk for this type of  infection. ? Your sexual activity has changed since you were last screened and you are at an increased risk for chlamydia or gonorrhea. Ask your health care provider if you are at risk.  If you do not have HIV, but are at risk, it may be recommended that you take a prescription medicine daily to prevent HIV infection. This is called pre-exposure prophylaxis (PrEP). You are considered at risk if: ? You are sexually active and do not regularly use condoms or know the HIV status of your partner(s). ? You take drugs by injection. ? You are sexually active with a partner who has HIV.  Talk with your health care provider about whether you are at high risk of being infected with HIV. If you choose to begin PrEP, you should first be tested for HIV. You should then be tested every 3 months for as long as you are taking PrEP. Pregnancy  If you are premenopausal and you may become pregnant, ask your health care provider about preconception counseling.  If you may become pregnant, take 400 to 800 micrograms (mcg) of folic acid every day.  If you want to prevent pregnancy, talk to your health care provider about birth control (contraception). Osteoporosis and menopause  Osteoporosis is a disease in which the bones lose minerals and strength with aging. This can result in serious bone fractures. Your risk for osteoporosis can be identified using a bone density scan.  If you are 84 years of age or older, or if you are at risk for osteoporosis and fractures, ask your health care provider if you should be screened.  Ask your health care provider whether you should take a calcium or vitamin D supplement to lower your risk for osteoporosis.  Menopause may have certain physical symptoms and  risks.  Hormone replacement therapy may reduce some of these symptoms and risks. Talk to your health care provider about whether hormone replacement therapy is right for you. Follow these instructions at home:  Schedule regular health, dental, and eye exams.  Stay current with your immunizations.  Do not use any tobacco products including cigarettes, chewing tobacco, or electronic cigarettes.  If you are pregnant, do not drink alcohol.  If you are breastfeeding, limit how much and how often you drink alcohol.  Limit alcohol intake to no more than 1 drink per day for nonpregnant women. One drink equals 12 ounces of beer, 5 ounces of wine, or 1 ounces of hard liquor.  Do not use street drugs.  Do not share needles.  Ask your health care provider for help if you need support or information about quitting drugs.  Tell your health care provider if you often feel depressed.  Tell your health care provider if you have ever been abused or do not feel safe at home. This information is not intended to replace advice given to you by your health care provider. Make sure you discuss any questions you have with your health care provider. Document Released: 12/16/2010 Document Revised: 11/08/2015 Document Reviewed: 03/06/2015 Elsevier Interactive Patient Education  2018 [employer]. Panosh M.D.

## 2017-08-06 ENCOUNTER — Ambulatory Visit (INDEPENDENT_AMBULATORY_CARE_PROVIDER_SITE_OTHER): Payer: PPO | Admitting: Internal Medicine

## 2017-08-06 ENCOUNTER — Encounter: Payer: Self-pay | Admitting: Internal Medicine

## 2017-08-06 VITALS — BP 138/66 | HR 100 | Temp 97.9°F | Ht 63.25 in | Wt 154.8 lb

## 2017-08-06 DIAGNOSIS — Z720 Tobacco use: Secondary | ICD-10-CM | POA: Diagnosis not present

## 2017-08-06 DIAGNOSIS — I739 Peripheral vascular disease, unspecified: Secondary | ICD-10-CM | POA: Diagnosis not present

## 2017-08-06 DIAGNOSIS — R011 Cardiac murmur, unspecified: Secondary | ICD-10-CM | POA: Diagnosis not present

## 2017-08-06 DIAGNOSIS — Z0000 Encounter for general adult medical examination without abnormal findings: Secondary | ICD-10-CM | POA: Diagnosis not present

## 2017-08-06 DIAGNOSIS — E1151 Type 2 diabetes mellitus with diabetic peripheral angiopathy without gangrene: Secondary | ICD-10-CM | POA: Diagnosis not present

## 2017-08-06 DIAGNOSIS — Z79899 Other long term (current) drug therapy: Secondary | ICD-10-CM

## 2017-08-06 DIAGNOSIS — I10 Essential (primary) hypertension: Secondary | ICD-10-CM

## 2017-08-06 DIAGNOSIS — Z5320 Procedure and treatment not carried out because of patient's decision for unspecified reasons: Secondary | ICD-10-CM

## 2017-08-06 DIAGNOSIS — D582 Other hemoglobinopathies: Secondary | ICD-10-CM | POA: Diagnosis not present

## 2017-08-06 LAB — CBC WITH DIFFERENTIAL/PLATELET
BASOS ABS: 0 10*3/uL (ref 0.0–0.1)
Basophils Relative: 0.6 % (ref 0.0–3.0)
Eosinophils Absolute: 0.2 10*3/uL (ref 0.0–0.7)
Eosinophils Relative: 2.8 % (ref 0.0–5.0)
HCT: 48.8 % — ABNORMAL HIGH (ref 36.0–46.0)
HEMOGLOBIN: 16.7 g/dL — AB (ref 12.0–15.0)
LYMPHS ABS: 1.9 10*3/uL (ref 0.7–4.0)
Lymphocytes Relative: 23.9 % (ref 12.0–46.0)
MCHC: 34.2 g/dL (ref 30.0–36.0)
MCV: 87 fl (ref 78.0–100.0)
MONO ABS: 0.7 10*3/uL (ref 0.1–1.0)
MONOS PCT: 8.3 % (ref 3.0–12.0)
NEUTROS PCT: 64.4 % (ref 43.0–77.0)
Neutro Abs: 5.1 10*3/uL (ref 1.4–7.7)
Platelets: 191 10*3/uL (ref 150.0–400.0)
RBC: 5.61 Mil/uL — AB (ref 3.87–5.11)
RDW: 16.1 % — ABNORMAL HIGH (ref 11.5–15.5)
WBC: 7.9 10*3/uL (ref 4.0–10.5)

## 2017-08-06 MED ORDER — LISINOPRIL-HYDROCHLOROTHIAZIDE 20-12.5 MG PO TABS: 0.5000 | ORAL_TABLET | Freq: Every day | ORAL | 3 refills | Status: DC

## 2017-08-06 MED ORDER — ALBUTEROL SULFATE HFA 108 (90 BASE) MCG/ACT IN AERS: 2.0000 | INHALATION_SPRAY | Freq: Four times a day (QID) | RESPIRATORY_TRACT | 2 refills | Status: DC | PRN

## 2017-08-06 NOTE — Patient Instructions (Addendum)
Glad you are doing well.   Will let you know   resut sof blood count  If still very off we may need to do more of a work up  Such as checking the lung function tests again .  Etc   Will follow you levels and  Heart murmur .   Ok to satay on same dose of bp medication. If needing ot use inhaler   We need to  Do the lung function tests .     Health Maintenance, Female Adopting a healthy lifestyle and getting preventive care can go a long way to promote health and wellness. Talk with your health care provider about what schedule of regular examinations is right for you. This is a good chance for you to check in with your provider about disease prevention and staying healthy. In between checkups, there are plenty of things you can do on your own. Experts have done a lot of research about which lifestyle changes and preventive measures are most likely to keep you healthy. Ask your health care provider for more information. Weight and diet Eat a healthy diet  Be sure to include plenty of vegetables, fruits, low-fat dairy products, and lean protein.  Do not eat a lot of foods high in solid fats, added sugars, or salt.  Get regular exercise. This is one of the most important things you can do for your health. ? Most adults should exercise for at least 150 minutes each week. The exercise should increase your heart rate and make you sweat (moderate-intensity exercise). ? Most adults should also do strengthening exercises at least twice a week. This is in addition to the moderate-intensity exercise.  Maintain a healthy weight  Body mass index (BMI) is a measurement that can be used to identify possible weight problems. It estimates body fat based on height and weight. Your health care provider can help determine your BMI and help you achieve or maintain a healthy weight.  For females 27 years of age and older: ? A BMI below 18.5 is considered underweight. ? A BMI of 18.5 to 24.9 is normal. ? A  BMI of 25 to 29.9 is considered overweight. ? A BMI of 30 and above is considered obese.  Watch levels of cholesterol and blood lipids  You should start having your blood tested for lipids and cholesterol at 66 years of age, then have this test every 5 years.  You may need to have your cholesterol levels checked more often if: ? Your lipid or cholesterol levels are high. ? You are older than 66 years of age. ? You are at high risk for heart disease.  Cancer screening Lung Cancer  Lung cancer screening is recommended for adults 72-74 years old who are at high risk for lung cancer because of a history of smoking.  A yearly low-dose CT scan of the lungs is recommended for people who: ? Currently smoke. ? Have quit within the past 15 years. ? Have at least a 30-pack-year history of smoking. A pack year is smoking an average of one pack of cigarettes a day for 1 year.  Yearly screening should continue until it has been 15 years since you quit.  Yearly screening should stop if you develop a health problem that would prevent you from having lung cancer treatment.  Breast Cancer  Practice breast self-awareness. This means understanding how your breasts normally appear and feel.  It also means doing regular breast self-exams. Let your health care provider  know about any changes, no matter how small.  If you are in your 20s or 30s, you should have a clinical breast exam (CBE) by a health care provider every 1-3 years as part of a regular health exam.  If you are 40 or older, have a CBE every year. Also consider having a breast X-ray (mammogram) every year.  If you have a family history of breast cancer, talk to your health care provider about genetic screening.  If you are at high risk for breast cancer, talk to your health care provider about having an MRI and a mammogram every year.  Breast cancer gene (BRCA) assessment is recommended for women who have family members with  BRCA-related cancers. BRCA-related cancers include: ? Breast. ? Ovarian. ? Tubal. ? Peritoneal cancers.  Results of the assessment will determine the need for genetic counseling and BRCA1 and BRCA2 testing.  Cervical Cancer Your health care provider may recommend that you be screened regularly for cancer of the pelvic organs (ovaries, uterus, and vagina). This screening involves a pelvic examination, including checking for microscopic changes to the surface of your cervix (Pap test). You may be encouraged to have this screening done every 3 years, beginning at age 21.  For women ages 30-65, health care providers may recommend pelvic exams and Pap testing every 3 years, or they may recommend the Pap and pelvic exam, combined with testing for human papilloma virus (HPV), every 5 years. Some types of HPV increase your risk of cervical cancer. Testing for HPV may also be done on women of any age with unclear Pap test results.  Other health care providers may not recommend any screening for nonpregnant women who are considered low risk for pelvic cancer and who do not have symptoms. Ask your health care provider if a screening pelvic exam is right for you.  If you have had past treatment for cervical cancer or a condition that could lead to cancer, you need Pap tests and screening for cancer for at least 20 years after your treatment. If Pap tests have been discontinued, your risk factors (such as having a new sexual partner) need to be reassessed to determine if screening should resume. Some women have medical problems that increase the chance of getting cervical cancer. In these cases, your health care provider may recommend more frequent screening and Pap tests.  Colorectal Cancer  This type of cancer can be detected and often prevented.  Routine colorectal cancer screening usually begins at 66 years of age and continues through 66 years of age.  Your health care provider may recommend screening  at an earlier age if you have risk factors for colon cancer.  Your health care provider may also recommend using home test kits to check for hidden blood in the stool.  A small camera at the end of a tube can be used to examine your colon directly (sigmoidoscopy or colonoscopy). This is done to check for the earliest forms of colorectal cancer.  Routine screening usually begins at age 50.  Direct examination of the colon should be repeated every 5-10 years through 66 years of age. However, you may need to be screened more often if early forms of precancerous polyps or small growths are found.  Skin Cancer  Check your skin from head to toe regularly.  Tell your health care provider about any new moles or changes in moles, especially if there is a change in a mole's shape or color.  Also tell your health   care provider if you have a mole that is larger than the size of a pencil eraser.  Always use sunscreen. Apply sunscreen liberally and repeatedly throughout the day.  Protect yourself by wearing long sleeves, pants, a wide-brimmed hat, and sunglasses whenever you are outside.  Heart disease, diabetes, and high blood pressure  High blood pressure causes heart disease and increases the risk of stroke. High blood pressure is more likely to develop in: ? People who have blood pressure in the high end of the normal range (130-139/85-89 mm Hg). ? People who are overweight or obese. ? People who are African American.  If you are 18-39 years of age, have your blood pressure checked every 3-5 years. If you are 40 years of age or older, have your blood pressure checked every year. You should have your blood pressure measured twice-once when you are at a hospital or clinic, and once when you are not at a hospital or clinic. Record the average of the two measurements. To check your blood pressure when you are not at a hospital or clinic, you can use: ? An automated blood pressure machine at a  pharmacy. ? A home blood pressure monitor.  If you are between 55 years and 79 years old, ask your health care provider if you should take aspirin to prevent strokes.  Have regular diabetes screenings. This involves taking a blood sample to check your fasting blood sugar level. ? If you are at a normal weight and have a low risk for diabetes, have this test once every three years after 66 years of age. ? If you are overweight and have a high risk for diabetes, consider being tested at a younger age or more often. Preventing infection Hepatitis B  If you have a higher risk for hepatitis B, you should be screened for this virus. You are considered at high risk for hepatitis B if: ? You were born in a country where hepatitis B is common. Ask your health care provider which countries are considered high risk. ? Your parents were born in a high-risk country, and you have not been immunized against hepatitis B (hepatitis B vaccine). ? You have HIV or AIDS. ? You use needles to inject street drugs. ? You live with someone who has hepatitis B. ? You have had sex with someone who has hepatitis B. ? You get hemodialysis treatment. ? You take certain medicines for conditions, including cancer, organ transplantation, and autoimmune conditions.  Hepatitis C  Blood testing is recommended for: ? Everyone born from 1945 through 1965. ? Anyone with known risk factors for hepatitis C.  Sexually transmitted infections (STIs)  You should be screened for sexually transmitted infections (STIs) including gonorrhea and chlamydia if: ? You are sexually active and are younger than 66 years of age. ? You are older than 66 years of age and your health care provider tells you that you are at risk for this type of infection. ? Your sexual activity has changed since you were last screened and you are at an increased risk for chlamydia or gonorrhea. Ask your health care provider if you are at risk.  If you do not  have HIV, but are at risk, it may be recommended that you take a prescription medicine daily to prevent HIV infection. This is called pre-exposure prophylaxis (PrEP). You are considered at risk if: ? You are sexually active and do not regularly use condoms or know the HIV status of your partner(s). ? You   take drugs by injection. ? You are sexually active with a partner who has HIV.  Talk with your health care provider about whether you are at high risk of being infected with HIV. If you choose to begin PrEP, you should first be tested for HIV. You should then be tested every 3 months for as long as you are taking PrEP. Pregnancy  If you are premenopausal and you may become pregnant, ask your health care provider about preconception counseling.  If you may become pregnant, take 400 to 800 micrograms (mcg) of folic acid every day.  If you want to prevent pregnancy, talk to your health care provider about birth control (contraception). Osteoporosis and menopause  Osteoporosis is a disease in which the bones lose minerals and strength with aging. This can result in serious bone fractures. Your risk for osteoporosis can be identified using a bone density scan.  If you are 31 years of age or older, or if you are at risk for osteoporosis and fractures, ask your health care provider if you should be screened.  Ask your health care provider whether you should take a calcium or vitamin D supplement to lower your risk for osteoporosis.  Menopause may have certain physical symptoms and risks.  Hormone replacement therapy may reduce some of these symptoms and risks. Talk to your health care provider about whether hormone replacement therapy is right for you. Follow these instructions at home:  Schedule regular health, dental, and eye exams.  Stay current with your immunizations.  Do not use any tobacco products including cigarettes, chewing tobacco, or electronic cigarettes.  If you are pregnant,  do not drink alcohol.  If you are breastfeeding, limit how much and how often you drink alcohol.  Limit alcohol intake to no more than 1 drink per day for nonpregnant women. One drink equals 12 ounces of beer, 5 ounces of wine, or 1 ounces of hard liquor.  Do not use street drugs.  Do not share needles.  Ask your health care provider for help if you need support or information about quitting drugs.  Tell your health care provider if you often feel depressed.  Tell your health care provider if you have ever been abused or do not feel safe at home. This information is not intended to replace advice given to you by your health care provider. Make sure you discuss any questions you have with your health care provider. Document Released: 12/16/2010 Document Revised: 11/08/2015 Document Reviewed: 03/06/2015 Elsevier Interactive Patient Education  [employer].

## 2017-08-17 ENCOUNTER — Other Ambulatory Visit: Payer: Self-pay | Admitting: Internal Medicine

## 2017-08-19 ENCOUNTER — Other Ambulatory Visit: Payer: Self-pay | Admitting: Internal Medicine

## 2017-08-19 DIAGNOSIS — D751 Secondary polycythemia: Secondary | ICD-10-CM

## 2017-08-26 ENCOUNTER — Other Ambulatory Visit: Payer: Self-pay | Admitting: *Deleted

## 2017-08-26 DIAGNOSIS — D582 Other hemoglobinopathies: Secondary | ICD-10-CM

## 2017-08-26 DIAGNOSIS — Z720 Tobacco use: Secondary | ICD-10-CM

## 2017-09-12 ENCOUNTER — Other Ambulatory Visit: Payer: Self-pay | Admitting: Endocrinology

## 2017-09-21 ENCOUNTER — Ambulatory Visit (INDEPENDENT_AMBULATORY_CARE_PROVIDER_SITE_OTHER): Payer: PPO | Admitting: Internal Medicine

## 2017-09-21 DIAGNOSIS — Z720 Tobacco use: Secondary | ICD-10-CM

## 2017-09-21 DIAGNOSIS — D582 Other hemoglobinopathies: Secondary | ICD-10-CM

## 2017-09-21 LAB — PULMONARY FUNCTION TEST
DL/VA % PRED: 66 %
DL/VA: 3.23 ml/min/mmHg/L
DLCO UNC % PRED: 59 %
DLCO UNC: 14.71 ml/min/mmHg
FEF 25-75 POST: 2.34 L/s
FEF 25-75 PRE: 1.79 L/s
FEF2575-%Change-Post: 30 %
FEF2575-%PRED-PRE: 84 %
FEF2575-%Pred-Post: 110 %
FEV1-%Change-Post: 11 %
FEV1-%Pred-Post: 79 %
FEV1-%Pred-Pre: 71 %
FEV1-POST: 1.93 L
FEV1-Pre: 1.73 L
FEV1FVC-%Change-Post: 3 %
FEV1FVC-%PRED-PRE: 103 %
FEV6-%CHANGE-POST: 9 %
FEV6-%Pred-Post: 77 %
FEV6-%Pred-Pre: 70 %
FEV6-Post: 2.34 L
FEV6-Pre: 2.14 L
FEV6FVC-%CHANGE-POST: 0 %
FEV6FVC-%Pred-Post: 104 %
FEV6FVC-%Pred-Pre: 103 %
FVC-%Change-Post: 7 %
FVC-%Pred-Post: 73 %
FVC-%Pred-Pre: 68 %
FVC-Post: 2.34 L
FVC-Pre: 2.17 L
POST FEV1/FVC RATIO: 82 %
PRE FEV1/FVC RATIO: 80 %
Post FEV6/FVC ratio: 100 %
Pre FEV6/FVC Ratio: 100 %
RV % PRED: 121 %
RV: 2.57 L
TLC % pred: 96 %
TLC: 4.9 L

## 2017-09-21 NOTE — Progress Notes (Signed)
PFT done today. 

## 2017-09-24 ENCOUNTER — Other Ambulatory Visit (INDEPENDENT_AMBULATORY_CARE_PROVIDER_SITE_OTHER): Payer: PPO

## 2017-09-24 DIAGNOSIS — D751 Secondary polycythemia: Secondary | ICD-10-CM | POA: Diagnosis not present

## 2017-09-24 LAB — CBC WITH DIFFERENTIAL/PLATELET
BASOS ABS: 0 10*3/uL (ref 0.0–0.1)
Basophils Relative: 0.5 % (ref 0.0–3.0)
EOS PCT: 2.6 % (ref 0.0–5.0)
Eosinophils Absolute: 0.2 10*3/uL (ref 0.0–0.7)
HEMATOCRIT: 47 % — AB (ref 36.0–46.0)
HEMOGLOBIN: 16 g/dL — AB (ref 12.0–15.0)
LYMPHS PCT: 19.8 % (ref 12.0–46.0)
Lymphs Abs: 1.2 10*3/uL (ref 0.7–4.0)
MCHC: 34.1 g/dL (ref 30.0–36.0)
MCV: 86.4 fl (ref 78.0–100.0)
MONOS PCT: 6.5 % (ref 3.0–12.0)
Monocytes Absolute: 0.4 10*3/uL (ref 0.1–1.0)
NEUTROS PCT: 70.6 % (ref 43.0–77.0)
Neutro Abs: 4.2 10*3/uL (ref 1.4–7.7)
Platelets: 160 10*3/uL (ref 150.0–400.0)
RBC: 5.43 Mil/uL — ABNORMAL HIGH (ref 3.87–5.11)
RDW: 15.7 % — ABNORMAL HIGH (ref 11.5–15.5)
WBC: 6 10*3/uL (ref 4.0–10.5)

## 2017-09-28 LAB — ERYTHROPOIETIN: ERYTHROPOIETIN: 26.2 m[IU]/mL — AB (ref 2.6–18.5)

## 2017-10-07 ENCOUNTER — Other Ambulatory Visit: Payer: Self-pay | Admitting: *Deleted

## 2017-10-07 DIAGNOSIS — R718 Other abnormality of red blood cells: Secondary | ICD-10-CM

## 2017-10-07 DIAGNOSIS — D582 Other hemoglobinopathies: Secondary | ICD-10-CM

## 2017-10-14 ENCOUNTER — Other Ambulatory Visit: Payer: Self-pay | Admitting: Internal Medicine

## 2017-10-20 ENCOUNTER — Other Ambulatory Visit (INDEPENDENT_AMBULATORY_CARE_PROVIDER_SITE_OTHER): Payer: PPO

## 2017-10-20 DIAGNOSIS — E559 Vitamin D deficiency, unspecified: Secondary | ICD-10-CM | POA: Diagnosis not present

## 2017-10-20 DIAGNOSIS — E119 Type 2 diabetes mellitus without complications: Secondary | ICD-10-CM

## 2017-10-20 LAB — COMPREHENSIVE METABOLIC PANEL
ALK PHOS: 84 U/L (ref 39–117)
ALT: 18 U/L (ref 0–35)
AST: 18 U/L (ref 0–37)
Albumin: 4.1 g/dL (ref 3.5–5.2)
BILIRUBIN TOTAL: 0.5 mg/dL (ref 0.2–1.2)
BUN: 19 mg/dL (ref 6–23)
CO2: 28 mEq/L (ref 19–32)
Calcium: 10.6 mg/dL — ABNORMAL HIGH (ref 8.4–10.5)
Chloride: 102 mEq/L (ref 96–112)
Creatinine, Ser: 0.73 mg/dL (ref 0.40–1.20)
GFR: 84.82 mL/min (ref >60.00)
Glucose, Bld: 129 mg/dL — ABNORMAL HIGH (ref 70–99)
POTASSIUM: 5.1 meq/L (ref 3.5–5.1)
Sodium: 138 mEq/L (ref 135–145)
Total Protein: 7.4 g/dL (ref 6.0–8.3)

## 2017-10-20 LAB — VITAMIN D 25 HYDROXY (VIT D DEFICIENCY, FRACTURES): VITD: 53.52 ng/mL (ref 30.00–100.00)

## 2017-10-20 LAB — HEMOGLOBIN A1C: HEMOGLOBIN A1C: 6.7 % — AB (ref 4.6–6.5)

## 2017-10-23 ENCOUNTER — Encounter: Payer: Self-pay | Admitting: Endocrinology

## 2017-10-23 ENCOUNTER — Ambulatory Visit (INDEPENDENT_AMBULATORY_CARE_PROVIDER_SITE_OTHER): Payer: PPO | Admitting: Endocrinology

## 2017-10-23 VITALS — BP 112/64 | HR 70 | Ht 63.25 in | Wt 158.0 lb

## 2017-10-23 DIAGNOSIS — M810 Age-related osteoporosis without current pathological fracture: Secondary | ICD-10-CM | POA: Diagnosis not present

## 2017-10-23 DIAGNOSIS — Z794 Long term (current) use of insulin: Secondary | ICD-10-CM

## 2017-10-23 DIAGNOSIS — E213 Hyperparathyroidism, unspecified: Secondary | ICD-10-CM

## 2017-10-23 DIAGNOSIS — E1165 Type 2 diabetes mellitus with hyperglycemia: Secondary | ICD-10-CM | POA: Diagnosis not present

## 2017-10-23 DIAGNOSIS — I10 Essential (primary) hypertension: Secondary | ICD-10-CM | POA: Diagnosis not present

## 2017-10-23 DIAGNOSIS — Z780 Asymptomatic menopausal state: Secondary | ICD-10-CM

## 2017-10-23 MED ORDER — RALOXIFENE HCL 60 MG PO TABS: 60.0000 mg | ORAL_TABLET | Freq: Every day | ORAL | 3 refills | Status: DC

## 2017-10-23 NOTE — Progress Notes (Signed)
Patient ID: Kimberly [name], female   DOB: [birth year], 67 y.o.   MRN: [id number]           Reason for Appointment: Endocrinology follow-up  History of Present Illness:          Date of diagnosis of type 2 diabetes mellitus: 1995        Background history:   For several years she had been tried on oral agents with variable control.  Detailed history not available She thinks that she had nausea and vomiting with metformin and could not take much Also had been tried on Amaryl She thinks she had swelling with Januvia and not clear what other medications she has tried before  She was started on insulin in 07/2013 when her A1c was over 11% Because of her poor control and A1c of 9.1 she was started on Invokana 100 mg daily in 12/16  Recent history:   INSULIN regimen is: 38 Lantus hs   Novolog 18  units before lunch. Novolog 10 acs    Her A1c is slightly higher at 6.7, previously was unchanged at 6.5  Current blood sugar patterns, management and problems identified:  She did not bring her monitor for download  She thinks that over the last few weeks because of stress and depression she had been not watching her diet, eating more carbohydrates and sugars would be higher  She would take 2 more units of NovoLog when she would go off her diet he still has occasional readings over 200 after meals but not as often  She does not think her blood sugars are consistently high in the morning waking up  Also she has been somewhat irregular with going to the gym because of stress and now doing this only about twice a week  Very consistent with her insulin regimen  No side effects with Invokana   Non-insulin hypoglycemic drugs the patient is taking are:  Invokana 100 mg daily      Side effects from medications have been: Nausea from metformin,?  Swelling from Januvia  Compliance with the medical regimen: Variable now  Glucose monitoring:  done up to 3 times a day         Glucometer:  Contour        Blood Glucose readings by recall   Am  120-140, PC highest 206  Previous readings:  Mean values apply above for all meters except median for One Touch  PRE-MEAL Fasting Lunch Dinner Bedtime Overall  Glucose range: 86-149     67-161   Mean/median: 103     108   POST-MEAL PC Breakfast PC Lunch PC Dinner  Glucose range:  67-1 61  78-156   Mean/median:  114  113      Self-care: The diet that the patient has been following is: tries to limit fats, breads.     Meal times are:  Lunch: 12 pm Dinner: 4-5 PM   Typical meal intake: Breakfast is oatmeal or grits             Dietician visit, most recent: 09/2015               Exercise:  walking at gym 2/7 days a week   Weight history:  Wt Readings from Last 3 Encounters:  10/23/17 158 lb (71.7 kg)  08/06/17 154 lb 12.8 oz (70.2 kg)  06/25/17 158 lb (71.7 kg)    Glycemic control:   Lab Results  Component Value Date   HGBA1C 6.7 (  H) 10/20/2017   HGBA1C 6.5 06/22/2017   HGBA1C 6.5 02/19/2017   Lab Results  Component Value Date   MICROALBUR 1.4 02/19/2017   LDLCALC 70 06/22/2017   CREATININE 0.73 10/20/2017       Allergies as of 10/23/2017      Reactions   Metformin And Related Nausea And Vomiting   After one pill at hospital   Januvia [sitagliptin Phosphate] Other (See Comments)   Swelling and edema see 4 13 OV      Medication List        Accurate as of 10/23/17  8:14 AM. Always use your most recent med list.          albuterol 108 (90 Base) MCG/ACT inhaler Commonly known as:  PROVENTIL HFA;VENTOLIN HFA Inhale 2 puffs into the lungs every 6 (six) hours as needed. For shortness of breath or wheezing   aspirin 325 MG EC tablet Take 325 mg by mouth every morning.   atorvastatin 80 MG tablet Commonly known as:  LIPITOR TAKE 1 TABLET BY MOUTH EVERY DAY   glucose blood test strip Commonly known as:  CONTOUR NEXT TEST TEST AS DIRECTED UP TO FOUR TIMES DAILY Dx: E11.9   Insulin Glargine 100 UNIT/ML Solostar  Pen Commonly known as:  LANTUS SOLOSTAR 38 units qd   Insulin Pen Needle 32G X 4 MM Misc Commonly known as:  BD PEN NEEDLE NANO U/F USE WITH LEVEMIR FLEX PEN   INVOKANA 100 MG Tabs tablet Generic drug:  canagliflozin TAKE 1 TABLET BY MOUTH BEFORE BREAKFAST   lisinopril-hydrochlorothiazide 20-12.5 MG tablet Commonly known as:  PRINZIDE,ZESTORETIC Take 0.5 tablets by mouth daily. Increase to 1 po qd as directed   NOVOLOG FLEXPEN 100 UNIT/ML FlexPen Generic drug:  insulin aspart INJECT 20 UNITS INTO THE SKIN FOR 1 DOSE WITH LARGEST MEAL, THEN USE AS DIRECTED       Allergies:  Allergies  Allergen Reactions  . Metformin And Related Nausea And Vomiting    After one pill at hospital  . Januvia [Sitagliptin Phosphate] Other (See Comments)    Swelling and edema see 4 13 OV    Past Medical History:  Diagnosis Date  . CAD (coronary artery disease)   . Colitis   . Colitis, indeterminate 01/31/2012  . COPD (chronic obstructive pulmonary disease) ([place])   . Depression 05/16/2017  . Depression with anxiety 05/30/2016  . Diverticulitis 02/10/2012  . DM (diabetes mellitus) ([place])   . Glaucoma   . Glaucoma   . History of ventricular fibrillation   . HTN (hypertension)   . Myocardial infarction ([place])    12/1998, 01/2011  . Osteoporosis   . PVD (peripheral vascular disease) ([place])     Past Surgical History:  Procedure Laterality Date  . ABDOMINAL HYSTERECTOMY     endometriosis  . CABG x 3  01/22/2011   Dr Cyndia Bent  . CARDIAC CATHETERIZATION  01/17/2011   Dr Arvella Merles  . LIPOMA EXCISION Left 02/08/2015   Procedure: EXCISION OF LEFT THIGH LIPOMA;  Surgeon: Greer Pickerel, MD;  Location: [facility];  Service: General;  Laterality: Left;  . S/P aortobifemoral bypass  2008   Dr Donnetta Hutching  . S/P myocardial infarction and PCI of the lt circumflex  2000    Family History  Problem Relation Age of Onset  . Heart attack Father   . Diabetes Father   . Heart disease Father   . Diabetes  Brother   . Diabetes Mother   . Liver cancer Mother   .  Hypertension Unknown   . Thyroid disease Unknown        sisters- sister had parathyroidectomy  . Alcohol abuse Unknown        brother  . Colon polyps Maternal Uncle   . Breast cancer Sister   . Diabetes Sister     Social History:  reports that she has been smoking cigarettes.  She has been smoking about 0.40 packs per day. She has never used smokeless tobacco. She reports that she does not drink alcohol or use drugs.    Review of Systems    Lipid history: On 80 mg Lipitor because of history of CAD     Lab Results  Component Value Date   CHOL 127 06/22/2017   HDL 26.40 (L) 06/22/2017   LDLCALC 70 06/22/2017   LDLDIRECT 87.0 04/24/2015   TRIG 150.0 (H) 06/22/2017   CHOLHDL 5 06/22/2017           Hypertension: Present for several years Zestoretic  was reduced to half a tablet with starting Invokana and blood pressure is stable, no lightheadedness    BP Readings from Last 3 Encounters:  10/23/17 112/64  08/06/17 138/66  06/25/17 120/70    HYPERCALCEMIA: She has had periodic mild hypercalcemia since about 2010; PTH levels in the mid-upper range Calcium is slightly higher at 10.6  She is taking 1000 units of vitamin D, Her last vitamin D level was   No history of fractures  Her last bone density was in 2/19 and this now shows mild osteopenia at the hip, her spine also shows a decline  in 07/2015 T score -2.3 was at the wrist, this was not reassessed and she is concerned about her family history of osteoporosis  Most recent eye exam was normal in 9/18  Most recent foot exam: 07/2017 by PCP  Review of Systems  Smoking history: She is smoking half a pack a day and thinks she is trying to cut back, recently less stressful because of stress  LABS:  Lab on 10/20/2017  Component Date Value Ref Range Status  . VITD 10/20/2017 53.52  30.00 - 100.00 ng/mL Final  . Sodium 10/20/2017 138  135 - 145 mEq/L Final  .  Potassium 10/20/2017 5.1  3.5 - 5.1 mEq/L Final  . Chloride 10/20/2017 102  96 - 112 mEq/L Final  . CO2 10/20/2017 28  19 - 32 mEq/L Final  . Glucose, Bld 10/20/2017 129* 70 - 99 mg/dL Final  . BUN 10/20/2017 19  6 - 23 mg/dL Final  . Creatinine, Ser 10/20/2017 0.73  0.40 - 1.20 mg/dL Final  . Total Bilirubin 10/20/2017 0.5  0.2 - 1.2 mg/dL Final  . Alkaline Phosphatase 10/20/2017 84  39 - 117 U/L Final  . AST 10/20/2017 18  0 - 37 U/L Final  . ALT 10/20/2017 18  0 - 35 U/L Final  . Total Protein 10/20/2017 7.4  6.0 - 8.3 g/dL Final  . Albumin 10/20/2017 4.1  3.5 - 5.2 g/dL Final  . Calcium 10/20/2017 10.6* 8.4 - 10.5 mg/dL Final  . GFR 10/20/2017 84.82  >60.00 mL/min Final  . Hgb A1c MFr Bld 10/20/2017 6.7* 4.6 - 6.5 % Final   Glycemic Control Guidelines for People with Diabetes:Non Diabetic:  <6%Goal of Therapy: <7%Additional Action Suggested:  >8%     Physical Examination:  BP 112/64 (BP Location: Left Arm, Patient Position: Sitting, Cuff Size: Normal)   Pulse 70   Ht 5' 3.25" (1.607 m)   Wt 158 lb (  71.7 kg)   SpO2 93%   BMI 27.77 kg/m   Standing blood pressure 114/70 No edema    ASSESSMENT:  Diabetes type 2, BMI 28  See history of present illness for detailed discussion of his current management, blood sugar patterns and problems identified  She has excellent control with A1c down to 6.5 again Her blood sugars are very consistent throughout the day She is very compliant with her insulin regimen, Invokana, blood sugar monitoring and diet She is trying to exercise although not as much recently  HYPERCALCEMIA: Calcium is mildly increased secondary to hyperparathyroidism and stable  OSTEOPENIA: Bone density is declining and discussed we need to intervene and prevent further loss She has multiple factors including postmenopausal status, hyperparathyroidism and smoking history  Discussed benefits of Evista which would be a simple long-term treatment and she agrees to  start Discussed rare side effects of DVT and hot flushes Has adequate vitamin D levels  HYPERTENSION:  well-controlled with current regimen Encouraged her to check some readings at home    PLAN:   She will bring her monitor for download on the next visit Checking some readings after meals No change in Lantus or NovoLog as yet Increase frequency of exercise Sugar targets at various times discussed She will call if blood sugars consistently high  Management of hypertension and osteopenia as above  Total visit time for evaluation and management of multiple problems and counseling =25 minutes  There are no Patient Instructions on file for this visit.  [name] 10/23/2017, 8:14 AM   Note: This office note was prepared with Dragon voice recognition system technology. Any transcriptional errors that result from this process are unintentional.

## 2017-11-04 ENCOUNTER — Other Ambulatory Visit: Payer: Self-pay | Admitting: Endocrinology

## 2017-11-06 ENCOUNTER — Encounter: Payer: BLUE CROSS/BLUE SHIELD | Admitting: Internal Medicine

## 2017-11-12 ENCOUNTER — Other Ambulatory Visit: Payer: Self-pay | Admitting: Endocrinology

## 2017-11-27 ENCOUNTER — Other Ambulatory Visit: Payer: Self-pay | Admitting: Endocrinology

## 2017-12-04 ENCOUNTER — Other Ambulatory Visit: Payer: Self-pay | Admitting: Endocrinology

## 2018-01-03 ENCOUNTER — Other Ambulatory Visit: Payer: Self-pay | Admitting: Endocrinology

## 2018-01-05 ENCOUNTER — Other Ambulatory Visit: Payer: Self-pay | Admitting: Endocrinology

## 2018-01-09 ENCOUNTER — Other Ambulatory Visit: Payer: Self-pay | Admitting: Endocrinology

## 2018-01-18 ENCOUNTER — Telehealth: Payer: Self-pay | Admitting: Endocrinology

## 2018-01-18 ENCOUNTER — Other Ambulatory Visit (INDEPENDENT_AMBULATORY_CARE_PROVIDER_SITE_OTHER): Payer: PPO

## 2018-01-18 ENCOUNTER — Other Ambulatory Visit: Payer: Self-pay

## 2018-01-18 DIAGNOSIS — E1165 Type 2 diabetes mellitus with hyperglycemia: Secondary | ICD-10-CM | POA: Diagnosis not present

## 2018-01-18 DIAGNOSIS — Z794 Long term (current) use of insulin: Secondary | ICD-10-CM

## 2018-01-18 LAB — COMPREHENSIVE METABOLIC PANEL
ALBUMIN: 4.2 g/dL (ref 3.5–5.2)
ALT: 21 U/L (ref 0–35)
AST: 17 U/L (ref 0–37)
Alkaline Phosphatase: 87 U/L (ref 39–117)
BUN: 19 mg/dL (ref 6–23)
CALCIUM: 10.2 mg/dL (ref 8.4–10.5)
CO2: 26 meq/L (ref 19–32)
CREATININE: 0.81 mg/dL (ref 0.40–1.20)
Chloride: 104 mEq/L (ref 96–112)
GFR: 75.17 mL/min (ref >60.00)
Glucose, Bld: 122 mg/dL — ABNORMAL HIGH (ref 70–99)
POTASSIUM: 4.7 meq/L (ref 3.5–5.1)
Sodium: 138 mEq/L (ref 135–145)
Total Bilirubin: 0.5 mg/dL (ref 0.2–1.2)
Total Protein: 7.3 g/dL (ref 6.0–8.3)

## 2018-01-18 LAB — MICROALBUMIN / CREATININE URINE RATIO
CREATININE, U: 93.1 mg/dL
Microalb Creat Ratio: 3.3 mg/g (ref 0.0–30.0)
Microalb, Ur: 3.1 mg/dL — ABNORMAL HIGH (ref 0.0–1.9)

## 2018-01-18 LAB — HEMOGLOBIN A1C: HEMOGLOBIN A1C: 6.7 % — AB (ref 4.6–6.5)

## 2018-01-18 MED ORDER — INSULIN PEN NEEDLE 32G X 4 MM MISC: 5 refills | Status: DC

## 2018-01-18 NOTE — Telephone Encounter (Signed)
error 

## 2018-01-21 ENCOUNTER — Encounter: Payer: Self-pay | Admitting: Endocrinology

## 2018-01-21 ENCOUNTER — Ambulatory Visit (INDEPENDENT_AMBULATORY_CARE_PROVIDER_SITE_OTHER): Payer: PPO | Admitting: Endocrinology

## 2018-01-21 VITALS — BP 130/70 | HR 80 | Ht 63.25 in | Wt 158.8 lb

## 2018-01-21 DIAGNOSIS — Z794 Long term (current) use of insulin: Secondary | ICD-10-CM

## 2018-01-21 DIAGNOSIS — E1165 Type 2 diabetes mellitus with hyperglycemia: Secondary | ICD-10-CM | POA: Diagnosis not present

## 2018-01-21 NOTE — Progress Notes (Signed)
Patient ID: Kimberly [name], female   DOB: Oct 29, 1950, 67 y.o.   MRN: [id number]           Reason for Appointment: Endocrinology follow-up  History of Present Illness:          Date of diagnosis of type 2 diabetes mellitus: 1995        Background history:   For several years she had been tried on oral agents with variable control.  Detailed history not available She thinks that she had nausea and vomiting with metformin and could not take much Also had been tried on Amaryl She thinks she had swelling with Januvia and not clear what other medications she has tried before  She was started on insulin in 07/2013 when her A1c was over 11% Because of her poor control and A1c of 9.1 she was started on Invokana 100 mg daily in 12/16  Recent history:   INSULIN regimen is: 38 Lantus hs   Novolog 18  units before lunch. Novolog 10 acs    Her A1c is again slightly higher at 6.7, previously was  6.5  Current blood sugar patterns, management and problems identified:  She did bring her monitor for download  She has done better with watching her diet since her last visit  Blood sugars appear to be relatively lower after lunch although she thinks her sugar was lower at 60 once because of being late for her meal  However compared to the last time her fasting readings are relatively higher  She is very compliant with checking sugars after meals and taking her insulin  Recently has not had the time to do any walking with family issues  Her weight is about the same   Non-insulin hypoglycemic drugs the patient is taking are:  Invokana 100 mg daily      Side effects from medications have been: Nausea from metformin,?  Swelling from Januvia  Compliance with the medical regimen: Improving now  Glucose monitoring:  done up to 3 times a day         Glucometer:  Contour       Blood Glucose readings by download  Mean values apply above for all meters except median for One Touch  PRE-MEAL Fasting  Lunch Dinner Bedtime Overall  Glucose range:       Mean/median:  126    116   POST-MEAL PC Breakfast PC Lunch PC Dinner  Glucose range:   60-146  94-144  Mean/median:   90  123   Previous readings:  PRE-MEAL Fasting Lunch Dinner Bedtime Overall  Glucose range: 86-149     67-161   Mean/median: 103     108   POST-MEAL PC Breakfast PC Lunch PC Dinner  Glucose range:  67-1 61  78-156   Mean/median:  114  113      Self-care: The diet that the patient has been following is: tries to limit fats, breads.     Meal times are:  Lunch: 12 pm Dinner: 4-5 PM   Typical meal intake: Breakfast is oatmeal or grits             Dietician visit, most recent: 09/2015               Exercise:  walking at gym 0-3/7 days a week   Weight history:  Wt Readings from Last 3 Encounters:  01/21/18 158 lb 12.8 oz (72 kg)  10/23/17 158 lb (71.7 kg)  08/06/17 154 lb 12.8 oz (70.2  kg)    Glycemic control:   Lab Results  Component Value Date   HGBA1C 6.7 (H) 01/18/2018   HGBA1C 6.7 (H) 10/20/2017   HGBA1C 6.5 06/22/2017   Lab Results  Component Value Date   MICROALBUR 3.1 (H) 01/18/2018   LDLCALC 70 06/22/2017   CREATININE 0.81 01/18/2018       Allergies as of 01/21/2018      Reactions   Metformin And Related Nausea And Vomiting   After one pill at hospital   Januvia [sitagliptin Phosphate] Other (See Comments)   Swelling and edema see 4 13 OV      Medication List        Accurate as of 01/21/18 11:59 PM. Always use your most recent med list.          albuterol 108 (90 Base) MCG/ACT inhaler Commonly known as:  PROVENTIL HFA;VENTOLIN HFA Inhale 2 puffs into the lungs every 6 (six) hours as needed. For shortness of breath or wheezing   aspirin 325 MG EC tablet Take 325 mg by mouth every morning.   atorvastatin 80 MG tablet Commonly known as:  LIPITOR TAKE 1 TABLET BY MOUTH EVERY DAY   glucose blood test strip TEST AS DIRECTED UP TO FOUR TIMES DAILY Dx: E11.9   Insulin Glargine  100 UNIT/ML Solostar Pen Commonly known as:  LANTUS INJECT 38 UNITS INTO THE SKIN EVERY DAY   Insulin Pen Needle 32G X 4 MM Misc USE WITH LEVEMIR FLEX PEN   INVOKANA 100 MG Tabs tablet Generic drug:  canagliflozin TAKE 1 TABLET BY MOUTH BEFORE BREAKFAST   lisinopril-hydrochlorothiazide 20-12.5 MG tablet Commonly known as:  PRINZIDE,ZESTORETIC Take 0.5 tablets by mouth daily. Increase to 1 po qd as directed   NOVOLOG FLEXPEN 100 UNIT/ML FlexPen Generic drug:  insulin aspart INJECT 20 UNITS INTO THE SKIN FOR 1 DOSE WITH LARGEST MEAL, THEN USE AS DIRECTED       Allergies:  Allergies  Allergen Reactions  . Metformin And Related Nausea And Vomiting    After one pill at hospital  . Januvia [Sitagliptin Phosphate] Other (See Comments)    Swelling and edema see 4 13 OV    Past Medical History:  Diagnosis Date  . CAD (coronary artery disease)   . Colitis   . Colitis, indeterminate 01/31/2012  . COPD (chronic obstructive pulmonary disease) ([place])   . Depression 05/16/2017  . Depression with anxiety 05/30/2016  . Diverticulitis 02/10/2012  . DM (diabetes mellitus) ([place])   . Glaucoma   . Glaucoma   . History of ventricular fibrillation   . HTN (hypertension)   . Myocardial infarction ([place])    12/1998, 01/2011  . Osteoporosis   . PVD (peripheral vascular disease) ([place])     Past Surgical History:  Procedure Laterality Date  . ABDOMINAL HYSTERECTOMY     endometriosis  . CABG x 3  01/22/2011   Dr Cyndia Bent  . CARDIAC CATHETERIZATION  01/17/2011   Dr Arvella Merles  . LIPOMA EXCISION Left 02/08/2015   Procedure: EXCISION OF LEFT THIGH LIPOMA;  Surgeon: Greer Pickerel, MD;  Location: [facility];  Service: General;  Laterality: Left;  . S/P aortobifemoral bypass  2008   Dr Donnetta Hutching  . S/P myocardial infarction and PCI of the lt circumflex  2000    Family History  Problem Relation Age of Onset  . Heart attack Father   . Diabetes Father   . Heart disease Father   . Diabetes  Brother   . Diabetes  Mother   . Liver cancer Mother   . Hypertension Unknown   . Thyroid disease Unknown        sisters- sister had parathyroidectomy  . Alcohol abuse Unknown        brother  . Colon polyps Maternal Uncle   . Breast cancer Sister   . Diabetes Sister     Social History:  reports that she has been smoking cigarettes. She has been smoking about 0.40 packs per day. She has never used smokeless tobacco. She reports that she does not drink alcohol or use drugs.    Review of Systems    Lipid history: On 80 mg Lipitor because of history of CAD     Lab Results  Component Value Date   CHOL 127 06/22/2017   HDL 26.40 (L) 06/22/2017   LDLCALC 70 06/22/2017   LDLDIRECT 87.0 04/24/2015   TRIG 150.0 (H) 06/22/2017   CHOLHDL 5 06/22/2017           Hypertension: Present for several years Zestoretic  was reduced to half a tablet with starting Invokana and blood pressure is stable, no lightheadedness    BP Readings from Last 3 Encounters:  01/21/18 130/70  10/23/17 112/64  08/06/17 138/66    HYPERCALCEMIA: She has had periodic mild hypercalcemia since about 2010; PTH levels in the mid-upper range Calcium is slightly better at 10.2   She is taking 1000 units of vitamin D with adequate levels  No history of fractures  Her last bone density was in 2/19 and this showed worsening mild osteopenia at the hip, her spine also shows a decline  She was tried on Evista but she says it was causing her hot flashes and night sweats and she stopped this last month  in 07/2015 T score -2.3 was at the wrist, this was not reassessed in 2019 and she is concerned about her family history of osteoporosis  Most recent eye exam was normal in 9/18  Most recent foot exam: 07/2017 by PCP  Review of Systems  Smoking history: She is smoking half a pack a day and not ready to quit  LABS:  Appointment on 01/18/2018  Component Date Value Ref Range Status  . Microalb, Ur 01/18/2018  3.1* 0.0 - 1.9 mg/dL Final  . Creatinine,U 01/18/2018 93.1  mg/dL Final  . Microalb Creat Ratio 01/18/2018 3.3  0.0 - 30.0 mg/g Final  . Sodium 01/18/2018 138  135 - 145 mEq/L Final  . Potassium 01/18/2018 4.7  3.5 - 5.1 mEq/L Final  . Chloride 01/18/2018 104  96 - 112 mEq/L Final  . CO2 01/18/2018 26  19 - 32 mEq/L Final  . Glucose, Bld 01/18/2018 122* 70 - 99 mg/dL Final  . BUN 01/18/2018 19  6 - 23 mg/dL Final  . Creatinine, Ser 01/18/2018 0.81  0.40 - 1.20 mg/dL Final  . Total Bilirubin 01/18/2018 0.5  0.2 - 1.2 mg/dL Final  . Alkaline Phosphatase 01/18/2018 87  39 - 117 U/L Final  . AST 01/18/2018 17  0 - 37 U/L Final  . ALT 01/18/2018 21  0 - 35 U/L Final  . Total Protein 01/18/2018 7.3  6.0 - 8.3 g/dL Final  . Albumin 01/18/2018 4.2  3.5 - 5.2 g/dL Final  . Calcium 01/18/2018 10.2  8.4 - 10.5 mg/dL Final  . GFR 01/18/2018 75.17  >60.00 mL/min Final  . Hgb A1c MFr Bld 01/18/2018 6.7* 4.6 - 6.5 % Final   Glycemic Control Guidelines for People with Diabetes:Non  Diabetic:  <6%Goal of Therapy: <7%Additional Action Suggested:  >8%     Physical Examination:  BP 130/70 (BP Location: Left Arm, Patient Position: Sitting, Cuff Size: Normal)   Pulse 80   Ht 5' 3.25" (1.607 m)   Wt 158 lb 12.8 oz (72 kg)   SpO2 96%   BMI 27.91 kg/m    ASSESSMENT:  Diabetes type 2, BMI 28  See history of present illness for detailed discussion of his current management, blood sugar patterns and problems identified  She has excellent control with fairly good blood sugars at home although A1c higher than the past at 6.7  Her fasting readings may be relatively higher compared to before With a relatively higher dose of mealtime insulin at lunchtime her sugars are averaging below 100 after lunch Otherwise postprandial readings are good She usually doing well with her diet but recently not finding the time to exercise regularly   HYPERCALCEMIA: Calcium is periodically increased secondary to  hyperparathyroidism and stable  OSTEOPENIA: Bone density is in the osteopenia range Since she did not tolerate Evista at this time she is not a candidate for bisphosphonate with her fracture risk not being excessive Will however need to be followed with serial bone densities   HYPERTENSION:  well-controlled with current regimen Also taking at home No microalbuminuria   PLAN:   She will reduce her lunch dose to 16 NovoLog Lantus will be increased to 40 units for better fasting readings She can restart exercise Bone density next year  Patient Instructions  Reduce lunch dose to 16  try taking 40 of Lantus insulin to 38   Torrian Canion 01/22/2018, 8:29 AM   Note: This office note was prepared with Dragon voice recognition system technology. Any transcriptional errors that result from this process are unintentional.

## 2018-01-21 NOTE — Patient Instructions (Addendum)
Reduce lunch dose to 16  try taking 40 of Lantus insulin to 38

## 2018-02-03 NOTE — Progress Notes (Signed)
Chief Complaint  Patient presents with  . Follow-up    No new concerns    HPI: Kimberly [name] 67 y.o. come in for Chronic disease management  Seeing dr Dwyane Dee about dm and endocrine issues  Doing ok fighting evening mood depression but  Active and doing ok in day  No  Medication.  Sleeping fine  1 day per week .  Works caretaker Treadmill  2 miles  No sob cp Right hip.  Limits but rests and keeps going  Vision : eye check right eye  hld on meds  Bp no se of med no syncope of racing heart Tobacco  8 per day. ROS: See pertinent positives and negatives per HPI. No cough untowrad weight loss  Gi sx had  Colon screen   Past Medical History:  Diagnosis Date  . CAD (coronary artery disease)   . Colitis   . Colitis, indeterminate 01/31/2012  . COPD (chronic obstructive pulmonary disease) ([place])   . Depression 05/16/2017  . Depression with anxiety 05/30/2016  . Diverticulitis 02/10/2012  . DM (diabetes mellitus) ([place])   . Glaucoma   . Glaucoma   . History of ventricular fibrillation   . HTN (hypertension)   . Myocardial infarction ([place])    12/1998, 01/2011  . Osteoporosis   . PVD (peripheral vascular disease) ([facility])     Family History  Problem Relation Age of Onset  . Heart attack Father   . Diabetes Father   . Heart disease Father   . Diabetes Brother   . Diabetes Mother   . Liver cancer Mother   . Hypertension Unknown   . Thyroid disease Unknown        sisters- sister had parathyroidectomy  . Alcohol abuse Unknown        brother  . Colon polyps Maternal Uncle   . Breast cancer Sister   . Diabetes Sister     Social History   Socioeconomic History  . Marital status: Married    Spouse name: Not on file  . Number of children: 3  . Years of education: Not on file  . Highest education level: Not on file  Occupational History  . Occupation: housewife    Employer: UNEMPLOYED  Social Needs  . Financial resource strain: Not on file  . Food insecurity:    Worry: Not on  file    Inability: Not on file  . Transportation needs:    Medical: Not on file    Non-medical: Not on file  Tobacco Use  . Smoking status: Current Every Day Smoker    Packs/day: 0.40    Types: Cigarettes  . Smokeless tobacco: Never Used  Substance and Sexual Activity  . Alcohol use: No  . Drug use: No  . Sexual activity: Not on file  Lifestyle  . Physical activity:    Days per week: Not on file    Minutes per session: Not on file  . Stress: Not on file  Relationships  . Social connections:    Talks on phone: Not on file    Gets together: Not on file    Attends religious service: Not on file    Active member of club or organization: Not on file    Attends meetings of clubs or organizations: Not on file    Relationship status: Not on file  Other Topics Concern  . Not on file  Social History Narrative   t widowed    Regular exercise- yes not recently  Quit tobacco in August at heart surgery. Restarted    Husband diagnosed with liver cancer since 2014 passed April 2015    She was care taker.   No pets    Outpatient Medications Prior to Visit  Medication Sig Dispense Refill  . albuterol (PROVENTIL HFA;VENTOLIN HFA) 108 (90 Base) MCG/ACT inhaler Inhale 2 puffs into the lungs every 6 (six) hours as needed. For shortness of breath or wheezing 1 Inhaler 2  . aspirin 325 MG EC tablet Take 325 mg by mouth every morning.     Marland Kitchen atorvastatin (LIPITOR) 80 MG tablet TAKE 1 TABLET BY MOUTH EVERY DAY 30 tablet 5  . glucose blood (CONTOUR NEXT TEST) test strip TEST AS DIRECTED UP TO FOUR TIMES DAILY Dx: E11.9 100 each 3  . Insulin Glargine (LANTUS SOLOSTAR) 100 UNIT/ML Solostar Pen INJECT 38 UNITS INTO THE SKIN EVERY DAY 15 mL 0  . Insulin Pen Needle (BD PEN NEEDLE NANO U/F) 32G X 4 MM MISC USE WITH LEVEMIR FLEX PEN 100 each 5  . INVOKANA 100 MG TABS tablet TAKE 1 TABLET BY MOUTH BEFORE BREAKFAST 30 tablet 2  . lisinopril-hydrochlorothiazide (PRINZIDE,ZESTORETIC) 20-12.5 MG tablet  Take 0.5 tablets by mouth daily. Increase to 1 po qd as directed 90 tablet 3  . NOVOLOG FLEXPEN 100 UNIT/ML FlexPen INJECT 20 UNITS INTO THE SKIN FOR 1 DOSE WITH LARGEST MEAL, THEN USE AS DIRECTED (Patient taking differently: 18 UNITS AT THE LARGEST MEAL AND 10 UNITS AT DINNER.) 15 mL 0   No facility-administered medications prior to visit.      EXAM:  BP 112/62 (BP Location: Right Arm, Patient Position: Sitting, Cuff Size: Normal)   Pulse 87   Temp 98 F (36.7 C) (Oral)   Wt 157 lb 12.8 oz (71.6 kg)   BMI 27.73 kg/m   Body mass index is 27.73 kg/m.  GENERAL: vitals reviewed and listed above, alert, oriented, appears well hydrated and in no acute distress HEENT: atraumatic, conjunctiva  clear, no obvious abnormalities on inspection of external nose and ears  NECK: no obvious masses on inspection palpation  LUNGS: clear to auscultation bilaterally, no wheezes, rales or rhonchi, CV: HRRR,  2/6 ssem lusb not to neck dec when supine  no clubbing cyanosis or  peripheral edema nl cap refill  MS: moves all extremities without noticeable focal  abnormality PSYCH: pleasant and cooperative, no obvious depression or anxiety Lab Results  Component Value Date   WBC 6.0 09/24/2017   HGB 16.0 (H) 09/24/2017   HCT 47.0 (H) 09/24/2017   PLT 160.0 09/24/2017   GLUCOSE 122 (H) 01/18/2018   CHOL 127 06/22/2017   TRIG 150.0 (H) 06/22/2017   HDL 26.40 (L) 06/22/2017   LDLDIRECT 87.0 04/24/2015   LDLCALC 70 06/22/2017   ALT 21 01/18/2018   AST 17 01/18/2018   NA 138 01/18/2018   K 4.7 01/18/2018   CL 104 01/18/2018   CREATININE 0.81 01/18/2018   BUN 19 01/18/2018   CO2 26 01/18/2018   TSH 4.13 07/17/2015   INR 1.06 02/01/2012   HGBA1C 6.7 (H) 01/18/2018   MICROALBUR 3.1 (H) 01/18/2018   BP Readings from Last 3 Encounters:  02/04/18 112/62  01/21/18 130/70  10/23/17 112/64    ASSESSMENT AND PLAN:  Discussed the following assessment and plan:  Essential hypertension  Medication  management  TOBACCO USER  Elevated hemoglobin ([facility]) - felt secondary to tobacco. at this tim no hx sx of osa will follow consider jak testing   Atherosclerosis  of coronary artery bypass graft of native heart without angina pectoris - no sx   SYSTOLIC MURMUR  PERIPHERAL VASCULAR DISEASE  Aortic valve sclerosis  Hyperlipidemia associated with type 2 diabetes mellitus ([facility])  no alarm  Findings on exam or hx disc mood  And strategies   Has good family support . Pt  Declines lung cancer screening at this time Vaccines disc .  Total visit 72mins > 50% spent counseling and coordinating care as indicated in above note and in instructions to patient .  -Patient advised to return or notify health care team  if  new concerns arise.  Patient Instructions  shingrix vaccine .    Flu vaccine   Before end of October .   Continue good sleep and exercise .   CPX in about 6 months   Standley Brooking. Panosh M.D.

## 2018-02-04 ENCOUNTER — Ambulatory Visit (INDEPENDENT_AMBULATORY_CARE_PROVIDER_SITE_OTHER): Payer: PPO | Admitting: Internal Medicine

## 2018-02-04 ENCOUNTER — Encounter: Payer: Self-pay | Admitting: Internal Medicine

## 2018-02-04 VITALS — BP 112/62 | HR 87 | Temp 98.0°F | Wt 157.8 lb

## 2018-02-04 DIAGNOSIS — E1169 Type 2 diabetes mellitus with other specified complication: Secondary | ICD-10-CM

## 2018-02-04 DIAGNOSIS — I10 Essential (primary) hypertension: Secondary | ICD-10-CM | POA: Diagnosis not present

## 2018-02-04 DIAGNOSIS — I358 Other nonrheumatic aortic valve disorders: Secondary | ICD-10-CM

## 2018-02-04 DIAGNOSIS — Z79899 Other long term (current) drug therapy: Secondary | ICD-10-CM

## 2018-02-04 DIAGNOSIS — F17200 Nicotine dependence, unspecified, uncomplicated: Secondary | ICD-10-CM | POA: Diagnosis not present

## 2018-02-04 DIAGNOSIS — R011 Cardiac murmur, unspecified: Secondary | ICD-10-CM

## 2018-02-04 DIAGNOSIS — E785 Hyperlipidemia, unspecified: Secondary | ICD-10-CM

## 2018-02-04 DIAGNOSIS — I25810 Atherosclerosis of coronary artery bypass graft(s) without angina pectoris: Secondary | ICD-10-CM

## 2018-02-04 DIAGNOSIS — I739 Peripheral vascular disease, unspecified: Secondary | ICD-10-CM | POA: Diagnosis not present

## 2018-02-04 DIAGNOSIS — D582 Other hemoglobinopathies: Secondary | ICD-10-CM

## 2018-02-04 NOTE — Patient Instructions (Signed)
shingrix vaccine .    Flu vaccine   Before end of October .   Continue good sleep and exercise .   CPX in about 6 months

## 2018-02-09 ENCOUNTER — Other Ambulatory Visit: Payer: Self-pay | Admitting: Endocrinology

## 2018-03-01 DIAGNOSIS — H43393 Other vitreous opacities, bilateral: Secondary | ICD-10-CM | POA: Diagnosis not present

## 2018-03-01 DIAGNOSIS — E119 Type 2 diabetes mellitus without complications: Secondary | ICD-10-CM | POA: Diagnosis not present

## 2018-03-01 DIAGNOSIS — H2513 Age-related nuclear cataract, bilateral: Secondary | ICD-10-CM | POA: Diagnosis not present

## 2018-03-01 LAB — HM DIABETES EYE EXAM

## 2018-03-07 ENCOUNTER — Other Ambulatory Visit: Payer: Self-pay | Admitting: Endocrinology

## 2018-03-25 ENCOUNTER — Other Ambulatory Visit: Payer: Self-pay | Admitting: Endocrinology

## 2018-03-31 ENCOUNTER — Encounter: Payer: Self-pay | Admitting: Internal Medicine

## 2018-04-02 ENCOUNTER — Other Ambulatory Visit: Payer: Self-pay | Admitting: Endocrinology

## 2018-04-16 ENCOUNTER — Other Ambulatory Visit: Payer: Self-pay | Admitting: Internal Medicine

## 2018-05-01 ENCOUNTER — Other Ambulatory Visit: Payer: Self-pay | Admitting: Endocrinology

## 2018-05-02 ENCOUNTER — Other Ambulatory Visit: Payer: Self-pay | Admitting: Endocrinology

## 2018-05-11 ENCOUNTER — Other Ambulatory Visit: Payer: Self-pay | Admitting: Endocrinology

## 2018-05-20 ENCOUNTER — Other Ambulatory Visit (INDEPENDENT_AMBULATORY_CARE_PROVIDER_SITE_OTHER): Payer: PPO

## 2018-05-20 DIAGNOSIS — E1165 Type 2 diabetes mellitus with hyperglycemia: Secondary | ICD-10-CM | POA: Diagnosis not present

## 2018-05-20 DIAGNOSIS — Z794 Long term (current) use of insulin: Secondary | ICD-10-CM

## 2018-05-20 LAB — LIPID PANEL
CHOL/HDL RATIO: 5
Cholesterol: 147 mg/dL (ref 0–200)
HDL: 28.7 mg/dL — AB (ref >39.00)
LDL CALC: 87 mg/dL (ref 0–99)
NONHDL: 118.66
TRIGLYCERIDES: 156 mg/dL — AB (ref 0.0–149.0)
VLDL: 31.2 mg/dL (ref 0.0–40.0)

## 2018-05-20 LAB — COMPREHENSIVE METABOLIC PANEL
ALT: 17 U/L (ref 0–35)
AST: 16 U/L (ref 0–37)
Albumin: 4.3 g/dL (ref 3.5–5.2)
Alkaline Phosphatase: 95 U/L (ref 39–117)
BILIRUBIN TOTAL: 0.6 mg/dL (ref 0.2–1.2)
BUN: 17 mg/dL (ref 6–23)
CALCIUM: 10.1 mg/dL (ref 8.4–10.5)
CHLORIDE: 104 meq/L (ref 96–112)
CO2: 28 meq/L (ref 19–32)
Creatinine, Ser: 0.78 mg/dL (ref 0.40–1.20)
GFR: 78.44 mL/min (ref >60.00)
GLUCOSE: 133 mg/dL — AB (ref 70–99)
POTASSIUM: 4.4 meq/L (ref 3.5–5.1)
Sodium: 139 mEq/L (ref 135–145)
Total Protein: 7.4 g/dL (ref 6.0–8.3)

## 2018-05-20 LAB — HEMOGLOBIN A1C: Hgb A1c MFr Bld: 6.7 % — ABNORMAL HIGH (ref 4.6–6.5)

## 2018-05-23 NOTE — Progress Notes (Signed)
Patient ID: Kimberly [name], female   DOB: February 08, 1951, 67 y.o.   MRN: [id number]           Reason for Appointment: Endocrinology follow-up  History of Present Illness:          Date of diagnosis of type 2 diabetes mellitus: 1995        Background history:   For several years she had been tried on oral agents with variable control.  Detailed history not available She thinks that she had nausea and vomiting with metformin and could not take much Also had been tried on Amaryl She thinks she had swelling with Januvia and not clear what other medications she has tried before  She was started on insulin in 07/2013 when her A1c was over 11% Because of her poor control and A1c of 9.1 she was started on Invokana 100 mg daily in 12/16  Recent history:   INSULIN regimen is: 38 Lantus hs   Novolog 18  units before lunch. Novolog 10 acs    Her A1c is again the same at 6.7  Current blood sugar patterns, management and problems identified:  She did not bring her monitor for download since she said that it is not working  Although she was told to reduce her NovoLog at lunchtime she forgot to do so and occasionally feels hypoglycemic in the afternoon and tries to eat an extra snack  With her fasting reading being mostly high he was also told to increase her Lantus but she forgot to do so  Again blood sugars may be higher at times in the morning but not consistent  However for the last month she has gone back to the gym doing exercise 3 days a week  Most likely is not doing readings after meals she does not remember what they are  Her weight is about the same   Non-insulin hypoglycemic drugs the patient is taking are:  Invokana 100 mg daily      Side effects from medications have been: Nausea from metformin,?  Swelling from Januvia  Compliance with the medical regimen: Improving now  Glucose monitoring:  done 1-up to 3 times a day         Glucometer:  Contour       Blood Glucose readings by   Recall  PRE-MEAL Fasting Lunch Dinner Bedtime Overall  Glucose range:   92- 140    Mean/median: 90-154   ?    POST-MEAL PC Breakfast PC Lunch PC Dinner  Glucose range:     Mean/median:      Previous readings  Mean values apply above for all meters except median for One Touch  PRE-MEAL Fasting Lunch Dinner Bedtime Overall  Glucose range:       Mean/median:  126    116   POST-MEAL PC Breakfast PC Lunch PC Dinner  Glucose range:   60-146  94-144  Mean/median:   90  123      Self-care: The diet that the patient has been following is: tries to limit fats, breads.     Meal times are:  Lunch: 12 pm Dinner: 5-7 PM   Typical meal intake: Breakfast is oatmeal or grits             Dietician visit, most recent: 09/2015               Exercise:  walking at gym 3/7 days a week   Weight history:  Wt Readings from Last 3 Encounters:  05/24/18 157 lb 6.4 oz (71.4 kg)  02/04/18 157 lb 12.8 oz (71.6 kg)  01/21/18 158 lb 12.8 oz (72 kg)    Glycemic control:   Lab Results  Component Value Date   HGBA1C 6.7 (H) 05/20/2018   HGBA1C 6.7 (H) 01/18/2018   HGBA1C 6.7 (H) 10/20/2017   Lab Results  Component Value Date   MICROALBUR 3.1 (H) 01/18/2018   LDLCALC 87 05/20/2018   CREATININE 0.78 05/20/2018       Allergies as of 05/24/2018      Reactions   Metformin And Related Nausea And Vomiting   After one pill at hospital   Januvia [sitagliptin Phosphate] Other (See Comments)   Swelling and edema see 4 13 OV      Medication List        Accurate as of 05/24/18  8:00 AM. Always use your most recent med list.          albuterol 108 (90 Base) MCG/ACT inhaler Commonly known as:  PROVENTIL HFA;VENTOLIN HFA Inhale 2 puffs into the lungs every 6 (six) hours as needed. For shortness of breath or wheezing   aspirin 325 MG EC tablet Take 325 mg by mouth every morning.   atorvastatin 80 MG tablet Commonly known as:  LIPITOR TAKE 1 TABLET BY MOUTH EVERY DAY   glucose blood test  strip TEST AS DIRECTED UP TO FOUR TIMES DAILY Dx: E11.9   Insulin Glargine 100 UNIT/ML Solostar Pen Commonly known as:  LANTUS INJECT 38 UNITS UNDER THE SKIN DAILY   Insulin Pen Needle 32G X 4 MM Misc USE WITH LEVEMIR FLEX PEN   INVOKANA 100 MG Tabs tablet Generic drug:  canagliflozin TAKE 1 TABLET BY MOUTH BEFORE BREAKFAST   lisinopril-hydrochlorothiazide 20-12.5 MG tablet Commonly known as:  PRINZIDE,ZESTORETIC Take 0.5 tablets by mouth daily. Increase to 1 po qd as directed   NOVOLOG FLEXPEN 100 UNIT/ML FlexPen Generic drug:  insulin aspart INJECT 18 UNITS UNDER THE SKIN AT LARGEST MEAL AND 10 UNITS AT DINNER       Allergies:  Allergies  Allergen Reactions  . Metformin And Related Nausea And Vomiting    After one pill at hospital  . Januvia [Sitagliptin Phosphate] Other (See Comments)    Swelling and edema see 4 13 OV    Past Medical History:  Diagnosis Date  . CAD (coronary artery disease)   . Colitis   . Colitis, indeterminate 01/31/2012  . COPD (chronic obstructive pulmonary disease) ([place])   . Depression 05/16/2017  . Depression with anxiety 05/30/2016  . Diverticulitis 02/10/2012  . DM (diabetes mellitus) ([place])   . Glaucoma   . Glaucoma   . History of ventricular fibrillation   . HTN (hypertension)   . Myocardial infarction ([place])    12/1998, 01/2011  . Osteoporosis   . PVD (peripheral vascular disease) ([place])     Past Surgical History:  Procedure Laterality Date  . ABDOMINAL HYSTERECTOMY     endometriosis  . CABG x 3  01/22/2011   Dr Cyndia Bent  . CARDIAC CATHETERIZATION  01/17/2011   Dr Arvella Merles  . LIPOMA EXCISION Left 02/08/2015   Procedure: EXCISION OF LEFT THIGH LIPOMA;  Surgeon: Greer Pickerel, MD;  Location: [facility];  Service: General;  Laterality: Left;  . S/P aortobifemoral bypass  2008   Dr Donnetta Hutching  . S/P myocardial infarction and PCI of the lt circumflex  2000    Family History  Problem Relation Age of Onset  . Heart attack Father     .  Diabetes Father   . Heart disease Father   . Diabetes Brother   . Diabetes Mother   . Liver cancer Mother   . Hypertension Unknown   . Thyroid disease Unknown        sisters- sister had parathyroidectomy  . Alcohol abuse Unknown        brother  . Colon polyps Maternal Uncle   . Breast cancer Sister   . Diabetes Sister     Social History:  reports that she has been smoking cigarettes. She has been smoking about 0.40 packs per day. She has never used smokeless tobacco. She reports that she does not drink alcohol or use drugs.    Review of Systems    Lipid history: On 80 mg Lipitor with history of CAD Prescribed by PCP Her LDL is still not below 70  Lab Results  Component Value Date   CHOL 147 05/20/2018   HDL 28.70 (L) 05/20/2018   LDLCALC 87 05/20/2018   LDLDIRECT 87.0 04/24/2015   TRIG 156.0 (H) 05/20/2018   CHOLHDL 5 05/20/2018           Hypertension: Present for several years Zestoretic  was reduced to half a tablet with starting Invokana previously and blood pressure is stable   BP Readings from Last 3 Encounters:  05/24/18 128/70  02/04/18 112/62  01/21/18 130/70    HYPERCALCEMIA: She has had periodic mild hypercalcemia since about 2010; PTH levels in the mid-upper range Calcium is still upper normal   She is taking 1000 units of vitamin D with adequate levels  No history of fractures  Her last bone density was in 2/19 and this showed worsening mild osteopenia at the hip, her spine also shows a decline  She was tried on Evista but she says it was causing her hot flashes and night sweats and she stopped taking this  Previously in 07/2015 T score -2.3 was at the wrist, this was not reassessed in 2019 and she is concerned about her family history of osteoporosis  Most recent eye exam was normal in 9/18  Most recent foot exam: 07/2017 by PCP  Review of Systems  Smoking history: She is smoking half a pack a day   LABS:  Lab on 05/20/2018   Component Date Value Ref Range Status  . Cholesterol 05/20/2018 147  0 - 200 mg/dL Final   ATP III Classification       Desirable:  < 200 mg/dL               Borderline High:  200 - 239 mg/dL          High:  > = 240 mg/dL  . Triglycerides 05/20/2018 156.0* 0.0 - 149.0 mg/dL Final   Normal:  <150 mg/dLBorderline High:  150 - 199 mg/dL  . HDL 05/20/2018 28.70* >39.00 mg/dL Final  . VLDL 05/20/2018 31.2  0.0 - 40.0 mg/dL Final  . LDL Cholesterol 05/20/2018 87  0 - 99 mg/dL Final  . Total CHOL/HDL Ratio 05/20/2018 5   Final                  Men          Women1/2 Average Risk     3.4          3.3Average Risk          5.0          4.42X Average Risk          9.6  7.13X Average Risk          15.0          11.0                      . NonHDL 05/20/2018 118.66   Final   NOTE:  Non-HDL goal should be 30 mg/dL higher than patient's LDL goal (i.e. LDL goal of < 70 mg/dL, would have non-HDL goal of < 100 mg/dL)  . Sodium 05/20/2018 139  135 - 145 mEq/L Final  . Potassium 05/20/2018 4.4  3.5 - 5.1 mEq/L Final  . Chloride 05/20/2018 104  96 - 112 mEq/L Final  . CO2 05/20/2018 28  19 - 32 mEq/L Final  . Glucose, Bld 05/20/2018 133* 70 - 99 mg/dL Final  . BUN 05/20/2018 17  6 - 23 mg/dL Final  . Creatinine, Ser 05/20/2018 0.78  0.40 - 1.20 mg/dL Final  . Total Bilirubin 05/20/2018 0.6  0.2 - 1.2 mg/dL Final  . Alkaline Phosphatase 05/20/2018 95  39 - 117 U/L Final  . AST 05/20/2018 16  0 - 37 U/L Final  . ALT 05/20/2018 17  0 - 35 U/L Final  . Total Protein 05/20/2018 7.4  6.0 - 8.3 g/dL Final  . Albumin 05/20/2018 4.3  3.5 - 5.2 g/dL Final  . Calcium 05/20/2018 10.1  8.4 - 10.5 mg/dL Final  . GFR 05/20/2018 78.44  >60.00 mL/min Final  . Hgb A1c MFr Bld 05/20/2018 6.7* 4.6 - 6.5 % Final   Glycemic Control Guidelines for People with Diabetes:Non Diabetic:  <6%Goal of Therapy: <7%Additional Action Suggested:  >8%     Physical Examination:  BP 128/70 (BP Location: Left Arm, Patient Position:  Sitting, Cuff Size: Normal)   Pulse 84   Ht 5' 3.25" (1.607 m)   Wt 157 lb 6.4 oz (71.4 kg)   SpO2 94%   BMI 27.66 kg/m    ASSESSMENT:  Diabetes type 2, BMI 28  See history of present illness for detailed discussion of his current management, blood sugar patterns and problems identified  A1c is the same at 6.7  She is on a basal bolus insulin regimen along with Invokana As discussed above could not review her monitor, did not bring it Blood sugars appear to be good by history and she is starting to exercise again   HYPERCALCEMIA: Calcium is stable  LIPIDS: Will defer management to PCP, needs LDL below 70  HYPERTENSION:  well-controlled with current regimen Renal function is normal     PLAN:   She will reduce her lunch NovoLog dose to 15 units If her morning sugars continue to stay high she can increase Lantus to 40 Follow-up with PCP regarding lipids  There are no Patient Instructions on file for this visit.  [name] 05/24/2018, 8:00 AM   Note: This office note was prepared with Dragon voice recognition system technology. Any transcriptional errors that result from this process are unintentional.

## 2018-05-24 ENCOUNTER — Encounter: Payer: Self-pay | Admitting: Endocrinology

## 2018-05-24 ENCOUNTER — Ambulatory Visit: Payer: PPO | Admitting: Endocrinology

## 2018-05-24 VITALS — BP 128/70 | HR 84 | Ht 63.25 in | Wt 157.4 lb

## 2018-05-24 DIAGNOSIS — E1165 Type 2 diabetes mellitus with hyperglycemia: Secondary | ICD-10-CM

## 2018-05-24 DIAGNOSIS — Z794 Long term (current) use of insulin: Secondary | ICD-10-CM | POA: Diagnosis not present

## 2018-05-24 DIAGNOSIS — I10 Essential (primary) hypertension: Secondary | ICD-10-CM

## 2018-05-24 NOTE — Patient Instructions (Addendum)
Novolog 15 at lunch   Check blood sugars on waking up days a week  Also check blood sugars about 2 hours after meals and do this after different meals by rotation  Recommended blood sugar levels on waking up are 90-130 and about 2 hours after meal is 130-160  Please bring your blood sugar monitor to each visit, thank you

## 2018-05-31 ENCOUNTER — Other Ambulatory Visit: Payer: Self-pay | Admitting: Endocrinology

## 2018-06-17 ENCOUNTER — Other Ambulatory Visit: Payer: Self-pay

## 2018-06-17 MED ORDER — INSULIN GLARGINE 100 UNIT/ML SOLOSTAR PEN: PEN_INJECTOR | SUBCUTANEOUS | 0 refills | Status: DC

## 2018-06-21 ENCOUNTER — Other Ambulatory Visit: Payer: Self-pay | Admitting: Internal Medicine

## 2018-06-21 MED ORDER — EZETIMIBE 10 MG PO TABS: 10.0000 mg | ORAL_TABLET | Freq: Every day | ORAL | 6 refills | Status: DC

## 2018-06-22 ENCOUNTER — Other Ambulatory Visit: Payer: Self-pay

## 2018-06-22 MED ORDER — INSULIN GLARGINE 100 UNIT/ML SOLOSTAR PEN: PEN_INJECTOR | SUBCUTANEOUS | 0 refills | Status: DC

## 2018-06-22 MED ORDER — GLUCOSE BLOOD VI STRP: ORAL_STRIP | 3 refills | Status: DC

## 2018-06-23 ENCOUNTER — Other Ambulatory Visit: Payer: Self-pay

## 2018-06-23 MED ORDER — ONETOUCH VERIO W/DEVICE KIT: 1.0000 | PACK | Freq: Every day | 0 refills | Status: DC

## 2018-06-23 MED ORDER — GLUCOSE BLOOD VI STRP: 1.0000 | ORAL_STRIP | 3 refills | Status: DC | PRN

## 2018-07-05 ENCOUNTER — Other Ambulatory Visit: Payer: Self-pay

## 2018-07-05 MED ORDER — CANAGLIFLOZIN 100 MG PO TABS: ORAL_TABLET | ORAL | 3 refills | Status: DC

## 2018-07-12 ENCOUNTER — Other Ambulatory Visit: Payer: Self-pay | Admitting: Internal Medicine

## 2018-07-20 ENCOUNTER — Telehealth: Payer: Self-pay | Admitting: Endocrinology

## 2018-07-20 ENCOUNTER — Other Ambulatory Visit: Payer: Self-pay

## 2018-07-20 MED ORDER — INSULIN ASPART 100 UNIT/ML FLEXPEN: PEN_INJECTOR | SUBCUTANEOUS | 0 refills | Status: DC

## 2018-07-20 NOTE — Telephone Encounter (Signed)
MEDICATION: NOVOLOG FLEXPEN 100 UNIT/ML FlexPen  PHARMACY:  [id number]  IS THIS A 90 DAY SUPPLY :   IS PATIENT OUT OF MEDICATION:   IF NOT; HOW MUCH IS LEFT: One tube  LAST APPOINTMENT DATE: @1 /20/2020  NEXT APPOINTMENT DATE:@4 /11/2018  DO WE HAVE YOUR PERMISSION TO LEAVE A DETAILED MESSAGE:  OTHER COMMENTS:    **Let patient know to contact pharmacy at the end of the day to make sure medication is ready. **  ** Please notify patient to allow 48-72 hours to process**  **Encourage patient to contact the pharmacy for refills or they can request refills through [facility]**

## 2018-07-20 NOTE — Telephone Encounter (Signed)
Rx sent 

## 2018-08-04 ENCOUNTER — Other Ambulatory Visit: Payer: Self-pay | Admitting: Internal Medicine

## 2018-08-06 NOTE — Progress Notes (Signed)
Chief Complaint  Patient presents with  . Annual Exam    pt has no concerns today and is doing well    HPI: Kimberly [name] 68 y.o. comes in today for Preventive Medicare exam/ wellness visit .  DM per dr Dwyane Dee   bg thi sam was 88 so ate her corn flakes  Denies neuropathy eye sx   bp ok no se med  Tobacco copd denies   Sob  But not that active   Mood doing well .  Lipids began zetia   1 month ago    In addition to the atorva.   Bone health per dr Dwyane Dee   pvd ascvd  No cp sob  claudication   Health Maintenance  Topic Date Due  . COLONOSCOPY  04/24/2015  . PNA vac Low Risk Adult (2 of 2 - PPSV23) 02/03/2018  . FOOT EXAM  08/06/2018  . HEMOGLOBIN A1C  11/19/2018  . TETANUS/TDAP  01/31/2019  . OPHTHALMOLOGY EXAM  03/02/2019  . INFLUENZA VACCINE  Completed  . DEXA SCAN  Completed  . Hepatitis C Screening  Completed   Health Maintenance Review LIFESTYLE:  Exercise:   Not a whole lot.   righ hip a problem .  Tobacco/ETS:  Still there depends.  4- 12 .  Breathing fine  Alcohol:   no Sugar beverages: 1 can sugar free  .  Sleep:  About 10  Drug use: no HH: 1 no pets.   Working 1 day per week 4  Hours      Hearing: good   Vision:  No limitations at present . Last eye check UTD 1 cataract.   Safety:  Has smoke detector and wears seat belts.   Firearms.safely stored  No excess sun exposure. Dentition fals teeth dentist regularly.  Falls: no  Memory: Felt to be good  , no concern from her or her family. Good   Depression: No anhedonia unusual crying or depressive symptoms ok  Nutrition: Eats well balanced diet; adequate calcium and vitamin D. No swallowing chewing problems.  Injury: no major injuries in the last six months.  Other healthcare providers:  Reviewed today .  Preventive parameters: so far declining colon  ( copst last time)   Reviewed   ADLS:   There are no problems or need for assistance  driving, feeding, obtaining food, dressing, toileting and  bathing, managing money using phone. She is independent.    ROS:  GEN/ HEENT: No fever, significant weight changes sweats headaches vision problems hearing changes, CV/ PULM; No chest pain shortness of breath cough, syncope,edema  change in exercise tolerance. GI /GU: No adominal pain, vomiting, change in bowel habits. No blood in the stool. No significant GU symptoms. SKIN/HEME: ,no acute skin rashes suspicious lesions or bleeding. No lymphadenopathy, nodules, masses.  NEURO/ PSYCH:  No neurologic signs such as weakness numbness. No depression anxiety. IMM/ Allergy: No unusual infections.  Allergy .   REST of 12 system review negative except as per HPI   Past Medical History:  Diagnosis Date  . CAD (coronary artery disease)   . Colitis   . Colitis, indeterminate 01/31/2012  . COPD (chronic obstructive pulmonary disease) ([place])   . Depression 05/16/2017  . Depression with anxiety 05/30/2016  . Diverticulitis 02/10/2012  . DM (diabetes mellitus) ([place])   . Glaucoma   . Glaucoma   . History of ventricular fibrillation   . HTN (hypertension)   . Myocardial infarction ([place])    12/1998, 01/2011  .  Osteoporosis   . PVD (peripheral vascular disease) ([facility])     Family History  Problem Relation Age of Onset  . Heart attack Father   . Diabetes Father   . Heart disease Father   . Diabetes Brother   . Diabetes Mother   . Liver cancer Mother   . Hypertension Unknown   . Thyroid disease Unknown        sisters- sister had parathyroidectomy  . Alcohol abuse Unknown        brother  . Colon polyps Maternal Uncle   . Breast cancer Sister   . Diabetes Sister     Social History   Socioeconomic History  . Marital status: Married    Spouse name: Not on file  . Number of children: 3  . Years of education: Not on file  . Highest education level: Not on file  Occupational History  . Occupation: housewife    Employer: UNEMPLOYED  Social Needs  . Financial resource strain: Not on file    . Food insecurity:    Worry: Not on file    Inability: Not on file  . Transportation needs:    Medical: Not on file    Non-medical: Not on file  Tobacco Use  . Smoking status: Current Every Day Smoker    Packs/day: 0.40    Types: Cigarettes  . Smokeless tobacco: Never Used  Substance and Sexual Activity  . Alcohol use: No  . Drug use: No  . Sexual activity: Not on file  Lifestyle  . Physical activity:    Days per week: Not on file    Minutes per session: Not on file  . Stress: Not on file  Relationships  . Social connections:    Talks on phone: Not on file    Gets together: Not on file    Attends religious service: Not on file    Active member of club or organization: Not on file    Attends meetings of clubs or organizations: Not on file    Relationship status: Not on file  Other Topics Concern  . Not on file  Social History Narrative   t widowed    Regular exercise- yes not recently       Quit tobacco in August at heart surgery. Restarted    Husband diagnosed with liver cancer since 2014 passed April 2015    She was care taker.   No pets    Outpatient Encounter Medications as of 08/09/2018  Medication Sig  . albuterol (PROVENTIL HFA;VENTOLIN HFA) 108 (90 Base) MCG/ACT inhaler Inhale 2 puffs into the lungs every 6 (six) hours as needed. For shortness of breath or wheezing  . aspirin 325 MG EC tablet Take 325 mg by mouth every morning.   Marland Kitchen atorvastatin (LIPITOR) 80 MG tablet TAKE 1 TABLET BY MOUTH EVERY DAY  . Blood Glucose Monitoring Suppl (ONETOUCH VERIO) w/Device KIT 1 each by Does not apply route daily. Use blood sugar meter to check blood sugar. DX:E11.9  . canagliflozin (INVOKANA) 100 MG TABS tablet TAKE 1 TABLET BY MOUTH BEFORE BREAKFAST  . ezetimibe (ZETIA) 10 MG tablet Take 1 tablet (10 mg total) by mouth daily.  Marland Kitchen glucose blood test strip 1 each by Other route as needed for other. Use as instructed to check blood sugar 4 times daily. DX:E11.9  . insulin  aspart (NOVOLOG FLEXPEN) 100 UNIT/ML FlexPen INJECT 18 UNITS UNDER THE SKIN AT LARGEST MEAL AND 10 UNITS AT DINNER  . Insulin Glargine (LANTUS  SOLOSTAR) 100 UNIT/ML Solostar Pen INJECT 38 UNITS UNDER THE SKIN DAILY  . Insulin Pen Needle (BD PEN NEEDLE NANO U/F) 32G X 4 MM MISC USE WITH LEVEMIR FLEX PEN  . lisinopril-hydrochlorothiazide (PRINZIDE,ZESTORETIC) 20-12.5 MG tablet Take 0.5 tablets by mouth daily. Increase to 1 po qd as directed   No facility-administered encounter medications on file as of 08/09/2018.     EXAM:  BP 134/70 (BP Location: Right Arm, Patient Position: Sitting, Cuff Size: Normal)   Pulse 86   Temp 97.9 F (36.6 C) (Oral)   Ht '5\' 3"'$  (1.6 m)   Wt 157 lb 3.2 oz (71.3 kg)   BMI 27.85 kg/m   Body mass index is 27.85 kg/m.  Physical Exam: Vital signs reviewed RCV:ELFY is a well-developed well-nourished alert cooperative   who appears stated age in no acute distress.  HEENT: normocephalic atraumatic , Eyes: PERRL EOM's full, conjunctiva clear, Nares: paten,t no deformity discharge or tenderness., Ears: no deformity EAC's clear TMs with normal landmarks. Mouth: clear OP, no lesions, edema.  Moist mucous membranes. Dentition artificial NECK: supple without masses, thyromegaly or bruits. CHEST/PULM:  Clear to auscultation and percussion breath sounds equal no wheeze , rales or rhonchi. No chest Mast deformities or tenderness.Breast: normal by inspection . No dimpling, discharge, masses, tenderness or discharge . CV: PMI is nondisplaced, S1 S2 no gallops,  Short sem lusb not radiating murmurs, rubs. Peripheral pulses are present .No JVD .  ABDOMEN: Bowel sounds normal nontender  No guard or rebound, no hepato splenomegal no CVA tenderness.   Extremtities:  No clubbing cyanosis or edema feet shiny pink but warm  , no acute joint swelling or redness no focal atrophy NEURO:  Oriented x3, cranial nerves 3-12 appear to be intact, no obvious focal weakness,gait within normal  limits  SKIN: No acute rashes normal turgor, color, no bruising or petechiae. PSYCH: Oriented, good eye contact, no obvious depression anxiety, cognition and judgment appear normal. LN: no cervical axillary inguinal adenopathy No noted deficits in memory, attention, and speech. Diabetic Foot Exam - Simple   Simple Foot Form Diabetic Foot exam was performed with the following findings:  Yes 08/09/2018  9:04 AM  Visual Inspection No deformities, no ulcerations, no other skin breakdown bilaterally:  Yes Sensation Testing Intact to touch and monofilament testing bilaterally:  Yes Pulse Check Posterior Tibialis and Dorsalis pulse intact bilaterally:  Yes Comments Shiny skin on feet dec hair  And some  Redness but no ulcers skin break down  Warm to t ouch        Lab Results  Component Value Date   WBC 6.7 08/09/2018   HGB 16.7 (H) 08/09/2018   HCT 48.8 (H) 08/09/2018   PLT 176.0 08/09/2018   GLUCOSE 133 (H) 05/20/2018   CHOL 147 05/20/2018   TRIG 156.0 (H) 05/20/2018   HDL 28.70 (L) 05/20/2018   LDLDIRECT 87.0 04/24/2015   LDLCALC 87 05/20/2018   ALT 17 05/20/2018   AST 16 05/20/2018   NA 139 05/20/2018   K 4.4 05/20/2018   CL 104 05/20/2018   CREATININE 0.78 05/20/2018   BUN 17 05/20/2018   CO2 28 05/20/2018   TSH 4.13 07/17/2015   INR 1.06 02/01/2012   HGBA1C 6.7 (H) 05/20/2018   MICROALBUR 0.8 08/09/2018    ASSESSMENT AND PLAN:  Discussed the following assessment and plan:  Visit for preventive health examination  Medication management  Essential hypertension  TOBACCO USER - kess than 1/2ppd but still daily continue cesattion impotacne  Diabetes mellitus with peripheral vascular disease ([place]) - Plan: CBC with Differential/Platelet, POCT Urinalysis Dipstick (Automated), Microalbumin / creatinine urine ratio  History of ventricular fibrillation  Atherosclerosis of coronary artery bypass graft of native heart without angina pectoris  Elevated hemoglobin  ([place]) - presumed from  tobacco use with sligh elevation erythropoitin but  no osa sx prob not heamtologic  - Plan: CBC with Differential/Platelet, POCT Urinalysis Dipstick (Automated), Microalbumin / creatinine urine ratio  Need for pneumococcal vaccination - Plan: Pneumococcal polysaccharide vaccine 23-valent greater than or equal to 2yo subcutaneous/IM  Hyperlipidemia associated with type 2 diabetes mellitus ([place]) - added etia in january  plan recheck aftre 3-6 mos  lipids  consdier vascepa if tg still up .  Disc   Colon screen  Shared Decision Making   Cost was an issue last time will think about it  Updated pneumovax  shingrix elsewhere  copay would be  40$   Had zostavax  Has rov in 6 mos    Consider vascepa if  tg up  Depending on   Next lipid results.   ldl goal below 70 but is non max statin   Patient Care Team: Panosh, Standley Brooking, MD as PCP - General Bartle, Fernande Boyden, MD (Cardiothoracic Surgery) Early, Arvilla Meres, MD (Thoracic Diseases) [name], MD as Consulting Physician (Endocrinology)  Patient Instructions   Reconsider cologuard stool test . If positive to colonsocopy  To lookf or precancer and decrease the risk of future cancer   [place] know if you change your mind about getting cologuard and we can send an order for this ( they send the lkit to your home)  Still work on stop tobacco or  Minimizing       Preventive Care 65 Years and Older, Female Preventive care refers to lifestyle choices and visits with your health care provider that can promote health and wellness. What does preventive care include?  A yearly physical exam. This is also called an annual well check.  Dental exams once or twice a year.  Routine eye exams. Ask your health care provider how often you should have your eyes checked.  Personal lifestyle choices, including: ? Daily care of your teeth and gums. ? Regular physical activity. ? Eating a healthy diet. ? Avoiding tobacco and drug use. ? Limiting  alcohol use. ? Practicing safe sex. ? Taking low-dose aspirin every day. ? Taking vitamin and mineral supplements as recommended by your health care provider. What happens during an annual well check? The services and screenings done by your health care provider during your annual well check will depend on your age, overall health, lifestyle risk factors, and family history of disease. Counseling Your health care provider may ask you questions about your:  Alcohol use.  Tobacco use.  Drug use.  Emotional well-being.  Home and relationship well-being.  Sexual activity.  Eating habits.  History of falls.  Memory and ability to understand (cognition).  Work and work Statistician.  Reproductive health.  Screening You may have the following tests or measurements:  Height, weight, and BMI.  Blood pressure.  Lipid and cholesterol levels. These may be checked every 5 years, or more frequently if you are over 45 years old.  Skin check.  Lung cancer screening. You may have this screening every year starting at age 81 if you have a 30-pack-year history of smoking and currently smoke or have quit within the past 15 years.  Colorectal cancer screening. All adults should have this screening  starting at age 36 and continuing until age 35. You will have tests every 1-10 years, depending on your results and the type of screening test. People at increased risk should start screening at an earlier age. Screening tests may include: ? Guaiac-based fecal occult blood testing. ? Fecal immunochemical test (FIT). ? Stool DNA test. ? Virtual colonoscopy. ? Sigmoidoscopy. During this test, a flexible tube with a tiny camera (sigmoidoscope) is used to examine your rectum and lower colon. The sigmoidoscope is inserted through your anus into your rectum and lower colon. ? Colonoscopy. During this test, a long, thin, flexible tube with a tiny camera (colonoscope) is used to examine your entire  colon and rectum.  Hepatitis C blood test.  Hepatitis B blood test.  Sexually transmitted disease (STD) testing.  Diabetes screening. This is done by checking your blood sugar (glucose) after you have not eaten for a while (fasting). You may have this done every 1-3 years.  Bone density scan. This is done to screen for osteoporosis. You may have this done starting at age 22.  Mammogram. This may be done every 1-2 years. Talk to your health care provider about how often you should have regular mammograms. Talk with your health care provider about your test results, treatment options, and if necessary, the need for more tests. Vaccines Your health care provider may recommend certain vaccines, such as:  Influenza vaccine. This is recommended every year.  Tetanus, diphtheria, and acellular pertussis (Tdap, Td) vaccine. You may need a Td booster every 10 years.  Varicella vaccine. You may need this if you have not been vaccinated.  Zoster vaccine. You may need this after age 69.  Measles, mumps, and rubella (MMR) vaccine. You may need at least one dose of MMR if you were born in 1957 or later. You may also need a second dose.  Pneumococcal 13-valent conjugate (PCV13) vaccine. One dose is recommended after age 76.  Pneumococcal polysaccharide (PPSV23) vaccine. One dose is recommended after age 14.  Meningococcal vaccine. You may need this if you have certain conditions.  Hepatitis A vaccine. You may need this if you have certain conditions or if you travel or work in places where you may be exposed to hepatitis A.  Hepatitis B vaccine. You may need this if you have certain conditions or if you travel or work in places where you may be exposed to hepatitis B.  Haemophilus influenzae type b (Hib) vaccine. You may need this if you have certain conditions. Talk to your health care provider about which screenings and vaccines you need and how often you need them. This information is not  intended to replace advice given to you by your health care provider. Make sure you discuss any questions you have with your health care provider. Document Released: 06/29/2015 Document Revised: 07/23/2017 Document Reviewed: 04/03/2015 Elsevier Interactive Patient Education  2019 [employer].  Immunization History  Administered Date(s) Administered  . Influenza Split 03/31/2011, 03/31/2012  . Influenza Whole 03/22/2008, 04/25/2009, 03/12/2010  . Influenza, High Dose Seasonal PF 03/16/2018  . Influenza,inj,Quad PF,6+ Mos 04/05/2013, 02/21/2014, 05/01/2015, 03/25/2016  . Pneumococcal Conjugate-13 02/03/2017  . Pneumococcal Polysaccharide-23 06/16/1998, 07/24/2009  . Td 01/30/2009  . Zoster 01/18/2014      Standley Brooking. Panosh M.D.

## 2018-08-06 NOTE — Patient Instructions (Addendum)
Reconsider cologuard stool test . If positive to colonsocopy  To lookf or precancer and decrease the risk of future cancer   [place] know if you change your mind about getting cologuard and we can send an order for this ( they send the lkit to your home)  Still work on stop tobacco or  Minimizing       Preventive Care 65 Years and Older, Female Preventive care refers to lifestyle choices and visits with your health care provider that can promote health and wellness. What does preventive care include?  A yearly physical exam. This is also called an annual well check.  Dental exams once or twice a year.  Routine eye exams. Ask your health care provider how often you should have your eyes checked.  Personal lifestyle choices, including: ? Daily care of your teeth and gums. ? Regular physical activity. ? Eating a healthy diet. ? Avoiding tobacco and drug use. ? Limiting alcohol use. ? Practicing safe sex. ? Taking low-dose aspirin every day. ? Taking vitamin and mineral supplements as recommended by your health care provider. What happens during an annual well check? The services and screenings done by your health care provider during your annual well check will depend on your age, overall health, lifestyle risk factors, and family history of disease. Counseling Your health care provider may ask you questions about your:  Alcohol use.  Tobacco use.  Drug use.  Emotional well-being.  Home and relationship well-being.  Sexual activity.  Eating habits.  History of falls.  Memory and ability to understand (cognition).  Work and work Statistician.  Reproductive health.  Screening You may have the following tests or measurements:  Height, weight, and BMI.  Blood pressure.  Lipid and cholesterol levels. These may be checked every 5 years, or more frequently if you are over 104 years old.  Skin check.  Lung cancer screening. You may have this screening every year  starting at age 38 if you have a 30-pack-year history of smoking and currently smoke or have quit within the past 15 years.  Colorectal cancer screening. All adults should have this screening starting at age 28 and continuing until age 26. You will have tests every 1-10 years, depending on your results and the type of screening test. People at increased risk should start screening at an earlier age. Screening tests may include: ? Guaiac-based fecal occult blood testing. ? Fecal immunochemical test (FIT). ? Stool DNA test. ? Virtual colonoscopy. ? Sigmoidoscopy. During this test, a flexible tube with a tiny camera (sigmoidoscope) is used to examine your rectum and lower colon. The sigmoidoscope is inserted through your anus into your rectum and lower colon. ? Colonoscopy. During this test, a long, thin, flexible tube with a tiny camera (colonoscope) is used to examine your entire colon and rectum.  Hepatitis C blood test.  Hepatitis B blood test.  Sexually transmitted disease (STD) testing.  Diabetes screening. This is done by checking your blood sugar (glucose) after you have not eaten for a while (fasting). You may have this done every 1-3 years.  Bone density scan. This is done to screen for osteoporosis. You may have this done starting at age 50.  Mammogram. This may be done every 1-2 years. Talk to your health care provider about how often you should have regular mammograms. Talk with your health care provider about your test results, treatment options, and if necessary, the need for more tests. Vaccines Your health care provider may recommend certain  vaccines, such as:  Influenza vaccine. This is recommended every year.  Tetanus, diphtheria, and acellular pertussis (Tdap, Td) vaccine. You may need a Td booster every 10 years.  Varicella vaccine. You may need this if you have not been vaccinated.  Zoster vaccine. You may need this after age 57.  Measles, mumps, and rubella (MMR)  vaccine. You may need at least one dose of MMR if you were born in 1957 or later. You may also need a second dose.  Pneumococcal 13-valent conjugate (PCV13) vaccine. One dose is recommended after age 58.  Pneumococcal polysaccharide (PPSV23) vaccine. One dose is recommended after age 24.  Meningococcal vaccine. You may need this if you have certain conditions.  Hepatitis A vaccine. You may need this if you have certain conditions or if you travel or work in places where you may be exposed to hepatitis A.  Hepatitis B vaccine. You may need this if you have certain conditions or if you travel or work in places where you may be exposed to hepatitis B.  Haemophilus influenzae type b (Hib) vaccine. You may need this if you have certain conditions. Talk to your health care provider about which screenings and vaccines you need and how often you need them. This information is not intended to replace advice given to you by your health care provider. Make sure you discuss any questions you have with your health care provider. Document Released: 06/29/2015 Document Revised: 07/23/2017 Document Reviewed: 04/03/2015 Elsevier Interactive Patient Education  2019 [employer].  Immunization History  Administered Date(s) Administered  . Influenza Split 03/31/2011, 03/31/2012  . Influenza Whole 03/22/2008, 04/25/2009, 03/12/2010  . Influenza, High Dose Seasonal PF 03/16/2018  . Influenza,inj,Quad PF,6+ Mos 04/05/2013, 02/21/2014, 05/01/2015, 03/25/2016  . Pneumococcal Conjugate-13 02/03/2017  . Pneumococcal Polysaccharide-23 06/16/1998, 07/24/2009  . Td 01/30/2009  . Zoster 01/18/2014

## 2018-08-09 ENCOUNTER — Ambulatory Visit (INDEPENDENT_AMBULATORY_CARE_PROVIDER_SITE_OTHER): Payer: PPO | Admitting: Internal Medicine

## 2018-08-09 ENCOUNTER — Encounter: Payer: Self-pay | Admitting: Internal Medicine

## 2018-08-09 VITALS — BP 134/70 | HR 86 | Temp 97.9°F | Ht 63.0 in | Wt 157.2 lb

## 2018-08-09 DIAGNOSIS — E1169 Type 2 diabetes mellitus with other specified complication: Secondary | ICD-10-CM | POA: Diagnosis not present

## 2018-08-09 DIAGNOSIS — I25810 Atherosclerosis of coronary artery bypass graft(s) without angina pectoris: Secondary | ICD-10-CM | POA: Diagnosis not present

## 2018-08-09 DIAGNOSIS — D582 Other hemoglobinopathies: Secondary | ICD-10-CM

## 2018-08-09 DIAGNOSIS — E785 Hyperlipidemia, unspecified: Secondary | ICD-10-CM | POA: Diagnosis not present

## 2018-08-09 DIAGNOSIS — Z23 Encounter for immunization: Secondary | ICD-10-CM

## 2018-08-09 DIAGNOSIS — Z79899 Other long term (current) drug therapy: Secondary | ICD-10-CM

## 2018-08-09 DIAGNOSIS — F17200 Nicotine dependence, unspecified, uncomplicated: Secondary | ICD-10-CM

## 2018-08-09 DIAGNOSIS — I10 Essential (primary) hypertension: Secondary | ICD-10-CM

## 2018-08-09 DIAGNOSIS — Z8679 Personal history of other diseases of the circulatory system: Secondary | ICD-10-CM

## 2018-08-09 DIAGNOSIS — Z0000 Encounter for general adult medical examination without abnormal findings: Secondary | ICD-10-CM

## 2018-08-09 DIAGNOSIS — E1151 Type 2 diabetes mellitus with diabetic peripheral angiopathy without gangrene: Secondary | ICD-10-CM | POA: Diagnosis not present

## 2018-08-09 LAB — CBC WITH DIFFERENTIAL/PLATELET
Basophils Absolute: 0 10*3/uL (ref 0.0–0.1)
Basophils Relative: 0.6 % (ref 0.0–3.0)
Eosinophils Absolute: 0.2 10*3/uL (ref 0.0–0.7)
Eosinophils Relative: 2.6 % (ref 0.0–5.0)
HCT: 48.8 % — ABNORMAL HIGH (ref 36.0–46.0)
Hemoglobin: 16.7 g/dL — ABNORMAL HIGH (ref 12.0–15.0)
Lymphocytes Relative: 23.4 % (ref 12.0–46.0)
Lymphs Abs: 1.6 10*3/uL (ref 0.7–4.0)
MCHC: 34.2 g/dL (ref 30.0–36.0)
MCV: 86.8 fl (ref 78.0–100.0)
MONO ABS: 0.5 10*3/uL (ref 0.1–1.0)
Monocytes Relative: 7.8 % (ref 3.0–12.0)
NEUTROS ABS: 4.4 10*3/uL (ref 1.4–7.7)
Neutrophils Relative %: 65.6 % (ref 43.0–77.0)
Platelets: 176 10*3/uL (ref 150.0–400.0)
RBC: 5.62 Mil/uL — ABNORMAL HIGH (ref 3.87–5.11)
RDW: 15.2 % (ref 11.5–15.5)
WBC: 6.7 10*3/uL (ref 4.0–10.5)

## 2018-08-09 LAB — POC URINALSYSI DIPSTICK (AUTOMATED)
Bilirubin, UA: NEGATIVE
Blood, UA: NEGATIVE
Glucose, UA: POSITIVE — AB
Ketones, UA: NEGATIVE
Leukocytes, UA: NEGATIVE
Nitrite, UA: NEGATIVE
Protein, UA: NEGATIVE
SPEC GRAV UA: 1.02 (ref 1.010–1.025)
Urobilinogen, UA: 0.2 E.U./dL
pH, UA: 6 (ref 5.0–8.0)

## 2018-08-09 LAB — MICROALBUMIN / CREATININE URINE RATIO
Creatinine,U: 79.1 mg/dL
Microalb Creat Ratio: 1.1 mg/g (ref 0.0–30.0)
Microalb, Ur: 0.8 mg/dL (ref 0.0–1.9)

## 2018-09-03 ENCOUNTER — Other Ambulatory Visit: Payer: Self-pay | Admitting: Internal Medicine

## 2018-09-03 ENCOUNTER — Telehealth: Payer: Self-pay | Admitting: Endocrinology

## 2018-09-03 ENCOUNTER — Other Ambulatory Visit: Payer: Self-pay

## 2018-09-03 MED ORDER — INSULIN GLARGINE 100 UNIT/ML SOLOSTAR PEN: PEN_INJECTOR | SUBCUTANEOUS | 0 refills | Status: DC

## 2018-09-03 NOTE — Telephone Encounter (Signed)
MEDICATION: Lantus Solostart  PHARMACY:  [facility] [address]  IS THIS A 90 DAY SUPPLY : no  IS PATIENT OUT OF MEDICATION: no  IF NOT; HOW MUCH IS LEFT: 2 days worth  LAST APPOINTMENT DATE: @2 /09/2018  NEXT APPOINTMENT DATE:@4 /11/2018  DO WE HAVE YOUR PERMISSION TO LEAVE A DETAILED MESSAGE: yes  OTHER COMMENTS:    **Let patient know to contact pharmacy at the end of the day to make sure medication is ready. **  ** Please notify patient to allow 48-72 hours to process**  **Encourage patient to contact the pharmacy for refills or they can request refills through [facility]**

## 2018-09-03 NOTE — Telephone Encounter (Signed)
Rx sent 

## 2018-09-04 ENCOUNTER — Other Ambulatory Visit: Payer: Self-pay | Admitting: Endocrinology

## 2018-09-07 DIAGNOSIS — S43402A Unspecified sprain of left shoulder joint, initial encounter: Secondary | ICD-10-CM | POA: Diagnosis not present

## 2018-09-20 ENCOUNTER — Other Ambulatory Visit (INDEPENDENT_AMBULATORY_CARE_PROVIDER_SITE_OTHER): Payer: PPO

## 2018-09-20 ENCOUNTER — Other Ambulatory Visit: Payer: Self-pay

## 2018-09-20 DIAGNOSIS — Z794 Long term (current) use of insulin: Secondary | ICD-10-CM | POA: Diagnosis not present

## 2018-09-20 DIAGNOSIS — E1165 Type 2 diabetes mellitus with hyperglycemia: Secondary | ICD-10-CM

## 2018-09-20 DIAGNOSIS — D696 Thrombocytopenia, unspecified: Secondary | ICD-10-CM | POA: Diagnosis not present

## 2018-09-20 LAB — BASIC METABOLIC PANEL
BUN: 18 mg/dL (ref 6–23)
CO2: 28 mEq/L (ref 19–32)
Calcium: 10.2 mg/dL (ref 8.4–10.5)
Chloride: 105 mEq/L (ref 96–112)
Creatinine, Ser: 0.8 mg/dL (ref 0.40–1.20)
GFR: 71.6 mL/min (ref >60.00)
Glucose, Bld: 128 mg/dL — ABNORMAL HIGH (ref 70–99)
Potassium: 4.9 mEq/L (ref 3.5–5.1)
Sodium: 139 mEq/L (ref 135–145)

## 2018-09-20 LAB — HEMOGLOBIN A1C: Hgb A1c MFr Bld: 7.6 % — ABNORMAL HIGH (ref 4.6–6.5)

## 2018-09-23 ENCOUNTER — Ambulatory Visit (INDEPENDENT_AMBULATORY_CARE_PROVIDER_SITE_OTHER): Payer: PPO | Admitting: Endocrinology

## 2018-09-23 ENCOUNTER — Encounter: Payer: Self-pay | Admitting: Endocrinology

## 2018-09-23 ENCOUNTER — Other Ambulatory Visit: Payer: Self-pay

## 2018-09-23 DIAGNOSIS — Z794 Long term (current) use of insulin: Secondary | ICD-10-CM | POA: Diagnosis not present

## 2018-09-23 DIAGNOSIS — E559 Vitamin D deficiency, unspecified: Secondary | ICD-10-CM

## 2018-09-23 DIAGNOSIS — E1165 Type 2 diabetes mellitus with hyperglycemia: Secondary | ICD-10-CM | POA: Diagnosis not present

## 2018-09-23 DIAGNOSIS — E7800 Pure hypercholesterolemia, unspecified: Secondary | ICD-10-CM | POA: Diagnosis not present

## 2018-09-23 NOTE — Progress Notes (Signed)
Patient ID: Kimberly [name], female   DOB: [birth year], 68 y.o.   MRN: [id number]           Reason for Appointment: Endocrinology follow-up   Today's office visit was provided via telemedicine using telephone to patient . Consent for the patient has been obtained . Location of the patient: Home . Location of the provider: Office Only the patient and myself were participating in the encounter  patient blood sugars were obtained on the phone from patient's meter and her labs were reviewed as well as relevant sections of her chart   History of Present Illness:          Date of diagnosis of type 2 diabetes mellitus: 1995        Background history:   For several years she had been tried on oral agents with variable control.  Detailed history not available She thinks that she had nausea and vomiting with metformin and could not take much Also had been tried on Amaryl  She thinks she had swelling with Januvia and not clear what other medications she has tried before  She was started on insulin in 07/2013 when her A1c was over 11% Because of her poor control and A1c of 9.1 she was started on Invokana 100 mg daily in 12/16  Recent history:   INSULIN regimen is: 38 Lantus hs   Novolog 15  units before lunch. Novolog 10 acs    Her A1c is unusually high at 7.6, was 6.7  Current blood sugar patterns, management and problems identified:  She was not aware that her blood sugars were overall higher than before  Previously had not brought her monitor for download for comparison of today's readings  Although she did get some meloxicam for shoulder pain last month she did not have any steroids and she is blaming her high sugars on the medication  Since she has been more homebound she has been probably been eating more and at times slightly more carbohydrate with sporadic high readings  Her postprandial readings are inconsistently high both after lunch and dinner  However this week her readings  are more consistently in target  She was advised to take 40 units of Lantus if her sugars are consistently high in the morning but the last 3 readings are below 120 in the morning and she is still taking 38 units  She thinks she has gained some weight, not clear how much  She does take her Invokana regularly and no side effects   Non-insulin hypoglycemic drugs the patient is taking are:  Invokana 100 mg daily      Side effects from medications have been: Nausea from metformin,?  Swelling from Januvia  Compliance with the medical regimen: Improving now  Glucose monitoring:  done 1-up to 3 times a day         Glucometer:  Contour       Blood Glucose readings by review of blood sugar readings on the phone   PRE-MEAL Fasting Lunch Dinner Bedtime Overall  Glucose range:  116-187      Mean/median:     146   POST-MEAL PC Breakfast PC Lunch PC Dinner  Glucose range:   86-190  94-206  Mean/median:        Self-care: The diet that the patient has been following is: tries to limit fats, breads.     Meal times are:  Lunch: 12 pm Dinner: 5-7 PM   Typical meal intake: Breakfast is oatmeal or  grits             Dietician visit, most recent: 09/2015               Exercise:  walking at gym 3/7 days a week   Weight history:  Wt Readings from Last 3 Encounters:  08/09/18 157 lb 3.2 oz (71.3 kg)  05/24/18 157 lb 6.4 oz (71.4 kg)  02/04/18 157 lb 12.8 oz (71.6 kg)    Glycemic control:   Lab Results  Component Value Date   HGBA1C 7.6 (H) 09/20/2018   HGBA1C 6.7 (H) 05/20/2018   HGBA1C 6.7 (H) 01/18/2018   Lab Results  Component Value Date   MICROALBUR 0.8 08/09/2018   LDLCALC 87 05/20/2018   CREATININE 0.80 09/20/2018       Allergies as of 09/23/2018      Reactions   Metformin And Related Nausea And Vomiting   After one pill at hospital   Januvia [sitagliptin Phosphate] Other (See Comments)   Swelling and edema see 4 13 OV      Medication List       Accurate as of September 23, 2018 10:02 AM. Always use your most recent med list.        albuterol 108 (90 Base) MCG/ACT inhaler Commonly known as:  PROVENTIL HFA;VENTOLIN HFA Inhale 2 puffs into the lungs every 6 (six) hours as needed. For shortness of breath or wheezing   aspirin 325 MG EC tablet Take 325 mg by mouth every morning.   atorvastatin 80 MG tablet Commonly known as:  LIPITOR TAKE 1 TABLET BY MOUTH EVERY DAY   canagliflozin 100 MG Tabs tablet Commonly known as:  Invokana TAKE 1 TABLET BY MOUTH BEFORE BREAKFAST   ezetimibe 10 MG tablet Commonly known as:  Zetia Take 1 tablet (10 mg total) by mouth daily.   glucose blood test strip 1 each by Other route as needed for other. Use as instructed to check blood sugar 4 times daily. DX:E11.9   Insulin Glargine 100 UNIT/ML Solostar Pen Commonly known as:  Lantus SoloStar INJECT 38 UNITS UNDER THE SKIN DAILY   Insulin Pen Needle 32G X 4 MM Misc Commonly known as:  BD Pen Needle Nano U/F USE WITH LEVEMIR FLEX PEN   lisinopril-hydrochlorothiazide 20-12.5 MG tablet Commonly known as:  PRINZIDE,ZESTORETIC Take 0.5 tablets by mouth daily. Increase to 1 po qd as directed   NovoLOG FlexPen 100 UNIT/ML FlexPen Generic drug:  insulin aspart INJECT 18 UNITS UNDER THE SKIN AT LARGEST MEAL AND 10 UNITS AT [facility] Verio w/Device Kit 1 each by Does not apply route daily. Use blood sugar meter to check blood sugar. DX:E11.9       Allergies:  Allergies  Allergen Reactions  . Metformin And Related Nausea And Vomiting    After one pill at hospital  . Januvia [Sitagliptin Phosphate] Other (See Comments)    Swelling and edema see 4 13 OV    Past Medical History:  Diagnosis Date  . CAD (coronary artery disease)   . Colitis   . Colitis, indeterminate 01/31/2012  . COPD (chronic obstructive pulmonary disease) ([place])   . Depression 05/16/2017  . Depression with anxiety 05/30/2016  . Diverticulitis 02/10/2012  . DM (diabetes mellitus) ([place])    . Glaucoma   . Glaucoma   . History of ventricular fibrillation   . HTN (hypertension)   . Myocardial infarction ([place])    12/1998, 01/2011  . Osteoporosis   . PVD (peripheral  vascular disease) [facility])     Past Surgical History:  Procedure Laterality Date  . ABDOMINAL HYSTERECTOMY     endometriosis  . CABG x 3  01/22/2011   Dr Cyndia Bent  . CARDIAC CATHETERIZATION  01/17/2011   Dr Arvella Merles  . LIPOMA EXCISION Left 02/08/2015   Procedure: EXCISION OF LEFT THIGH LIPOMA;  Surgeon: Greer Pickerel, MD;  Location: [facility];  Service: General;  Laterality: Left;  . S/P aortobifemoral bypass  2008   Dr Donnetta Hutching  . S/P myocardial infarction and PCI of the lt circumflex  2000    Family History  Problem Relation Age of Onset  . Heart attack Father   . Diabetes Father   . Heart disease Father   . Diabetes Brother   . Diabetes Mother   . Liver cancer Mother   . Hypertension Unknown   . Thyroid disease Unknown        sisters- sister had parathyroidectomy  . Alcohol abuse Unknown        brother  . Colon polyps Maternal Uncle   . Breast cancer Sister   . Diabetes Sister     Social History:  reports that she has been smoking cigarettes. She has been smoking about 0.40 packs per day. She has never used smokeless tobacco. She reports that she does not drink alcohol or use drugs.    Review of Systems    Lipid history: On 80 mg Lipitor with history of CAD Prescribed by PCP Her LDL is still not below 70  Lab Results  Component Value Date   CHOL 147 05/20/2018   HDL 28.70 (L) 05/20/2018   LDLCALC 87 05/20/2018   LDLDIRECT 87.0 04/24/2015   TRIG 156.0 (H) 05/20/2018   CHOLHDL 5 05/20/2018           Hypertension: Present for several years Zestoretic  was reduced to half a tablet with starting Invokana previously and blood pressure is stable   BP Readings from Last 3 Encounters:  08/09/18 134/70  05/24/18 128/70  02/04/18 112/62    HYPERCALCEMIA: She has had periodic mild  hypercalcemia since about 2010; PTH levels in the mid-upper range Calcium is still upper normal   She is taking 1000 units of vitamin D with adequate levels  No history of fractures  Her last bone density was in 2/19 and this showed T score of -1.2 indicating mild osteopenia at the hip, her spine also shows a relative decline  She was tried on Evista but she says it was causing her hot flashes and night sweats and she stopped taking this  Previously in 07/2015 T score -2.3 was at the wrist, this was not measured in 2019  Most recent eye exam was normal in 9/29 and she does go every year  Most recent foot exam: 07/2018 by PCP  Review of Systems     LABS:  Lab on 09/20/2018  Component Date Value Ref Range Status  . Sodium 09/20/2018 139  135 - 145 mEq/L Final  . Potassium 09/20/2018 4.9  3.5 - 5.1 mEq/L Final  . Chloride 09/20/2018 105  96 - 112 mEq/L Final  . CO2 09/20/2018 28  19 - 32 mEq/L Final  . Glucose, Bld 09/20/2018 128* 70 - 99 mg/dL Final  . BUN 09/20/2018 18  6 - 23 mg/dL Final  . Creatinine, Ser 09/20/2018 0.80  0.40 - 1.20 mg/dL Final  . Calcium 09/20/2018 10.2  8.4 - 10.5 mg/dL Final  . GFR 09/20/2018 71.60  >  60.00 mL/min Final  . Hgb A1c MFr Bld 09/20/2018 7.6* 4.6 - 6.5 % Final   Glycemic Control Guidelines for People with Diabetes:Non Diabetic:  <6%Goal of Therapy: <7%Additional Action Suggested:  >8%     Physical Examination:  There were no vitals taken for this visit.   ASSESSMENT:  Diabetes type 2, BMI 28  See history of present illness for detailed discussion of his current management, blood sugar patterns and problems identified  A1c is slightly higher at 7.6, previously stable at 6.7.  Results were discussed with patient and discuss target of under 7%  She is on a basal bolus insulin regimen along with Invokana 100 mg Although she is fairly consistent with rotating her glucose monitoring between fasting and postprandial she has had inconsistent  blood sugars Patient admits to a poor lifestyle recently with not watching her diet and not exercising, has gained weight In the last few days her blood sugars are somewhat better as she is trying to do better with her diet   HYPERCALCEMIA: Calcium is continuing to be normal now  LIPIDS: To have labs checked again on the next visit  Renal function is stable, currently on Invokana  OSTEOPENIA she will have follow-up bone density in 2021 Vitamin D level to be reassessed on her next visit, to continue supplement    PLAN:   She will start walking as planned No change in insulin unless blood sugars are consistently high She will let us know if she has any questions about insulin adjustment She will stay on Invokana also  Follow-up in 3 months  Total visit time for today's visit =12 minutes  There are no Patient Instructions on file for this visit.  [name] 09/23/2018, 10:02 AM   Note: This office note was prepared with Dragon voice recognition system technology. Any transcriptional errors that result from this process are unintentional.

## 2018-09-29 ENCOUNTER — Other Ambulatory Visit: Payer: Self-pay | Admitting: Endocrinology

## 2018-10-07 ENCOUNTER — Other Ambulatory Visit: Payer: Self-pay | Admitting: Endocrinology

## 2018-10-11 ENCOUNTER — Other Ambulatory Visit: Payer: Self-pay | Admitting: *Deleted

## 2018-10-11 NOTE — Patient Outreach (Signed)
HTA HRA follow up call.   Kimberly [name] was very nice participating in an initial evaluation today. Confirmed name and DOB.   She is eligible for the CSNP program. Hx of DM II managed by Dr. Dwyane Dee and making good progress. CAD with hx MI, CABG, HTN, Hyperlipidemia and is a current smoker (1 pack per day). She also is slightly overweight and is trying to loose wt. Her personal goal is 130# and she is now 160#. She does have a scale at home.  Her fasting glucose levels are usually 100-120, however today it ws 138. She said her NFBS after dinner last night was 110 and she says this reading is always under 160. Her last HgbA1C was 7.6.  She is on atorvastatin and zetia. Her last Chol was in normal parameter: 147, Trig, and LDL elevated ( 156, 87 respectfully) Her HDL is low 28.7.  She does monitor her BP at home also which she says is usually around 110/70.  Her Hgb has also been slightly elevated over the last several years, (polycythemia)16.7 in Feb 2020.  I will refer for the C-SNP program. If they determine she is not eligible then I will follow her.  Eulah Pont. Myrtie Neither, MSN, [facility] Gerontological Nurse Practitioner [facility] [phone]

## 2018-10-23 ENCOUNTER — Other Ambulatory Visit: Payer: Self-pay | Admitting: Endocrinology

## 2018-10-30 ENCOUNTER — Other Ambulatory Visit: Payer: Self-pay | Admitting: Endocrinology

## 2018-11-13 ENCOUNTER — Other Ambulatory Visit: Payer: Self-pay | Admitting: Endocrinology

## 2018-11-24 ENCOUNTER — Other Ambulatory Visit (INDEPENDENT_AMBULATORY_CARE_PROVIDER_SITE_OTHER): Payer: PPO

## 2018-11-24 ENCOUNTER — Other Ambulatory Visit: Payer: Self-pay

## 2018-11-24 ENCOUNTER — Telehealth: Payer: Self-pay

## 2018-11-24 DIAGNOSIS — E559 Vitamin D deficiency, unspecified: Secondary | ICD-10-CM

## 2018-11-24 DIAGNOSIS — E7800 Pure hypercholesterolemia, unspecified: Secondary | ICD-10-CM

## 2018-11-24 DIAGNOSIS — Z794 Long term (current) use of insulin: Secondary | ICD-10-CM | POA: Diagnosis not present

## 2018-11-24 DIAGNOSIS — E1165 Type 2 diabetes mellitus with hyperglycemia: Secondary | ICD-10-CM | POA: Diagnosis not present

## 2018-11-24 LAB — COMPREHENSIVE METABOLIC PANEL
ALT: 17 U/L (ref 0–35)
AST: 15 U/L (ref 0–37)
Albumin: 4.1 g/dL (ref 3.5–5.2)
Alkaline Phosphatase: 86 U/L (ref 39–117)
BUN: 25 mg/dL — ABNORMAL HIGH (ref 6–23)
CO2: 27 mEq/L (ref 19–32)
Calcium: 9.8 mg/dL (ref 8.4–10.5)
Chloride: 103 mEq/L (ref 96–112)
Creatinine, Ser: 0.8 mg/dL (ref 0.40–1.20)
GFR: 71.56 mL/min (ref >60.00)
Glucose, Bld: 199 mg/dL — ABNORMAL HIGH (ref 70–99)
Potassium: 4.3 mEq/L (ref 3.5–5.1)
Sodium: 138 mEq/L (ref 135–145)
Total Bilirubin: 0.5 mg/dL (ref 0.2–1.2)
Total Protein: 6.5 g/dL (ref 6.0–8.3)

## 2018-11-24 LAB — LIPID PANEL
Cholesterol: 102 mg/dL (ref 0–200)
HDL: 22.2 mg/dL — ABNORMAL LOW (ref >39.00)
NonHDL: 79.69
Total CHOL/HDL Ratio: 5
Triglycerides: 303 mg/dL — ABNORMAL HIGH (ref 0.0–149.0)
VLDL: 60.6 mg/dL — ABNORMAL HIGH (ref 0.0–40.0)

## 2018-11-24 LAB — VITAMIN D 25 HYDROXY (VIT D DEFICIENCY, FRACTURES): VITD: 70.06 ng/mL (ref 30.00–100.00)

## 2018-11-24 LAB — HEMOGLOBIN A1C: Hgb A1c MFr Bld: 7.2 % — ABNORMAL HIGH (ref 4.6–6.5)

## 2018-11-24 LAB — LDL CHOLESTEROL, DIRECT: Direct LDL: 54 mg/dL

## 2018-11-24 NOTE — Telephone Encounter (Signed)
lvm for pt to call back to change to virtual visit and set up lab appt also

## 2018-11-24 NOTE — Progress Notes (Signed)
Virtual Visit via Telephone Note  I connected with@ on 11/25/18 at  8:30 AM EDT by telephone and verified that I am speaking with the correct person using two identifiers.   I discussed the limitations, risks, security and privacy concerns of performing an evaluation and management service by telephone and the availability of in person appointments. I also discussed with the patient that there may be a patient responsible charge related to this service. The patient expressed understanding and agreed to proceed.  Location patient: home Location provider: work  office Participants present for the call: patient, provider Patient did not have a visit in the prior 7 days to address this/these issue(s).   History of Present Illness: Kimberly [name]  Presents for fu of lipid  And med management   HLD cad :  Tolerating zetia    No concerns with this.  BP has been "good per patient  Needs refill med  Tobacco up to 1 ppd  Since more  At home with covid but family limits how much she gets.   Can walk a mild before has to stop for lung   resp stopping   No caludication cp   Had a fall with dog  Under feet and had shoulder injury now better  .      Observations/Objective: Patient sounds cheerful and well on the phone. I do not appreciate any SOB. Speech and thought processing are grossly intact. tob 1ppd   Patient reported vitals: NA  Lab Results  Component Value Date   WBC 6.7 08/09/2018   HGB 16.7 (H) 08/09/2018   HCT 48.8 (H) 08/09/2018   PLT 176.0 08/09/2018   GLUCOSE 199 (H) 11/24/2018   CHOL 102 11/24/2018   TRIG 303.0 (H) 11/24/2018   HDL 22.20 (L) 11/24/2018   LDLDIRECT 54.0 11/24/2018   LDLCALC 87 05/20/2018   ALT 17 11/24/2018   AST 15 11/24/2018   NA 138 11/24/2018   K 4.3 11/24/2018   CL 103 11/24/2018   CREATININE 0.80 11/24/2018   BUN 25 (H) 11/24/2018   CO2 27 11/24/2018   TSH 4.13 07/17/2015   INR 1.06 02/01/2012   HGBA1C 7.2 (H) 11/24/2018   MICROALBUR 0.8  08/09/2018    Assessment and Plan:  1. Hyperlipidemia associated with type 2 diabetes mellitus ([place])   2. Medication management   3. Essential hypertension   4. Tobacco use Worse disc risk and advise dc   5. PERIPHERAL VASCULAR DISEASE   6. Polycythemia Presumed secondary to tobacco and resp  No recent pfts done   7. Hypertriglyceridemia consdier   vescepa  dsic   Will get dr Dwyane Dee opinion also and discuss at next visit   8. Diabetes mellitus with peripheral vascular disease ([place])  tolerating zetia   Refill meds  Follow Up Instructions:  Disc vasepa   As possible   decrisk for tg  Disc tobacco  Refill zetia dn bp med  [id number] [id number] [id number] 11-20 [id number] 21-30 I did not refer this patient for an OV in the next 24 hours for this/these issue(s).  I discussed the assessment and treatment plan with the patient. The patient was provided an opportunity to ask questions and all were answered. The patient agreed with the plan and demonstrated an understanding of the instructions.   The patient was advised to call back or seek an in-person evaluation if the symptoms worsen or if the condition fails to improve as anticipated.  I provided 16 minutes of non-face-to-face time  during this encounter.   Shanon Ace, MD

## 2018-11-25 ENCOUNTER — Other Ambulatory Visit: Payer: Self-pay | Admitting: Endocrinology

## 2018-11-25 ENCOUNTER — Ambulatory Visit (INDEPENDENT_AMBULATORY_CARE_PROVIDER_SITE_OTHER): Payer: PPO | Admitting: Internal Medicine

## 2018-11-25 ENCOUNTER — Encounter: Payer: Self-pay | Admitting: Internal Medicine

## 2018-11-25 DIAGNOSIS — I739 Peripheral vascular disease, unspecified: Secondary | ICD-10-CM

## 2018-11-25 DIAGNOSIS — D751 Secondary polycythemia: Secondary | ICD-10-CM

## 2018-11-25 DIAGNOSIS — E1151 Type 2 diabetes mellitus with diabetic peripheral angiopathy without gangrene: Secondary | ICD-10-CM

## 2018-11-25 DIAGNOSIS — E785 Hyperlipidemia, unspecified: Secondary | ICD-10-CM

## 2018-11-25 DIAGNOSIS — E781 Pure hyperglyceridemia: Secondary | ICD-10-CM | POA: Diagnosis not present

## 2018-11-25 DIAGNOSIS — I10 Essential (primary) hypertension: Secondary | ICD-10-CM | POA: Diagnosis not present

## 2018-11-25 DIAGNOSIS — E1169 Type 2 diabetes mellitus with other specified complication: Secondary | ICD-10-CM | POA: Diagnosis not present

## 2018-11-25 DIAGNOSIS — Z720 Tobacco use: Secondary | ICD-10-CM | POA: Diagnosis not present

## 2018-11-25 DIAGNOSIS — Z79899 Other long term (current) drug therapy: Secondary | ICD-10-CM | POA: Diagnosis not present

## 2018-11-25 MED ORDER — LISINOPRIL-HYDROCHLOROTHIAZIDE 20-12.5 MG PO TABS: 0.5000 | ORAL_TABLET | Freq: Every day | ORAL | 3 refills | Status: DC

## 2018-11-25 MED ORDER — EZETIMIBE 10 MG PO TABS: 10.0000 mg | ORAL_TABLET | Freq: Every day | ORAL | 3 refills | Status: DC

## 2018-11-29 ENCOUNTER — Other Ambulatory Visit: Payer: Self-pay | Admitting: Internal Medicine

## 2018-12-18 ENCOUNTER — Other Ambulatory Visit: Payer: Self-pay | Admitting: Endocrinology

## 2018-12-21 ENCOUNTER — Other Ambulatory Visit: Payer: PPO

## 2018-12-24 ENCOUNTER — Ambulatory Visit: Payer: PPO | Admitting: Endocrinology

## 2019-01-20 ENCOUNTER — Other Ambulatory Visit: Payer: Self-pay | Admitting: Internal Medicine

## 2019-01-30 ENCOUNTER — Other Ambulatory Visit: Payer: Self-pay | Admitting: Endocrinology

## 2019-01-30 DIAGNOSIS — E1165 Type 2 diabetes mellitus with hyperglycemia: Secondary | ICD-10-CM

## 2019-01-30 DIAGNOSIS — E782 Mixed hyperlipidemia: Secondary | ICD-10-CM

## 2019-01-30 DIAGNOSIS — Z794 Long term (current) use of insulin: Secondary | ICD-10-CM

## 2019-02-03 ENCOUNTER — Other Ambulatory Visit: Payer: Self-pay

## 2019-02-03 ENCOUNTER — Other Ambulatory Visit (INDEPENDENT_AMBULATORY_CARE_PROVIDER_SITE_OTHER): Payer: PPO

## 2019-02-03 DIAGNOSIS — E782 Mixed hyperlipidemia: Secondary | ICD-10-CM | POA: Diagnosis not present

## 2019-02-03 DIAGNOSIS — Z794 Long term (current) use of insulin: Secondary | ICD-10-CM | POA: Diagnosis not present

## 2019-02-03 DIAGNOSIS — E1165 Type 2 diabetes mellitus with hyperglycemia: Secondary | ICD-10-CM

## 2019-02-03 LAB — BASIC METABOLIC PANEL
BUN: 17 mg/dL (ref 6–23)
CO2: 26 mEq/L (ref 19–32)
Calcium: 10.1 mg/dL (ref 8.4–10.5)
Chloride: 103 mEq/L (ref 96–112)
Creatinine, Ser: 0.79 mg/dL (ref 0.40–1.20)
GFR: 72.57 mL/min (ref >60.00)
Glucose, Bld: 157 mg/dL — ABNORMAL HIGH (ref 70–99)
Potassium: 4.6 mEq/L (ref 3.5–5.1)
Sodium: 140 mEq/L (ref 135–145)

## 2019-02-03 LAB — LIPID PANEL
Cholesterol: 112 mg/dL (ref 0–200)
HDL: 26.2 mg/dL — ABNORMAL LOW (ref >39.00)
LDL Cholesterol: 50 mg/dL (ref 0–99)
NonHDL: 85.5
Total CHOL/HDL Ratio: 4
Triglycerides: 178 mg/dL — ABNORMAL HIGH (ref 0.0–149.0)
VLDL: 35.6 mg/dL (ref 0.0–40.0)

## 2019-02-03 MED ORDER — BD PEN NEEDLE NANO U/F 32G X 4 MM MISC: 3 refills | Status: DC

## 2019-02-04 LAB — FRUCTOSAMINE: Fructosamine: 262 umol/L (ref 0–285)

## 2019-02-07 NOTE — Progress Notes (Signed)
Patient ID: Kimberly [name], female   DOB: [birth year], 69 y.o.   MRN: [id number]           Reason for Appointment: Endocrinology follow-up   Today's office visit was provided via telemedicine using telephone to patient . Consent for the patient has been obtained . Location of the patient: Home . Location of the provider: Office Only the patient and myself were participating in the encounter  patient blood sugars were obtained on the phone from patient's meter and her labs were reviewed as well as relevant sections of her chart   History of Present Illness:          Date of diagnosis of type 2 diabetes mellitus: 1995        Background history:   For several years she had been tried on oral agents with variable control.  Detailed history not available She thinks that she had nausea and vomiting with metformin and could not take much Also had been tried on Amaryl  She thinks she had swelling with Januvia and not clear what other medications she has tried before  She was started on insulin in 07/2013 when her A1c was over 11% Because of her poor control and A1c of 9.1 she was started on Invokana 100 mg daily in 12/16  Recent history:   INSULIN regimen is: 38 Lantus hs   Novolog 15  units before lunch. Novolog 10 acs    Her A1c in June was unusually high at 7.6, previously was 6.7 Fructosamine is now 262  Current blood sugar patterns, management and problems identified:  She is reporting back after 2 months because of high A1c on the last visit  She thinks previously her blood sugars were higher because of poor diet but she thinks she is doing better with somewhat healthier meals  She does have occasional readings in the 170+ range after dinner and rarely after lunch  However morning readings are more consistently higher  She is able to take her NovoLog consistently before each meal  No hypoglycemia  Recently trying to do a little more monitoring than before  However she does  not like to go outside to walk and not doing any other exercise  Her weight is about the same at 158 pounds compared to 157 previously at home  Renal function stable with Invokana   Non-insulin hypoglycemic drugs the patient is taking are:  Invokana 100 mg daily      Side effects from medications have been: Nausea from metformin,?  Swelling from Januvia  Compliance with the medical regimen: Improving now  Glucose monitoring:  done 1-2 times a day         Glucometer:  Contour       Blood Glucose readings   PRE-MEAL Fasting Lunch Dinner Bedtime Overall  Glucose range:  117-173      Mean/median:  143     143   POST-MEAL PC Breakfast PC Lunch PC Dinner  Glucose range:   113-177  97-175  Mean/median:   143  143         PREVIOUS reading         PRE-MEAL Fasting Lunch Dinner Bedtime Overall  Glucose range:  116-187      Mean/median:     146   POST-MEAL PC Breakfast PC Lunch PC Dinner  Glucose range:   86-190  94-206  Mean/median:        Self-care: The diet that the patient has been following is:  tries to limit fats, breads.     Meal times are:  Lunch: 12 pm Dinner: 5-7 PM   Typical meal intake: Breakfast is oatmeal or grits             Dietician visit, most recent: 09/2015               Exercise:  none  Weight history:  Wt Readings from Last 3 Encounters:  08/09/18 157 lb 3.2 oz (71.3 kg)  05/24/18 157 lb 6.4 oz (71.4 kg)  02/04/18 157 lb 12.8 oz (71.6 kg)    Glycemic control:   Lab Results  Component Value Date   HGBA1C 7.2 (H) 11/24/2018   HGBA1C 7.6 (H) 09/20/2018   HGBA1C 6.7 (H) 05/20/2018   Lab Results  Component Value Date   MICROALBUR 0.8 08/09/2018   LDLCALC 50 02/03/2019   CREATININE 0.79 02/03/2019       Allergies as of 02/08/2019      Reactions   Metformin And Related Nausea And Vomiting   After one pill at hospital   Januvia [sitagliptin Phosphate] Other (See Comments)   Swelling and edema see 4 13 OV      Medication List        Accurate as of February 07, 2019  9:01 PM. If you have any questions, ask your nurse or doctor.        albuterol 108 (90 Base) MCG/ACT inhaler Commonly known as: VENTOLIN HFA Inhale 2 puffs into the lungs every 6 (six) hours as needed. For shortness of breath or wheezing   aspirin 325 MG EC tablet Take 325 mg by mouth every morning.   atorvastatin 80 MG tablet Commonly known as: LIPITOR TAKE 1 TABLET BY MOUTH EVERY DAY   BD Pen Needle Nano U/F 32G X 4 MM Misc Generic drug: Insulin Pen Needle Use as instructed to inject 3 insulin 3 times daily.   ezetimibe 10 MG tablet Commonly known as: Zetia Take 1 tablet (10 mg total) by mouth daily.   glucose blood test strip 1 each by Other route as needed for other. Use as instructed to check blood sugar 4 times daily. DX:E11.9   Invokana 100 MG Tabs tablet Generic drug: canagliflozin TAKE 1 TABLET BY MOUTH EVERY DAY BEFORE BREAKFAST   Lantus SoloStar 100 UNIT/ML Solostar Pen Generic drug: Insulin Glargine INJECT 38 UNITS SUBCUTANEOUSLY UNDER THE SKIN DAILY   lisinopril-hydrochlorothiazide 20-12.5 MG tablet Commonly known as: ZESTORETIC Take 0.5 tablets by mouth daily. Increase to 1 po qd as directed   NovoLOG FlexPen 100 UNIT/ML FlexPen Generic drug: insulin aspart INJECT 18 UNITS UNDER THE SKIN AT LARGEST MEAL AND 10 UNITS AT [facility] Verio w/Device Kit 1 each by Does not apply route daily. Use blood sugar meter to check blood sugar. DX:E11.9       Allergies:  Allergies  Allergen Reactions  . Metformin And Related Nausea And Vomiting    After one pill at hospital  . Januvia [Sitagliptin Phosphate] Other (See Comments)    Swelling and edema see 4 13 OV    Past Medical History:  Diagnosis Date  . CAD (coronary artery disease)   . Colitis   . Colitis, indeterminate 01/31/2012  . COPD (chronic obstructive pulmonary disease) ([place])   . Depression 05/16/2017  . Depression with anxiety 05/30/2016  . Diverticulitis  02/10/2012  . DM (diabetes mellitus) ([place])   . Glaucoma   . Glaucoma   . History of ventricular fibrillation   .  HTN (hypertension)   . Myocardial infarction ([place])    12/1998, 01/2011  . Osteoporosis   . PVD (peripheral vascular disease) ([place])     Past Surgical History:  Procedure Laterality Date  . ABDOMINAL HYSTERECTOMY     endometriosis  . CABG x 3  01/22/2011   Dr Cyndia Bent  . CARDIAC CATHETERIZATION  01/17/2011   Dr Arvella Merles  . LIPOMA EXCISION Left 02/08/2015   Procedure: EXCISION OF LEFT THIGH LIPOMA;  Surgeon: Greer Pickerel, MD;  Location: [place];  Service: General;  Laterality: Left;  . S/P aortobifemoral bypass  2008   Dr Donnetta Hutching  . S/P myocardial infarction and PCI of the lt circumflex  2000    Family History  Problem Relation Age of Onset  . Heart attack Father   . Diabetes Father   . Heart disease Father   . Diabetes Brother   . Diabetes Mother   . Liver cancer Mother   . Hypertension Unknown   . Thyroid disease Unknown        sisters- sister had parathyroidectomy  . Alcohol abuse Unknown        brother  . Colon polyps Maternal Uncle   . Breast cancer Sister   . Diabetes Sister     Social History:  reports that she has been smoking cigarettes. She has been smoking about 0.40 packs per day. She has never used smokeless tobacco. She reports that she does not drink alcohol or use drugs.    Review of Systems    Lipid history: On 80 mg Lipitor with history of CAD Prescribed by PCP Her LDL is now improved along with triglycerides  Lab Results  Component Value Date   CHOL 112 02/03/2019   CHOL 102 11/24/2018   CHOL 147 05/20/2018   Lab Results  Component Value Date   HDL 26.20 (L) 02/03/2019   HDL 22.20 (L) 11/24/2018   HDL 28.70 (L) 05/20/2018   Lab Results  Component Value Date   LDLCALC 50 02/03/2019   LDLCALC 87 05/20/2018   LDLCALC 70 06/22/2017   Lab Results  Component Value Date   TRIG 178.0 (H) 02/03/2019   TRIG 303.0 (H)  11/24/2018   TRIG 156.0 (H) 05/20/2018   Lab Results  Component Value Date   CHOLHDL 4 02/03/2019   CHOLHDL 5 11/24/2018   CHOLHDL 5 05/20/2018   Lab Results  Component Value Date   LDLDIRECT 54.0 11/24/2018   LDLDIRECT 87.0 04/24/2015   LDLDIRECT 150.3 07/19/2013            Hypertension: Present for several years Zestoretic  was reduced to half a tablet with starting Invokana previously Blood pressure at home recently 120/80   BP Readings from Last 3 Encounters:  08/09/18 134/70  05/24/18 128/70  02/04/18 112/62    HYPERCALCEMIA: She has had periodic mild hypercalcemia since about 2010; PTH levels in the mid-upper range Calcium is still upper normal   She is taking 1000 units of vitamin D with adequate levels  No history of fractures  Her last bone density was in 2/19 and this showed T score of -1.2 indicating mild osteopenia at the hip, her spine also shows a relative decline  She was tried on Evista but she says it was causing her hot flashes and night sweats and she stopped taking this  Previously in 07/2015 T score -2.3 was at the wrist, this was not measured in 2019  Most recent eye exam was normal in 9/29 and  she does go every year  Most recent foot exam: 07/2018 by PCP  Review of Systems     LABS:  Lab on 02/03/2019  Component Date Value Ref Range Status  . Fructosamine 02/03/2019 262  0 - 285 umol/L Final   Comment: Published reference interval for apparently healthy subjects between age 77 and 71 is 82 - 285 umol/L and in a poorly controlled diabetic population is 228 - 563 umol/L with a mean of 396 umol/L.   Marland Kitchen Sodium 02/03/2019 140  135 - 145 mEq/L Final  . Potassium 02/03/2019 4.6  3.5 - 5.1 mEq/L Final  . Chloride 02/03/2019 103  96 - 112 mEq/L Final  . CO2 02/03/2019 26  19 - 32 mEq/L Final  . Glucose, Bld 02/03/2019 157* 70 - 99 mg/dL Final  . BUN 02/03/2019 17  6 - 23 mg/dL Final  . Creatinine, Ser 02/03/2019 0.79  0.40 - 1.20 mg/dL  Final  . Calcium 02/03/2019 10.1  8.4 - 10.5 mg/dL Final  . GFR 02/03/2019 72.57  >60.00 mL/min Final  . Cholesterol 02/03/2019 112  0 - 200 mg/dL Final   ATP III Classification       Desirable:  < 200 mg/dL               Borderline High:  200 - 239 mg/dL          High:  > = 240 mg/dL  . Triglycerides 02/03/2019 178.0* 0.0 - 149.0 mg/dL Final   Normal:  <150 mg/dLBorderline High:  150 - 199 mg/dL  . HDL 02/03/2019 26.20* >39.00 mg/dL Final  . VLDL 02/03/2019 35.6  0.0 - 40.0 mg/dL Final  . LDL Cholesterol 02/03/2019 50  0 - 99 mg/dL Final  . Total CHOL/HDL Ratio 02/03/2019 4   Final                  Men          Women1/2 Average Risk     3.4          3.3Average Risk          5.0          4.42X Average Risk          9.6          7.13X Average Risk          15.0          11.0                      . NonHDL 02/03/2019 85.50   Final   NOTE:  Non-HDL goal should be 30 mg/dL higher than patient's LDL goal (i.e. LDL goal of < 70 mg/dL, would have non-HDL goal of < 100 mg/dL)    Physical Examination:  There were no vitals taken for this visit.   ASSESSMENT:  Diabetes type 2, mildly obese  See history of present illness for detailed discussion of his current management, blood sugar patterns and problems identified  A1c is previously higher at 7.6 and has been as low as 6.7  Fructosamine recently is 262 indicating fair control  She is on basal bolus insulin along with Invokana 100 mg she does not have any consistent high postprandial readings recently from her meter download However fasting blood sugars are relatively high, averaging 143 recently  She can do better with some weight loss efforts Currently not doing any exercise   LIPIDS: Much improved including triglycerides and will  continue same regimen     PLAN:   She will try to do an indoor exercise program with some form of video or walking in place Increase Lantus to 40 instead of 38 No change in Invokana Continue checking  blood pressure at home  Follow-up in 3 months for A1c again  Total visit time for today's visit = 9 minutes  There are no Patient Instructions on file for this visit.  [name] 02/07/2019, 9:01 PM   Note: This office note was prepared with Dragon voice recognition system technology. Any transcriptional errors that result from this process are unintentional.

## 2019-02-08 ENCOUNTER — Other Ambulatory Visit: Payer: Self-pay

## 2019-02-08 ENCOUNTER — Ambulatory Visit (INDEPENDENT_AMBULATORY_CARE_PROVIDER_SITE_OTHER): Payer: PPO | Admitting: Endocrinology

## 2019-02-08 ENCOUNTER — Encounter: Payer: Self-pay | Admitting: Endocrinology

## 2019-02-08 DIAGNOSIS — E782 Mixed hyperlipidemia: Secondary | ICD-10-CM | POA: Diagnosis not present

## 2019-02-08 DIAGNOSIS — I10 Essential (primary) hypertension: Secondary | ICD-10-CM | POA: Diagnosis not present

## 2019-02-08 DIAGNOSIS — Z794 Long term (current) use of insulin: Secondary | ICD-10-CM | POA: Diagnosis not present

## 2019-02-08 DIAGNOSIS — E1165 Type 2 diabetes mellitus with hyperglycemia: Secondary | ICD-10-CM

## 2019-02-23 ENCOUNTER — Other Ambulatory Visit: Payer: Self-pay | Admitting: Endocrinology

## 2019-02-25 ENCOUNTER — Other Ambulatory Visit: Payer: Self-pay | Admitting: Endocrinology

## 2019-03-04 DIAGNOSIS — E119 Type 2 diabetes mellitus without complications: Secondary | ICD-10-CM | POA: Diagnosis not present

## 2019-03-04 DIAGNOSIS — H2513 Age-related nuclear cataract, bilateral: Secondary | ICD-10-CM | POA: Diagnosis not present

## 2019-03-04 DIAGNOSIS — H43813 Vitreous degeneration, bilateral: Secondary | ICD-10-CM | POA: Diagnosis not present

## 2019-03-04 LAB — HM DIABETES EYE EXAM

## 2019-03-09 ENCOUNTER — Other Ambulatory Visit: Payer: Self-pay | Admitting: Endocrinology

## 2019-03-24 DIAGNOSIS — H2511 Age-related nuclear cataract, right eye: Secondary | ICD-10-CM | POA: Diagnosis not present

## 2019-04-12 ENCOUNTER — Other Ambulatory Visit (INDEPENDENT_AMBULATORY_CARE_PROVIDER_SITE_OTHER): Payer: PPO

## 2019-04-12 ENCOUNTER — Other Ambulatory Visit: Payer: Self-pay

## 2019-04-12 DIAGNOSIS — E1165 Type 2 diabetes mellitus with hyperglycemia: Secondary | ICD-10-CM | POA: Diagnosis not present

## 2019-04-12 DIAGNOSIS — E782 Mixed hyperlipidemia: Secondary | ICD-10-CM | POA: Diagnosis not present

## 2019-04-12 DIAGNOSIS — Z794 Long term (current) use of insulin: Secondary | ICD-10-CM | POA: Diagnosis not present

## 2019-04-12 LAB — MICROALBUMIN / CREATININE URINE RATIO
Creatinine,U: 112.2 mg/dL
Microalb Creat Ratio: 32.3 mg/g — ABNORMAL HIGH (ref 0.0–30.0)
Microalb, Ur: 36.2 mg/dL — ABNORMAL HIGH (ref 0.0–1.9)

## 2019-04-12 LAB — COMPREHENSIVE METABOLIC PANEL
ALT: 23 U/L (ref 0–35)
AST: 20 U/L (ref 0–37)
Albumin: 4.4 g/dL (ref 3.5–5.2)
Alkaline Phosphatase: 94 U/L (ref 39–117)
BUN: 15 mg/dL (ref 6–23)
CO2: 29 mEq/L (ref 19–32)
Calcium: 10.1 mg/dL (ref 8.4–10.5)
Chloride: 103 mEq/L (ref 96–112)
Creatinine, Ser: 0.64 mg/dL (ref 0.40–1.20)
GFR: 92.47 mL/min (ref >60.00)
Glucose, Bld: 146 mg/dL — ABNORMAL HIGH (ref 70–99)
Potassium: 4.3 mEq/L (ref 3.5–5.1)
Sodium: 138 mEq/L (ref 135–145)
Total Bilirubin: 0.6 mg/dL (ref 0.2–1.2)
Total Protein: 7.4 g/dL (ref 6.0–8.3)

## 2019-04-12 LAB — HEMOGLOBIN A1C: Hgb A1c MFr Bld: 7.5 % — ABNORMAL HIGH (ref 4.6–6.5)

## 2019-04-14 ENCOUNTER — Other Ambulatory Visit: Payer: Self-pay

## 2019-04-14 LAB — TSH: TSH: 5.99 u[IU]/mL — ABNORMAL HIGH (ref 0.35–4.50)

## 2019-04-17 NOTE — Progress Notes (Signed)
Patient ID: Kimberly [name], female   DOB: [birth year], 68 y.o.   MRN: [id number]           Reason for Appointment: Endocrinology follow-up     History of Present Illness:          Date of diagnosis of type 2 diabetes mellitus: 1995        Background history:   For several years she had been tried on oral agents with variable control.  Detailed history not available She thinks that she had nausea and vomiting with metformin and could not take much Also had been tried on Amaryl  She thinks she had swelling with Januvia and not clear what other medications she has tried before  She was started on insulin in 07/2013 when her A1c was over 11% Because of her poor control and A1c of 9.1 she was started on Invokana 100 mg daily in 12/16  Recent history:   INSULIN regimen is: 40 Lantus hs   Novolog 15  units before lunch. Novolog 10 acs    Her A1c is slightly higher at 7.5 compared to 7.6, has been as low as 6.7   Current blood sugar patterns, management and problems identified:  She is reporting back after 2 months after adjustments of her insulin and her concern about blood sugar being higher when she had eye surgery  She went up to 20 units instead of 15 on her NovoLog  Since her sugars are higher in the afternoon over 200 at times and they are all the rarely higher  She has gained weight and may be from decreased activity  Blood sugars fluctuate based on her diet  Usually is eating a low carbohydrate meal in the evening with a lean meat and beans although yesterday had cereal  Blood sugars after evening meal are relatively lower but no hypoglycemia  Fasting blood sugars are slightly better overall with increasing Lantus by 2 units on her last visit  Renal function unchanged with Invokana  She has not done any exercise despite reminders and mostly only Walking indoors  Non-insulin hypoglycemic drugs the patient is taking are:  Invokana 100 mg daily      Side effects from  medications have been: Nausea from metformin,?  Swelling from Januvia  Compliance with the medical regimen: Improving now  Glucose monitoring:  done 1-2 times a day         Glucometer:  Contour       Blood Glucose readings   PRE-MEAL Fasting Lunch Dinner Bedtime Overall  Glucose range:  117-161      Mean/median:  136    137   POST-MEAL PC Breakfast PC Lunch PC Dinner  Glucose range:   109-215  93-135  Mean/median:   150  119   Previous readings:  PRE-MEAL Fasting Lunch Dinner Bedtime Overall  Glucose range:  117-173      Mean/median:  143     143   POST-MEAL PC Breakfast PC Lunch PC Dinner  Glucose range:   113-177  97-175  Mean/median:   143  143             Self-care: The diet that the patient has been following is: tries to limit fats, breads.     Meal times are:  Lunch: 12 pm Dinner: 5-7 PM   Typical meal intake: Breakfast is oatmeal or grits             Dietician visit, most recent: 09/2015  Weight history:  Wt Readings from Last 3 Encounters:  04/18/19 161 lb 12.8 oz (73.4 kg)  08/09/18 157 lb 3.2 oz (71.3 kg)  05/24/18 157 lb 6.4 oz (71.4 kg)    Glycemic control:   Lab Results  Component Value Date   HGBA1C 7.5 (H) 04/12/2019   HGBA1C 7.2 (H) 11/24/2018   HGBA1C 7.6 (H) 09/20/2018   Lab Results  Component Value Date   MICROALBUR 36.2 (H) 04/12/2019   LDLCALC 50 02/03/2019   CREATININE 0.64 04/12/2019       Allergies as of 04/18/2019      Reactions   Metformin And Related Nausea And Vomiting   After one pill at hospital   Januvia [sitagliptin Phosphate] Other (See Comments)   Swelling and edema see 4 13 OV      Medication List       Accurate as of April 18, 2019  8:22 AM. If you have any questions, ask your nurse or doctor.        albuterol 108 (90 Base) MCG/ACT inhaler Commonly known as: VENTOLIN HFA Inhale 2 puffs into the lungs every 6 (six) hours as needed. For shortness of breath or wheezing   aspirin 325 MG EC  tablet Take 325 mg by mouth every morning.   atorvastatin 80 MG tablet Commonly known as: LIPITOR TAKE 1 TABLET BY MOUTH EVERY DAY   BD Pen Needle Nano U/F 32G X 4 MM Misc Generic drug: Insulin Pen Needle Use as instructed to inject 3 insulin 3 times daily.   ezetimibe 10 MG tablet Commonly known as: Zetia Take 1 tablet (10 mg total) by mouth daily.   glucose blood test strip 1 each by Other route as needed for other. Use as instructed to check blood sugar 4 times daily. DX:E11.9   Invokana 100 MG Tabs tablet Generic drug: canagliflozin TAKE 1 TABLET BY MOUTH EVERY DAY BEFORE BREAKFAST   Lantus SoloStar 100 UNIT/ML Solostar Pen Generic drug: Insulin Glargine INJECT 40 UNITS UNDER THE SKIN DAILY   lisinopril-hydrochlorothiazide 20-12.5 MG tablet Commonly known as: ZESTORETIC Take 0.5 tablets by mouth daily. Increase to 1 po qd as directed   NovoLOG FlexPen 100 UNIT/ML FlexPen Generic drug: insulin aspart INJECT 18 UNITS UNDER THE SKIN AT LARGEST MEAL AND 10 UNITS AT [facility] Verio w/Device Kit 1 each by Does not apply route daily. Use blood sugar meter to check blood sugar. DX:E11.9       Allergies:  Allergies  Allergen Reactions  . Metformin And Related Nausea And Vomiting    After one pill at hospital  . Januvia [Sitagliptin Phosphate] Other (See Comments)    Swelling and edema see 4 13 OV    Past Medical History:  Diagnosis Date  . CAD (coronary artery disease)   . Colitis   . Colitis, indeterminate 01/31/2012  . COPD (chronic obstructive pulmonary disease) ([place])   . Depression 05/16/2017  . Depression with anxiety 05/30/2016  . Diverticulitis 02/10/2012  . DM (diabetes mellitus) ([place])   . Glaucoma   . Glaucoma   . History of ventricular fibrillation   . HTN (hypertension)   . Myocardial infarction ([place])    12/1998, 01/2011  . Osteoporosis   . PVD (peripheral vascular disease) ([place])     Past Surgical History:  Procedure Laterality Date  .  ABDOMINAL HYSTERECTOMY     endometriosis  . CABG x 3  01/22/2011   Dr Cyndia Bent  . CARDIAC CATHETERIZATION  01/17/2011   Dr  Beins  . LIPOMA EXCISION Left 02/08/2015   Procedure: EXCISION OF LEFT THIGH LIPOMA;  Surgeon: Greer Pickerel, MD;  Location: [place];  Service: General;  Laterality: Left;  . S/P aortobifemoral bypass  2008   Dr Donnetta Hutching  . S/P myocardial infarction and PCI of the lt circumflex  2000    Family History  Problem Relation Age of Onset  . Heart attack Father   . Diabetes Father   . Heart disease Father   . Diabetes Brother   . Diabetes Mother   . Liver cancer Mother   . Hypertension Unknown   . Thyroid disease Unknown        sisters- sister had parathyroidectomy  . Alcohol abuse Unknown        brother  . Colon polyps Maternal Uncle   . Breast cancer Sister   . Diabetes Sister     Social History:  reports that she has been smoking cigarettes. She has been smoking about 0.40 packs per day. She has never used smokeless tobacco. She reports that she does not drink alcohol or use drugs.    Review of Systems    Lipid history: On 80 mg Lipitor with history of CAD Prescribed by PCP  Her LDL is last improved along with triglycerides  Lab Results  Component Value Date   CHOL 112 02/03/2019   CHOL 102 11/24/2018   CHOL 147 05/20/2018   Lab Results  Component Value Date   HDL 26.20 (L) 02/03/2019   HDL 22.20 (L) 11/24/2018   HDL 28.70 (L) 05/20/2018   Lab Results  Component Value Date   LDLCALC 50 02/03/2019   LDLCALC 87 05/20/2018   LDLCALC 70 06/22/2017   Lab Results  Component Value Date   TRIG 178.0 (H) 02/03/2019   TRIG 303.0 (H) 11/24/2018   TRIG 156.0 (H) 05/20/2018   Lab Results  Component Value Date   CHOLHDL 4 02/03/2019   CHOLHDL 5 11/24/2018   CHOLHDL 5 05/20/2018   Lab Results  Component Value Date   LDLDIRECT 54.0 11/24/2018   LDLDIRECT 87.0 04/24/2015   LDLDIRECT 150.3 07/19/2013            Hypertension: Present  for several years Zestoretic  was reduced to half a tablet with starting Invokana previously Blood pressure at home recently 120/80   BP Readings from Last 3 Encounters:  04/18/19 (!) 142/70  08/09/18 134/70  05/24/18 128/70    HYPERCALCEMIA: She has had periodic mild hypercalcemia since about 2010; PTH levels in the mid-upper range Calcium is upper normal and stable  Lab Results  Component Value Date   CALCIUM 10.1 04/12/2019    She is taking 1000 units of vitamin D with adequate levels  No history of fractures  Her last bone density was in 2/19 and this showed T score of -1.2 indicating mild osteopenia at the hip, her spine also shows a relative decline  She was tried on Evista but she says it was causing her hot flashes and night sweats and she stopped taking this  Previously in 07/2015 T score -2.3 was at the wrist, this was not measured in 2019  Most recent eye exam was normal in 9/29 and she does go every year  Most recent foot exam: 07/2018 by PCP  HYPOTHYROIDISM:  She has a family history of hypothyroidism, Graves' disease and thyroid nodules At times especially in the last week or so she has been feeling more tired Also tends to get a  little more cold Has had some weight gain this year  She has not been on thyroid supplements in the past and no history of goiter  Lab Results  Component Value Date   TSH 5.99 (H) 04/12/2019   TSH 4.13 07/17/2015   TSH 3.30 04/24/2015   FREET4 0.99 10/01/2011   FREET4 1.0 07/24/2009   FREET4 1.0 01/30/2009     Review of Systems    LABS:  Lab on 04/12/2019  Component Date Value Ref Range Status  . TSH 04/12/2019 5.99* 0.35 - 4.50 uIU/mL Final  . Microalb, Ur 04/12/2019 36.2* 0.0 - 1.9 mg/dL Final  . Creatinine,U 04/12/2019 112.2  mg/dL Final  . Microalb Creat Ratio 04/12/2019 32.3* 0.0 - 30.0 mg/g Final  . Sodium 04/12/2019 138  135 - 145 mEq/L Final  . Potassium 04/12/2019 4.3  3.5 - 5.1 mEq/L Final  . Chloride  04/12/2019 103  96 - 112 mEq/L Final  . CO2 04/12/2019 29  19 - 32 mEq/L Final  . Glucose, Bld 04/12/2019 146* 70 - 99 mg/dL Final  . BUN 04/12/2019 15  6 - 23 mg/dL Final  . Creatinine, Ser 04/12/2019 0.64  0.40 - 1.20 mg/dL Final  . Total Bilirubin 04/12/2019 0.6  0.2 - 1.2 mg/dL Final  . Alkaline Phosphatase 04/12/2019 94  39 - 117 U/L Final  . AST 04/12/2019 20  0 - 37 U/L Final  . ALT 04/12/2019 23  0 - 35 U/L Final  . Total Protein 04/12/2019 7.4  6.0 - 8.3 g/dL Final  . Albumin 04/12/2019 4.4  3.5 - 5.2 g/dL Final  . Calcium 04/12/2019 10.1  8.4 - 10.5 mg/dL Final  . GFR 04/12/2019 92.47  >60.00 mL/min Final  . Hgb A1c MFr Bld 04/12/2019 7.5* 4.6 - 6.5 % Final   Glycemic Control Guidelines for People with Diabetes:Non Diabetic:  <6%Goal of Therapy: <7%Additional Action Suggested:  >8%     Physical Examination:  BP (!) 142/70 (BP Location: Left Arm, Patient Position: Sitting, Cuff Size: Normal)   Pulse 88   Ht _0  (1.6 m)   Wt 161 lb 12.8 oz (73.4 kg)   SpO2 97%   BMI 28.66 kg/m   No swelling of the eyes No ankle edema She is looking well Thyroid not enlarged  ASSESSMENT:  Diabetes type 2, mildly obese  See history of present illness for detailed discussion of his current management, blood sugar patterns and problems identified  A1c is 7.5 and slightly higher  She is on basal bolus insulin along with Invokana 100 mg  She requires relatively larger doses of mealtime insulin at lunch compared to dinner Also basal insulin has been adjusted by 2 units on the last visit She apparently had taken additional doses of NovoLog after her eye surgery when she had steroid drops which appeared to have raised her sugars  She has gained weight and is not active However her diet is usually fairly good and at suppertime she is usually eating a low carbohydrate meals with consistent protein Only occasionally has high readings after lunch  HYPOTHYROIDISM: This appears to be a  new diagnosis, she does have some nonspecific fatigue recently and cold intolerance With her strong family history of hypothyroidism and Graves' disease likely she has Hashimoto's thyroiditis without a goiter  Hypertension and hyperlipidemia followed by PCP  Influenza vaccine given and patient handout given on the vaccine information  PLAN:   Discussed importance and need for weight loss  She will try to  do more indoor walking and on pleasant days go outside to walk regularly  She will not change her insulin at lunchtime as yet and same basal dose of 40 units However she can likely get by with 8 units of NovoLog instead of 10 at suppertime because of the usually low carbohydrate intake and blood sugars averaging below 120 at night   No change in Invokana at this time  Trial of LEVOTHYROXINE 25 mcg daily Discussed hypothyroidism, autoimmune thyroid disease, timing of taking levothyroxine and interacting substances Discussed that if she has no subjective improvement may not continue this and continue to watch her TSH  MICROALBUMINURIA: This is borderline and will continue to follow, hypertension followed by PCP  Follow-up in 3 months for A1c again  Total visit time for evaluation and management of multiple problems and counseling =25 minutes  There are no Patient Instructions on file for this visit.  [name] 04/18/2019, 8:22 AM   Note: This office note was prepared with Dragon voice recognition system technology. Any transcriptional errors that result from this process are unintentional.

## 2019-04-18 ENCOUNTER — Encounter: Payer: Self-pay | Admitting: Endocrinology

## 2019-04-18 ENCOUNTER — Ambulatory Visit: Payer: PPO | Admitting: Endocrinology

## 2019-04-18 ENCOUNTER — Other Ambulatory Visit: Payer: Self-pay

## 2019-04-18 VITALS — BP 142/70 | HR 88 | Ht 63.0 in | Wt 161.8 lb

## 2019-04-18 DIAGNOSIS — Z794 Long term (current) use of insulin: Secondary | ICD-10-CM

## 2019-04-18 DIAGNOSIS — E1165 Type 2 diabetes mellitus with hyperglycemia: Secondary | ICD-10-CM

## 2019-04-18 DIAGNOSIS — Z23 Encounter for immunization: Secondary | ICD-10-CM | POA: Diagnosis not present

## 2019-04-18 DIAGNOSIS — E063 Autoimmune thyroiditis: Secondary | ICD-10-CM | POA: Diagnosis not present

## 2019-04-18 DIAGNOSIS — R809 Proteinuria, unspecified: Secondary | ICD-10-CM | POA: Diagnosis not present

## 2019-04-18 DIAGNOSIS — E1129 Type 2 diabetes mellitus with other diabetic kidney complication: Secondary | ICD-10-CM | POA: Diagnosis not present

## 2019-04-18 MED ORDER — LEVOTHYROXINE SODIUM 25 MCG PO TABS: 25.0000 ug | ORAL_TABLET | Freq: Every day | ORAL | 3 refills | Status: DC

## 2019-04-18 NOTE — Patient Instructions (Signed)
Supper dose 8 Novolog 10 min before the meal

## 2019-05-28 ENCOUNTER — Other Ambulatory Visit: Payer: Self-pay | Admitting: Endocrinology

## 2019-06-04 ENCOUNTER — Other Ambulatory Visit: Payer: Self-pay | Admitting: Internal Medicine

## 2019-06-23 ENCOUNTER — Other Ambulatory Visit: Payer: Self-pay | Admitting: Endocrinology

## 2019-06-23 ENCOUNTER — Other Ambulatory Visit: Payer: Self-pay

## 2019-06-23 MED ORDER — GLUCOSE BLOOD VI STRP: ORAL_STRIP | 2 refills | Status: DC

## 2019-06-23 MED ORDER — ACCU-CHEK GUIDE W/DEVICE KIT: 1.0000 | PACK | Freq: Four times a day (QID) | 0 refills | Status: DC

## 2019-06-23 MED ORDER — ACCU-CHEK FASTCLIX LANCETS MISC: 2 refills | Status: AC

## 2019-07-11 ENCOUNTER — Other Ambulatory Visit: Payer: Self-pay | Admitting: Endocrinology

## 2019-07-19 ENCOUNTER — Other Ambulatory Visit: Payer: Self-pay

## 2019-07-19 ENCOUNTER — Other Ambulatory Visit: Payer: Medicare HMO

## 2019-07-19 DIAGNOSIS — Z794 Long term (current) use of insulin: Secondary | ICD-10-CM

## 2019-07-19 DIAGNOSIS — E063 Autoimmune thyroiditis: Secondary | ICD-10-CM | POA: Diagnosis not present

## 2019-07-19 DIAGNOSIS — E1165 Type 2 diabetes mellitus with hyperglycemia: Secondary | ICD-10-CM | POA: Diagnosis not present

## 2019-07-19 LAB — BASIC METABOLIC PANEL
BUN: 18 mg/dL (ref 6–23)
CO2: 27 mEq/L (ref 19–32)
Calcium: 10.1 mg/dL (ref 8.4–10.5)
Chloride: 100 mEq/L (ref 96–112)
Creatinine, Ser: 0.78 mg/dL (ref 0.40–1.20)
GFR: 73.54 mL/min (ref >60.00)
Glucose, Bld: 162 mg/dL — ABNORMAL HIGH (ref 70–99)
Potassium: 4.3 mEq/L (ref 3.5–5.1)
Sodium: 137 mEq/L (ref 135–145)

## 2019-07-19 LAB — HEMOGLOBIN A1C: Hgb A1c MFr Bld: 7.9 % — ABNORMAL HIGH (ref 4.6–6.5)

## 2019-07-19 LAB — TSH: TSH: 4.52 u[IU]/mL — ABNORMAL HIGH (ref 0.35–4.50)

## 2019-07-19 LAB — T4, FREE: Free T4: 0.89 ng/dL (ref 0.60–1.60)

## 2019-07-25 ENCOUNTER — Ambulatory Visit (INDEPENDENT_AMBULATORY_CARE_PROVIDER_SITE_OTHER): Payer: Medicare HMO | Admitting: Endocrinology

## 2019-07-25 ENCOUNTER — Other Ambulatory Visit: Payer: Self-pay

## 2019-07-25 ENCOUNTER — Encounter: Payer: Self-pay | Admitting: Endocrinology

## 2019-07-25 VITALS — BP 152/80 | HR 84 | Ht 63.0 in | Wt 163.8 lb

## 2019-07-25 DIAGNOSIS — E063 Autoimmune thyroiditis: Secondary | ICD-10-CM | POA: Diagnosis not present

## 2019-07-25 DIAGNOSIS — I10 Essential (primary) hypertension: Secondary | ICD-10-CM

## 2019-07-25 DIAGNOSIS — Z794 Long term (current) use of insulin: Secondary | ICD-10-CM

## 2019-07-25 DIAGNOSIS — E1165 Type 2 diabetes mellitus with hyperglycemia: Secondary | ICD-10-CM

## 2019-07-25 MED ORDER — CANAGLIFLOZIN 300 MG PO TABS: 300.0000 mg | ORAL_TABLET | Freq: Every day | ORAL | 3 refills | Status: DC

## 2019-07-25 MED ORDER — LEVOTHYROXINE SODIUM 50 MCG PO TABS: 50.0000 ug | ORAL_TABLET | Freq: Every day | ORAL | 3 refills | Status: DC

## 2019-07-25 NOTE — Patient Instructions (Addendum)
Check blood sugars on waking up 3-4 days a week  Also check blood sugars about 2 hours after meals and do this after different meals by rotation  Recommended blood sugar levels on waking up are 90-130 and about 2 hours after meal is 130-170  Please bring your blood sugar monitor to each visit, thank you  Invokana 2 daily  Take 2 thyroid pills in am of 25 dose  INDOOR EXERCISE IDEAS   Use the following examples for a creative indoor workout (perform each move for 2-3 minutes):   Warm up. Put on some music that makes you feel like moving, and dance around the living room.  Watch exercise shows on TV and move along with them. There are tons of free cable channels that have daily exercise shows on them for all levels - beginner through advanced.   You can easily find a number of exercise videos but use one that will suit your liking and exercise level; you can do these on your own schedule.  Walk up and down the steps.  Do dumbbell curls and presses (if you don't have weights, use full water bottles).  Do assisted squats, keeping your back on a fitness ball against the [place] or using the back of the couch for support.  Shadow box: Lift and lower the left leg; jab with the right arm, then the left; then lift and lower the right leg.  Fence (you don't even need swords). Pretend you're holding a sword in each hand. Create an X pattern standing still, then moving forward and back.  Hop on your exercise bike or treadmill -- or, for something different, use a weighted hula hoop. If you don't have any of those, just go back to dancing.   Cool down with [employer] "I Feel Good" -- or whatever tune makes you feel good

## 2019-07-25 NOTE — Progress Notes (Signed)
Patient ID: Kimberly [name], female   DOB: Feb 16, 1951, 69 y.o.   MRN: [id number]           Reason for Appointment: Endocrinology follow-up     History of Present Illness:          Date of diagnosis of type 2 diabetes mellitus: 1995        Background history:   For several years she had been tried on oral agents with variable control.  Detailed history not available She thinks that she had nausea and vomiting with metformin and could not take much Also had been tried on Amaryl  She thinks she had swelling with Januvia and not clear what other medications she has tried before  She was started on insulin in 07/2013 when her A1c was over 11% Because of her poor control and A1c of 9.1 she was started on Invokana 100 mg daily in 12/16  Recent history:   INSULIN regimen is: 40 Lantus hs   Novolog 15  units before lunch. Novolog 10 acs    Her A1c is again higher at 7.9, previously has been as low as 6.7   Current blood sugar patterns, management and problems identified:  She is having higher blood sugars in the mornings mostly even though evening readings are not consistently high  She also continues to gain weight gradually  She is complaining of feeling depressed and not being able to move control her portions or types of meals  Also not able to do much exercise indoors  Blood sugars are being checked about 1-2 times a day and not on a daily basis  Blood sugars fluctuate based on her diet but more than usual  Usually is eating a low carbohydrate meal in the evening with a lean meat and beans although yesterday had cereal  Blood sugars after evening meal are relatively lower but no hypoglycemia  Fasting blood sugars are slightly better overall with increasing Lantus by 2 units on her last visit  Renal function unchanged with Invokana, tolerating this well    Non-insulin hypoglycemic drugs the patient is taking are:  Invokana 100 mg daily      Side effects from medications  have been: Nausea from metformin,?  Swelling from Januvia  Glucose monitoring:  done 1-2 times a day         Glucometer:  Contour       Blood Glucose readings   PRE-MEAL Fasting Lunch Dinner Bedtime Overall  Glucose range:  110-170      Mean/median:  155     145   POST-MEAL PC Breakfast PC Lunch PC Dinner  Glucose range:   100-187  99-212  Mean/median:   130  151   Previous readings:  PRE-MEAL Fasting Lunch Dinner Bedtime Overall  Glucose range:  117-161      Mean/median:  136    137   POST-MEAL PC Breakfast PC Lunch PC Dinner  Glucose range:   109-215  93-135  Mean/median:   150  119     Self-care: The diet that the patient has been following is: tries to limit fats, breads.     Meal times are:  Lunch: 12 pm Dinner: 5-7 PM   Typical meal intake: Breakfast is oatmeal or grits             Dietician visit, most recent: 09/2015                Weight history:  Wt Readings from Last  3 Encounters:  07/25/19 163 lb 12.8 oz (74.3 kg)  04/18/19 161 lb 12.8 oz (73.4 kg)  08/09/18 157 lb 3.2 oz (71.3 kg)    Glycemic control:   Lab Results  Component Value Date   HGBA1C 7.9 (H) 07/19/2019   HGBA1C 7.5 (H) 04/12/2019   HGBA1C 7.2 (H) 11/24/2018   Lab Results  Component Value Date   MICROALBUR 36.2 (H) 04/12/2019   LDLCALC 50 02/03/2019   CREATININE 0.78 07/19/2019       Allergies as of 07/25/2019      Reactions   Metformin And Related Nausea And Vomiting   After one pill at hospital   Januvia [sitagliptin Phosphate] Other (See Comments)   Swelling and edema see 4 13 OV      Medication List       Accurate as of July 25, 2019 10:02 AM. If you have any questions, ask your nurse or doctor.        Accu-Chek FastClix Lancets Misc Use as instructed to check blood sugar 4 times daily.   Accu-Chek Guide w/Device Kit 1 each by Does not apply route 4 (four) times daily. Use Accu Chek to check blood sugar 4 times daily.   albuterol 108 (90 Base) MCG/ACT  inhaler Commonly known as: VENTOLIN HFA Inhale 2 puffs into the lungs every 6 (six) hours as needed. For shortness of breath or wheezing   aspirin 325 MG EC tablet Take 325 mg by mouth every morning.   atorvastatin 80 MG tablet Commonly known as: LIPITOR TAKE 1 TABLET BY MOUTH EVERY DAY   BD Pen Needle Nano U/F 32G X 4 MM Misc Generic drug: Insulin Pen Needle Use as instructed to inject 3 insulin 3 times daily.   ezetimibe 10 MG tablet Commonly known as: Zetia Take 1 tablet (10 mg total) by mouth daily.   glucose blood test strip Use Accu Check Guide test strips as instructed to check blood sugar 4 times daily.   Invokana 100 MG Tabs tablet Generic drug: canagliflozin TAKE 1 TABLET BY MOUTH EVERY DAY BEFORE BREAKFAST   Lantus SoloStar 100 UNIT/ML Solostar Pen Generic drug: Insulin Glargine INJECT 40 UNITS UNDER THE SKIN DAILY   levothyroxine 25 MCG tablet Commonly known as: Synthroid Take 1 tablet (25 mcg total) by mouth daily before breakfast.   lisinopril-hydrochlorothiazide 20-12.5 MG tablet Commonly known as: ZESTORETIC Take 0.5 tablets by mouth daily. Increase to 1 po qd as directed   NovoLOG FlexPen 100 UNIT/ML FlexPen Generic drug: insulin aspart INJECT 18 UNITS UNDER THE SKIN AT LARGEST MEAL AND 10 UNITS AT DINNER       Allergies:  Allergies  Allergen Reactions  . Metformin And Related Nausea And Vomiting    After one pill at hospital  . Januvia [Sitagliptin Phosphate] Other (See Comments)    Swelling and edema see 4 13 OV    Past Medical History:  Diagnosis Date  . CAD (coronary artery disease)   . Colitis   . Colitis, indeterminate 01/31/2012  . COPD (chronic obstructive pulmonary disease) ([place])   . Depression 05/16/2017  . Depression with anxiety 05/30/2016  . Diverticulitis 02/10/2012  . DM (diabetes mellitus) ([place])   . Glaucoma   . Glaucoma   . History of ventricular fibrillation   . HTN (hypertension)   . Myocardial infarction ([place])     12/1998, 01/2011  . Osteoporosis   . PVD (peripheral vascular disease) ([place])     Past Surgical History:  Procedure Laterality Date  .  ABDOMINAL HYSTERECTOMY     endometriosis  . CABG x 3  01/22/2011   Dr Cyndia Bent  . CARDIAC CATHETERIZATION  01/17/2011   Dr Arvella Merles  . LIPOMA EXCISION Left 02/08/2015   Procedure: EXCISION OF LEFT THIGH LIPOMA;  Surgeon: Greer Pickerel, MD;  Location: [facility];  Service: General;  Laterality: Left;  . S/P aortobifemoral bypass  2008   Dr Donnetta Hutching  . S/P myocardial infarction and PCI of the lt circumflex  2000    Family History  Problem Relation Age of Onset  . Heart attack Father   . Diabetes Father   . Heart disease Father   . Diabetes Brother   . Diabetes Mother   . Liver cancer Mother   . Hypertension Other   . Thyroid disease Other        sisters- sister had parathyroidectomy  . Alcohol abuse Other        brother  . Colon polyps Maternal Uncle   . Breast cancer Sister   . Diabetes Sister   . Thyroid disease Sister     Social History:  reports that she has been smoking cigarettes. She has been smoking about 0.40 packs per day. She has never used smokeless tobacco. She reports that she does not drink alcohol or use drugs.    Review of Systems    Lipid history: On 80 mg Lipitor with history of CAD Prescribed by PCP  Her LDL is last improved along with triglycerides  Lab Results  Component Value Date   CHOL 112 02/03/2019   CHOL 102 11/24/2018   CHOL 147 05/20/2018   Lab Results  Component Value Date   HDL 26.20 (L) 02/03/2019   HDL 22.20 (L) 11/24/2018   HDL 28.70 (L) 05/20/2018   Lab Results  Component Value Date   LDLCALC 50 02/03/2019   LDLCALC 87 05/20/2018   LDLCALC 70 06/22/2017   Lab Results  Component Value Date   TRIG 178.0 (H) 02/03/2019   TRIG 303.0 (H) 11/24/2018   TRIG 156.0 (H) 05/20/2018   Lab Results  Component Value Date   CHOLHDL 4 02/03/2019   CHOLHDL 5 11/24/2018   CHOLHDL 5 05/20/2018    Lab Results  Component Value Date   LDLDIRECT 54.0 11/24/2018   LDLDIRECT 87.0 04/24/2015   LDLDIRECT 150.3 07/19/2013            Hypertension: Present for several years Zestoretic  was reduced to half a tablet with starting Invokana previously Blood pressure at home recently 120/80   BP Readings from Last 3 Encounters:  07/25/19 (!) 152/80  04/18/19 (!) 142/70  08/09/18 134/70    HYPERCALCEMIA: She has had periodic mild hypercalcemia since about 2010; PTH levels in the mid-upper range Calcium is upper normal and stable  Lab Results  Component Value Date   CALCIUM 10.1 07/19/2019    She is taking 1000 units of vitamin D with adequate vitamin D levels  No history of fractures  Her last bone density was in 2/19 and this showed T score of -1.2 indicating mild osteopenia at the hip, her spine also shows a relative decline  She was tried on Evista but she says it was causing her hot flashes and night sweats and she stopped taking this  Previously in 07/2015 T score -2.3 was at the wrist, this was not measured in 2019  Most recent eye exam was normal in 9/29 and she does go every year  Most recent foot exam: 07/2018 by  PCP  HYPOTHYROIDISM:  She has a family history of hypothyroidism, Graves' disease and thyroid nodules  On her visit in 11/20 she was complaining of feeling more tired as well as colder Has had some weight gain in the last few months gradually  She has not been on thyroid supplements in the past and no history of goiter on exams  With levothyroxine 25 mcg daily she has not felt any better although she is having some depression issues Has taken this in the morning regularly Her TSH is relatively better but still not normal with baseline of 6  Lab Results  Component Value Date   TSH 4.52 (H) 07/19/2019   TSH 5.99 (H) 04/12/2019   TSH 4.13 07/17/2015   FREET4 0.89 07/19/2019   FREET4 0.99 10/01/2011   FREET4 1.0 07/24/2009     Review of  Systems    LABS:  Lab on 07/19/2019  Component Date Value Ref Range Status  . Free T4 07/19/2019 0.89  0.60 - 1.60 ng/dL Final   Comment: Specimens from patients who are undergoing biotin therapy and /or ingesting biotin supplements may contain high levels of biotin.  The higher biotin concentration in these specimens interferes with this Free T4 assay.  Specimens that contain high levels  of biotin may cause false high results for this Free T4 assay.  Please interpret results in light of the total clinical presentation of the patient.    Marland Kitchen TSH 07/19/2019 4.52* 0.35 - 4.50 uIU/mL Final  . Sodium 07/19/2019 137  135 - 145 mEq/L Final  . Potassium 07/19/2019 4.3  3.5 - 5.1 mEq/L Final  . Chloride 07/19/2019 100  96 - 112 mEq/L Final  . CO2 07/19/2019 27  19 - 32 mEq/L Final  . Glucose, Bld 07/19/2019 162* 70 - 99 mg/dL Final  . BUN 07/19/2019 18  6 - 23 mg/dL Final  . Creatinine, Ser 07/19/2019 0.78  0.40 - 1.20 mg/dL Final  . GFR 07/19/2019 73.54  >60.00 mL/min Final  . Calcium 07/19/2019 10.1  8.4 - 10.5 mg/dL Final  . Hgb A1c MFr Bld 07/19/2019 7.9* 4.6 - 6.5 % Final   Glycemic Control Guidelines for People with Diabetes:Non Diabetic:  <6%Goal of Therapy: <7%Additional Action Suggested:  >8%     Physical Examination:  BP (!) 152/80 (BP Location: Left Arm, Patient Position: Sitting, Cuff Size: Normal)   Pulse 84   Ht _0  (1.6 m)   Wt 163 lb 12.8 oz (74.3 kg)   SpO2 96%   BMI 29.02 kg/m     ASSESSMENT:  Diabetes type 2, mildly obese  See history of present illness for detailed discussion of his current management, blood sugar patterns and problems identified  A1c is 7.9 and getting higher  She is on basal bolus insulin along with Invokana 100 mg  Blood sugars are variable after meals but mostly higher overnight indicating need for higher amount of basal insulin and increased insulin resistance She has not done well with her diet or exercise regimen Currently only on  100 mg Invokana She has not able to keep up with her lifestyle because of depression issues  A1c has been as low as 6.7 in the past  HYPOTHYROIDISM: She has fatigue but not clear if she is symptomatic from depression or hypothyroidism Also has some cold intolerance TSH not quite normal even with starting 25 mcg levothyroxine  Hypertension and hyperlipidemia followed by PCP, blood pressure relatively higher than usual  History of mild hypercalcemia: Potassium  is again upper normal, is on HCTZ  PLAN:   Increase Invokana to 300 mg Increase fluid intake consistently Since blood pressure is relatively high will not adjust her Zestoretic as yet  She will try to improve her diet and exercise regimen and given her ideas on how to exercise indoors  She will not change her insulin regimen as yet but she will let us know if she has consistently high fasting readings  Increase levothyroxine to 50 mcg She will take this before breakfast daily Discussed hypothyroidism and likely symptoms  She needs to talk to her PCP about her depression also  Reassured her about the safety and efficacy of the Covid vaccine and she will try to sign up this week  Follow-up in 3 months for A1c again    There are no Patient Instructions on file for this visit.  [name] 07/25/2019, 10:02 AM   Note: This office note was prepared with Dragon voice recognition system technology. Any transcriptional errors that result from this process are unintentional.

## 2019-07-26 ENCOUNTER — Telehealth: Payer: Self-pay | Admitting: Internal Medicine

## 2019-07-26 NOTE — Chronic Care Management (AMB) (Signed)
Chronic Care Management   Note  07/26/2019 Name: Kimberly [name] MRN: [id number] DOB: 04-10-1951  Kimberly [name] is a 69 y.o. year old female who is a primary care patient of Panosh, Standley Brooking, MD. I reached out to Gwenlyn Fudge by phone today in response to a referral sent by [name] PCP, Panosh, Standley Brooking, MD.   [name] was given information about Chronic Care Management services today including:  1. CCM service includes personalized support from designated clinical staff supervised by her physician, including individualized plan of care and coordination with other care providers 2. 24/7 contact phone numbers for assistance for urgent and routine care needs. 3. Service will only be billed when office clinical staff spend 20 minutes or more in a month to coordinate care. 4. Only one practitioner may furnish and bill the service in a calendar month. 5. The patient may stop CCM services at any time (effective at the end of the month) by phone call to the office staff. 6. The patient will be responsible for cost sharing (co-pay) of up to 20% of the service fee (after annual deductible is met).  Patient agreed to services and verbal consent obtained.   Follow up plan:   Raynicia Dukes [employer] Scheduler

## 2019-07-28 NOTE — Chronic Care Management (AMB) (Signed)
Chronic Care Management Pharmacy  Name: Kimberly [name]  MRN: [id number] DOB: December 02, 1950  Initial Questions: 1. Have you seen any other providers since your last visit? No  2. Any changes in your medicines or health? Yes   Chief Complaint/ HPI  Kimberly [name],  69 y.o. , female presents for their Initial CCM visit with the clinical pharmacist via telephone.  PCP : Burnis Medin, MD  Their chronic conditions include: HTN, CAD, DM, COPD, Hyperlipidemia, Osteoporosis, Vitamin D deficiency, Tobacco use  Office Visits:  11/25/2018- Patient presented via virtual visit to Dr. Shanon Ace, MD for hyperlipidemia follow up. Patient was advised to discontinue tobacco. Considering discontinuation of Vascepa.   Consult Visit: 07/25/2019- Endocrinology- Patient presented in office to Dr. [name], MD for diabetes follow up. Patient presented with A1c: 7.9% (increased). Increased Invokana to 311m. Levothyroxine increased to 536m.   04/18/2019- Endocrinology- Patient presented in office to Dr. AjElayne SnareMD for diabetes follow up. Discussed weight loss and exercising. No change in Invokana 15088mnd basal insulin. Novolog decreased from 10 units to 8 units at suppertime due to BGs <120s at night. Patient to trial levothyroxine 35m42m  03/04/2019- Ophthalmology- Patient presented in office to Dr. ChriWarden Fillers08/25/2020- Endocrinology- Patient presented in office to Dr. AjayElayne Snare for diabetes follow up. Lantus increased from  38 units to 40 units due to reported elevated BG readings. No change in Invokana and Novolog 1(5 units before lunch, 10 units before suppertime).   Medications: Outpatient Encounter Medications as of 07/29/2019  Medication Sig Note  . Accu-Chek FastClix Lancets MISC Use as instructed to check blood sugar 4 times daily.   . alMarland Kitchenuterol (PROVENTIL HFA;VENTOLIN HFA) 108 (90 Base) MCG/ACT inhaler Inhale 2 puffs into the lungs every 6 (six) hours as needed. For  shortness of breath or wheezing 10/11/2018: Only use in the summer  . aspirin 325 MG EC tablet Take 325 mg by mouth every morning.    . atMarland Kitchenrvastatin (LIPITOR) 80 MG tablet TAKE 1 TABLET BY MOUTH EVERY DAY   . Blood Glucose Monitoring Suppl (ACCU-CHEK GUIDE) w/Device KIT 1 each by Does not apply route 4 (four) times daily. Use Accu Chek to check blood sugar 4 times daily.   . canagliflozin (INVOKANA) 300 MG TABS tablet Take 1 tablet (300 mg total) by mouth daily before breakfast.   . ezetimibe (ZETIA) 10 MG tablet Take 1 tablet (10 mg total) by mouth daily.   . glMarland Kitchencose blood test strip Use Accu Check Guide test strips as instructed to check blood sugar 4 times daily.   . Insulin Pen Needle (BD PEN NEEDLE NANO U/F) 32G X 4 MM MISC Use as instructed to inject 3 insulin 3 times daily.   . LAMarland KitchenTUS SOLOSTAR 100 UNIT/ML Solostar Pen INJECT 40 UNITS UNDER THE SKIN DAILY   . levothyroxine (SYNTHROID) 50 MCG tablet Take 1 tablet (50 mcg total) by mouth daily.   . liMarland Kitcheninopril-hydrochlorothiazide (ZESTORETIC) 20-12.5 MG tablet Take 0.5 tablets by mouth daily. Increase to 1 po qd as directed   . NOVOLOG FLEXPEN 100 UNIT/ML FlexPen INJECT 18 UNITS UNDER THE SKIN AT LARGEST MEAL AND 10 UNITS AT DINNER    No facility-administered encounter medications on file as of 07/29/2019.     Current Diagnosis/Assessment:  Goals Addressed            This Visit's Progress   . Pharmacy Care Plan       Current Barriers:  .  Chronic Disease Management support, education, and care coordination needs related to CAD, HTN, HLD, COPD, DMII, and Osteoporosis, Tobacco Use, Vitamin D deficiency  Pharmacist Clinical Goal(s):  Marland Kitchen Achieve A1c <7.0% . Prevent recurrent heart attack . Cholesterol goals: Total Cholesterol goal under 200, Triglycerides goal under 150, HDL goal above 40 (men) or above 50 (women), LDL goal under 100 . Maintain Vitamin D-25 level at or above 30  Interventions: . Comprehensive medication review  performed. . Discussed smoking cessation.  . Discussed diet and exercise modifications and effect on blood pressure and cholesterol. . Obtain diabetic eye exam every 1 to 2 years.  . Obtain at least once yearly foot exams.  Patient Self Care Activities:  . Calls provider office for new concerns or questions . Continue current medications as directed by providers.  . Continue at home blood pressure readings. . Continue checking blood sugars.   Initial goal documentation        COPD / Tobacco   Last spirometry score: 82%  Gold Grade: Gold 2 (FEV1 50-79%) Current COPD Classification:  A (low sx, <2 exacerbations/yr)  Eosinophil count:   Lab Results  Component Value Date/Time   EOSPCT 2.6 08/09/2018 09:06 AM  %                               Eos (Absolute):  Lab Results  Component Value Date/Time   EOSABS 0.2 08/09/2018 09:06 AM    Tobacco Status:  Social History   Tobacco Use  Smoking Status Current Every Day Smoker  . Packs/day: 0.40  . Types: Cigarettes  Smokeless Tobacco Never Used   Patient has failed these meds in past: none  COPD is currently controlled on the following medications: albuterol HFA, 2 puffs every 6 hours as needed. Using maintenance inhaler regularly? No- patient is not on maintenance inhaler.  Frequency of rescue inhaler use:  infrequently- only in the summer time.   We discussed:  smoking cessation and proper inhaler technique. Currently patient is not interested in smoking cessation. Patient did increase number smoking (before 6-9 cigarettes/day, now smoking 1 ppd). Patient attributes increase due to pandemic.  Plan Continue current medications. Will reassess with patient at follow up,   Diabetes   Recent Relevant Labs: Lab Results  Component Value Date/Time   HGBA1C 7.9 (H) 07/19/2019 08:05 AM   HGBA1C 7.5 (H) 04/12/2019 07:51 AM   MICROALBUR 36.2 (H) 04/12/2019 07:51 AM   MICROALBUR 0.8 08/09/2018 09:06 AM    Checking BG: 2 to 3x  per day  Recent FBG Readings: 100 mg/dl Recent HS BG readings: 157 mg/dl  Patient has failed these meds in past: Amaryl (glimepiride), Levemir, Metformin, Januvia  Patient is currently uncontrolled on the following medications: Invokana 361m, 1 tablet once daily, Lantus, 40 units once daily, Novolog Flexpen, 18 units with largest meal and 10 units at dinner.   Last diabetic EYE exam:  Lab Results  Component Value Date/Time   HMDIABEYEEXA No Retinopathy 03/04/2019 12:00 AM    Last diabetic FOOT exam: Patient conducts daily foot exams and states Dr. KDwyane Deeconducted foot exam about 2 years ago.  Lab Results  Component Value Date/Time   HMDIABFOOTEX done 07/12/2010 12:00 AM   We discussed: how to recognize and treat signs of hypoglycemia.  Patient denies hypoglycemia episodes. Patient aware of treatment options. (ex. OJ).   Plan Managed by Dr. KDwyane Dee(endocrinology). Dr. KDwyane Deemodified DM regimen due to increased A1c.  Continue current medications and checking BGs at home.   Hypertension  .  BP today is:  <130/80  Office blood pressures are  BP Readings from Last 3 Encounters:  07/25/19 (!) 152/80  04/18/19 (!) 142/70  08/09/18 134/70   Patient has failed these meds in the past: none   Patient checks BP at home 1-2x per week.   Patient home BP readings are ranging: 120/80 mmHg.   Patient controlled on lisinopril/HCTZ 20/12.75m, 0.5 tablets daily.   We discussed diet and exercise extensively, and reduction in soduim intake.   Plan Denies dizziness/ lightheadedness.   Continue current medications and control with diet and exercise.   Hyperlipidemia   Lipid Panel     Component Value Date/Time   CHOL 112 02/03/2019 0746   TRIG 178.0 (H) 02/03/2019 0746   HDL 26.20 (L) 02/03/2019 0746   CHOLHDL 4 02/03/2019 0746   VLDL 35.6 02/03/2019 0746   LDLCALC 50 02/03/2019 0746   LDLDIRECT 54.0 11/24/2018 1336    Patient has failed these meds in past: fenofibrate, rosuvastatin    Patient is currently controlled on the following medications: Zetia 140m 1 tablet daily, Atorvastatin 8068m1 tablet once daily.   We discussed:  diet and exercise extensively.  Patient aware of proper diabetic diet (eats different kinds of vegetables and turKuwaithicken).   Plan Continue current medications and control with diet and exercise.  Osteoporosis/ vitamin D deficiency  Last bone density (07/2017): T score -1.2 (hip) 07/2015: -2.3 (wrist)  Patient has failed these meds in past: Evista (hot flashes and night sweats).  Patient is currently managed on the following medications: Vitamin D 1000 units daily  Vitamin D (11/24/2018): 70.06  Plan Continue current medications.   CAD, hx of MI, CABG 2012  Patient has failed these meds in past: none Patient is currently controlled on the following medications: ASA 325m54m tablet daily, Atorvastatin 80mg66mtablet once daily.   We discussed: higher dose of ASA. Patient states she was placed on this dose by cardiology.  Plan Managed by Dr. PeterJenkins [facility]). Patient to monitor for signs and symptoms of bleeding.  Continue current medications.  Hypothyroidism   TSH  Date Value Ref Range Status  07/19/2019 4.52 (H) 0.35 - 4.50 uIU/mL Final     Patient has failed these meds in past:none Patient is currently uncontrolled on the following medications: levothyroxine 50mcg86me daily.   We discussed:  hypo- and hyperthyroidism symptoms.   Plan Recent increase on 2/05 from 25mcg 72m0mcg o58mvothyroxine by Dr. Kumar (eDwyane Deeinology).  Continue current medications.  Medication Management   Patient manages medications: uses pill box to organize medications (except Invokana). Cost barriers: denies issues.   Follow up Follow up appointment in 6 months.    Annette Anson Crofts Clinical Pharmacist [phone]

## 2019-07-29 ENCOUNTER — Ambulatory Visit: Payer: Medicare HMO

## 2019-07-29 ENCOUNTER — Telehealth: Payer: Self-pay

## 2019-07-29 ENCOUNTER — Other Ambulatory Visit: Payer: Self-pay

## 2019-07-29 DIAGNOSIS — I10 Essential (primary) hypertension: Secondary | ICD-10-CM

## 2019-07-29 DIAGNOSIS — E1151 Type 2 diabetes mellitus with diabetic peripheral angiopathy without gangrene: Secondary | ICD-10-CM

## 2019-07-29 DIAGNOSIS — E1169 Type 2 diabetes mellitus with other specified complication: Secondary | ICD-10-CM

## 2019-07-29 DIAGNOSIS — Z79899 Other long term (current) drug therapy: Secondary | ICD-10-CM

## 2019-07-29 NOTE — Telephone Encounter (Signed)
Panoshed okayed referral has been placed

## 2019-07-29 NOTE — Addendum Note (Signed)
Addended by: Modena Morrow R on: 07/29/2019 03:56 PM   Modules accepted: Orders

## 2019-07-29 NOTE — Telephone Encounter (Signed)
I am requesting ambulatory referral to CCM (chronic care management) for [name]. I had a CCM visit today with patient and will be following long term.

## 2019-07-29 NOTE — Patient Instructions (Addendum)
Visit Information  Goals Addressed            This Visit's Progress   . Pharmacy Care Plan       Current Barriers:  . Chronic Disease Management support, education, and care coordination needs related to CAD, HTN, HLD, COPD, DMII, and Osteoporosis, Tobacco Use, Vitamin D deficiency  Pharmacist Clinical Goal(s):  Marland Kitchen Achieve A1c <7.0% . Prevent recurrent heart attack . Cholesterol goals: Total Cholesterol goal under 200, Triglycerides goal under 150, HDL goal above 40 (men) or above 50 (women), LDL goal under 100 . Maintain Vitamin D-25 level at or above 30  Interventions: . Comprehensive medication review performed. . Discussed smoking cessation.  . Discussed diet and exercise modifications and effect on blood pressure and cholesterol. . Obtain diabetic eye exam every 1 to 2 years.  . Obtain at least once yearly foot exams.  Patient Self Care Activities:  . Calls provider office for new concerns or questions . Continue current medications as directed by providers.  . Continue at home blood pressure readings. . Continue checking blood sugars.   Initial goal documentation        Kimberly [name] was given information about Chronic Care Management services today including:  1. CCM service includes personalized support from designated clinical staff supervised by her physician, including individualized plan of care and coordination with other care providers 2. 24/7 contact phone numbers for assistance for urgent and routine care needs. 3. Service will only be billed when office clinical staff spend 20 minutes or more in a month to coordinate care. 4. Only one practitioner may furnish and bill the service in a calendar month. 5. The patient may stop CCM services at any time (effective at the end of the month) by phone call to the office staff. 6. The patient will be responsible for cost sharing (co-pay) of up to 20% of the service fee (after annual deductible is met).  Patient agreed to  services and verbal consent obtained.   Print copy of patient instructions provided.  Telephone follow up appointment with pharmacy team member scheduled for: 01/26/2020  Anson Crofts, PharmD Clinical Pharmacist [facility] [place] [phone]    Diabetes Mellitus and [place] care is an important part of your health, especially when you have diabetes. Diabetes may cause you to have problems because of poor blood flow (circulation) to your feet and legs, which can cause your skin to:  Become thinner and drier.  Break more easily.  Heal more slowly.  Peel and crack. You may also have nerve damage (neuropathy) in your legs and feet, causing decreased feeling in them. This means that you may not notice minor injuries to your feet that could lead to more serious problems. Noticing and addressing any potential problems early is the best way to prevent future foot problems. How to care for your feet Foot hygiene  Wash your feet daily with warm water and mild soap. Do not use hot water. Then, pat your feet and the areas between your toes until they are completely dry. Do not soak your feet as this can dry your skin.  Trim your toenails straight across. Do not dig under them or around the cuticle. File the edges of your nails with an emery board or nail file.  Apply a moisturizing lotion or petroleum jelly to the skin on your feet and to dry, brittle toenails. Use lotion that does not contain alcohol and is unscented. Do not apply  lotion between your toes. Shoes and socks  Wear clean socks or stockings every day. Make sure they are not too tight. Do not wear knee-high stockings since they may decrease blood flow to your legs.  Wear shoes that fit properly and have enough cushioning. Always look in your shoes before you put them on to be sure there are no objects inside.  To break in new shoes, wear them for just a few hours a day. This prevents injuries on  your feet. Wounds, scrapes, corns, and calluses  Check your feet daily for blisters, cuts, bruises, sores, and redness. If you cannot see the bottom of your feet, use a mirror or ask someone for help.  Do not cut corns or calluses or try to remove them with medicine.  If you find a minor scrape, cut, or break in the skin on your feet, keep it and the skin around it clean and dry. You may clean these areas with mild soap and water. Do not clean the area with peroxide, alcohol, or iodine.  If you have a wound, scrape, corn, or callus on your foot, look at it several times a day to make sure it is healing and not infected. Check for: ? Redness, swelling, or pain. ? Fluid or blood. ? Warmth. ? Pus or a bad smell. General instructions  Do not cross your legs. This may decrease blood flow to your feet.  Do not use heating pads or hot water bottles on your feet. They may burn your skin. If you have lost feeling in your feet or legs, you may not know this is happening until it is too late.  Protect your feet from hot and cold by wearing shoes, such as at the beach or on hot pavement.  Schedule a complete foot exam at least once a year (annually) or more often if you have foot problems. If you have foot problems, report any cuts, sores, or bruises to your health care provider immediately. Contact a health care provider if:  You have a medical condition that increases your risk of infection and you have any cuts, sores, or bruises on your feet.  You have an injury that is not healing.  You have redness on your legs or feet.  You feel burning or tingling in your legs or feet.  You have pain or cramps in your legs and feet.  Your legs or feet are numb.  Your feet always feel cold.  You have pain around a toenail. Get help right away if:  You have a wound, scrape, corn, or callus on your foot and: ? You have pain, swelling, or redness that gets worse. ? You have fluid or blood coming  from the wound, scrape, corn, or callus. ? Your wound, scrape, corn, or callus feels warm to the touch. ? You have pus or a bad smell coming from the wound, scrape, corn, or callus. ? You have a fever. ? You have a red line going up your leg. Summary  Check your feet every day for cuts, sores, red spots, swelling, and blisters.  Moisturize feet and legs daily.  Wear shoes that fit properly and have enough cushioning.  If you have foot problems, report any cuts, sores, or bruises to your health care provider immediately.  Schedule a complete foot exam at least once a year (annually) or more often if you have foot problems. This information is not intended to replace advice given to you by your health care  provider. Make sure you discuss any questions you have with your health care provider. Document Revised: 02/23/2019 Document Reviewed: 07/04/2016 Elsevier Patient Education  [place].   Mindfulness-Based Stress Reduction Mindfulness-based stress reduction (MBSR) is a program that helps people learn to practice mindfulness. Mindfulness is the practice of intentionally paying attention to the present moment. It can be learned and practiced through techniques such as education, breathing exercises, meditation, and yoga. MBSR includes several mindfulness techniques in one program. MBSR works best when you understand the treatment, are willing to try new things, and can commit to spending time practicing what you learn. MBSR training may include learning about:  How your emotions, thoughts, and reactions affect your body.  New ways to respond to things that cause negative thoughts to start (triggers).  How to notice your thoughts and let go of them.  Practicing awareness of everyday things that you normally do without thinking.  The techniques and goals of different types of meditation. What are the benefits of MBSR? MBSR can have many benefits, which include helping you  to:  Develop self-awareness. This refers to knowing and understanding yourself.  Learn skills and attitudes that help you to participate in your own health care.  Learn new ways to care for yourself.  Be more accepting about how things are, and let things go.  Be less judgmental and approach things with an open mind.  Be patient with yourself and trust yourself more. MBSR has also been shown to:  Reduce negative emotions, such as depression and anxiety.  Improve memory and focus.  Change how you sense and approach pain.  Boost your body's ability to fight infections.  Help you connect better with other people.  Improve your sense of well-being. Follow these instructions at home:   Find a local in-person or online MBSR program.  Set aside some time regularly for mindfulness practice.  Find a mindfulness practice that works best for you. This may include one or more of the following: ? Meditation. Meditation involves focusing your mind on a certain thought or activity. ? Breathing awareness exercises. These help you to stay present by focusing on your breath. ? Body scan. For this practice, you lie down and pay attention to each part of your body from head to toe. You can identify tension and soreness and intentionally relax parts of your body. ? Yoga. Yoga involves stretching and breathing, and it can improve your ability to move and be flexible. It can also provide an experience of testing your body's limits, which can help you release stress. ? Mindful eating. This way of eating involves focusing on the taste, texture, color, and smell of each bite of food. Because this slows down eating and helps you feel full sooner, it can be an important part of a weight-loss plan.  Find a podcast or recording that provides guidance for breathing awareness, body scan, or meditation exercises. You can listen to these any time when you have a free moment to rest without  distractions.  Follow your treatment plan as told by your health care provider. This may include taking regular medicines and making changes to your diet or lifestyle as recommended. How to practice mindfulness To do a basic awareness exercise:  Find a comfortable place to sit.  Pay attention to the present moment. Observe your thoughts, feelings, and surroundings just as they are.  Avoid placing judgment on yourself, your feelings, or your surroundings. Make note of any judgment that comes up,  and let it go.  Your mind may wander, and that is okay. Make note of when your thoughts drift, and return your attention to the present moment. To do basic mindfulness meditation:  Find a comfortable place to sit. This may include a stable chair or a firm floor cushion. ? Sit upright with your back straight. Let your arms fall next to your side with your hands resting on your legs. ? If sitting in a chair, rest your feet flat on the floor. ? If sitting on a cushion, cross your legs in front of you.  Keep your head in a neutral position with your chin dropped slightly. Relax your jaw and rest the tip of your tongue on the roof of your mouth. Drop your gaze to the floor. You can close your eyes if you like.  Breathe normally and pay attention to your breath. Feel the air moving in and out of your nose. Feel your belly expanding and relaxing with each breath.  Your mind may wander, and that is okay. Make note of when your thoughts drift, and return your attention to your breath.  Avoid placing judgment on yourself, your feelings, or your surroundings. Make note of any judgment or feelings that come up, let them go, and bring your attention back to your breath.  When you are ready, lift your gaze or open your eyes. Pay attention to how your body feels after the meditation. Where to find more information You can find more information about MBSR from:  Your health care provider.  Community-based  meditation centers or programs.  Programs offered near you. Summary  Mindfulness-based stress reduction (MBSR) is a program that teaches you how to intentionally pay attention to the present moment. It is used with other treatments to help you cope better with daily stress, emotions, and pain.  MBSR focuses on developing self-awareness, which allows you to respond to life stress without judgment or negative emotions.  MBSR programs may involve learning different mindfulness practices, such as breathing exercises, meditation, yoga, body scan, or mindful eating. Find a mindfulness practice that works best for you, and set aside time for it on a regular basis. This information is not intended to replace advice given to you by your health care provider. Make sure you discuss any questions you have with your health care provider. Document Revised: 05/15/2017 Document Reviewed: 10/09/2016 Elsevier Patient Education  [place].

## 2019-08-12 ENCOUNTER — Other Ambulatory Visit: Payer: Self-pay | Admitting: Endocrinology

## 2019-08-29 ENCOUNTER — Other Ambulatory Visit: Payer: Self-pay | Admitting: Internal Medicine

## 2019-10-24 ENCOUNTER — Other Ambulatory Visit: Payer: Self-pay

## 2019-10-24 ENCOUNTER — Other Ambulatory Visit (INDEPENDENT_AMBULATORY_CARE_PROVIDER_SITE_OTHER): Payer: Medicare HMO

## 2019-10-24 DIAGNOSIS — Z794 Long term (current) use of insulin: Secondary | ICD-10-CM | POA: Diagnosis not present

## 2019-10-24 DIAGNOSIS — E063 Autoimmune thyroiditis: Secondary | ICD-10-CM | POA: Diagnosis not present

## 2019-10-24 DIAGNOSIS — E1165 Type 2 diabetes mellitus with hyperglycemia: Secondary | ICD-10-CM

## 2019-10-24 LAB — BASIC METABOLIC PANEL
BUN: 22 mg/dL (ref 6–23)
CO2: 28 mEq/L (ref 19–32)
Calcium: 10 mg/dL (ref 8.4–10.5)
Chloride: 101 mEq/L (ref 96–112)
Creatinine, Ser: 0.8 mg/dL (ref 0.40–1.20)
GFR: 71.36 mL/min (ref >60.00)
Glucose, Bld: 167 mg/dL — ABNORMAL HIGH (ref 70–99)
Potassium: 4.4 mEq/L (ref 3.5–5.1)
Sodium: 137 mEq/L (ref 135–145)

## 2019-10-24 LAB — T4, FREE: Free T4: 1.23 ng/dL (ref 0.60–1.60)

## 2019-10-24 LAB — HEMOGLOBIN A1C: Hgb A1c MFr Bld: 7.4 % — ABNORMAL HIGH (ref 4.6–6.5)

## 2019-10-24 LAB — TSH: TSH: 2.35 u[IU]/mL (ref 0.35–4.50)

## 2019-10-27 ENCOUNTER — Other Ambulatory Visit: Payer: Self-pay

## 2019-10-27 ENCOUNTER — Encounter: Payer: Self-pay | Admitting: Endocrinology

## 2019-10-27 ENCOUNTER — Ambulatory Visit: Payer: Medicare HMO | Admitting: Endocrinology

## 2019-10-27 VITALS — BP 140/70 | HR 82 | Ht 63.0 in | Wt 162.8 lb

## 2019-10-27 DIAGNOSIS — Z794 Long term (current) use of insulin: Secondary | ICD-10-CM | POA: Diagnosis not present

## 2019-10-27 DIAGNOSIS — E1165 Type 2 diabetes mellitus with hyperglycemia: Secondary | ICD-10-CM

## 2019-10-27 DIAGNOSIS — E063 Autoimmune thyroiditis: Secondary | ICD-10-CM

## 2019-10-27 DIAGNOSIS — E782 Mixed hyperlipidemia: Secondary | ICD-10-CM | POA: Diagnosis not present

## 2019-10-27 DIAGNOSIS — J449 Chronic obstructive pulmonary disease, unspecified: Secondary | ICD-10-CM | POA: Diagnosis not present

## 2019-10-27 LAB — GLUCOSE, POCT (MANUAL RESULT ENTRY): POC Glucose: 282 mg/dl — AB (ref 70–99)

## 2019-10-27 NOTE — Patient Instructions (Addendum)
Lantus 43 at nite and keep am sugar between 90-130  Check blood sugars on waking up days a week  Also check blood sugars about 2 hours after meals and do this after different meals by rotation  Recommended blood sugar levels on waking up are 90-130 and about 2 hours after meal is 130-160  Please bring your blood sugar monitor to each visit, thank you  No juice in am or take Novolog in am

## 2019-10-27 NOTE — Progress Notes (Signed)
Patient ID: Kimberly [name], female   DOB: 02-06-1951, 69 y.o.   MRN: [id number]           Reason for Appointment: Endocrinology follow-up    History of Present Illness:          Date of diagnosis of type 2 diabetes mellitus: 1995        Background history:   For several years she had been tried on oral agents with variable control.  Detailed history not available She thinks that she had nausea and vomiting with metformin and could not take much Also had been tried on Amaryl  She thinks she had swelling with Januvia and not clear what other medications she has tried before  She was started on insulin in 07/2013 when her A1c was over 11% Because of her poor control and A1c of 9.1 she was started on Invokana 100 mg daily in 12/16  Recent history:   INSULIN regimen is: 40 Lantus hs   Novolog 15  units before lunch. Novolog 10 acs    Non-insulin hypoglycemic drugs the patient is taking are:  Invokana 300 mg daily  Her A1c is again higher at 7.9, previously has been as low as 6.7   Current blood sugar patterns, management and problems identified:  She did not bring her monitor  Invokana was increased to 300 mg when she was having higher fasting readings  However she still continues to have high fasting readings reportedly  Lab glucose was 167 postprandially and today it is 282 but she had orange juice which she does not normally have  Is trying to do a little more walking or other activities but not very much especially in the last 2 weeks  She previously was gaining weight and now this is down about 1 pound  However in the last 2 weeks she has been eating differently and adding another meal at breakfast with living with her sister  She does not think her readings are high after lunch or dinner but does not remember the exact readings  Renal function same with 300 mg Invokana, tolerating this without side effects  She is fairly consistent with taking her mealtime and Lantus  insulin doses as directed, on the same doses as on the last visit        Side effects from medications have been: Nausea from metformin,?  Swelling from Januvia  Glucose monitoring:  done 1-2 times a day         Glucometer:  Contour       Blood Glucose readings from recall   PRE-MEAL Fasting Lunch Dinner Bedtime Overall  Glucose range: 150-160      Mean/median:     ?   POST-MEAL PC Breakfast PC Lunch PC Dinner  Glucose range:  <160   Mean/median:      Previous readings:  PRE-MEAL Fasting Lunch Dinner Bedtime Overall  Glucose range:  110-170      Mean/median:  155     145   POST-MEAL PC Breakfast PC Lunch PC Dinner  Glucose range:   100-187  99-212  Mean/median:   130  151        Self-care: The diet that the patient has been following is: tries to limit fats, breads.     Meal times are:  Lunch: 12 pm Dinner: 5-7 PM   Typical meal intake: Breakfast is minimal          Dietician visit, most recent: 09/2015  Weight history:  Wt Readings from Last 3 Encounters:  10/27/19 162 lb 12.8 oz (73.8 kg)  07/25/19 163 lb 12.8 oz (74.3 kg)  04/18/19 161 lb 12.8 oz (73.4 kg)    Glycemic control:   Lab Results  Component Value Date   HGBA1C 7.4 (H) 10/24/2019   HGBA1C 7.9 (H) 07/19/2019   HGBA1C 7.5 (H) 04/12/2019   Lab Results  Component Value Date   MICROALBUR 36.2 (H) 04/12/2019   LDLCALC 50 02/03/2019   CREATININE 0.80 10/24/2019       Allergies as of 10/27/2019      Reactions   Metformin And Related Nausea And Vomiting   After one pill at hospital   Januvia [sitagliptin Phosphate] Other (See Comments)   Swelling and edema see 4 13 OV      Medication List       Accurate as of Oct 27, 2019  9:39 AM. If you have any questions, ask your nurse or doctor.        Accu-Chek FastClix Lancets Misc Use as instructed to check blood sugar 4 times daily.   Accu-Chek Guide w/Device Kit 1 each by Does not apply route 4 (four) times daily. Use Accu  Chek to check blood sugar 4 times daily.   albuterol 108 (90 Base) MCG/ACT inhaler Commonly known as: VENTOLIN HFA Inhale 2 puffs into the lungs every 6 (six) hours as needed. For shortness of breath or wheezing   aspirin 325 MG EC tablet Take 325 mg by mouth every morning.   atorvastatin 80 MG tablet Commonly known as: LIPITOR TAKE 1 TABLET BY MOUTH EVERY DAY   BD Pen Needle Nano U/F 32G X 4 MM Misc Generic drug: Insulin Pen Needle Use as instructed to inject 3 insulin 3 times daily.   canagliflozin 300 MG Tabs tablet Commonly known as: Invokana Take 1 tablet (300 mg total) by mouth daily before breakfast.   ezetimibe 10 MG tablet Commonly known as: Zetia Take 1 tablet (10 mg total) by mouth daily.   glucose blood test strip Use Accu Check Guide test strips as instructed to check blood sugar 4 times daily.   Lantus SoloStar 100 UNIT/ML Solostar Pen Generic drug: insulin glargine INJECT 40 UNITS UNDER THE SKIN DAILY   levothyroxine 50 MCG tablet Commonly known as: SYNTHROID Take 1 tablet (50 mcg total) by mouth daily.   lisinopril-hydrochlorothiazide 20-12.5 MG tablet Commonly known as: ZESTORETIC Take 0.5 tablets by mouth daily. Increase to 1 po qd as directed   NovoLOG FlexPen 100 UNIT/ML FlexPen Generic drug: insulin aspart INJECT 18 UNITS UNDER THE SKIN AT LARGEST MEAL AND 10 UNITS AT DINNER       Allergies:  Allergies  Allergen Reactions  . Metformin And Related Nausea And Vomiting    After one pill at hospital  . Januvia [Sitagliptin Phosphate] Other (See Comments)    Swelling and edema see 4 13 OV    Past Medical History:  Diagnosis Date  . CAD (coronary artery disease)   . Colitis   . Colitis, indeterminate 01/31/2012  . COPD (chronic obstructive pulmonary disease) ([place])   . Depression 05/16/2017  . Depression with anxiety 05/30/2016  . Diverticulitis 02/10/2012  . DM (diabetes mellitus) ([place])   . Glaucoma   . Glaucoma   . History of  ventricular fibrillation   . HTN (hypertension)   . Myocardial infarction ([place])    12/1998, 01/2011  . Osteoporosis   . PVD (peripheral vascular disease) ([place])  Past Surgical History:  Procedure Laterality Date  . ABDOMINAL HYSTERECTOMY     endometriosis  . CABG x 3  01/22/2011   Dr Cyndia Bent  . CARDIAC CATHETERIZATION  01/17/2011   Dr Arvella Merles  . LIPOMA EXCISION Left 02/08/2015   Procedure: EXCISION OF LEFT THIGH LIPOMA;  Surgeon: Greer Pickerel, MD;  Location: [facility];  Service: General;  Laterality: Left;  . S/P aortobifemoral bypass  2008   Dr Donnetta Hutching  . S/P myocardial infarction and PCI of the lt circumflex  2000    Family History  Problem Relation Age of Onset  . Heart attack Father   . Diabetes Father   . Heart disease Father   . Diabetes Brother   . Diabetes Mother   . Liver cancer Mother   . Hypertension Other   . Thyroid disease Other        sisters- sister had parathyroidectomy  . Alcohol abuse Other        brother  . Colon polyps Maternal Uncle   . Breast cancer Sister   . Diabetes Sister   . Thyroid disease Sister     Social History:  reports that she has been smoking cigarettes. She has been smoking about 0.40 packs per day. She has never used smokeless tobacco. She reports that she does not drink alcohol or use drugs.    Review of Systems    Lipid history: On 80 mg Lipitor with history of CAD Prescribed by PCP  Her LDL is last improved along with triglycerides  Lab Results  Component Value Date   CHOL 112 02/03/2019   CHOL 102 11/24/2018   CHOL 147 05/20/2018   Lab Results  Component Value Date   HDL 26.20 (L) 02/03/2019   HDL 22.20 (L) 11/24/2018   HDL 28.70 (L) 05/20/2018   Lab Results  Component Value Date   LDLCALC 50 02/03/2019   LDLCALC 87 05/20/2018   LDLCALC 70 06/22/2017   Lab Results  Component Value Date   TRIG 178.0 (H) 02/03/2019   TRIG 303.0 (H) 11/24/2018   TRIG 156.0 (H) 05/20/2018   Lab Results  Component  Value Date   CHOLHDL 4 02/03/2019   CHOLHDL 5 11/24/2018   CHOLHDL 5 05/20/2018   Lab Results  Component Value Date   LDLDIRECT 54.0 11/24/2018   LDLDIRECT 87.0 04/24/2015   LDLDIRECT 150.3 07/19/2013            Hypertension: Present for several years Zestoretic  was reduced to half a tablet with starting Invokana previously Blood pressure at home recently 120/80   BP Readings from Last 3 Encounters:  10/27/19 140/70  07/25/19 (!) 152/80  04/18/19 (!) 142/70    HYPERCALCEMIA: She has had periodic mild hypercalcemia since about 2010; PTH levels in the mid-upper range Calcium is upper normal and stable  Lab Results  Component Value Date   CALCIUM 10.0 10/24/2019    She is taking 1000 units of vitamin D with adequate vitamin D levels  No history of fractures  Her last bone density was in 2/19 and this showed T score of -1.2 indicating mild osteopenia at the hip, her spine also shows a relative decline  She was tried on Evista but she says it was causing her hot flashes and night sweats and she stopped taking this  Previously in 07/2015 T score -2.3 was at the wrist, this was not measured in 2019  Most recent eye exam was normal in 9/29 and she does go  every year  Most recent foot exam: 07/2018 by PCP  HYPOTHYROIDISM:  She has a family history of hypothyroidism, Graves' disease and thyroid nodules  On her visit in 11/20 she was complaining of feeling more tired as well as colder had some weight gain preceding also.  TSH was 6  With levothyroxine 25 mcg daily she did not feel any better and with TSH still 4.5 she was told to go up to 50 mcg in 07/2019  Has taken this in the morning regularly She feels fairly good although sometimes tired from stress recently  Her TSH is now better at 2.35   Lab Results  Component Value Date   TSH 2.35 10/24/2019   TSH 4.52 (H) 07/19/2019   TSH 5.99 (H) 04/12/2019   FREET4 1.23 10/24/2019   FREET4 0.89 07/19/2019   FREET4  0.99 10/01/2011     Review of Systems    LABS:  Office Visit on 10/27/2019  Component Date Value Ref Range Status  . POC Glucose 10/27/2019 282* 70 - 99 mg/dl Final  Lab on 10/24/2019  Component Date Value Ref Range Status  . Free T4 10/24/2019 1.23  0.60 - 1.60 ng/dL Final   Comment: Specimens from patients who are undergoing biotin therapy and /or ingesting biotin supplements may contain high levels of biotin.  The higher biotin concentration in these specimens interferes with this Free T4 assay.  Specimens that contain high levels  of biotin may cause false high results for this Free T4 assay.  Please interpret results in light of the total clinical presentation of the patient.    Marland Kitchen TSH 10/24/2019 2.35  0.35 - 4.50 uIU/mL Final  . Sodium 10/24/2019 137  135 - 145 mEq/L Final  . Potassium 10/24/2019 4.4  3.5 - 5.1 mEq/L Final  . Chloride 10/24/2019 101  96 - 112 mEq/L Final  . CO2 10/24/2019 28  19 - 32 mEq/L Final  . Glucose, Bld 10/24/2019 167* 70 - 99 mg/dL Final  . BUN 10/24/2019 22  6 - 23 mg/dL Final  . Creatinine, Ser 10/24/2019 0.80  0.40 - 1.20 mg/dL Final  . GFR 10/24/2019 71.36  >60.00 mL/min Final  . Calcium 10/24/2019 10.0  8.4 - 10.5 mg/dL Final  . Hgb A1c MFr Bld 10/24/2019 7.4* 4.6 - 6.5 % Final   Glycemic Control Guidelines for People with Diabetes:Non Diabetic:  <6%Goal of Therapy: <7%Additional Action Suggested:  >8%     Physical Examination:  BP 140/70 (BP Location: Left Arm, Patient Position: Sitting, Cuff Size: Normal)   Pulse 82   Ht '5\' 3"'$  (1.6 m)   Wt 162 lb 12.8 oz (73.8 kg)   SpO2 95%   BMI 28.84 kg/m     ASSESSMENT:  Diabetes type 2, mildly obese  See history of present illness for detailed discussion of his current management, blood sugar patterns and problems identified  A1c is better at 7.4  She is on basal bolus insulin along with Invokana 300 mg  Although her fasting readings are still high in the morning usually she has had  better control with increasing Invokana Also a little more active compared to last visit Unable to download her meter and not clear how often she is having high readings or whether her postprandial readings are consistent Recently diet has been variable but eating a higher fat breakfast and today blood sugar is significantly high especially with drinking juice  A1c has been as low as 6.7 in the past  HYPOTHYROIDISM: She  has less fatigue and with 50 mcg levothyroxine her TSH is back to normal  Hypertension: Well-controlled, no change with increasing Invokana  She still has not had her Covid vaccine and is still afraid of side effects  PLAN:   Continue with 300 mg Invokana Increase Lantus to at least 43 Discussed adjusting it 1-2 units to get morning sugars between 90-130 More consistent monitoring after meals including breakfast Most likely she may need 5 units to cover her breakfast meal especially with eating any carbohydrate or drinking juice Keep postprandial readings under 160 Encourage her to start walking for exercise  Stay on 50 mcg of levothyroxine  Reassured her about the safety and efficacy of the Covid vaccine and again emphasized the need to take this, patient information given  Follow-up in 3 months for A1c again    Patient Instructions  Lantus 43 at nite and keep am sugar between 90-130  Check blood sugars on waking up days a week  Also check blood sugars about 2 hours after meals and do this after different meals by rotation  Recommended blood sugar levels on waking up are 90-130 and about 2 hours after meal is 130-160  Please bring your blood sugar monitor to each visit, thank you  No juice in am or take Novolog in am      [name] 10/27/2019, 9:39 AM   Note: This office note was prepared with Dragon voice recognition system technology. Any transcriptional errors that result from this process are unintentional.

## 2019-11-04 ENCOUNTER — Other Ambulatory Visit: Payer: Self-pay | Admitting: Endocrinology

## 2019-11-04 DIAGNOSIS — E119 Type 2 diabetes mellitus without complications: Secondary | ICD-10-CM | POA: Diagnosis not present

## 2019-11-04 DIAGNOSIS — H43813 Vitreous degeneration, bilateral: Secondary | ICD-10-CM | POA: Diagnosis not present

## 2019-11-04 DIAGNOSIS — Z961 Presence of intraocular lens: Secondary | ICD-10-CM | POA: Diagnosis not present

## 2019-11-04 DIAGNOSIS — H2512 Age-related nuclear cataract, left eye: Secondary | ICD-10-CM | POA: Diagnosis not present

## 2019-11-04 LAB — HM DIABETES EYE EXAM

## 2019-11-13 ENCOUNTER — Other Ambulatory Visit: Payer: Self-pay | Admitting: Endocrinology

## 2019-11-22 ENCOUNTER — Other Ambulatory Visit: Payer: Self-pay | Admitting: Internal Medicine

## 2019-12-07 ENCOUNTER — Other Ambulatory Visit: Payer: Self-pay | Admitting: Internal Medicine

## 2020-01-10 ENCOUNTER — Other Ambulatory Visit: Payer: Self-pay | Admitting: Internal Medicine

## 2020-01-10 ENCOUNTER — Other Ambulatory Visit: Payer: Self-pay | Admitting: Endocrinology

## 2020-01-26 ENCOUNTER — Other Ambulatory Visit: Payer: Self-pay

## 2020-01-26 ENCOUNTER — Telehealth: Payer: Self-pay

## 2020-01-26 ENCOUNTER — Ambulatory Visit: Payer: Medicare HMO

## 2020-01-26 DIAGNOSIS — F17200 Nicotine dependence, unspecified, uncomplicated: Secondary | ICD-10-CM

## 2020-01-26 DIAGNOSIS — E1169 Type 2 diabetes mellitus with other specified complication: Secondary | ICD-10-CM

## 2020-01-26 DIAGNOSIS — E1151 Type 2 diabetes mellitus with diabetic peripheral angiopathy without gangrene: Secondary | ICD-10-CM

## 2020-01-26 DIAGNOSIS — I10 Essential (primary) hypertension: Secondary | ICD-10-CM

## 2020-01-26 NOTE — Progress Notes (Addendum)
    Chronic Care Management Pharmacy Assistant   Name: Kimberly [name]  MRN: [id number] DOB: Mar 05, 1951  Reason for Encounter: Patient Assistance Application   PCP : Burnis Medin, MD  Allergies:   Allergies  Allergen Reactions   Metformin And Related Nausea And Vomiting    After one pill at hospital   Januvia [Sitagliptin Phosphate] Other (See Comments)    Swelling and edema see 4 13 OV    Medications: Outpatient Encounter Medications as of 01/26/2020  Medication Sig Note   Accu-Chek FastClix Lancets MISC Use as instructed to check blood sugar 4 times daily.    albuterol (PROVENTIL HFA;VENTOLIN HFA) 108 (90 Base) MCG/ACT inhaler Inhale 2 puffs into the lungs every 6 (six) hours as needed. For shortness of breath or wheezing 10/11/2018: Only use in the summer   aspirin 325 MG EC tablet Take 325 mg by mouth every morning.     atorvastatin (LIPITOR) 80 MG tablet TAKE 1 TABLET BY MOUTH EVERY DAY    Blood Glucose Monitoring Suppl (ACCU-CHEK GUIDE) w/Device KIT 1 each by Does not apply route 4 (four) times daily. Use Accu Chek to check blood sugar 4 times daily.    ezetimibe (ZETIA) 10 MG tablet TAKE 1 TABLET BY MOUTH EVERY DAY    glucose blood test strip Use Accu Check Guide test strips as instructed to check blood sugar 4 times daily.    insulin glargine (LANTUS SOLOSTAR) 100 UNIT/ML Solostar Pen INJECT 43 units once daily    Insulin Pen Needle (BD PEN NEEDLE NANO U/F) 32G X 4 MM MISC Use as instructed to inject 3 insulin 3 times daily.    INVOKANA 300 MG TABS tablet TAKE 1 TABLET (300 MG TOTAL) BY MOUTH DAILY BEFORE BREAKFAST.    levothyroxine (SYNTHROID) 50 MCG tablet Take 1 tablet (50 mcg total) by mouth daily.    lisinopril-hydrochlorothiazide (ZESTORETIC) 20-12.5 MG tablet Take 0.5 tablets by mouth daily. Increase to 1 po qd as directed    NOVOLOG FLEXPEN 100 UNIT/ML FlexPen INJECT 18 UNITS UNDER THE SKIN AT LARGEST MEAL AND 10 UNITS AT DINNER    No facility-administered encounter  medications on file as of 01/26/2020.    Current Diagnosis: Patient Active Problem List   Diagnosis Date Noted   Death of family member Oct 22, 2013   Type II or unspecified type diabetes mellitus with peripheral circulatory disorders, uncontrolled(250.72) 07/25/2013   Diabetes mellitus with peripheral vascular disease ([place]) 09/25/2011   Encounter for preventive health examination 03/29/2011   COPD (chronic obstructive pulmonary disease) ([place])    History of ventricular fibrillation    GLAUCOMA 07/12/2010   HYPERCALCEMIA 33/35/4562   SYSTOLIC MURMUR 56/38/9373   DEFICIENCY, VITAMIN D NOS 03/31/2007   OSTEOPOROSIS 03/24/2007   PERIPHERAL VASCULAR DISEASE 01/03/2007   DIABETES MELLITUS, TYPE II 12/03/2006   Hyperlipidemia associated with type 2 diabetes mellitus ([place]) 12/03/2006   TOBACCO USER 12/03/2006   Essential hypertension 12/03/2006   Coronary atherosclerosis 12/03/2006    Completed patient assistance for Invokana medication. Application was mailed to patient. Patient aware to complete highlighted areas and return to PCP office: [facility] [place], [place], [place], [place] Pharmacist Assistant [phone] Follow-Up:  Patient Assistance Coordination

## 2020-01-26 NOTE — Chronic Care Management (AMB) (Signed)
Chronic Care Management Pharmacy  Name: Kimberly [name]  MRN: [id number] DOB: 20-Jun-1950  Initial Questions: 1. Have you seen any other providers since your last visit? Yes  2. Any changes in your medicines or health? Yes   Chief Complaint/ HPI   Kimberly [name],  69 y.o. , female presents for their Follow-Up CCM visit with the clinical pharmacist via telephone.  Patient reported doing well. She reports she is not interested in smoking cessation. She notes she did increase number smoking. Patient attributes smoking due to pandemic, and will reconsider once pandemic is over.   PCP : Burnis Medin, MD  Their chronic conditions include: HTN, CAD, DM, COPD, Hyperlipidemia, Osteopenia, Vitamin D deficiency, Tobacco use   Office Visits:  11/25/2018- Patient presented via virtual visit to Dr. Shanon Ace, MD for hyperlipidemia follow up. Patient was advised to discontinue tobacco. Considering discontinuation of Vascepa.   Consult Visit: 10/27/2019- Endocrinology- [name], MD- Patient presented for office visit for DM follow up. A1c: 7.4%. Continue Invokana 371m. Increase Lantus to 43 units and adjust 1 to 2 units to get morning sugars between 90-130. Continue on levothyroxine 543m. Follow up in 3 months.   07/25/2019- Endocrinology- Patient presented in office to Dr. AjElayne SnareMD for diabetes follow up. Patient presented with A1c: 7.9% (increased). Increased Invokana to 30067mLevothyroxine increased to 86m17m  04/18/2019- Endocrinology- Patient presented in office to Dr. AjayElayne Snare for diabetes follow up. Discussed weight loss and exercising. No change in Invokana 186mg32m basal insulin. Novolog decreased from 10 units to 8 units at suppertime due to BGs <120s at night. Patient to trial levothyroxine 25mcg27mMedications: Outpatient Encounter Medications as of 01/26/2020  Medication Sig Note  . Accu-Chek FastClix Lancets MISC Use as instructed to check blood sugar 4 times daily.    . albuMarland Kitchenerol (PROVENTIL HFA;VENTOLIN HFA) 108 (90 Base) MCG/ACT inhaler Inhale 2 puffs into the lungs every 6 (six) hours as needed. For shortness of breath or wheezing 10/11/2018: Only use in the summer  . aspirin 325 MG EC tablet Take 325 mg by mouth every morning.    . atorMarland Kitchenastatin (LIPITOR) 80 MG tablet TAKE 1 TABLET BY MOUTH EVERY DAY   . Blood Glucose Monitoring Suppl (ACCU-CHEK GUIDE) w/Device KIT 1 each by Does not apply route 4 (four) times daily. Use Accu Chek to check blood sugar 4 times daily.   . ezetMarland Kitchenmibe (ZETIA) 10 MG tablet TAKE 1 TABLET BY MOUTH EVERY DAY   . glucose blood test strip Use Accu Check Guide test strips as instructed to check blood sugar 4 times daily.   . insulin glargine (LANTUS SOLOSTAR) 100 UNIT/ML Solostar Pen INJECT 43 units once daily   . Insulin Pen Needle (BD PEN NEEDLE NANO U/F) 32G X 4 MM MISC Use as instructed to inject 3 insulin 3 times daily.   . INVOKANA 300 MG TABS tablet TAKE 1 TABLET (300 MG TOTAL) BY MOUTH DAILY BEFORE BREAKFAST.   . levoMarland Kitchenhyroxine (SYNTHROID) 50 MCG tablet Take 1 tablet (50 mcg total) by mouth daily.   . lisiMarland Kitchenopril-hydrochlorothiazide (ZESTORETIC) 20-12.5 MG tablet Take 0.5 tablets by mouth daily. Increase to 1 po qd as directed   . NOVOLOG FLEXPEN 100 UNIT/ML FlexPen INJECT 18 UNITS UNDER THE SKIN AT LARGEST MEAL AND 10 UNITS AT DINNER    No facility-administered encounter medications on file as of 01/26/2020.     Current Diagnosis/Assessment:  Goals Addressed  This Visit's Progress   . Pharmacy Care Plan       CARE PLAN ENTRY (see longitudinal plan of care for additional care plan information)  Current Barriers:  . Chronic Disease Management support, education, and care coordination needs related to Hypertension, Hyperlipidemia, Diabetes, Osteopenia, and Tobacco use   Hypertension BP Readings from Last 3 Encounters:  10/27/19 140/70  07/25/19 (!) 152/80  04/18/19 (!) 142/70   . Pharmacist Clinical  Goal(s): o Over the next 180 days, patient will work with PharmD and providers to maintain BP goal <130/80 . Current regimen:  o  lisinopril/HCTZ 20/12.8m, 0.5 tablets daily. . Interventions: o We discussed diet and exercise extensively, and reduction in soduim intake.  . Patient self care activities - Over the next 180 days, patient will: o Check BP 1 to 2 times per week, document, and provide at future appointments o Ensure daily salt intake < 2300 mg/day  Hyperlipidemia Lab Results  Component Value Date/Time   LDLCALC 50 02/03/2019 07:46 AM   LDLDIRECT 54.0 11/24/2018 01:36 PM   . Pharmacist Clinical Goal(s): o Over the next 180 days, patient will work with PharmD and providers to maintain LDL goal < 70 . Current regimen:    Zetia 146m 1 tablet daily  Atorvastatin 8048m1 tablet once daily.  . Patient self care activities - Over the next 180 days, patient will: o Continue current medications as directed by provider   Diabetes Lab Results  Component Value Date/Time   HGBA1C 7.4 (H) 10/24/2019 08:07 AM   HGBA1C 7.9 (H) 07/19/2019 08:05 AM   . Pharmacist Clinical Goal(s): o Over the next 180 days, patient will work with PharmD and providers to achieve A1c goal <7% or as directed by endocrinologist.  . Current regimen:   Invokana 300m93m tablet once daily  Lantus, 43 units once daily at bedtime  Novolog Flexpen, 18 units with largest meal and 10 units at dinner.  . Interventions: o We discussed: how to recognize and treat signs of hypoglycemia.  . Patient self care activities - Over the next 180 days, patient will: o Check blood sugar twice daily, document, and provide at future appointments o Contact provider with any episodes of hypoglycemia  Osteopenia  Last vitamin D Lab Results  Component Value Date   VD25OH 70.06 11/24/2018 .  Pharmacist Clinical Goal(s) o Over the next 180 days, patient will work with PharmD and providers to maintain vitamin D levels: at  or above 30 and decrease risk for bone fractures. . Current regimen:  o Vitamin D 1000 units daily . Interventions: o Recommend [phone] units of vitamin D daily. Recommend 1200 mg of calcium daily from dietary and supplemental sources . Patient self care activities - Over the next 180 days, patient will: o Continue current medications as directed by provider   Tobacco use . Pharmacist Clinical Goal(s) o Over the next 180 days, patient will work with PharmD and providers to decrease number of cigarettes smoked per day.  . Current regimen:  o No medications  . Interventions: o Provided contact information for [place] [facility]uit [facility] (1-800-QUIT-NOW) and encouraged patient to reach out to this group for support. . Patient self care activities o Patient will continue working on smoking cessation and follow up with providers to reach goals.   Medication management . Pharmacist Clinical Goal(s): o Over the next 180 days, patient will work with PharmD and providers to maintain optimal medication adherence . Current [facility]  . Interventions o Comprehensive medication  review performed. o Continue current medication management strategy . Patient self care activities - Over the next 180 days, patient will: o Take medications as prescribed o Report any questions or concerns to PharmD and/or provider(s)  Please see past updates related to this goal by clicking on the "Past Updates" button in the selected goal         SDOH Interventions     Most Recent Value  SDOH Interventions  Financial Strain Interventions Other (Comment)  [Provided [employer] patient assistance application for Invokana as it is cost prohibitive]  Transportation Interventions Intervention Not Indicated       COPD / Tobacco   Last spirometry score: 82%  Gold Grade: Gold 2 (FEV1 50-79%) Current COPD Classification:  A (low sx, <2 exacerbations/yr)  Eosinophil count:   Lab Results  Component Value Date/Time    EOSPCT 2.6 08/09/2018 09:06 AM  %                               Eos (Absolute):  Lab Results  Component Value Date/Time   EOSABS 0.2 08/09/2018 09:06 AM    Tobacco Status:  Social History   Tobacco Use  Smoking Status Current Every Day Smoker  . Packs/day: 0.40  . Types: Cigarettes  Smokeless Tobacco Never Used   Patient has failed these meds in past: none  COPD is currently controlled on the following medications:   albuterol HFA, 2 puffs every 6 hours as needed  Using maintenance inhaler regularly? No- patient is not on maintenance inhaler.  Frequency of rescue inhaler use:  infrequently- only in the summer time.   We discussed:    smoking cessation and proper inhaler technique   Other coping mechanisms for stress    Plan Continue current medications.  Will reassess smoking cessation with patient at follow up.      Diabetes   Recent Relevant Labs: Lab Results  Component Value Date/Time   HGBA1C 7.4 (H) 10/24/2019 08:07 AM   HGBA1C 7.9 (H) 07/19/2019 08:05 AM   MICROALBUR 36.2 (H) 04/12/2019 07:51 AM   MICROALBUR 0.8 08/09/2018 09:06 AM     Checking BG: 2 to 3x per day  Recent FBG Readings: 100-130 mg/dl Recent HS BG readings: 157 mg/dl  Patient has failed these meds in past: Amaryl (glimepiride), Levemir, Metformin, Januvia   Patient is currently uncontrolled on the following medications:  Invokana 328m, 1 tablet once daily  Lantus, 43 units once daily at bedtime  Novolog Flexpen, 18 units with largest meal and 10 units at dinner.   Last diabetic EYE exam:  Lab Results  Component Value Date/Time   HMDIABEYEEXA No Retinopathy 11/04/2019 12:00 AM    Last diabetic FOOT exam: Patient conducts daily foot exams and states Dr. KDwyane Deeconducted foot exam about 2 years ago.   Lab Results  Component Value Date/Time   HMDIABFOOTEX done 07/12/2010 12:00 AM   We discussed: how to recognize and treat signs of hypoglycemia.  Patient denies hypoglycemia  episodes. Patient aware of treatment options. (ex. OJ).     Plan Managed by Dr. KDwyane Dee(endocrinology). Dr. KDwyane Deemodified DM regimen due to increased A1c.  Continue current medications and checking BGs at home.   Hypertension   Denies dizziness/ lightheadedness.    Office blood pressures are  BP Readings from Last 3 Encounters:  10/27/19 140/70  07/25/19 (!) 152/80  04/18/19 (!) 142/70   Patient has failed these meds in the  past: none   Patient checks BP at home 1-2x per week.   Patient home BP readings are ranging: 120/80 mmHg.   Patient controlled on:   lisinopril/HCTZ 20/12.32m, 0.5 tablets daily.   We discussed diet and exercise extensively, and reduction in soduim intake.   Plan Continue current medications and control with diet and exercise.   Hyperlipidemia   Lipid Panel     Component Value Date/Time   CHOL 112 02/03/2019 0746   TRIG 178.0 (H) 02/03/2019 0746   HDL 26.20 (L) 02/03/2019 0746   CHOLHDL 4 02/03/2019 0746   VLDL 35.6 02/03/2019 0746   LDLCALC 50 02/03/2019 0746   LDLDIRECT 54.0 11/24/2018 1336    Patient has failed these meds in past: fenofibrate, rosuvastatin   Patient is currently controlled on the following medications:   Zetia 150m 1 tablet daily  Atorvastatin 8069m1 tablet once daily.   We discussed:  diet and exercise extensively.    Plan Continue current medications and control with diet and exercise.   Osteoporosis/ vitamin D deficiency     Last DEXA Scan: 07/27/2017   T-Score femoral neck: Right: -0.9; Left: -1.2  T-Score lumbar spine: +0.4   10-year probability of major osteoporotic fracture: 8.4%  10-year probability of hip fracture: -3.7%  VITD  Date Value Ref Range Status  11/24/2018 70.06 30.00 - 100.00 ng/mL Final     Patient is not a candidate for pharmacologic treatment  Patient has failed these meds in past: Patient has failed these meds in past: Evista (hot flashes and night sweats).   Patient is  currently controlled on the following medications:   Vitamin D 1000 units daily  We discussed:  Recommend [phone] units of vitamin D daily. Recommend 1200 mg of calcium daily from dietary and supplemental sources.  Plan Continue current medications    CAD, hx of MI, CABG 2012  Patient has failed these meds in past: none Patient is currently controlled on the following medications:   ASA 325m51m tablet daily  Atorvastatin 80mg73mtablet once daily.   We discussed: higher dose of ASA. Patient states she was placed on this dose by cardiology.  Plan Managed by Dr. [facility]kins [facility]). Patient to monitor for signs and symptoms of bleeding.  Continue current medications.  Hypothyroidism   TSH  Date Value Ref Range Status  10/24/2019 2.35 0.35 - 4.50 uIU/mL Final     Patient has failed these meds in past:none  Patient is currently uncontrolled on the following medications: levothyroxine 50mcg86me daily.   We discussed:  hypo- and hyperthyroidism symptoms.   Plan  Continue current medications.  Vaccines   Reviewed and discussed patient's vaccination history.    Immunization History  Administered Date(s) Administered  . Fluad Quad(high Dose 65+) 04/18/2019  . Influenza Split 03/31/2011, 03/31/2012  . Influenza Whole 03/22/2008, 04/25/2009, 03/12/2010  . Influenza, High Dose Seasonal PF 03/16/2018  . Influenza,inj,Quad PF,6+ Mos 04/05/2013, 02/21/2014, 05/01/2015, 03/25/2016  . Pneumococcal Conjugate-13 02/03/2017  . Pneumococcal Polysaccharide-23 06/16/1998, 07/24/2009, 08/09/2018  . Td 01/30/2009  . Zoster 01/18/2014   COVID- Pfizer  1st dose: 01/19/2020   Plan Recommended patient to provide dates of COVID vaccination once she completes vaccination series.    Medication Management   Patient manages medications: uses pill box to organize medications (except Invokana). Cost barriers: denies issues.  Transportation: $100/ 30 days- for Invokana Lantus  $70/ Humalog $70  [facility]  Adherence: no gaps in refill history (per medication dispense history (  from 07/30/19 to 01/26/2020)     Follow up Follow up visit with PharmD in 3 months     Anson Crofts, PharmD, [facility] Clinical Pharmacist [facility] [place] [phone]

## 2020-01-30 ENCOUNTER — Other Ambulatory Visit: Payer: Self-pay

## 2020-01-30 MED ORDER — LISINOPRIL-HYDROCHLOROTHIAZIDE 20-12.5 MG PO TABS: 1.0000 | ORAL_TABLET | Freq: Every day | ORAL | 0 refills | Status: DC

## 2020-01-30 NOTE — Patient Instructions (Signed)
Visit Information  Goals Addressed            This Visit's Progress   . Pharmacy Care Plan       CARE PLAN ENTRY (see longitudinal plan of care for additional care plan information)  Current Barriers:  . Chronic Disease Management support, education, and care coordination needs related to Hypertension, Hyperlipidemia, Diabetes, Osteopenia, and Tobacco use   Hypertension BP Readings from Last 3 Encounters:  10/27/19 140/70  07/25/19 (!) 152/80  04/18/19 (!) 142/70   . Pharmacist Clinical Goal(s): o Over the next 180 days, patient will work with PharmD and providers to maintain BP goal <130/80 . Current regimen:  o  lisinopril/HCTZ 20/12.5mg , 0.5 tablets daily. . Interventions: o We discussed diet and exercise extensively, and reduction in soduim intake.  . Patient self care activities - Over the next 180 days, patient will: o Check BP 1 to 2 times per week, document, and provide at future appointments o Ensure daily salt intake < 2300 mg/day  Hyperlipidemia Lab Results  Component Value Date/Time   LDLCALC 50 02/03/2019 07:46 AM   LDLDIRECT 54.0 11/24/2018 01:36 PM   . Pharmacist Clinical Goal(s): o Over the next 180 days, patient will work with PharmD and providers to maintain LDL goal < 70 . Current regimen:    Zetia 10mg , 1 tablet daily  Atorvastatin 80mg , 1 tablet once daily.  . Patient self care activities - Over the next 180 days, patient will: o Continue current medications as directed by provider   Diabetes Lab Results  Component Value Date/Time   HGBA1C 7.4 (H) 10/24/2019 08:07 AM   HGBA1C 7.9 (H) 07/19/2019 08:05 AM   . Pharmacist Clinical Goal(s): o Over the next 180 days, patient will work with PharmD and providers to achieve A1c goal <7% or as directed by endocrinologist.  . Current regimen:   Invokana 300mg , 1 tablet once daily  Lantus, 43 units once daily at bedtime  Novolog Flexpen, 18 units with largest meal and 10 units at dinner.   . Interventions: o We discussed: how to recognize and treat signs of hypoglycemia.  . Patient self care activities - Over the next 180 days, patient will: o Check blood sugar twice daily, document, and provide at future appointments o Contact provider with any episodes of hypoglycemia  Osteopenia  Last vitamin D Lab Results  Component Value Date   VD25OH 70.06 11/24/2018 .  Pharmacist Clinical Goal(s) o Over the next 180 days, patient will work with PharmD and providers to maintain vitamin D levels: at or above 30 and decrease risk for bone fractures. . Current regimen:  o Vitamin D 1000 units daily . Interventions: o Recommend [phone] units of vitamin D daily. Recommend 1200 mg of calcium daily from dietary and supplemental sources . Patient self care activities - Over the next 180 days, patient will: o Continue current medications as directed by provider   Tobacco use . Pharmacist Clinical Goal(s) o Over the next 180 days, patient will work with PharmD and providers to decrease number of cigarettes smoked per day.  . Current regimen:  o No medications  . Interventions: o Provided contact information for [place] Quit Line (1-800-QUIT-NOW) and encouraged patient to reach out to this group for support. . Patient self care activities o Patient will continue working on smoking cessation and follow up with providers to reach goals.   Medication management . Pharmacist Clinical Goal(s): o Over the next 180 days, patient will work with PharmD and providers  to maintain optimal medication adherence . [facility]  . Interventions o Comprehensive medication review performed. o Continue current medication management strategy . Patient self care activities - Over the next 180 days, patient will: o Take medications as prescribed o Report any questions or concerns to PharmD and/or provider(s)  Please see past updates related to this goal by clicking on the "Past Updates" button in  the selected goal         Patient verbalizes understanding of instructions provided today.   The pharmacy team will reach out to the patient again over the next 30 days.   Anson Crofts, PharmD, BCACP Clinical Pharmacist [facility] [place] [phone]

## 2020-01-31 ENCOUNTER — Other Ambulatory Visit (INDEPENDENT_AMBULATORY_CARE_PROVIDER_SITE_OTHER): Payer: Medicare HMO

## 2020-01-31 ENCOUNTER — Other Ambulatory Visit: Payer: Self-pay

## 2020-01-31 DIAGNOSIS — Z794 Long term (current) use of insulin: Secondary | ICD-10-CM

## 2020-01-31 DIAGNOSIS — E782 Mixed hyperlipidemia: Secondary | ICD-10-CM

## 2020-01-31 DIAGNOSIS — E1165 Type 2 diabetes mellitus with hyperglycemia: Secondary | ICD-10-CM | POA: Diagnosis not present

## 2020-01-31 DIAGNOSIS — E063 Autoimmune thyroiditis: Secondary | ICD-10-CM | POA: Diagnosis not present

## 2020-01-31 LAB — HEMOGLOBIN A1C: Hgb A1c MFr Bld: 7.6 % — ABNORMAL HIGH (ref 4.6–6.5)

## 2020-01-31 LAB — COMPREHENSIVE METABOLIC PANEL
ALT: 17 U/L (ref 0–35)
AST: 17 U/L (ref 0–37)
Albumin: 4.3 g/dL (ref 3.5–5.2)
Alkaline Phosphatase: 80 U/L (ref 39–117)
BUN: 15 mg/dL (ref 6–23)
CO2: 28 mEq/L (ref 19–32)
Calcium: 10 mg/dL (ref 8.4–10.5)
Chloride: 102 mEq/L (ref 96–112)
Creatinine, Ser: 0.86 mg/dL (ref 0.40–1.20)
GFR: 65.6 mL/min (ref >60.00)
Glucose, Bld: 150 mg/dL — ABNORMAL HIGH (ref 70–99)
Potassium: 4.2 mEq/L (ref 3.5–5.1)
Sodium: 137 mEq/L (ref 135–145)
Total Bilirubin: 0.6 mg/dL (ref 0.2–1.2)
Total Protein: 7.2 g/dL (ref 6.0–8.3)

## 2020-01-31 LAB — MICROALBUMIN / CREATININE URINE RATIO
Creatinine,U: 77.5 mg/dL
Microalb Creat Ratio: 45.8 mg/g — ABNORMAL HIGH (ref 0.0–30.0)
Microalb, Ur: 35.5 mg/dL — ABNORMAL HIGH (ref 0.0–1.9)

## 2020-01-31 LAB — LIPID PANEL
Cholesterol: 110 mg/dL (ref 0–200)
HDL: 23.8 mg/dL — ABNORMAL LOW (ref >39.00)
LDL Cholesterol: 51 mg/dL (ref 0–99)
NonHDL: 86.31
Total CHOL/HDL Ratio: 5
Triglycerides: 176 mg/dL — ABNORMAL HIGH (ref 0.0–149.0)
VLDL: 35.2 mg/dL (ref 0.0–40.0)

## 2020-01-31 LAB — T4, FREE: Free T4: 1.11 ng/dL (ref 0.60–1.60)

## 2020-01-31 LAB — TSH: TSH: 3.28 u[IU]/mL (ref 0.35–4.50)

## 2020-02-03 ENCOUNTER — Ambulatory Visit: Payer: Medicare HMO | Admitting: Endocrinology

## 2020-02-03 ENCOUNTER — Encounter: Payer: Self-pay | Admitting: Endocrinology

## 2020-02-03 ENCOUNTER — Other Ambulatory Visit: Payer: Self-pay

## 2020-02-03 VITALS — BP 140/74 | HR 83 | Ht 63.0 in | Wt 162.4 lb

## 2020-02-03 DIAGNOSIS — Z794 Long term (current) use of insulin: Secondary | ICD-10-CM | POA: Diagnosis not present

## 2020-02-03 DIAGNOSIS — E1129 Type 2 diabetes mellitus with other diabetic kidney complication: Secondary | ICD-10-CM | POA: Diagnosis not present

## 2020-02-03 DIAGNOSIS — R809 Proteinuria, unspecified: Secondary | ICD-10-CM

## 2020-02-03 DIAGNOSIS — E1165 Type 2 diabetes mellitus with hyperglycemia: Secondary | ICD-10-CM | POA: Diagnosis not present

## 2020-02-03 DIAGNOSIS — I10 Essential (primary) hypertension: Secondary | ICD-10-CM | POA: Diagnosis not present

## 2020-02-03 MED ORDER — LISINOPRIL 20 MG PO TABS: 20.0000 mg | ORAL_TABLET | Freq: Every day | ORAL | 3 refills | Status: DC

## 2020-02-03 NOTE — Progress Notes (Signed)
Patient ID: Kimberly [name], female   DOB: Nov 07, 1950, 69 y.o.   MRN: [id number]           Reason for Appointment: Endocrinology follow-up    History of Present Illness:          Date of diagnosis of type 2 diabetes mellitus: 1995        Background history:   For several years she had been tried on oral agents with variable control.  Detailed history not available She thinks that she had nausea and vomiting with metformin and could not take much Also had been tried on Amaryl  She thinks she had swelling with Januvia and not clear what other medications she has tried before  She was started on insulin in 07/2013 when her A1c was over 11% Because of her poor control and A1c of 9.1 she was started on Invokana 100 mg daily in 12/16  Recent history:   INSULIN regimen is: 43 Lantus hs   Novolog 15  units before lunch. Novolog 10 acs    Non-insulin hypoglycemic drugs the patient is taking are:  Invokana 300 mg daily  Her A1c is again higher at 7.6 This previously has been as low as 6.7   Current blood sugar patterns, management and problems identified:  She did bring her monitor  Lantus was increased to 43 units on her last visit  Although she has a relatively new Accu-Chek meter her blood sugars are not high as expected from her A1c  However usually not checking sugars after dinner but mostly before eating  FASTING blood sugars are averaging about 120 and likely better than before but on her last visit she did not have a monitor  She is usually eating mostly 2 meals a day with larger lunch  Postprandial readings after lunch are averaging fairly good with only occasional readings above 160  She tries to walk indoors for exercise  Weight is about the same  No hypoglycemia with lowest reading 66 before dinnertime  She is quite consistent with taking her insulin injections and glucose monitoring       Side effects from medications have been: Nausea from metformin,?  Swelling  from Januvia  Glucose monitoring:  done 1-2 times a day         Glucometer:  Accu-Chek       Blood Glucose readings from    PRE-MEAL Fasting Lunch Dinner Bedtime Overall  Glucose range:  103-141   66-135   66-181  Mean/median:  123   101   118   POST-MEAL PC Breakfast PC Lunch PC Dinner  Glucose range:   106-181   Mean/median:   131    Previous readings:  PRE-MEAL Fasting Lunch Dinner Bedtime Overall  Glucose range: 150-160      Mean/median:     ?   POST-MEAL PC Breakfast PC Lunch PC Dinner  Glucose range:  <160   Mean/median:         Self-care: The diet that the patient has been following is: tries to limit fats, breads.     Meal times are:  Lunch: 12 pm Dinner: 5-7 PM   Typical meal intake: Breakfast is minimal          Dietician visit, most recent: 09/2015                Weight history:  Wt Readings from Last 3 Encounters:  02/03/20 162 lb 6.4 oz (73.7 kg)  10/27/19 162 lb 12.8 oz (  73.8 kg)  07/25/19 163 lb 12.8 oz (74.3 kg)    Glycemic control:   Lab Results  Component Value Date   HGBA1C 7.6 (H) 01/31/2020   HGBA1C 7.4 (H) 10/24/2019   HGBA1C 7.9 (H) 07/19/2019   Lab Results  Component Value Date   MICROALBUR 35.5 (H) 01/31/2020   LDLCALC 51 01/31/2020   CREATININE 0.86 01/31/2020       Allergies as of 02/03/2020      Reactions   Metformin And Related Nausea And Vomiting   After one pill at hospital   Januvia [sitagliptin Phosphate] Other (See Comments)   Swelling and edema see 4 13 OV      Medication List       Accurate as of February 03, 2020  8:40 AM. If you have any questions, ask your nurse or doctor.        Accu-Chek FastClix Lancets Misc Use as instructed to check blood sugar 4 times daily.   Accu-Chek Guide w/Device Kit 1 each by Does not apply route 4 (four) times daily. Use Accu Chek to check blood sugar 4 times daily.   albuterol 108 (90 Base) MCG/ACT inhaler Commonly known as: VENTOLIN HFA Inhale 2 puffs into the lungs  every 6 (six) hours as needed. For shortness of breath or wheezing   aspirin 325 MG EC tablet Take 325 mg by mouth every morning.   atorvastatin 80 MG tablet Commonly known as: LIPITOR TAKE 1 TABLET BY MOUTH EVERY DAY   BD Pen Needle Nano U/F 32G X 4 MM Misc Generic drug: Insulin Pen Needle Use as instructed to inject 3 insulin 3 times daily.   ezetimibe 10 MG tablet Commonly known as: ZETIA TAKE 1 TABLET BY MOUTH EVERY DAY   glucose blood test strip Use Accu Check Guide test strips as instructed to check blood sugar 4 times daily.   Invokana 300 MG Tabs tablet Generic drug: canagliflozin TAKE 1 TABLET (300 MG TOTAL) BY MOUTH DAILY BEFORE BREAKFAST.   Lantus SoloStar 100 UNIT/ML Solostar Pen Generic drug: insulin glargine INJECT 43 units once daily   levothyroxine 50 MCG tablet Commonly known as: SYNTHROID Take 1 tablet (50 mcg total) by mouth daily.   lisinopril-hydrochlorothiazide 20-12.5 MG tablet Commonly known as: ZESTORETIC Take 1 tablet by mouth daily. Increase to 1 po qd as directed   NovoLOG FlexPen 100 UNIT/ML FlexPen Generic drug: insulin aspart INJECT 18 UNITS UNDER THE SKIN AT LARGEST MEAL AND 10 UNITS AT DINNER       Allergies:  Allergies  Allergen Reactions  . Metformin And Related Nausea And Vomiting    After one pill at hospital  . Januvia [Sitagliptin Phosphate] Other (See Comments)    Swelling and edema see 4 13 OV    Past Medical History:  Diagnosis Date  . CAD (coronary artery disease)   . Colitis   . Colitis, indeterminate 01/31/2012  . COPD (chronic obstructive pulmonary disease) ([place])   . Depression 05/16/2017  . Depression with anxiety 05/30/2016  . Diverticulitis 02/10/2012  . DM (diabetes mellitus) ([place])   . Glaucoma   . Glaucoma   . History of ventricular fibrillation   . HTN (hypertension)   . Myocardial infarction ([place])    12/1998, 01/2011  . Osteoporosis   . PVD (peripheral vascular disease) ([place])     Past Surgical  History:  Procedure Laterality Date  . ABDOMINAL HYSTERECTOMY     endometriosis  . CABG x 3  01/22/2011   Dr Cyndia Bent  .  CARDIAC CATHETERIZATION  01/17/2011   Dr Arvella Merles  . LIPOMA EXCISION Left 02/08/2015   Procedure: EXCISION OF LEFT THIGH LIPOMA;  Surgeon: Greer Pickerel, MD;  Location: [facility];  Service: General;  Laterality: Left;  . S/P aortobifemoral bypass  2008   Dr Donnetta Hutching  . S/P myocardial infarction and PCI of the lt circumflex  2000    Family History  Problem Relation Age of Onset  . Heart attack Father   . Diabetes Father   . Heart disease Father   . Diabetes Brother   . Diabetes Mother   . Liver cancer Mother   . Hypertension Other   . Thyroid disease Other        sisters- sister had parathyroidectomy  . Alcohol abuse Other        brother  . Colon polyps Maternal Uncle   . Breast cancer Sister   . Diabetes Sister   . Thyroid disease Sister     Social History:  reports that she has been smoking cigarettes. She has been smoking about 0.40 packs per day. She has never used smokeless tobacco. She reports that she does not drink alcohol and does not use drugs.    Review of Systems    Lipid history: On 80 mg Lipitor with history of CAD Prescribed by PCP  Her LDL is last improved along with triglycerides  Lab Results  Component Value Date   CHOL 110 01/31/2020   CHOL 112 02/03/2019   CHOL 102 11/24/2018   Lab Results  Component Value Date   HDL 23.80 (L) 01/31/2020   HDL 26.20 (L) 02/03/2019   HDL 22.20 (L) 11/24/2018   Lab Results  Component Value Date   LDLCALC 51 01/31/2020   LDLCALC 50 02/03/2019   LDLCALC 87 05/20/2018   Lab Results  Component Value Date   TRIG 176.0 (H) 01/31/2020   TRIG 178.0 (H) 02/03/2019   TRIG 303.0 (H) 11/24/2018   Lab Results  Component Value Date   CHOLHDL 5 01/31/2020   CHOLHDL 4 02/03/2019   CHOLHDL 5 11/24/2018   Lab Results  Component Value Date   LDLDIRECT 54.0 11/24/2018   LDLDIRECT 87.0  04/24/2015   LDLDIRECT 150.3 07/19/2013            Hypertension: Present for several years Zestoretic 20/12.5 was reduced to half a tablet with starting Invokana previously Blood pressure at home not being checked   BP Readings from Last 3 Encounters:  02/03/20 140/74  10/27/19 140/70  07/25/19 (!) 152/80    HYPERCALCEMIA: She has had periodic mild hypercalcemia since about 2010; PTH levels in the mid-upper range Calcium is upper normal and stable  Lab Results  Component Value Date   CALCIUM 10.0 01/31/2020    She is taking 1000 units of vitamin D with adequate vitamin D levels  No history of fractures  Her last bone density was in 2/19 and this showed T score of -1.2 indicating mild osteopenia at the hip, her spine also shows a relative decline  She was tried on Evista but she says it was causing her hot flashes and night sweats and she stopped taking this  Previously in 07/2015 T score -2.3 was at the wrist, this was not measured in 2019  Most recent eye exam was normal in 5/21 and she does go every year  Most recent foot exam: 07/2018 by PCP  HYPOTHYROIDISM:  She has a family history of hypothyroidism, Graves' disease and thyroid nodules  On  her visit in 11/20 she was complaining of feeling more tired as well as colder; had some weight gain also.   Baseline TSH was 6  With levothyroxine 25 mcg daily she did not feel any better and with TSH still 4.5 she was told to go up to 50 mcg in 07/2019 With a higher dose he has less fatigue  Has taken this in the morning regularly No unusual fatigue  Her TSH is now 3.3 compared to 2.3   Lab Results  Component Value Date   TSH 3.28 01/31/2020   TSH 2.35 10/24/2019   TSH 4.52 (H) 07/19/2019   FREET4 1.11 01/31/2020   FREET4 1.23 10/24/2019   FREET4 0.89 07/19/2019     Review of Systems    LABS:  Lab on 01/31/2020  Component Date Value Ref Range Status  . TSH 01/31/2020 3.28  0.35 - 4.50 uIU/mL Final  .  Free T4 01/31/2020 1.11  0.60 - 1.60 ng/dL Final   Comment: Specimens from patients who are undergoing biotin therapy and /or ingesting biotin supplements may contain high levels of biotin.  The higher biotin concentration in these specimens interferes with this Free T4 assay.  Specimens that contain high levels  of biotin may cause false high results for this Free T4 assay.  Please interpret results in light of the total clinical presentation of the patient.    . Cholesterol 01/31/2020 110  0 - 200 mg/dL Final   ATP III Classification       Desirable:  < 200 mg/dL               Borderline High:  200 - 239 mg/dL          High:  > = 240 mg/dL  . Triglycerides 01/31/2020 176.0* 0 - 149 mg/dL Final   Normal:  <150 mg/dLBorderline High:  150 - 199 mg/dL  . HDL 01/31/2020 23.80* >39.00 mg/dL Final  . VLDL 01/31/2020 35.2  0.0 - 40.0 mg/dL Final  . LDL Cholesterol 01/31/2020 51  0 - 99 mg/dL Final  . Total CHOL/HDL Ratio 01/31/2020 5   Final                  Men          Women1/2 Average Risk     3.4          3.3Average Risk          5.0          4.42X Average Risk          9.6          7.13X Average Risk          15.0          11.0                      . NonHDL 01/31/2020 86.31   Final   NOTE:  Non-HDL goal should be 30 mg/dL higher than patient's LDL goal (i.e. LDL goal of < 70 mg/dL, would have non-HDL goal of < 100 mg/dL)  . Microalb, Ur 01/31/2020 35.5* 0.0 - 1.9 mg/dL Final  . Creatinine,U 01/31/2020 77.5  mg/dL Final  . Microalb Creat Ratio 01/31/2020 45.8* 0.0 - 30.0 mg/g Final  . Sodium 01/31/2020 137  135 - 145 mEq/L Final  . Potassium 01/31/2020 4.2  3.5 - 5.1 mEq/L Final  . Chloride 01/31/2020 102  96 - 112 mEq/L Final  . CO2 01/31/2020  28  19 - 32 mEq/L Final  . Glucose, Bld 01/31/2020 150* 70 - 99 mg/dL Final  . BUN 01/31/2020 15  6 - 23 mg/dL Final  . Creatinine, Ser 01/31/2020 0.86  0.40 - 1.20 mg/dL Final  . Total Bilirubin 01/31/2020 0.6  0.2 - 1.2 mg/dL Final  . Alkaline  Phosphatase 01/31/2020 80  39 - 117 U/L Final  . AST 01/31/2020 17  0 - 37 U/L Final  . ALT 01/31/2020 17  0 - 35 U/L Final  . Total Protein 01/31/2020 7.2  6.0 - 8.3 g/dL Final  . Albumin 01/31/2020 4.3  3.5 - 5.2 g/dL Final  . GFR 01/31/2020 65.60  >60.00 mL/min Final  . Calcium 01/31/2020 10.0  8.4 - 10.5 mg/dL Final  . Hgb A1c MFr Bld 01/31/2020 7.6* 4.6 - 6.5 % Final   Glycemic Control Guidelines for People with Diabetes:Non Diabetic:  <6%Goal of Therapy: <7%Additional Action Suggested:  >8%     Physical Examination:  BP 140/74 (BP Location: Left Arm, Patient Position: Sitting, Cuff Size: Normal)   Pulse 83   Ht 5' 3" (1.6 m)   Wt 162 lb 6.4 oz (73.7 kg)   SpO2 95%   BMI 28.77 kg/m     ASSESSMENT:  Diabetes type 2, mildly obese  See history of present illness for detailed discussion of his current management, blood sugar patterns and problems identified  A1c is 7.6  She is on basal bolus insulin along with Invokana 300 mg  Although her blood sugars at home are looking excellent and averaging only 118 including after her main meal not clear why her A1c has gone up She is usually fairly consistent with her daily regimen and diet Also trying to walk for exercise Currently not checking blood sugars after dinner and usually not eating breakfast  A1c has been as low as 6.7 in the past  MICROALBUMINURIA: This is a little more prominent now  HYPOTHYROIDISM: Controlled with 50 mcg levothyroxine her TSH is consistently normal  Hypertension: Systolic readings 654, currently not checking at home  She is hesitating about getting her second Covid vaccine because of other people telling her not to  PLAN:   Continue with 300 mg Invokana No change in Lantus or NovoLog Check blood sugars after dinner and not before eating Call if having consistently high readings  To benefit from higher dose of ACE inhibitor for her albuminuria we will change her from the half Zestoretic to  lisinopril 20 mg daily She will get a new blood pressure meter and start checking at home Follow-up with PCP in another 10 days  Stay on 50 mcg of levothyroxine  Follow-up in 3 months for A1c again    There are no Patient Instructions on file for this visit.  [name] 02/03/2020, 8:40 AM   Note: This office note was prepared with Dragon voice recognition system technology. Any transcriptional errors that result from this process are unintentional.

## 2020-02-03 NOTE — Patient Instructions (Addendum)
Check blood sugars on waking up 4-5 days a week  Also check blood sugars about 2 hours after meals and do this after different meals by rotation  Recommended blood sugar levels on waking up are 90-130 and about 2 hours after meal is 130-160  Please bring your blood sugar monitor to each visit, thank you  Change BP med

## 2020-02-14 ENCOUNTER — Other Ambulatory Visit: Payer: Self-pay

## 2020-02-14 NOTE — Progress Notes (Signed)
Chief Complaint  Patient presents with  . Annual Exam    Doing well    HPI: Kimberly [name] 69 y.o. comes in today for Preventive Medicare exam/ wellness visit .Since last visit. No major changes  Stays out of public to avoid covid  Still tobacco hard to stop  Sees Dr  Dwyane Dee for dm   Asks for albuterol in case needed  ;No change in exercise tolerance but not walking at this time   Health Maintenance  Topic Date Due  . COLONOSCOPY  04/24/2015  . TETANUS/TDAP  01/31/2019  . FOOT EXAM  08/10/2019  . INFLUENZA VACCINE  01/15/2020  . COVID-19 Vaccine (2 - Pfizer 2-dose series) 02/09/2020  . HEMOGLOBIN A1C  08/02/2020  . OPHTHALMOLOGY EXAM  11/03/2020  . DEXA SCAN  Completed  . Hepatitis C Screening  Completed  . PNA vac Low Risk Adult  Completed   Health Maintenance Review LIFESTYLE:  Exercise:  Walking in houses     Tobacco/ETS: 1 ppd   Alcohol:  no Sugar beverages:  no Sleep:  4-8  Sweats   Some    Drug use: no HH: 1 no pets     Hearing: ok  Vision:  No limitations at present . Last eye check UTD  Safety:  Has smoke detector and wears seat belts.   No excess sun exposure. Falls: n  Memory: Felt to be good  , no concern from her or her family.  Depression: No anhedonia unusual crying or depressive symptoms  Nutrition: Eats well balanced diet; adequate calcium and vitamin D. No swallowing chewing problems.  Injury: no major injuries in the last six months.  Other healthcare providers:  Reviewed today .  Preventive parameters:  Reviewed   ADLS:   There are no problems or need for assistance  driving, feeding, obtaining food, dressing, toileting and bathing, managing money using phone. She is independent.    ROS:  GEN/ HEENT: No fever, significant weight changes sweats headaches vision problems hearing changes, CV/ PULM; No chest pain shortness of breath cough, syncope,edema  change in exercise tolerance. GI /GU: No adominal pain, vomiting, change in bowel  habits. No blood in the stool. No significant GU symptoms. SKIN/HEME: ,no acute skin rashes suspicious lesions or bleeding. No lymphadenopathy, nodules, masses.  NEURO/ PSYCH:  No neurologic signs such as weakness numbness. No depression anxiety. IMM/ Allergy: No unusual infections.  Allergy .   REST of 12 system review negative except as per HPI   Past Medical History:  Diagnosis Date  . CAD (coronary artery disease)   . Colitis   . Colitis, indeterminate 01/31/2012  . COPD (chronic obstructive pulmonary disease) ([place])   . Depression 05/16/2017  . Depression with anxiety 05/30/2016  . Diverticulitis 02/10/2012  . DM (diabetes mellitus) ([place])   . Glaucoma   . Glaucoma   . History of ventricular fibrillation   . HTN (hypertension)   . Myocardial infarction ([place])    12/1998, 01/2011  . Osteoporosis   . PVD (peripheral vascular disease) ([facility])     Family History  Problem Relation Age of Onset  . Heart attack Father   . Diabetes Father   . Heart disease Father   . Diabetes Brother   . Diabetes Mother   . Liver cancer Mother   . Hypertension Other   . Thyroid disease Other        sisters- sister had parathyroidectomy  . Alcohol abuse Other  brother  . Colon polyps Maternal Uncle   . Breast cancer Sister   . Diabetes Sister   . Thyroid disease Sister     Social History   Socioeconomic History  . Marital status: Married    Spouse name: Not on file  . Number of children: 3  . Years of education: Not on file  . Highest education level: Not on file  Occupational History  . Occupation: housewife    Employer: UNEMPLOYED  Tobacco Use  . Smoking status: Current Every Day Smoker    Packs/day: 0.40    Types: Cigarettes  . Smokeless tobacco: Never Used  Vaping Use  . Vaping Use: Former  Substance and Sexual Activity  . Alcohol use: No  . Drug use: No  . Sexual activity: Not on file  Other Topics Concern  . Not on file  Social History Narrative   t widowed     Regular exercise- yes not recently       Quit tobacco in August at heart surgery. Restarted    Husband diagnosed with liver cancer since 2014 passed April 2015    She was care taker.   No pets   Social Determinants of Health   Financial Resource Strain: Medium Risk  . Difficulty of Paying Living Expenses: Somewhat hard  Food Insecurity:   . Worried About Charity fundraiser in the Last Year: Not on file  . Ran Out of Food in the Last Year: Not on file  Transportation Needs: No Transportation Needs  . Lack of Transportation (Medical): No  . Lack of Transportation (Non-Medical): No  Physical Activity:   . Days of Exercise per Week: Not on file  . Minutes of Exercise per Session: Not on file  Stress:   . Feeling of Stress : Not on file  Social Connections:   . Frequency of Communication with Friends and Family: Not on file  . Frequency of Social Gatherings with Friends and Family: Not on file  . Attends Religious Services: Not on file  . Active Member of Clubs or Organizations: Not on file  . Attends Archivist Meetings: Not on file  . Marital Status: Not on file    Outpatient Encounter Medications as of 02/15/2020  Medication Sig  . Accu-Chek FastClix Lancets MISC Use as instructed to check blood sugar 4 times daily.  Marland Kitchen albuterol (VENTOLIN HFA) 108 (90 Base) MCG/ACT inhaler Inhale 2 puffs into the lungs every 6 (six) hours as needed. For shortness of breath or wheezing  . aspirin 325 MG EC tablet Take 325 mg by mouth every morning.   Marland Kitchen atorvastatin (LIPITOR) 80 MG tablet TAKE 1 TABLET BY MOUTH EVERY DAY  . Blood Glucose Monitoring Suppl (ACCU-CHEK GUIDE) w/Device KIT 1 each by Does not apply route 4 (four) times daily. Use Accu Chek to check blood sugar 4 times daily.  Marland Kitchen ezetimibe (ZETIA) 10 MG tablet TAKE 1 TABLET BY MOUTH EVERY DAY  . glucose blood test strip Use Accu Check Guide test strips as instructed to check blood sugar 4 times daily.  . insulin glargine (LANTUS  SOLOSTAR) 100 UNIT/ML Solostar Pen INJECT 43 units once daily  . Insulin Pen Needle (BD PEN NEEDLE NANO U/F) 32G X 4 MM MISC Use as instructed to inject 3 insulin 3 times daily.  . INVOKANA 300 MG TABS tablet TAKE 1 TABLET (300 MG TOTAL) BY MOUTH DAILY BEFORE BREAKFAST.  Marland Kitchen levothyroxine (SYNTHROID) 50 MCG tablet Take 1 tablet (50 mcg total)  by mouth daily.  Marland Kitchen lisinopril (ZESTRIL) 20 MG tablet Take 1 tablet (20 mg total) by mouth daily.  Marland Kitchen lisinopril-hydrochlorothiazide (ZESTORETIC) 20-12.5 MG tablet Take 1 tablet by mouth daily. Increase to 1 po qd as directed  . NOVOLOG FLEXPEN 100 UNIT/ML FlexPen INJECT 18 UNITS UNDER THE SKIN AT LARGEST MEAL AND 10 UNITS AT DINNER  . [DISCONTINUED] albuterol (PROVENTIL HFA;VENTOLIN HFA) 108 (90 Base) MCG/ACT inhaler Inhale 2 puffs into the lungs every 6 (six) hours as needed. For shortness of breath or wheezing   No facility-administered encounter medications on file as of 02/15/2020.    EXAM:  BP 128/68   Pulse 88   Temp 98.3 F (36.8 C) (Oral)   Ht 5' 3.5" (1.613 m)   Wt 162 lb 9.6 oz (73.8 kg)   SpO2 96%   BMI 28.35 kg/m   Body mass index is 28.35 kg/m.  Physical Exam: Vital signs reviewed XLK:GMWN is a well-developed well-nourished alert cooperative   who appears stated age in no acute distress.  HEENT: normocephalic atraumatic , Eyes: PERRL EOM's full, conjunctiva clear, Nares: paten,t no deformity discharge or tenderness., Ears: no deformity EAC's clear TMs with normal landmarks. Mouth:masked  NECK: supple without masses, thyromegaly or bruits. CHEST/PULM:  Clear to auscultation and percussion breath sounds equal no wheeze , rales or rhonchi. No chest Cuffie deformities or tenderness. Breast: normal by inspection . No dimpling, discharge, masses, tenderness or discharge . CV: PMI is nondisplaced, S1 S2 no gallops, murmurs, rubs. Peripheral pulses are present  without delay.No JVD .  ABDOMEN: Bowel sounds normal nontender  No guard or rebound,  no hepato splenomegal no CVA tenderness.   Extremtities:  No clubbing cyanosis or edema, no acute joint swelling or redness no focal atrophy left lateral corn 5 mt area  NEURO:  Oriented x3, cranial nerves 3-12 appear to be intact, no obvious focal weakness,gait within normal limits no abnormal reflexes or asymmetrical SKIN: No acute rashes normal turgor, color, no bruising or petechiae. PSYCH: Oriented, good eye contact, no obvious depression anxiety, cognition and judgment appear normal. LN: no cervical axillary inguinal adenopathy No noted deficits in memory, attention, and speech. Diabetic Foot Exam - Simple   Simple Foot Form Visual Inspection No deformities, no ulcerations, no other skin breakdown bilaterally: Yes See comments: Yes Sensation Testing Intact to touch and monofilament testing bilaterally: Yes Pulse Check Posterior Tibialis and Dorsalis pulse intact bilaterally: Yes Comments Left foot  Large corn callous  Under left 5th metatarsal  no pain  Or ulcer  Toes  Shiny but no ulcer or callous       Lab Results  Component Value Date   WBC 6.7 08/09/2018   HGB 16.7 (H) 08/09/2018   HCT 48.8 (H) 08/09/2018   PLT 176.0 08/09/2018   GLUCOSE 150 (H) 01/31/2020   CHOL 110 01/31/2020   TRIG 176.0 (H) 01/31/2020   HDL 23.80 (L) 01/31/2020   LDLDIRECT 54.0 11/24/2018   LDLCALC 51 01/31/2020   ALT 17 01/31/2020   AST 17 01/31/2020   NA 137 01/31/2020   K 4.2 01/31/2020   CL 102 01/31/2020   CREATININE 0.86 01/31/2020   BUN 15 01/31/2020   CO2 28 01/31/2020   TSH 3.28 01/31/2020   INR 1.06 02/01/2012   HGBA1C 7.6 (H) 01/31/2020   MICROALBUR 35.5 (H) 01/31/2020    ASSESSMENT AND PLAN:  Discussed the following assessment and plan:  Visit for preventive health examination  Medication management  Tobacco use  Diabetes mellitus  with peripheral vascular disease ([place])  Hyperlipidemia associated with type 2 diabetes mellitus ([place])  Essential  hypertension  Callus of foot  Polycythemia - felt to be secondary to tobacco Over due for cbcdiff  Taking I monitor to compare to  Office monitor   To dr Dwyane Dee  Visits  Patient Care Team: Burnis Medin, MD as PCP - General Bartle, Fernande Boyden, MD (Cardiothoracic Surgery) Early, Arvilla Meres, MD (Thoracic Diseases) [name], MD as Consulting Physician (Endocrinology) Earnie Larsson, [facility] as Pharmacist (Pharmacist)  Patient Instructions   Get podiatry appt  For the corn callous left foot as discussed .   Still  Advise  completing the vaccine series.   Will send a message to dr Dwyane Dee  About getting cbc  At next blood check .  Bring in your monitor to your next visit  Dr K office   Still want you to stop tobacco .    Health Maintenance, Female Adopting a healthy lifestyle and getting preventive care are important in promoting health and wellness. Ask your health care provider about:  The right schedule for you to have regular tests and exams.  Things you can do on your own to prevent diseases and keep yourself healthy. What should I know about diet, weight, and exercise? Eat a healthy diet   Eat a diet that includes plenty of vegetables, fruits, low-fat dairy products, and lean protein.  Do not eat a lot of foods that are high in solid fats, added sugars, or sodium. Maintain a healthy weight Body mass index (BMI) is used to identify weight problems. It estimates body fat based on height and weight. Your health care provider can help determine your BMI and help you achieve or maintain a healthy weight. Get regular exercise Get regular exercise. This is one of the most important things you can do for your health. Most adults should:  Exercise for at least 150 minutes each week. The exercise should increase your heart rate and make you sweat (moderate-intensity exercise).  Do strengthening exercises at least twice a week. This is in addition to the moderate-intensity  exercise.  Spend less time sitting. Even light physical activity can be beneficial. Watch cholesterol and blood lipids Have your blood tested for lipids and cholesterol at 68 years of age, then have this test every 5 years. Have your cholesterol levels checked more often if:  Your lipid or cholesterol levels are high.  You are older than 68 years of age.  You are at high risk for heart disease. What should I know about cancer screening? Depending on your health history and family history, you may need to have cancer screening at various ages. This may include screening for:  Breast cancer.  Cervical cancer.  Colorectal cancer.  Skin cancer.  Lung cancer. What should I know about heart disease, diabetes, and high blood pressure? Blood pressure and heart disease  High blood pressure causes heart disease and increases the risk of stroke. This is more likely to develop in people who have high blood pressure readings, are of African descent, or are overweight.  Have your blood pressure checked: ? Every 3-5 years if you are 60-79 years of age. ? Every year if you are 22 years old or older. Diabetes Have regular diabetes screenings. This checks your fasting blood sugar level. Have the screening done:  Once every three years after age 76 if you are at a normal weight and have a low risk for diabetes.  More often  and at a younger age if you are overweight or have a high risk for diabetes. What should I know about preventing infection? Hepatitis B If you have a higher risk for hepatitis B, you should be screened for this virus. Talk with your health care provider to find out if you are at risk for hepatitis B infection. Hepatitis C Testing is recommended for:  Everyone born from [birth year] through 1964.  Anyone with known risk factors for hepatitis C. Sexually transmitted infections (STIs)  Get screened for STIs, including gonorrhea and chlamydia, if: ? You are sexually active and  are younger than 68 years of age. ? You are older than 68 years of age and your health care provider tells you that you are at risk for this type of infection. ? Your sexual activity has changed since you were last screened, and you are at increased risk for chlamydia or gonorrhea. Ask your health care provider if you are at risk.  Ask your health care provider about whether you are at high risk for HIV. Your health care provider may recommend a prescription medicine to help prevent HIV infection. If you choose to take medicine to prevent HIV, you should first get tested for HIV. You should then be tested every 3 months for as long as you are taking the medicine. Pregnancy  If you are about to stop having your period (premenopausal) and you may become pregnant, seek counseling before you get pregnant.  Take 400 to 800 micrograms (mcg) of folic acid every day if you become pregnant.  Ask for birth control (contraception) if you want to prevent pregnancy. Osteoporosis and menopause Osteoporosis is a disease in which the bones lose minerals and strength with aging. This can result in bone fractures. If you are 13 years old or older, or if you are at risk for osteoporosis and fractures, ask your health care provider if you should:  Be screened for bone loss.  Take a calcium or vitamin D supplement to lower your risk of fractures.  Be given hormone replacement therapy (HRT) to treat symptoms of menopause. Follow these instructions at home: Lifestyle  Do not use any products that contain nicotine or tobacco, such as cigarettes, e-cigarettes, and chewing tobacco. If you need help quitting, ask your health care provider.  Do not use street drugs.  Do not share needles.  Ask your health care provider for help if you need support or information about quitting drugs. Alcohol use  Do not drink alcohol if: ? Your health care provider tells you not to drink. ? You are pregnant, may be pregnant,  or are planning to become pregnant.  If you drink alcohol: ? Limit how much you use to 0-1 drink a day. ? Limit intake if you are breastfeeding.  Be aware of how much alcohol is in your drink. In the [place] one drink equals one 12 oz bottle of beer (355 mL), one 5 oz glass of wine (148 mL), or one 1 oz glass of hard liquor (44 mL). General instructions  Schedule regular health, dental, and eye exams.  Stay current with your vaccines.  Tell your health care provider if: ? You often feel depressed. ? You have ever been abused or do not feel safe at home. Summary  Adopting a healthy lifestyle and getting preventive care are important in promoting health and wellness.  Follow your health care provider's instructions about healthy diet, exercising, and getting tested or screened for diseases.  Follow your health  care provider's instructions on monitoring your cholesterol and blood pressure. This information is not intended to replace advice given to you by your health care provider. Make sure you discuss any questions you have with your health care provider. Document Revised: 05/26/2018 Document Reviewed: 05/26/2018 Elsevier Patient Education  2020 [place]sh Coates M.D.

## 2020-02-15 ENCOUNTER — Encounter: Payer: Self-pay | Admitting: Internal Medicine

## 2020-02-15 ENCOUNTER — Ambulatory Visit (INDEPENDENT_AMBULATORY_CARE_PROVIDER_SITE_OTHER): Payer: Medicare HMO | Admitting: Internal Medicine

## 2020-02-15 VITALS — BP 128/68 | HR 88 | Temp 98.3°F | Ht 63.5 in | Wt 162.6 lb

## 2020-02-15 DIAGNOSIS — Z79899 Other long term (current) drug therapy: Secondary | ICD-10-CM | POA: Diagnosis not present

## 2020-02-15 DIAGNOSIS — E785 Hyperlipidemia, unspecified: Secondary | ICD-10-CM | POA: Diagnosis not present

## 2020-02-15 DIAGNOSIS — E1151 Type 2 diabetes mellitus with diabetic peripheral angiopathy without gangrene: Secondary | ICD-10-CM

## 2020-02-15 DIAGNOSIS — Z0000 Encounter for general adult medical examination without abnormal findings: Secondary | ICD-10-CM

## 2020-02-15 DIAGNOSIS — I10 Essential (primary) hypertension: Secondary | ICD-10-CM | POA: Diagnosis not present

## 2020-02-15 DIAGNOSIS — E1169 Type 2 diabetes mellitus with other specified complication: Secondary | ICD-10-CM | POA: Diagnosis not present

## 2020-02-15 DIAGNOSIS — Z720 Tobacco use: Secondary | ICD-10-CM | POA: Diagnosis not present

## 2020-02-15 DIAGNOSIS — L84 Corns and callosities: Secondary | ICD-10-CM

## 2020-02-15 DIAGNOSIS — D751 Secondary polycythemia: Secondary | ICD-10-CM

## 2020-02-15 MED ORDER — ALBUTEROL SULFATE HFA 108 (90 BASE) MCG/ACT IN AERS: 2.0000 | INHALATION_SPRAY | Freq: Four times a day (QID) | RESPIRATORY_TRACT | 2 refills | Status: DC | PRN

## 2020-02-15 NOTE — Patient Instructions (Signed)
Get podiatry appt  For the corn callous left foot as discussed .   Still  Advise  completing the vaccine series.   Will send a message to dr Dwyane Dee  About getting cbc  At next blood check .  Bring in your monitor to your next visit  Dr K office   Still want you to stop tobacco .    Health Maintenance, Female Adopting a healthy lifestyle and getting preventive care are important in promoting health and wellness. Ask your health care provider about:  The right schedule for you to have regular tests and exams.  Things you can do on your own to prevent diseases and keep yourself healthy. What should I know about diet, weight, and exercise? Eat a healthy diet   Eat a diet that includes plenty of vegetables, fruits, low-fat dairy products, and lean protein.  Do not eat a lot of foods that are high in solid fats, added sugars, or sodium. Maintain a healthy weight Body mass index (BMI) is used to identify weight problems. It estimates body fat based on height and weight. Your health care provider can help determine your BMI and help you achieve or maintain a healthy weight. Get regular exercise Get regular exercise. This is one of the most important things you can do for your health. Most adults should:  Exercise for at least 150 minutes each week. The exercise should increase your heart rate and make you sweat (moderate-intensity exercise).  Do strengthening exercises at least twice a week. This is in addition to the moderate-intensity exercise.  Spend less time sitting. Even light physical activity can be beneficial. Watch cholesterol and blood lipids Have your blood tested for lipids and cholesterol at 68 years of age, then have this test every 5 years. Have your cholesterol levels checked more often if:  Your lipid or cholesterol levels are high.  You are older than 68 years of age.  You are at high risk for heart disease. What should I know about cancer screening? Depending on  your health history and family history, you may need to have cancer screening at various ages. This may include screening for:  Breast cancer.  Cervical cancer.  Colorectal cancer.  Skin cancer.  Lung cancer. What should I know about heart disease, diabetes, and high blood pressure? Blood pressure and heart disease  High blood pressure causes heart disease and increases the risk of stroke. This is more likely to develop in people who have high blood pressure readings, are of African descent, or are overweight.  Have your blood pressure checked: ? Every 3-5 years if you are 66-10 years of age. ? Every year if you are 49 years old or older. Diabetes Have regular diabetes screenings. This checks your fasting blood sugar level. Have the screening done:  Once every three years after age 67 if you are at a normal weight and have a low risk for diabetes.  More often and at a younger age if you are overweight or have a high risk for diabetes. What should I know about preventing infection? Hepatitis B If you have a higher risk for hepatitis B, you should be screened for this virus. Talk with your health care provider to find out if you are at risk for hepatitis B infection. Hepatitis C Testing is recommended for:  Everyone born from [birth year] through 1964.  Anyone with known risk factors for hepatitis C. Sexually transmitted infections (STIs)  Get screened for STIs, including gonorrhea and chlamydia,  if: ? You are sexually active and are younger than 68 years of age. ? You are older than 68 years of age and your health care provider tells you that you are at risk for this type of infection. ? Your sexual activity has changed since you were last screened, and you are at increased risk for chlamydia or gonorrhea. Ask your health care provider if you are at risk.  Ask your health care provider about whether you are at high risk for HIV. Your health care provider may recommend a prescription  medicine to help prevent HIV infection. If you choose to take medicine to prevent HIV, you should first get tested for HIV. You should then be tested every 3 months for as long as you are taking the medicine. Pregnancy  If you are about to stop having your period (premenopausal) and you may become pregnant, seek counseling before you get pregnant.  Take 400 to 800 micrograms (mcg) of folic acid every day if you become pregnant.  Ask for birth control (contraception) if you want to prevent pregnancy. Osteoporosis and menopause Osteoporosis is a disease in which the bones lose minerals and strength with aging. This can result in bone fractures. If you are 62 years old or older, or if you are at risk for osteoporosis and fractures, ask your health care provider if you should:  Be screened for bone loss.  Take a calcium or vitamin D supplement to lower your risk of fractures.  Be given hormone replacement therapy (HRT) to treat symptoms of menopause. Follow these instructions at home: Lifestyle  Do not use any products that contain nicotine or tobacco, such as cigarettes, e-cigarettes, and chewing tobacco. If you need help quitting, ask your health care provider.  Do not use street drugs.  Do not share needles.  Ask your health care provider for help if you need support or information about quitting drugs. Alcohol use  Do not drink alcohol if: ? Your health care provider tells you not to drink. ? You are pregnant, may be pregnant, or are planning to become pregnant.  If you drink alcohol: ? Limit how much you use to 0-1 drink a day. ? Limit intake if you are breastfeeding.  Be aware of how much alcohol is in your drink. In the [place] one drink equals one 12 oz bottle of beer (355 mL), one 5 oz glass of wine (148 mL), or one 1 oz glass of hard liquor (44 mL). General instructions  Schedule regular health, dental, and eye exams.  Stay current with your vaccines.  Tell your health  care provider if: ? You often feel depressed. ? You have ever been abused or do not feel safe at home. Summary  Adopting a healthy lifestyle and getting preventive care are important in promoting health and wellness.  Follow your health care provider's instructions about healthy diet, exercising, and getting tested or screened for diseases.  Follow your health care provider's instructions on monitoring your cholesterol and blood pressure. This information is not intended to replace advice given to you by your health care provider. Make sure you discuss any questions you have with your health care provider. Document Revised: 05/26/2018 Document Reviewed: 05/26/2018 Elsevier Patient Education  2020 [employer].

## 2020-02-20 ENCOUNTER — Other Ambulatory Visit: Payer: Self-pay | Admitting: Endocrinology

## 2020-03-11 ENCOUNTER — Other Ambulatory Visit: Payer: Self-pay | Admitting: Endocrinology

## 2020-03-11 ENCOUNTER — Other Ambulatory Visit: Payer: Self-pay | Admitting: Internal Medicine

## 2020-03-20 ENCOUNTER — Other Ambulatory Visit: Payer: Self-pay

## 2020-03-20 ENCOUNTER — Ambulatory Visit (INDEPENDENT_AMBULATORY_CARE_PROVIDER_SITE_OTHER): Payer: Medicare HMO

## 2020-03-20 DIAGNOSIS — Z0000 Encounter for general adult medical examination without abnormal findings: Secondary | ICD-10-CM

## 2020-03-20 NOTE — Progress Notes (Signed)
Virtual Visit via Telephone Note  I connected with  Kimberly [name] on 03/20/20 at  3:15 PM EDT by telephone and verified that I am speaking with the correct person using two identifiers.  Medicare Annual Wellness visit completed telephonically due to Covid-19 pandemic.   Persons participating in this call: This Health Coach and this patient.   Location: Patient: Home Provider: Office   I discussed the limitations, risks, security and privacy concerns of performing an evaluation and management service by telephone and the availability of in person appointments. The patient expressed understanding and agreed to proceed.  Unable to perform video visit due to video visit attempted and failed and/or patient does not have video capability.   Some vital signs may be absent or patient reported.   Willette Brace, LPN    Subjective:   Kimberly [name] is a 69 y.o. female who presents for an Initial Medicare Annual Wellness Visit.  Review of Systems     Cardiac Risk Factors include: advanced age (>45men, >22 women);diabetes mellitus;dyslipidemia;hypertension     Objective:    There were no vitals filed for this visit. There is no height or weight on file to calculate BMI.  Advanced Directives 03/20/2020 10/11/2018 10/29/2015 09/14/2015 01/31/2012  Does Patient Have a Medical Advance Directive? No Yes No No Patient does not have advance directive;Patient would not like information  Type of Advance Directive - [place];Living will - - -  Does patient want to make changes to medical advance directive? - No - Patient declined - - -  Copy of [place] in Chart? - No - copy requested - - -  Would patient like information on creating a medical advance directive? No - Patient declined - No - patient declined information No - patient declined information -  Pre-existing out of facility DNR order (yellow form or pink MOST form) - - - - No    Current Medications  (verified) Outpatient Encounter Medications as of 03/20/2020  Medication Sig  . Accu-Chek FastClix Lancets MISC Use as instructed to check blood sugar 4 times daily.  Marland Kitchen albuterol (VENTOLIN HFA) 108 (90 Base) MCG/ACT inhaler Inhale 2 puffs into the lungs every 6 (six) hours as needed. For shortness of breath or wheezing  . aspirin 325 MG EC tablet Take 325 mg by mouth every morning.   Marland Kitchen atorvastatin (LIPITOR) 80 MG tablet TAKE 1 TABLET BY MOUTH EVERY DAY  . Blood Glucose Monitoring Suppl (ACCU-CHEK GUIDE) w/Device KIT 1 each by Does not apply route 4 (four) times daily. Use Accu Chek to check blood sugar 4 times daily.  Marland Kitchen ezetimibe (ZETIA) 10 MG tablet TAKE 1 TABLET BY MOUTH EVERY DAY  . glucose blood test strip Use Accu Check Guide test strips as instructed to check blood sugar 4 times daily.  . Insulin Pen Needle (BD PEN NEEDLE NANO U/F) 32G X 4 MM MISC Use as instructed to inject 3 insulin 3 times daily.  . INVOKANA 300 MG TABS tablet TAKE 1 TABLET BY MOUTH DAILY BEFORE BREAKFAST.  Marland Kitchen LANTUS SOLOSTAR 100 UNIT/ML Solostar Pen INJECT 43 UNITS ONCE DAILY  . levothyroxine (SYNTHROID) 50 MCG tablet Take 1 tablet (50 mcg total) by mouth daily.  Marland Kitchen lisinopril (ZESTRIL) 20 MG tablet Take 1 tablet (20 mg total) by mouth daily.  Marland Kitchen NOVOLOG FLEXPEN 100 UNIT/ML FlexPen INJECT 18 UNITS UNDER THE SKIN AT LARGEST MEAL AND 10 UNITS AT DINNER  . [DISCONTINUED] lisinopril-hydrochlorothiazide (ZESTORETIC) 20-12.5 MG tablet  Take 1 tablet by mouth daily. Increase to 1 po qd as directed (Patient not taking: Reported on 03/20/2020)   No facility-administered encounter medications on file as of 03/20/2020.    Allergies (verified) Metformin and related and Januvia [sitagliptin phosphate]   History: Past Medical History:  Diagnosis Date  . CAD (coronary artery disease)   . Colitis   . Colitis, indeterminate 01/31/2012  . COPD (chronic obstructive pulmonary disease) ([place])   . Depression 05/16/2017  . Depression with  anxiety 05/30/2016  . Diverticulitis 02/10/2012  . DM (diabetes mellitus) ([place])   . Glaucoma   . Glaucoma   . History of ventricular fibrillation   . HTN (hypertension)   . Myocardial infarction ([place])    12/1998, 01/2011  . Osteoporosis   . PVD (peripheral vascular disease) ([place])    Past Surgical History:  Procedure Laterality Date  . ABDOMINAL HYSTERECTOMY     endometriosis  . CABG x 3  01/22/2011   Dr Cyndia Bent  . CARDIAC CATHETERIZATION  01/17/2011   Dr Arvella Merles  . LIPOMA EXCISION Left 02/08/2015   Procedure: EXCISION OF LEFT THIGH LIPOMA;  Surgeon: Greer Pickerel, MD;  Location: [facility];  Service: General;  Laterality: Left;  . S/P aortobifemoral bypass  2008   Dr Donnetta Hutching  . S/P myocardial infarction and PCI of the lt circumflex  2000   Family History  Problem Relation Age of Onset  . Heart attack Father   . Diabetes Father   . Heart disease Father   . Diabetes Brother   . Diabetes Mother   . Liver cancer Mother   . Hypertension Other   . Thyroid disease Other        sisters- sister had parathyroidectomy  . Alcohol abuse Other        brother  . Colon polyps Maternal Uncle   . Breast cancer Sister   . Diabetes Sister   . Thyroid disease Sister    Social History   Socioeconomic History  . Marital status: Married    Spouse name: Not on file  . Number of children: 3  . Years of education: Not on file  . Highest education level: Not on file  Occupational History  . Occupation: housewife    Employer: UNEMPLOYED  Tobacco Use  . Smoking status: Current Every Day Smoker    Packs/day: 1.00    Types: Cigarettes  . Smokeless tobacco: Never Used  Vaping Use  . Vaping Use: Former  Substance and Sexual Activity  . Alcohol use: No  . Drug use: No  . Sexual activity: Not on file  Other Topics Concern  . Not on file  Social History Narrative   t widowed    Regular exercise- yes not recently       Quit tobacco in August at heart surgery. Restarted    Husband  diagnosed with liver cancer since 2014 passed April 2015    She was care taker.   No pets   Social Determinants of Health   Financial Resource Strain: Low Risk   . Difficulty of Paying Living Expenses: Not hard at all  Food Insecurity: No Food Insecurity  . Worried About Charity fundraiser in the Last Year: Never true  . Ran Out of Food in the Last Year: Never true  Transportation Needs: No Transportation Needs  . Lack of Transportation (Medical): No  . Lack of Transportation (Non-Medical): No  Physical Activity: Insufficiently Active  . Days of Exercise per Week:  5 days  . Minutes of Exercise per Session: 10 min  Stress: No Stress Concern Present  . Feeling of Stress : Not at all  Social Connections: Socially Isolated  . Frequency of Communication with Friends and Family: More than three times a week  . Frequency of Social Gatherings with Friends and Family: Three times a week  . Attends Religious Services: Never  . Active Member of Clubs or Organizations: No  . Attends Archivist Meetings: Never  . Marital Status: Widowed    Tobacco Counseling Ready to quit: Not Answered Counseling given: Not Answered   Clinical Intake:  Pre-visit preparation completed: Yes  Pain : No/denies pain     BMI - recorded: 28.35 Nutritional Status: BMI 25 -29 Overweight Diabetes: Yes CBG done?: No Did pt. bring in CBG monitor from home?: No  How often do you need to have someone help you when you read instructions, pamphlets, or other written materials from your doctor or pharmacy?: 1 - Never  Diabetic?Yes  Interpreter Needed?: No  Information entered by :: Charlott Rakes, LPN   Activities of Daily Living In your present state of health, do you have any difficulty performing the following activities: 03/20/2020  Hearing? N  Vision? N  Difficulty concentrating or making decisions? N  Walking or climbing stairs? N  Dressing or bathing? N  Doing errands, shopping? N    Preparing Food and eating ? N  Using the Toilet? N  In the past six months, have you accidently leaked urine? N  Do you have problems with loss of bowel control? N  Managing your Medications? N  Managing your Finances? N  Housekeeping or managing your Housekeeping? N  Some recent data might be hidden    Patient Care Team: Panosh, Standley Brooking, MD as PCP - General Cyndia Bent, Fernande Boyden, MD (Cardiothoracic Surgery) Early, Arvilla Meres, MD (Thoracic Diseases) [name], MD as Consulting Physician (Endocrinology) Earnie Larsson, [facility] as Pharmacist (Pharmacist)  Indicate any recent Medical Services you may have received from other than [facility] providers in the past year (date may be approximate).     Assessment:   This is a routine wellness examination for Kimberly [name].  Hearing/Vision screen  Hearing Screening   125Hz  250Hz  500Hz  1000Hz  2000Hz  3000Hz  4000Hz  6000Hz  8000Hz   Right ear:           Left ear:           Comments: No difficulty hearing at this time  Vision Screening Comments: Follows up annually with Dr Katy Fitch for eye exams  Dietary issues and exercise activities discussed: Current Exercise Habits: The patient does not participate in regular exercise at present (doesn't go out much just walk to mail box and around house)  Goals    . COPD/ Tobacco use     Decrease amount smoking per day in the next 2 months.      . Diabetes     Achieve A1c <7.0% Continue follow up appointments with Dr. Dwyane Dee.  Obtain diabetic eye exam every 1 to 2 years.  Obtain at least once yearly foot exams.    . Hypertension     Maintain BP goal: <130/62mmHg.  Continue lisinopril/HCTZ 20/12.5mg , 0.5 tablets daily.     . none at this time     None at this time     . Pharmacy Care Plan     CARE PLAN ENTRY (see longitudinal plan of care for additional care plan information)  Current Barriers:  . Chronic Disease  Management support, education, and care coordination needs related to Hypertension,  Hyperlipidemia, Diabetes, Osteopenia, and Tobacco use   Hypertension BP Readings from Last 3 Encounters:  10/27/19 140/70  07/25/19 (!) 152/80  04/18/19 (!) 142/70   . Pharmacist Clinical Goal(s): o Over the next 180 days, patient will work with PharmD and providers to maintain BP goal <130/80 . Current regimen:  o  lisinopril/HCTZ 20/12.5mg , 0.5 tablets daily. . Interventions: o We discussed diet and exercise extensively, and reduction in soduim intake.  . Patient self care activities - Over the next 180 days, patient will: o Check BP 1 to 2 times per week, document, and provide at future appointments o Ensure daily salt intake < 2300 mg/day  Hyperlipidemia Lab Results  Component Value Date/Time   LDLCALC 50 02/03/2019 07:46 AM   LDLDIRECT 54.0 11/24/2018 01:36 PM   . Pharmacist Clinical Goal(s): o Over the next 180 days, patient will work with PharmD and providers to maintain LDL goal < 70 . Current regimen:    Zetia 10mg , 1 tablet daily  Atorvastatin 80mg , 1 tablet once daily.  . Patient self care activities - Over the next 180 days, patient will: o Continue current medications as directed by provider   Diabetes Lab Results  Component Value Date/Time   HGBA1C 7.4 (H) 10/24/2019 08:07 AM   HGBA1C 7.9 (H) 07/19/2019 08:05 AM   . Pharmacist Clinical Goal(s): o Over the next 180 days, patient will work with PharmD and providers to achieve A1c goal <7% or as directed by endocrinologist.  . Current regimen:   Invokana 300mg , 1 tablet once daily  Lantus, 43 units once daily at bedtime  Novolog Flexpen, 18 units with largest meal and 10 units at dinner.  . Interventions: o We discussed: how to recognize and treat signs of hypoglycemia.  . Patient self care activities - Over the next 180 days, patient will: o Check blood sugar twice daily, document, and provide at future appointments o Contact provider with any episodes of hypoglycemia  Osteopenia  Last vitamin  D Lab Results  Component Value Date   VD25OH 70.06 11/24/2018 .  Pharmacist Clinical Goal(s) o Over the next 180 days, patient will work with PharmD and providers to maintain vitamin D levels: at or above 30 and decrease risk for bone fractures. . Current regimen:  o Vitamin D 1000 units daily . Interventions: o Recommend [phone] units of vitamin D daily. Recommend 1200 mg of calcium daily from dietary and supplemental sources . Patient self care activities - Over the next 180 days, patient will: o Continue current medications as directed by provider   Tobacco use . Pharmacist Clinical Goal(s) o Over the next 180 days, patient will work with PharmD and providers to decrease number of cigarettes smoked per day.  . Current regimen:  o No medications  . Interventions: o Provided contact information for [place] Quit Line (1-800-QUIT-NOW) and encouraged patient to reach out to this group for support. . Patient self care activities o Patient will continue working on smoking cessation and follow up with providers to reach goals.   Medication management . Pharmacist Clinical Goal(s): o Over the next 180 days, patient will work with PharmD and providers to maintain optimal medication adherence . Current pharmacy: [facility]  . Interventions o Comprehensive medication review performed. o Continue current medication management strategy . Patient self care activities - Over the next 180 days, patient will: o Take medications as prescribed o Report any questions or concerns to PharmD and/or  provider(s)  Please see past updates related to this goal by clicking on the "Past Updates" button in the selected goal        Depression Screen PHQ 2/9 Scores 03/20/2020 02/15/2020 10/11/2018 08/09/2018 11/03/2016 10/29/2015 09/14/2015  PHQ - 2 Score 0 0 1 0 0 0 0  PHQ- 9 Score - 0 - - - - -    Fall Risk Fall Risk  03/20/2020 02/15/2020 10/11/2018 08/09/2018 11/03/2016  Falls in the past year? 0 0 1 0 No  Number falls in  past yr: 0 - 0 0 -  Injury with Fall? 0 - 1 0 -  Risk for fall due to : - - History of fall(s) - -  Follow up Falls prevention discussed - Falls evaluation completed - -    Any stairs in or around the home? Yes  If so, are there any without handrails? Yes  Home free of loose throw rugs in walkways, pet beds, electrical cords, etc? Yes  Adequate lighting in your home to reduce risk of falls? Yes   ASSISTIVE DEVICES UTILIZED TO PREVENT FALLS:  Life alert? No  Use of a cane, walker or w/c? No  Grab bars in the bathroom? Yes  Shower chair or bench in shower? Yes  Elevated toilet seat or a handicapped toilet? Yes   TIMED UP AND GO:  Was the test performed? No .      Cognitive Function:     6CIT Screen 03/20/2020  What Year? 0 points  What month? 0 points  Count back from 20 0 points  Months in reverse 0 points  Repeat phrase 2 points    Immunizations Immunization History  Administered Date(s) Administered  . Fluad Quad(high Dose 65+) 04/18/2019  . Influenza Split 03/31/2011, 03/31/2012  . Influenza Whole 03/22/2008, 04/25/2009, 03/12/2010  . Influenza, High Dose Seasonal PF 03/16/2018  . Influenza,inj,Quad PF,6+ Mos 04/05/2013, 02/21/2014, 05/01/2015, 03/25/2016  . PFIZER SARS-COV-2 Vaccination 01/19/2020  . Pneumococcal Conjugate-13 02/03/2017  . Pneumococcal Polysaccharide-23 06/16/1998, 07/24/2009, 08/09/2018  . Td 01/30/2009  . Zoster 01/18/2014    TDAP status: Due, Education has been provided regarding the importance of this vaccine. Advised may receive this vaccine at local pharmacy or Health Dept. Aware to provide a copy of the vaccination record if obtained from local pharmacy or Health Dept. Verbalized acceptance and understanding. Flu Vaccine status: Declined, Education has been provided regarding the importance of this vaccine but patient still declined. Advised may receive this vaccine at local pharmacy or Health Dept. Aware to provide a copy of the  vaccination record if obtained from local pharmacy or Health Dept. Verbalized acceptance and understanding. Pneumococcal vaccine status: Up to date Covid-19 vaccine status: Information provided on how to obtain vaccines.   Qualifies for Shingles Vaccine? Yes   Zostavax completed Yes   Shingrix Completed?: No.    Education has been provided regarding the importance of this vaccine. Patient has been advised to call insurance company to determine out of pocket expense if they have not yet received this vaccine. Advised may also receive vaccine at local pharmacy or Health Dept. Verbalized acceptance and understanding.  Screening Tests Health Maintenance  Topic Date Due  . COLONOSCOPY  04/24/2015  . TETANUS/TDAP  01/31/2019  . FOOT EXAM  08/10/2019  . INFLUENZA VACCINE  01/15/2020  . COVID-19 Vaccine (2 - Pfizer 2-dose series) 02/09/2020  . HEMOGLOBIN A1C  08/02/2020  . OPHTHALMOLOGY EXAM  11/03/2020  . DEXA SCAN  Completed  . Hepatitis C Screening  Completed  . PNA vac Low Risk Adult  Completed    Health Maintenance  Health Maintenance Due  Topic Date Due  . COLONOSCOPY  04/24/2015  . TETANUS/TDAP  01/31/2019  . FOOT EXAM  08/10/2019  . INFLUENZA VACCINE  01/15/2020  . COVID-19 Vaccine (2 - Pfizer 2-dose series) 02/09/2020    Colorectal cancer screening: No longer required. Pt declines Mammogram status: No longer required. pt declines Bone Density status: Completed 07/27/17. Results reflect: Bone density results: OSTEOPOROSIS. Repeat every 2 years.  Lung Cancer Screening: (Low Dose CT Chest recommended if Age 47-80 years, 30 pack-year currently smoking OR have quit w/in 15years.) does qualify.   Lung Cancer Screening Referral: Pt Declines  Additional Screening:  Hepatitis C Screening: Completed 07/27/17  Vision Screening: Recommended annual ophthalmology exams for early detection of glaucoma and other disorders of the eye. Is the patient up to date with their annual eye exam?   Yes  Who is the provider or what is the name of the office in which the patient attends annual eye exams? Dr Katy Fitch   Dental Screening: Recommended annual dental exams for proper oral hygiene  Community Resource Referral / Chronic Care Management: CRR required this visit?  No   CCM required this visit?  No      Plan:     I have personally reviewed and noted the following in the patient's chart:   . Medical and social history . Use of alcohol, tobacco or illicit drugs  . Current medications and supplements . Functional ability and status . Nutritional status . Physical activity . Advanced directives . List of other physicians . Hospitalizations, surgeries, and ER visits in previous 12 months . Vitals . Screenings to include cognitive, depression, and falls . Referrals and appointments  In addition, I have reviewed and discussed with patient certain preventive protocols, quality metrics, and best practice recommendations. A written personalized care plan for preventive services as well as general preventive health recommendations were provided to patient.     Willette Brace, LPN   26/08/3352   Nurse Notes: None

## 2020-03-20 NOTE — Patient Instructions (Addendum)
Ms. Flaim , Thank you for taking time to come for your Medicare Wellness Visit. I appreciate your ongoing commitment to your health goals. Please review the following plan we discussed and let me know if I can assist you in the future.   Screening recommendations/referrals: Colonoscopy: Declined Mammogram: Declined Bone Density: Done 07/27/17 Recommended yearly ophthalmology/optometry visit for glaucoma screening and checkup Recommended yearly dental visit for hygiene and checkup  Vaccinations: Influenza vaccine: Due and discussed Pneumococcal vaccine: Up to date Tdap vaccine: Due and discussed Shingles vaccine: Shingrix discussed. Please contact your pharmacy for coverage information.    Covid-19:Pt only received 1st dose 01/19/20  Advanced directives: Advance directive discussed with you today. Even though you declined this today please call our office should you change your mind and we can give you the proper paperwork for you to fill out.  Conditions/risks identified: None at this time  Next appointment: Follow up in one year for your annual wellness visit    Preventive Care 65 Years and Older, Female Preventive care refers to lifestyle choices and visits with your health care provider that can promote health and wellness. What does preventive care include?  A yearly physical exam. This is also called an annual well check.  Dental exams once or twice a year.  Routine eye exams. Ask your health care provider how often you should have your eyes checked.  Personal lifestyle choices, including:  Daily care of your teeth and gums.  Regular physical activity.  Eating a healthy diet.  Avoiding tobacco and drug use.  Limiting alcohol use.  Practicing safe sex.  Taking low-dose aspirin every day.  Taking vitamin and mineral supplements as recommended by your health care provider. What happens during an annual well check? The services and screenings done by your health care  provider during your annual well check will depend on your age, overall health, lifestyle risk factors, and family history of disease. Counseling  Your health care provider may ask you questions about your:  Alcohol use.  Tobacco use.  Drug use.  Emotional well-being.  Home and relationship well-being.  Sexual activity.  Eating habits.  History of falls.  Memory and ability to understand (cognition).  Work and work Statistician.  Reproductive health. Screening  You may have the following tests or measurements:  Height, weight, and BMI.  Blood pressure.  Lipid and cholesterol levels. These may be checked every 5 years, or more frequently if you are over 35 years old.  Skin check.  Lung cancer screening. You may have this screening every year starting at age 29 if you have a 30-pack-year history of smoking and currently smoke or have quit within the past 15 years.  Fecal occult blood test (FOBT) of the stool. You may have this test every year starting at age 45.  Flexible sigmoidoscopy or colonoscopy. You may have a sigmoidoscopy every 5 years or a colonoscopy every 10 years starting at age 78.  Hepatitis C blood test.  Hepatitis B blood test.  Sexually transmitted disease (STD) testing.  Diabetes screening. This is done by checking your blood sugar (glucose) after you have not eaten for a while (fasting). You may have this done every 1-3 years.  Bone density scan. This is done to screen for osteoporosis. You may have this done starting at age 30.  Mammogram. This may be done every 1-2 years. Talk to your health care provider about how often you should have regular mammograms. Talk with your health care provider about  your test results, treatment options, and if necessary, the need for more tests. Vaccines  Your health care provider may recommend certain vaccines, such as:  Influenza vaccine. This is recommended every year.  Tetanus, diphtheria, and acellular  pertussis (Tdap, Td) vaccine. You may need a Td booster every 10 years.  Zoster vaccine. You may need this after age 66.  Pneumococcal 13-valent conjugate (PCV13) vaccine. One dose is recommended after age 50.  Pneumococcal polysaccharide (PPSV23) vaccine. One dose is recommended after age 19. Talk to your health care provider about which screenings and vaccines you need and how often you need them. This information is not intended to replace advice given to you by your health care provider. Make sure you discuss any questions you have with your health care provider. Document Released: 06/29/2015 Document Revised: 02/20/2016 Document Reviewed: 04/03/2015 Elsevier Interactive Patient Education  2017 [place] Prevention in the Home Falls can cause injuries. They can happen to people of all ages. There are many things you can do to make your home safe and to help prevent falls. What can I do on the outside of my home?  Regularly fix the edges of walkways and driveways and fix any cracks.  Remove anything that might make you trip as you walk through a door, such as a raised step or threshold.  Trim any bushes or trees on the path to your home.  Use bright outdoor lighting.  Clear any walking paths of anything that might make someone trip, such as rocks or tools.  Regularly check to see if handrails are loose or broken. Make sure that both sides of any steps have handrails.  Any raised decks and porches should have guardrails on the edges.  Have any leaves, snow, or ice cleared regularly.  Use sand or salt on walking paths during winter.  Clean up any spills in your garage right away. This includes oil or grease spills. What can I do in the bathroom?  Use night lights.  Install grab bars by the toilet and in the tub and shower. Do not use towel bars as grab bars.  Use non-skid mats or decals in the tub or shower.  If you need to sit down in the shower, use a plastic,  non-slip stool.  Keep the floor dry. Clean up any water that spills on the floor as soon as it happens.  Remove soap buildup in the tub or shower regularly.  Attach bath mats securely with double-sided non-slip rug tape.  Do not have throw rugs and other things on the floor that can make you trip. What can I do in the bedroom?  Use night lights.  Make sure that you have a light by your bed that is easy to reach.  Do not use any sheets or blankets that are too big for your bed. They should not hang down onto the floor.  Have a firm chair that has side arms. You can use this for support while you get dressed.  Do not have throw rugs and other things on the floor that can make you trip. What can I do in the kitchen?  Clean up any spills right away.  Avoid walking on wet floors.  Keep items that you use a lot in easy-to-reach places.  If you need to reach something above you, use a strong step stool that has a grab bar.  Keep electrical cords out of the way.  Do not use floor polish or wax  that makes floors slippery. If you must use wax, use non-skid floor wax.  Do not have throw rugs and other things on the floor that can make you trip. What can I do with my stairs?  Do not leave any items on the stairs.  Make sure that there are handrails on both sides of the stairs and use them. Fix handrails that are broken or loose. Make sure that handrails are as long as the stairways.  Check any carpeting to make sure that it is firmly attached to the stairs. Fix any carpet that is loose or worn.  Avoid having throw rugs at the top or bottom of the stairs. If you do have throw rugs, attach them to the floor with carpet tape.  Make sure that you have a light switch at the top of the stairs and the bottom of the stairs. If you do not have them, ask someone to add them for you. What else can I do to help prevent falls?  Wear shoes that:  Do not have high heels.  Have rubber  bottoms.  Are comfortable and fit you well.  Are closed at the toe. Do not wear sandals.  If you use a stepladder:  Make sure that it is fully opened. Do not climb a closed stepladder.  Make sure that both sides of the stepladder are locked into place.  Ask someone to hold it for you, if possible.  Clearly mark and make sure that you can see:  Any grab bars or handrails.  First and last steps.  Where the edge of each step is.  Use tools that help you move around (mobility aids) if they are needed. These include:  Canes.  Walkers.  Scooters.  Crutches.  Turn on the lights when you go into a dark area. Replace any light bulbs as soon as they burn out.  Set up your furniture so you have a clear path. Avoid moving your furniture around.  If any of your floors are uneven, fix them.  If there are any pets around you, be aware of where they are.  Review your medicines with your doctor. Some medicines can make you feel dizzy. This can increase your chance of falling. Ask your doctor what other things that you can do to help prevent falls. This information is not intended to replace advice given to you by your health care provider. Make sure you discuss any questions you have with your health care provider. Document Released: 03/29/2009 Document Revised: 11/08/2015 Document Reviewed: 07/07/2014 Elsevier Interactive Patient Education  2017 [employer].

## 2020-04-16 ENCOUNTER — Telehealth: Payer: Self-pay | Admitting: Internal Medicine

## 2020-04-16 DIAGNOSIS — L84 Corns and callosities: Secondary | ICD-10-CM

## 2020-04-16 NOTE — Telephone Encounter (Signed)
I spoke with patient. The reason for the referral is to have a corn removed from her foot. Pt is aware that the referral has been placed.

## 2020-04-16 NOTE — Telephone Encounter (Signed)
Im ok with you doing referral but please document reason for referral

## 2020-04-16 NOTE — Telephone Encounter (Signed)
Okay to place? 

## 2020-04-16 NOTE — Telephone Encounter (Signed)
The patient wants a referral to [facility]  Please advise

## 2020-04-24 ENCOUNTER — Other Ambulatory Visit: Payer: Self-pay

## 2020-04-24 ENCOUNTER — Other Ambulatory Visit (INDEPENDENT_AMBULATORY_CARE_PROVIDER_SITE_OTHER): Payer: Medicare HMO

## 2020-04-24 DIAGNOSIS — E1129 Type 2 diabetes mellitus with other diabetic kidney complication: Secondary | ICD-10-CM

## 2020-04-24 DIAGNOSIS — E1165 Type 2 diabetes mellitus with hyperglycemia: Secondary | ICD-10-CM | POA: Diagnosis not present

## 2020-04-24 DIAGNOSIS — R809 Proteinuria, unspecified: Secondary | ICD-10-CM | POA: Diagnosis not present

## 2020-04-24 DIAGNOSIS — Z794 Long term (current) use of insulin: Secondary | ICD-10-CM

## 2020-04-24 LAB — HEMOGLOBIN A1C: Hgb A1c MFr Bld: 7.5 % — ABNORMAL HIGH (ref 4.6–6.5)

## 2020-04-24 LAB — MICROALBUMIN / CREATININE URINE RATIO
Creatinine,U: 36.5 mg/dL
Microalb Creat Ratio: 27.3 mg/g (ref 0.0–30.0)
Microalb, Ur: 10 mg/dL — ABNORMAL HIGH (ref 0.0–1.9)

## 2020-04-24 LAB — BASIC METABOLIC PANEL
BUN: 18 mg/dL (ref 6–23)
CO2: 27 mEq/L (ref 19–32)
Calcium: 10 mg/dL (ref 8.4–10.5)
Chloride: 102 mEq/L (ref 96–112)
Creatinine, Ser: 0.76 mg/dL (ref 0.40–1.20)
GFR: 80.56 mL/min (ref >60.00)
Glucose, Bld: 119 mg/dL — ABNORMAL HIGH (ref 70–99)
Potassium: 4.5 mEq/L (ref 3.5–5.1)
Sodium: 136 mEq/L (ref 135–145)

## 2020-04-27 ENCOUNTER — Other Ambulatory Visit: Payer: Self-pay | Admitting: *Deleted

## 2020-04-27 ENCOUNTER — Other Ambulatory Visit: Payer: Self-pay

## 2020-04-27 ENCOUNTER — Ambulatory Visit: Payer: Medicare HMO | Admitting: Endocrinology

## 2020-04-27 ENCOUNTER — Encounter: Payer: Self-pay | Admitting: Endocrinology

## 2020-04-27 VITALS — BP 126/78 | HR 91 | Ht 64.0 in | Wt 161.4 lb

## 2020-04-27 DIAGNOSIS — E1165 Type 2 diabetes mellitus with hyperglycemia: Secondary | ICD-10-CM

## 2020-04-27 DIAGNOSIS — R809 Proteinuria, unspecified: Secondary | ICD-10-CM

## 2020-04-27 DIAGNOSIS — E1129 Type 2 diabetes mellitus with other diabetic kidney complication: Secondary | ICD-10-CM

## 2020-04-27 DIAGNOSIS — Z23 Encounter for immunization: Secondary | ICD-10-CM | POA: Diagnosis not present

## 2020-04-27 DIAGNOSIS — I10 Essential (primary) hypertension: Secondary | ICD-10-CM | POA: Diagnosis not present

## 2020-04-27 DIAGNOSIS — Z794 Long term (current) use of insulin: Secondary | ICD-10-CM

## 2020-04-27 MED ORDER — BD PEN NEEDLE NANO U/F 32G X 4 MM MISC: 3 refills | Status: AC

## 2020-04-27 NOTE — Progress Notes (Signed)
Patient ID: Kimberly [name], female   DOB: 04/26/1951, 69 y.o.   MRN: [id number]           Reason for Appointment: Endocrinology follow-up    History of Present Illness:          Date of diagnosis of type 2 diabetes mellitus: 1995        Background history:   For several years she had been tried on oral agents with variable control.  Detailed history not available She thinks that she had nausea and vomiting with metformin and could not take much Also had been tried on Amaryl  She thinks she had swelling with Januvia and not clear what other medications she has tried before  She was started on insulin in 07/2013 when her A1c was over 11% Because of her poor control and A1c of 9.1 she was started on Invokana 100 mg daily in 12/16  Recent history:   INSULIN regimen is: 43 Lantus hs   Novolog 15  units before lunch. Novolog 10 acs    Non-insulin hypoglycemic drugs the patient is taking are:  Invokana 300 mg daily  Her A1c is about the same at 7.5 This previously has been as low as 6.7   Current blood sugar patterns, management and problems identified:  She did bring her monitor  Insulin doses were not changed on her last visit  She is checking her blood sugar rotation after meals and fasting and has only rare high blood sugars  Her largest meal is usually at lunchtime when she takes 15 units of NovoLog  Currently fasting blood sugars are fairly consistently near normal with 43 units of Lantus  Lab glucose was 119 fasting  She tries to walk either indoors or outdoors for exercise and she thinks she is walking up to 30 minutes daily  Weight is about the same  She does not feel hypoglycemic but occasionally feels cold and she thinks she is symptomatic when her blood sugars are in the 90s No change in renal function with continuing Invokana       Side effects from medications have been: Nausea from metformin,?  Swelling from Januvia  Glucose monitoring:  done 1-2 times a day          Glucometer:  Accu-Chek       Blood Glucose readings from download:   PRE-MEAL Fasting Lunch Dinner Bedtime Overall  Glucose range:  91-133      Mean/median:  120     127   POST-MEAL PC Breakfast PC Lunch PC Dinner  Glucose range:   94-153  99-189  Mean/median:      Previous readings  PRE-MEAL Fasting Lunch Dinner Bedtime Overall  Glucose range:  103-141   66-135   66-181  Mean/median:  123   101   118   POST-MEAL PC Breakfast PC Lunch PC Dinner  Glucose range:   106-181   Mean/median:   131       Self-care: The diet that the patient has been following is: tries to limit fats, breads.     Meal times are:  Lunch: 12 pm Dinner: 5-7 PM   Typical meal intake: Breakfast is minimal          Dietician visit, most recent: 09/2015                Weight history:  Wt Readings from Last 3 Encounters:  04/27/20 161 lb 6.4 oz (73.2 kg)  02/15/20 162 lb 9.6 oz (  73.8 kg)  02/03/20 162 lb 6.4 oz (73.7 kg)    Glycemic control:   Lab Results  Component Value Date   HGBA1C 7.5 (H) 04/24/2020   HGBA1C 7.6 (H) 01/31/2020   HGBA1C 7.4 (H) 10/24/2019   Lab Results  Component Value Date   MICROALBUR 10.0 (H) 04/24/2020   LDLCALC 51 01/31/2020   CREATININE 0.76 04/24/2020    Other active problems discussed today: See review of systems   Allergies as of 04/27/2020      Reactions   Metformin And Related Nausea And Vomiting   After one pill at hospital   Januvia [sitagliptin Phosphate] Other (See Comments)   Swelling and edema see 4 13 OV      Medication List       Accurate as of April 27, 2020 11:16 AM. If you have any questions, ask your nurse or doctor.        Accu-Chek FastClix Lancets Misc Use as instructed to check blood sugar 4 times daily.   Accu-Chek Guide w/Device Kit 1 each by Does not apply route 4 (four) times daily. Use Accu Chek to check blood sugar 4 times daily.   albuterol 108 (90 Base) MCG/ACT inhaler Commonly known as: VENTOLIN  HFA Inhale 2 puffs into the lungs every 6 (six) hours as needed. For shortness of breath or wheezing   aspirin 325 MG EC tablet Take 325 mg by mouth every morning.   atorvastatin 80 MG tablet Commonly known as: LIPITOR TAKE 1 TABLET BY MOUTH EVERY DAY   BD Pen Needle Nano U/F 32G X 4 MM Misc Generic drug: Insulin Pen Needle Use as instructed to inject 3 insulin 3 times daily.   ezetimibe 10 MG tablet Commonly known as: ZETIA TAKE 1 TABLET BY MOUTH EVERY DAY   glucose blood test strip Use Accu Check Guide test strips as instructed to check blood sugar 4 times daily.   Invokana 300 MG Tabs tablet Generic drug: canagliflozin TAKE 1 TABLET BY MOUTH DAILY BEFORE BREAKFAST.   Lantus SoloStar 100 UNIT/ML Solostar Pen Generic drug: insulin glargine INJECT 43 UNITS ONCE DAILY   levothyroxine 50 MCG tablet Commonly known as: SYNTHROID Take 1 tablet (50 mcg total) by mouth daily.   lisinopril 20 MG tablet Commonly known as: ZESTRIL Take 1 tablet (20 mg total) by mouth daily.   NovoLOG FlexPen 100 UNIT/ML FlexPen Generic drug: insulin aspart INJECT 18 UNITS UNDER THE SKIN AT LARGEST MEAL AND 10 UNITS AT DINNER       Allergies:  Allergies  Allergen Reactions  . Metformin And Related Nausea And Vomiting    After one pill at hospital  . Januvia [Sitagliptin Phosphate] Other (See Comments)    Swelling and edema see 4 13 OV    Past Medical History:  Diagnosis Date  . CAD (coronary artery disease)   . Colitis   . Colitis, indeterminate 01/31/2012  . COPD (chronic obstructive pulmonary disease) ([place])   . Depression 05/16/2017  . Depression with anxiety 05/30/2016  . Diverticulitis 02/10/2012  . DM (diabetes mellitus) ([place])   . Glaucoma   . Glaucoma   . History of ventricular fibrillation   . HTN (hypertension)   . Myocardial infarction ([place])    12/1998, 01/2011  . Osteoporosis   . PVD (peripheral vascular disease) ([place])     Past Surgical History:  Procedure  Laterality Date  . ABDOMINAL HYSTERECTOMY     endometriosis  . CABG x 3  01/22/2011   Dr Cyndia Bent  .  CARDIAC CATHETERIZATION  01/17/2011   Dr Arvella Merles  . LIPOMA EXCISION Left 02/08/2015   Procedure: EXCISION OF LEFT THIGH LIPOMA;  Surgeon: Greer Pickerel, MD;  Location: [place];  Service: General;  Laterality: Left;  . S/P aortobifemoral bypass  2008   Dr Donnetta Hutching  . S/P myocardial infarction and PCI of the lt circumflex  2000    Family History  Problem Relation Age of Onset  . Heart attack Father   . Diabetes Father   . Heart disease Father   . Diabetes Brother   . Diabetes Mother   . Liver cancer Mother   . Hypertension Other   . Thyroid disease Other        sisters- sister had parathyroidectomy  . Alcohol abuse Other        brother  . Colon polyps Maternal Uncle   . Breast cancer Sister   . Diabetes Sister   . Thyroid disease Sister     Social History:  reports that she has been smoking cigarettes. She has been smoking about 1.00 pack per day. She has never used smokeless tobacco. She reports that she does not drink alcohol and does not use drugs.    Review of Systems    Lipid history: On 80 mg Lipitor with history of CAD Prescribed by PCP  Her LDL is last improved with mildly increased triglycerides  Lab Results  Component Value Date   CHOL 110 01/31/2020   CHOL 112 02/03/2019   CHOL 102 11/24/2018   Lab Results  Component Value Date   HDL 23.80 (L) 01/31/2020   HDL 26.20 (L) 02/03/2019   HDL 22.20 (L) 11/24/2018   Lab Results  Component Value Date   LDLCALC 51 01/31/2020   LDLCALC 50 02/03/2019   LDLCALC 87 05/20/2018   Lab Results  Component Value Date   TRIG 176.0 (H) 01/31/2020   TRIG 178.0 (H) 02/03/2019   TRIG 303.0 (H) 11/24/2018   Lab Results  Component Value Date   CHOLHDL 5 01/31/2020   CHOLHDL 4 02/03/2019   CHOLHDL 5 11/24/2018   Lab Results  Component Value Date   LDLDIRECT 54.0 11/24/2018   LDLDIRECT 87.0 04/24/2015    LDLDIRECT 150.3 07/19/2013            Hypertension: Present for several years  Blood pressure at home: Not being checked Now taking LISINOPRIL 20 mg daily This was changed in the last visit because of microalbuminuria which is back to normal  BP Readings from Last 3 Encounters:  04/27/20 126/78  02/15/20 128/68  02/03/20 140/74    HYPERCALCEMIA: She has had periodic mild hypercalcemia since about 2010; PTH levels in the mid-upper range Calcium is upper normal and stable  Lab Results  Component Value Date   CALCIUM 10.0 04/24/2020    She is taking 1000 units of vitamin D with adequate vitamin D levels  OSTEOPENIA:  No history of fractures  Her last bone density was in 2/19 and this showed T score of -1.2 indicating mild osteopenia at the hip, her spine also shows a relative decline  She was tried on Evista but she says it was causing her hot flashes and night sweats and she stopped taking this  Previously in 07/2015 T score -2.3 was at the wrist, this was not measured in 2019  Most recent eye exam was normal in 5/21 and she does go every year  Most recent foot exam: 07/2018 by PCP  HYPOTHYROIDISM:  She has a  family history of hypothyroidism, Graves' disease and thyroid nodules  On her visit in 11/20 she was complaining of feeling more tired as well as colder; had some weight gain also.   Baseline TSH was 6  With levothyroxine 25 mcg daily she did not feel any better and with TSH still 4.5 she was told to go up to 50 mcg in 07/2019 With a higher dose he has less fatigue  Has taken this in the morning regularly No unusual fatigue  Her TSH is now 3.3 compared to 2.3   Lab Results  Component Value Date   TSH 3.28 01/31/2020   TSH 2.35 10/24/2019   TSH 4.52 (H) 07/19/2019   FREET4 1.11 01/31/2020   FREET4 1.23 10/24/2019   FREET4 0.89 07/19/2019     Review of Systems    LABS:  Lab on 04/24/2020  Component Date Value Ref Range Status  . Microalb, Ur  04/24/2020 10.0* 0.0 - 1.9 mg/dL Final  . Creatinine,U 04/24/2020 36.5  mg/dL Final  . Microalb Creat Ratio 04/24/2020 27.3  0.0 - 30.0 mg/g Final  . Sodium 04/24/2020 136  135 - 145 mEq/L Final  . Potassium 04/24/2020 4.5  3.5 - 5.1 mEq/L Final  . Chloride 04/24/2020 102  96 - 112 mEq/L Final  . CO2 04/24/2020 27  19 - 32 mEq/L Final  . Glucose, Bld 04/24/2020 119* 70 - 99 mg/dL Final  . BUN 04/24/2020 18  6 - 23 mg/dL Final  . Creatinine, Ser 04/24/2020 0.76  0.40 - 1.20 mg/dL Final  . GFR 04/24/2020 80.56  >60.00 mL/min Final   Calculated using the CKD-EPI Creatinine Equation (2021)  . Calcium 04/24/2020 10.0  8.4 - 10.5 mg/dL Final  . Hgb A1c MFr Bld 04/24/2020 7.5* 4.6 - 6.5 % Final   Glycemic Control Guidelines for People with Diabetes:Non Diabetic:  <6%Goal of Therapy: <7%Additional Action Suggested:  >8%     Physical Examination:  BP 126/78   Pulse 91   Ht $R'5\' 4"'md$  (1.626 m)   Wt 161 lb 6.4 oz (73.2 kg)   SpO2 93%   BMI 27.70 kg/m     ASSESSMENT:  Diabetes type 2, mildly obese  See history of present illness for detailed discussion of his current management, blood sugar patterns and problems identified  A1c is 7.5  She is on basal bolus insulin with Invokana 300 mg  As before her A1c is higher than expected for her blood sugars are averaging about 127 recently including some postprandial readings She usually watching her diet and maintaining her weight with regular exercise also  Renal function stable with continuing Invokana and Zestril together  MICROALBUMINURIA: This is better controlled with using 20 mg lisinopril  HYPOTHYROIDISM: Previously had normal TSH levels   Hypertension: Blood pressure is well controlled, did have a high reading on initial measurement  PLAN:   She will continue same doses of insulin as in HPI Influenza vaccine given, high-dose She will let us know if she has consistently high blood sugars either fasting or after meals Reassured  her that blood sugars in the 90s are normal  Continue lisinopril 20 mg daily Will need annual microalbumin follow-up  History of mild hypercalcemia and mild asymptomatic osteopenia: We will schedule follow-up bone density on her next visit in 3/22    There are no Patient Instructions on file for this visit.  [name] 04/27/2020, 11:16 AM   Note: This office note was prepared with Dragon voice recognition system technology. Any  transcriptional errors that result from this process are unintentional.

## 2020-05-01 ENCOUNTER — Other Ambulatory Visit: Payer: Self-pay

## 2020-05-01 ENCOUNTER — Encounter: Payer: Self-pay | Admitting: Podiatry

## 2020-05-01 ENCOUNTER — Ambulatory Visit (INDEPENDENT_AMBULATORY_CARE_PROVIDER_SITE_OTHER): Payer: Medicare HMO | Admitting: Podiatry

## 2020-05-01 DIAGNOSIS — L84 Corns and callosities: Secondary | ICD-10-CM

## 2020-05-01 DIAGNOSIS — E1151 Type 2 diabetes mellitus with diabetic peripheral angiopathy without gangrene: Secondary | ICD-10-CM | POA: Diagnosis not present

## 2020-05-01 DIAGNOSIS — R52 Pain, unspecified: Secondary | ICD-10-CM | POA: Diagnosis not present

## 2020-05-01 NOTE — Progress Notes (Signed)
  Subjective:  Patient ID: Kimberly [name], female    DOB: 03-11-1951,  MRN: [id number]  Chief Complaint  Patient presents with  . Callouses    left foot painful hard place to the lateral side of the foot     69 y.o. female presents with the above complaint. History confirmed with patient.  She has type 2 diabetes.  She her last A1c was 7.5%.  She denies any numbness or tingling in the feet.  Objective:  Physical Exam: warm, good capillary refill, no trophic changes or ulcerative lesions, normal DP and PT pulses, normal monofilament exam and normal sensory exam. Left Foot: Large porokeratosis submet 5  Assessment:   1. Diabetes mellitus with peripheral vascular disease (HCC)   2. Callus of foot   3. Pain      Plan:  Patient was evaluated and treated and all questions answered.  Patient educated on diabetes. Discussed proper diabetic foot care and discussed risks and complications of disease. Educated patient in depth on reasons to return to the office immediately should he/she discover anything concerning or new on the feet. All questions answered. Discussed proper shoes as well.   All symptomatic hyperkeratoses were safely debrided with a sterile #15 blade to patient's level of comfort without incident. We discussed preventative and palliative care of these lesions including supportive and accommodative shoegear, padding, prefabricated and custom molded accommodative orthoses, use of a pumice stone and lotions/creams daily.  Recommend she use urea cream  Return if symptoms worsen or fail to improve.

## 2020-05-01 NOTE — Patient Instructions (Signed)
Look for urea 40% cream or ointment and apply to the thickened dry skin / calluses. This can be bought over the counter, at a pharmacy or online such as [employer].  

## 2020-05-23 ENCOUNTER — Other Ambulatory Visit: Payer: Self-pay | Admitting: Internal Medicine

## 2020-05-23 ENCOUNTER — Other Ambulatory Visit: Payer: Self-pay | Admitting: Endocrinology

## 2020-06-26 ENCOUNTER — Other Ambulatory Visit: Payer: Self-pay | Admitting: Endocrinology

## 2020-07-30 ENCOUNTER — Telehealth: Payer: Self-pay | Admitting: Pharmacist

## 2020-07-30 NOTE — Chronic Care Management (AMB) (Signed)
° ° °  Chronic Care Management Pharmacy Assistant   Name: Kimberly [name]  MRN: [id number] DOB: Aug 28, 1950  Reason for Encounter: General Adherence Call  PCP : Burnis Medin, MD  Allergies:   Allergies  Allergen Reactions   Metformin And Related Nausea And Vomiting    After one pill at hospital   Januvia [Sitagliptin Phosphate] Other (See Comments)    Swelling and edema see 4 13 OV    Medications: Outpatient Encounter Medications as of 07/30/2020  Medication Sig Note   Accu-Chek FastClix Lancets MISC Use as instructed to check blood sugar 4 times daily.    ACCU-CHEK GUIDE test strip USE ACCU CHECK GUIDE TEST STRIPS AS INSTRUCTED TO CHECK BLOOD SUGAR 4 TIMES DAILY.    albuterol (VENTOLIN HFA) 108 (90 Base) MCG/ACT inhaler Inhale 2 puffs into the lungs every 6 (six) hours as needed. For shortness of breath or wheezing 03/20/2020: As needed   aspirin 325 MG EC tablet Take 325 mg by mouth every morning.     atorvastatin (LIPITOR) 80 MG tablet TAKE 1 TABLET BY MOUTH EVERY DAY    Blood Glucose Monitoring Suppl (ACCU-CHEK GUIDE) w/Device KIT 1 each by Does not apply route 4 (four) times daily. Use Accu Chek to check blood sugar 4 times daily.    ezetimibe (ZETIA) 10 MG tablet TAKE 1 TABLET BY MOUTH EVERY DAY    Insulin Pen Needle (BD PEN NEEDLE NANO U/F) 32G X 4 MM MISC Use as instructed to inject 3 insulin 3 times daily.    INVOKANA 300 MG TABS tablet TAKE 1 TABLET BY MOUTH EVERY DAY BEFORE BREAKFAST    LANTUS SOLOSTAR 100 UNIT/ML Solostar Pen INJECT 43 UNITS ONCE DAILY    levothyroxine (SYNTHROID) 50 MCG tablet TAKE 1 TABLET BY MOUTH EVERY DAY    lisinopril (ZESTRIL) 20 MG tablet Take 1 tablet (20 mg total) by mouth daily.    NOVOLOG FLEXPEN 100 UNIT/ML FlexPen INJECT 18 UNITS UNDER THE SKIN AT LARGEST MEAL AND 10 UNITS AT DINNER    No facility-administered encounter medications on file as of 07/30/2020.    Current Diagnosis: Patient Active Problem List   Diagnosis Date  Noted   Death of family member 11-17-2013   Type II or unspecified type diabetes mellitus with peripheral circulatory disorders, uncontrolled(250.72) 07/25/2013   Diabetes mellitus with peripheral vascular disease ([place]) 09/25/2011   Encounter for preventive health examination 03/29/2011   COPD (chronic obstructive pulmonary disease) ([place])    History of ventricular fibrillation    GLAUCOMA 07/12/2010   HYPERCALCEMIA 19/14/7829   SYSTOLIC MURMUR 56/21/3086   DEFICIENCY, VITAMIN D NOS 03/31/2007   OSTEOPOROSIS 03/24/2007   PERIPHERAL VASCULAR DISEASE 01/03/2007   DIABETES MELLITUS, TYPE II 12/03/2006   Hyperlipidemia associated with type 2 diabetes mellitus ([place]) 12/03/2006   TOBACCO USER 12/03/2006   Essential hypertension 12/03/2006   Coronary atherosclerosis 12/03/2006    Goals Addressed   None     Follow-Up:  Pharmacist Review  I spoke with the patient and discussed medication adherence with the patient, no issues at this time with current medication. She states that her A1C is currently at 7.5. she was unable to give me any of her blood sugar numbers currently. There have been no recent changes in her medications. She denies any emergency department visits since her last CPP follow-up also denies any side effects from her medications or issues with her current pharmacy.  [name], [place] Assistant [phone]

## 2020-08-16 ENCOUNTER — Other Ambulatory Visit: Payer: Self-pay | Admitting: Endocrinology

## 2020-08-20 ENCOUNTER — Telehealth: Payer: Self-pay | Admitting: Internal Medicine

## 2020-08-20 DIAGNOSIS — R21 Rash and other nonspecific skin eruption: Secondary | ICD-10-CM

## 2020-08-20 NOTE — Telephone Encounter (Signed)
States the right cheek and ear is red and feels like sand paper. Also in other places such as the left hand.  This is ongoing for a month and is spreading. This is the first time this has happened. No pain but this itches.  Patient states she has not changed any soaps, detergents, eating habits, and has not traveled.

## 2020-08-20 NOTE — Telephone Encounter (Signed)
Pt call and want a referral to go to [facility] and want a call back.

## 2020-08-20 NOTE — Telephone Encounter (Signed)
Please advise 

## 2020-08-20 NOTE — Telephone Encounter (Signed)
Pain information about why she wants referral Needed diagnosis and then okay to the referral. Last visit with me was September 2021

## 2020-08-21 NOTE — Telephone Encounter (Addendum)
Ok t Please place dermatology referral for the above problem

## 2020-08-21 NOTE — Addendum Note (Signed)
Addended by: Thressa Sheller on: 08/21/2020 01:44 PM   Modules accepted: Orders

## 2020-08-21 NOTE — Telephone Encounter (Signed)
Referral placed.

## 2020-08-28 ENCOUNTER — Other Ambulatory Visit (INDEPENDENT_AMBULATORY_CARE_PROVIDER_SITE_OTHER): Payer: Medicare HMO

## 2020-08-28 ENCOUNTER — Other Ambulatory Visit: Payer: Self-pay | Admitting: Endocrinology

## 2020-08-28 ENCOUNTER — Other Ambulatory Visit: Payer: Self-pay

## 2020-08-28 DIAGNOSIS — E063 Autoimmune thyroiditis: Secondary | ICD-10-CM | POA: Diagnosis not present

## 2020-08-28 DIAGNOSIS — E1165 Type 2 diabetes mellitus with hyperglycemia: Secondary | ICD-10-CM | POA: Diagnosis not present

## 2020-08-28 DIAGNOSIS — Z794 Long term (current) use of insulin: Secondary | ICD-10-CM

## 2020-08-28 DIAGNOSIS — E559 Vitamin D deficiency, unspecified: Secondary | ICD-10-CM | POA: Diagnosis not present

## 2020-08-28 LAB — VITAMIN D 25 HYDROXY (VIT D DEFICIENCY, FRACTURES): VITD: 44.02 ng/mL (ref 30.00–100.00)

## 2020-08-28 LAB — BASIC METABOLIC PANEL
BUN: 20 mg/dL (ref 6–23)
CO2: 26 mEq/L (ref 19–32)
Calcium: 10.3 mg/dL (ref 8.4–10.5)
Chloride: 105 mEq/L (ref 96–112)
Creatinine, Ser: 0.78 mg/dL (ref 0.40–1.20)
GFR: 77.9 mL/min (ref >60.00)
Glucose, Bld: 143 mg/dL — ABNORMAL HIGH (ref 70–99)
Potassium: 4.9 mEq/L (ref 3.5–5.1)
Sodium: 140 mEq/L (ref 135–145)

## 2020-08-28 LAB — TSH: TSH: 3.62 u[IU]/mL (ref 0.35–4.50)

## 2020-08-28 LAB — HEMOGLOBIN A1C: Hgb A1c MFr Bld: 7.7 % — ABNORMAL HIGH (ref 4.6–6.5)

## 2020-08-31 ENCOUNTER — Ambulatory Visit: Payer: Medicare HMO | Admitting: Endocrinology

## 2020-08-31 ENCOUNTER — Other Ambulatory Visit: Payer: Self-pay

## 2020-08-31 ENCOUNTER — Encounter: Payer: Self-pay | Admitting: Endocrinology

## 2020-08-31 VITALS — BP 122/80 | HR 83 | Resp 18 | Ht 64.0 in | Wt 165.0 lb

## 2020-08-31 DIAGNOSIS — M85859 Other specified disorders of bone density and structure, unspecified thigh: Secondary | ICD-10-CM | POA: Diagnosis not present

## 2020-08-31 DIAGNOSIS — E063 Autoimmune thyroiditis: Secondary | ICD-10-CM | POA: Diagnosis not present

## 2020-08-31 DIAGNOSIS — Z794 Long term (current) use of insulin: Secondary | ICD-10-CM | POA: Diagnosis not present

## 2020-08-31 DIAGNOSIS — E1165 Type 2 diabetes mellitus with hyperglycemia: Secondary | ICD-10-CM | POA: Diagnosis not present

## 2020-08-31 DIAGNOSIS — I10 Essential (primary) hypertension: Secondary | ICD-10-CM

## 2020-08-31 NOTE — Patient Instructions (Addendum)
Call before refilling Lantus  Check blood sugars on waking up 3 days a week  Also check blood sugars about 2 hours after meals and do this after different meals by rotation  Recommended blood sugar levels on waking up are 90-130 and about 2 hours after meal is 130-160  Please bring your blood sugar monitor to each visit, thank you

## 2020-08-31 NOTE — Progress Notes (Signed)
Patient ID: Kimberly [name], female   DOB: January 10, 1951, 70 y.o.   MRN: [id number]           Reason for Appointment: Endocrinology follow-up    History of Present Illness:          Date of diagnosis of type 2 diabetes mellitus: 1995        Background history:   For several years she had been tried on oral agents with variable control.  Detailed history not available She thinks that she had nausea and vomiting with metformin and could not take much Also had been tried on Amaryl  She thinks she had swelling with Januvia and not clear what other medications she has tried before  She was started on insulin in 07/2013 when her A1c was over 11% Because of her poor control and A1c of 9.1 she was started on Invokana 100 mg daily in 12/16  Recent history:   INSULIN regimen is: 43 Lantus hs   Novolog 15  units before lunch. Novolog 10 acs    Non-insulin hypoglycemic drugs the patient is taking are:  Invokana 300 mg daily  Her A1c is about the same at 7.7  A1c previously has been as low as 6.7   Current blood sugar patterns, management and problems identified:  She did bring her monitor  Not clear why her A1c is trending higher even though her blood sugars are still averaging about the same  She is fairly consistent with checking her sugars both fasting and after her meals at least lunch and supper  She may have low normal or slightly low readings after dinner occasionally with lighter meals  However fasting readings are variable and relatively higher  Weight has gone up 3 to 4 pounds  She is not doing much formal exercise except walking around at [facility]  Taking Lantus consistently at night No change in renal function with continuing Invokana but she is concerned about the cost       Side effects from medications have been: Nausea from metformin,?  Swelling from Januvia  Glucose monitoring:  done 1-2 times a day         Glucometer:  Accu-Chek       Blood Glucose readings from  download:   PRE-MEAL Fasting Lunch Dinner Bedtime Overall  Glucose range: 94-164     66-192  Mean/median:  134    124   POST-MEAL PC Breakfast PC Lunch PC Dinner  Glucose range:   95-182  68-192  Mean/median:   123  111   Prior  PRE-MEAL Fasting Lunch Dinner Bedtime Overall  Glucose range:  91-133      Mean/median:  120     127   POST-MEAL PC Breakfast PC Lunch PC Dinner  Glucose range:   94-153  99-189  Mean/median:         Self-care: The diet that the patient has been following is: tries to limit fats, breads.     Meal times are:  Lunch: 12 pm Dinner: 5-7 PM   Typical meal intake: Breakfast is minimal          Dietician visit, most recent: 09/2015                Weight history:  Wt Readings from Last 3 Encounters:  08/31/20 165 lb (74.8 kg)  04/27/20 161 lb 6.4 oz (73.2 kg)  02/15/20 162 lb 9.6 oz (73.8 kg)    Glycemic control:   Lab Results  Component  Value Date   HGBA1C 7.7 (H) 08/28/2020   HGBA1C 7.5 (H) 04/24/2020   HGBA1C 7.6 (H) 01/31/2020   Lab Results  Component Value Date   MICROALBUR 10.0 (H) 04/24/2020   LDLCALC 51 01/31/2020   CREATININE 0.78 08/28/2020    Other active problems discussed today: See review of systems   Allergies as of 08/31/2020      Reactions   Metformin And Related Nausea And Vomiting   After one pill at hospital   Januvia [sitagliptin Phosphate] Other (See Comments)   Swelling and edema see 4 13 OV      Medication List       Accurate as of August 31, 2020  9:16 AM. If you have any questions, ask your nurse or doctor.        Accu-Chek FastClix Lancets Misc Use as instructed to check blood sugar 4 times daily.   Accu-Chek Guide test strip Generic drug: glucose blood USE ACCU CHECK GUIDE TEST STRIPS AS INSTRUCTED TO CHECK BLOOD SUGAR 4 TIMES DAILY.   Accu-Chek Guide w/Device Kit 1 each by Does not apply route 4 (four) times daily. Use Accu Chek to check blood sugar 4 times daily.   albuterol 108 (90 Base)  MCG/ACT inhaler Commonly known as: VENTOLIN HFA Inhale 2 puffs into the lungs every 6 (six) hours as needed. For shortness of breath or wheezing   aspirin 325 MG EC tablet Take 325 mg by mouth every morning.   atorvastatin 80 MG tablet Commonly known as: LIPITOR TAKE 1 TABLET BY MOUTH EVERY DAY   BD Pen Needle Nano U/F 32G X 4 MM Misc Generic drug: Insulin Pen Needle Use as instructed to inject 3 insulin 3 times daily.   ezetimibe 10 MG tablet Commonly known as: ZETIA TAKE 1 TABLET BY MOUTH EVERY DAY   Invokana 300 MG Tabs tablet Generic drug: canagliflozin TAKE 1 TABLET BY MOUTH EVERY DAY BEFORE BREAKFAST   Lantus SoloStar 100 UNIT/ML Solostar Pen Generic drug: insulin glargine INJECT 43 UNITS ONCE DAILY   levothyroxine 50 MCG tablet Commonly known as: SYNTHROID TAKE 1 TABLET BY MOUTH EVERY DAY   lisinopril 20 MG tablet Commonly known as: ZESTRIL Take 1 tablet (20 mg total) by mouth daily.   NovoLOG FlexPen 100 UNIT/ML FlexPen Generic drug: insulin aspart INJECT 18 UNITS UNDER THE SKIN AT LARGEST MEAL AND 10 UNITS AT DINNER       Allergies:  Allergies  Allergen Reactions  . Metformin And Related Nausea And Vomiting    After one pill at hospital  . Januvia [Sitagliptin Phosphate] Other (See Comments)    Swelling and edema see 4 13 OV    Past Medical History:  Diagnosis Date  . CAD (coronary artery disease)   . Colitis   . Colitis, indeterminate 01/31/2012  . COPD (chronic obstructive pulmonary disease) ([place])   . Depression 05/16/2017  . Depression with anxiety 05/30/2016  . Diverticulitis 02/10/2012  . DM (diabetes mellitus) ([place])   . Glaucoma   . Glaucoma   . History of ventricular fibrillation   . HTN (hypertension)   . Myocardial infarction ([place])    12/1998, 01/2011  . Osteoporosis   . PVD (peripheral vascular disease) ([place])     Past Surgical History:  Procedure Laterality Date  . ABDOMINAL HYSTERECTOMY     endometriosis  . CABG x 3  01/22/2011    Dr Cyndia Bent  . CARDIAC CATHETERIZATION  01/17/2011   Dr Arvella Merles  . LIPOMA EXCISION Left 02/08/2015  Procedure: EXCISION OF LEFT THIGH LIPOMA;  Surgeon: Greer Pickerel, MD;  Location: [facility];  Service: General;  Laterality: Left;  . S/P aortobifemoral bypass  2008   Dr Donnetta Hutching  . S/P myocardial infarction and PCI of the lt circumflex  2000    Family History  Problem Relation Age of Onset  . Heart attack Father   . Diabetes Father   . Heart disease Father   . Diabetes Brother   . Diabetes Mother   . Liver cancer Mother   . Hypertension Other   . Thyroid disease Other        sisters- sister had parathyroidectomy  . Alcohol abuse Other        brother  . Colon polyps Maternal Uncle   . Breast cancer Sister   . Diabetes Sister   . Thyroid disease Sister     Social History:  reports that she has been smoking cigarettes. She has been smoking about 1.00 pack per day. She has never used smokeless tobacco. She reports that she does not drink alcohol and does not use drugs.    Review of Systems    Lipid history: On 80 mg Lipitor with history of CAD Prescribed by PCP  Her LDL is last improved with mildly increased triglycerides  Lab Results  Component Value Date   CHOL 110 01/31/2020   CHOL 112 02/03/2019   CHOL 102 11/24/2018   Lab Results  Component Value Date   HDL 23.80 (L) 01/31/2020   HDL 26.20 (L) 02/03/2019   HDL 22.20 (L) 11/24/2018   Lab Results  Component Value Date   LDLCALC 51 01/31/2020   LDLCALC 50 02/03/2019   LDLCALC 87 05/20/2018   Lab Results  Component Value Date   TRIG 176.0 (H) 01/31/2020   TRIG 178.0 (H) 02/03/2019   TRIG 303.0 (H) 11/24/2018   Lab Results  Component Value Date   CHOLHDL 5 01/31/2020   CHOLHDL 4 02/03/2019   CHOLHDL 5 11/24/2018   Lab Results  Component Value Date   LDLDIRECT 54.0 11/24/2018   LDLDIRECT 87.0 04/24/2015   LDLDIRECT 150.3 07/19/2013            Hypertension: Present for several  years  Blood pressure at home: Not being checked  taking LISINOPRIL 20 mg daily This was changed in the last visit because of   microalbuminuria which is back to normal  BP Readings from Last 3 Encounters:  08/31/20 122/80  04/27/20 126/78  02/15/20 128/68    HYPERCALCEMIA: She has had periodic mild hypercalcemia since about 2010; PTH levels in the mid-upper range Calcium is upper normal and stable  Lab Results  Component Value Date   CALCIUM 10.3 08/28/2020    She is taking 1000 units of vitamin D with adequate vitamin D levels  OSTEOPENIA:  No history of fractures  Her last bone density was in 2/19 and this showed T score of -1.2 indicating mild osteopenia at the hip, her spine also shows a relative decline  She was tried on Evista but she says it was causing her hot flashes and night sweats and she stopped taking this  Previously in 07/2015 T score -2.3 was at the wrist, this was not measured in 2019  Most recent eye exam was normal in 5/21 and she does go every year  Most recent foot exam: 07/2018 by PCP  HYPOTHYROIDISM:  She has a family history of hypothyroidism, Graves' disease and thyroid nodules  On her visit in  11/20 she was complaining of feeling more tired as well as colder; had some weight gain also.   Baseline TSH was 6  With levothyroxine 25 mcg daily she did not feel any better and with TSH still 4.5 she was told to go up to 50 mcg in 07/2019 With a higher dose she has less fatigue and still feels fairly good now  She takes her oxygen consistently every morning  Her TSH is now 3.6   Lab Results  Component Value Date   TSH 3.62 08/28/2020   TSH 3.28 01/31/2020   TSH 2.35 10/24/2019   FREET4 1.11 01/31/2020   FREET4 1.23 10/24/2019   FREET4 0.89 07/19/2019     Review of Systems    LABS:  Lab on 08/28/2020  Component Date Value Ref Range Status  . TSH 08/28/2020 3.62  0.35 - 4.50 uIU/mL Final  . VITD 08/28/2020 44.02  30.00 - 100.00  ng/mL Final  . Sodium 08/28/2020 140  135 - 145 mEq/L Final  . Potassium 08/28/2020 4.9  3.5 - 5.1 mEq/L Final  . Chloride 08/28/2020 105  96 - 112 mEq/L Final  . CO2 08/28/2020 26  19 - 32 mEq/L Final  . Glucose, Bld 08/28/2020 143* 70 - 99 mg/dL Final  . BUN 08/28/2020 20  6 - 23 mg/dL Final  . Creatinine, Ser 08/28/2020 0.78  0.40 - 1.20 mg/dL Final  . GFR 08/28/2020 77.90  >60.00 mL/min Final   Calculated using the CKD-EPI Creatinine Equation (2021)  . Calcium 08/28/2020 10.3  8.4 - 10.5 mg/dL Final  . Hgb A1c MFr Bld 08/28/2020 7.7* 4.6 - 6.5 % Final   Glycemic Control Guidelines for People with Diabetes:Non Diabetic:  <6%Goal of Therapy: <7%Additional Action Suggested:  >8%     Physical Examination:  BP 122/80 (BP Location: Left Arm, Patient Position: Sitting, Cuff Size: Normal)   Pulse 83   Resp 18   Ht $R'5\' 4"'tL$  (1.626 m)   Wt 165 lb (74.8 kg)   SpO2 98%   BMI 28.32 kg/m     ASSESSMENT:  Diabetes type 2, mildly obese  See history of present illness for detailed discussion of his current management, blood sugar patterns and problems identified  A1c is 7.7  She is on basal bolus insulin with Invokana 300 mg  She has overall fairly good blood sugars although relatively high in the morning and apparently after certain meals She is however probably since her limitation in her fasting independent using Lantus insulin Blood sugar patterns were discussed  No side effects with Invokana although she is concerned about the cost, [place] or [place] is not better covered  MICROALBUMINURIA: This is better controlled with using 20 mg lisinopril and will follow up on next visit also  HYPOTHYROIDISM: Again has normal TSH levels and will continue 50 mcg  Hypertension: Blood pressure is well controlled  PLAN:    She will do better with [place] or [place] which appear to be equally well covered However since she has 2 boxes of Lantus she can use this up and subsequently changed  to [place] as she thinks she can continue affording this now  She will let us know if she has consistently high blood sugars either fasting or after meals Reassured her that blood sugars in the 90s are normal  No change in lisinopril  Continue vitamin D supplements as her level is normal  History of mild hypercalcemia and mild asymptomatic osteopenia: Bone density will be ordered  Bone density to be scheduled by patient    There are no Patient Instructions on file for this visit.  [name] 08/31/2020, 9:16 AM   Note: This office note was prepared with Dragon voice recognition system technology. Any transcriptional errors that result from this process are unintentional.

## 2020-09-18 ENCOUNTER — Telehealth: Payer: Self-pay | Admitting: Pharmacist

## 2020-09-18 NOTE — Chronic Care Management (AMB) (Signed)
I spoke with the patient about her upcoming appointment on 09/19/2020 @ 12 ;00 pm with the clinical pharmacist. She was asked to please have all medication on hand to review with the pharmacist. She confirmed the appointment.   [name], [place] [phone]

## 2020-09-19 ENCOUNTER — Ambulatory Visit (INDEPENDENT_AMBULATORY_CARE_PROVIDER_SITE_OTHER): Payer: Medicare HMO | Admitting: Pharmacist

## 2020-09-19 DIAGNOSIS — I10 Essential (primary) hypertension: Secondary | ICD-10-CM | POA: Diagnosis not present

## 2020-09-19 DIAGNOSIS — E1169 Type 2 diabetes mellitus with other specified complication: Secondary | ICD-10-CM | POA: Diagnosis not present

## 2020-09-19 DIAGNOSIS — E1151 Type 2 diabetes mellitus with diabetic peripheral angiopathy without gangrene: Secondary | ICD-10-CM | POA: Diagnosis not present

## 2020-09-19 DIAGNOSIS — E785 Hyperlipidemia, unspecified: Secondary | ICD-10-CM | POA: Diagnosis not present

## 2020-09-19 NOTE — Progress Notes (Signed)
Chronic Care Management Pharmacy Note  10/02/2020 Name:  Kimberly [name] MRN:  [id number] DOB:  Aug 09, 1950  Subjective: Kimberly [name] is an 70 y.o. year old female who is a primary patient of Panosh, Standley Brooking, MD.  The CCM team was consulted for assistance with disease management and care coordination needs.    Engaged with patient by telephone for follow up visit in response to provider referral for pharmacy case management and/or care coordination services.   Consent to Services:  The patient was given information about Chronic Care Management services, agreed to services, and gave verbal consent prior to initiation of services.  Please see initial visit note for detailed documentation.   Patient Care Team: Panosh, Standley Brooking, MD as PCP - General Cyndia Bent Fernande Boyden, MD (Cardiothoracic Surgery) Early, Arvilla Meres, MD (Thoracic Diseases) [name], MD as Consulting Physician (Endocrinology) Viona Gilmore, [facility] as Pharmacist (Pharmacist)  Recent office visits: 02/15/20 Shanon Ace, MD: Patient presented for annual exam. No changes made.  Recent consult visits: 08/31/20 [name], MD (endo): Patient presented for DM follow up. No changes made. Plan to switch Lantus to U-200 [place] once she runs out of Lantus. Cost concerns with Invokana.  05/01/20 Lanae Crumbly, DPM (podiatry): Patient presented for debridement of calluses.  04/27/20 [name], MD (endo): Patient presented for DM follow up. Influenza vaccine administered.  Hospital visits: None in previous 6 months  Objective:  Lab Results  Component Value Date   CREATININE 0.78 08/28/2020   BUN 20 08/28/2020   GFR 77.90 08/28/2020   GFRNONAA 76 (L) 02/01/2012   GFRAA 88 (L) 02/01/2012   NA 140 08/28/2020   K 4.9 08/28/2020   CALCIUM 10.3 08/28/2020   CO2 26 08/28/2020   GLUCOSE 143 (H) 08/28/2020    Lab Results  Component Value Date/Time   HGBA1C 7.7 (H) 08/28/2020 08:09 AM   HGBA1C 7.5 (H) 04/24/2020 08:59 AM    FRUCTOSAMINE 262 02/03/2019 07:46 AM   GFR 77.90 08/28/2020 08:09 AM   GFR 80.56 04/24/2020 08:59 AM   MICROALBUR 10.0 (H) 04/24/2020 08:59 AM   MICROALBUR 35.5 (H) 01/31/2020 07:49 AM    Last diabetic Eye exam:  Lab Results  Component Value Date/Time   HMDIABEYEEXA No Retinopathy 11/04/2019 12:00 AM    Last diabetic Foot exam:  Lab Results  Component Value Date/Time   HMDIABFOOTEX done 07/12/2010 12:00 AM     Lab Results  Component Value Date   CHOL 110 01/31/2020   HDL 23.80 (L) 01/31/2020   LDLCALC 51 01/31/2020   LDLDIRECT 54.0 11/24/2018   TRIG 176.0 (H) 01/31/2020   CHOLHDL 5 01/31/2020    Hepatic Function Latest Ref Rng & Units 01/31/2020 04/12/2019 11/24/2018  Total Protein 6.0 - 8.3 g/dL 7.2 7.4 6.5  Albumin 3.5 - 5.2 g/dL 4.3 4.4 4.1  AST 0 - 37 U/L $Remo'17 20 15  'aaSKr$ ALT 0 - 35 U/L $Remo'17 23 17  'bNIAT$ Alk Phosphatase 39 - 117 U/L 80 94 86  Total Bilirubin 0.2 - 1.2 mg/dL 0.6 0.6 0.5  Bilirubin, Direct 0.0 - 0.3 mg/dL - - -    Lab Results  Component Value Date/Time   TSH 3.62 08/28/2020 08:09 AM   TSH 3.28 01/31/2020 07:49 AM   FREET4 1.11 01/31/2020 07:49 AM   FREET4 1.23 10/24/2019 08:07 AM    CBC Latest Ref Rng & Units 08/09/2018 09/24/2017 08/06/2017  WBC 4.0 - 10.5 K/uL 6.7 6.0 7.9  Hemoglobin 12.0 - 15.0 g/dL 16.7(H) 16.0(H) 16.7(H)  Hematocrit 36.0 - 46.0 % 48.8(H) 47.0(H) 48.8(H)  Platelets 150.0 - 400.0 K/uL 176.0 160.0 191.0    Lab Results  Component Value Date/Time   VD25OH 44.02 08/28/2020 08:09 AM   VD25OH 70.06 11/24/2018 01:36 PM    Clinical ASCVD: Yes  The ASCVD Risk score Mikey Bussing DC Jr., et al., 2013) failed to calculate for the following reasons:   The valid total cholesterol range is 130 to 320 mg/dL    Depression screen [facility] 2/9 03/20/2020 02/15/2020 10/11/2018  Decreased Interest 0 0 1  Down, Depressed, Hopeless 0 0 0  PHQ - 2 Score 0 0 1  Altered sleeping - 0 -  Tired, decreased energy - 0 -  Change in appetite - 0 -  Feeling bad or failure about  yourself  - 0 -  Trouble concentrating - 0 -  Moving slowly or fidgety/restless - 0 -  Suicidal thoughts - 0 -  PHQ-9 Score - 0 -  Difficult doing work/chores - Not difficult at all -  Some recent data might be hidden      Social History   Tobacco Use  Smoking Status Current Every Day Smoker  . Packs/day: 1.00  . Types: Cigarettes  Smokeless Tobacco Never Used   BP Readings from Last 3 Encounters:  08/31/20 122/80  04/27/20 126/78  02/15/20 128/68   Pulse Readings from Last 3 Encounters:  08/31/20 83  04/27/20 91  02/15/20 88   Wt Readings from Last 3 Encounters:  08/31/20 165 lb (74.8 kg)  04/27/20 161 lb 6.4 oz (73.2 kg)  02/15/20 162 lb 9.6 oz (73.8 kg)   BMI Readings from Last 3 Encounters:  08/31/20 28.32 kg/m  04/27/20 27.70 kg/m  02/15/20 28.35 kg/m    Assessment/Interventions: Review of patient past medical history, allergies, medications, health status, including review of consultants reports, laboratory and other test data, was performed as part of comprehensive evaluation and provision of chronic care management services.   SDOH:  (Social Determinants of Health) assessments and interventions performed: No  SDOH Screenings   Alcohol Screen: Not on file  Depression (PHQ2-9): Low Risk   . PHQ-2 Score: 0  Financial Resource Strain: Low Risk   . Difficulty of Paying Living Expenses: Not hard at all  Food Insecurity: No Food Insecurity  . Worried About Charity fundraiser in the Last Year: Never true  . Ran Out of Food in the Last Year: Never true  Housing: Low Risk   . Last Housing Risk Score: 0  Physical Activity: Insufficiently Active  . Days of Exercise per Week: 5 days  . Minutes of Exercise per Session: 10 min  Social Connections: Socially Isolated  . Frequency of Communication with Friends and Family: More than three times a week  . Frequency of Social Gatherings with Friends and Family: Three times a week  . Attends Religious Services:  Never  . Active Member of Clubs or Organizations: No  . Attends Archivist Meetings: Never  . Marital Status: Widowed  Stress: No Stress Concern Present  . Feeling of Stress : Not at all  Tobacco Use: High Risk  . Smoking Tobacco Use: Current Every Day Smoker  . Smokeless Tobacco Use: Never Used  Transportation Needs: No Transportation Needs  . Lack of Transportation (Medical): No  . Lack of Transportation (Non-Medical): No    CCM Care Plan  Allergies  Allergen Reactions  . Metformin And Related Nausea And Vomiting    After one pill at hospital  .  Januvia [Sitagliptin Phosphate] Other (See Comments)    Swelling and edema see 4 13 OV    Medications Reviewed Today    Reviewed by Faythe Casa, RN (Registered Nurse) on 08/31/20 at [phone]  Med List Status: <None>  Medication Order Taking? Sig Documenting Provider Last Dose Status Informant  Accu-Chek FastClix Lancets MISC [id number]  Use as instructed to check blood sugar 4 times daily. Reather Littler, MD  Active   ACCU-CHEK GUIDE test strip [id number]  USE ACCU CHECK GUIDE TEST STRIPS AS INSTRUCTED TO CHECK BLOOD SUGAR 4 TIMES DAILY. Reather Littler, MD  Active   albuterol (VENTOLIN HFA) 108 (90 Base) MCG/ACT inhaler [id number] Yes Inhale 2 puffs into the lungs every 6 (six) hours as needed. For shortness of breath or wheezing Panosh, Neta Mends, MD Taking Active            Med Note Marzella Schlein   Tue Mar 20, 2020  3:04 PM) As needed  aspirin 325 MG EC tablet [id number] Yes Take 325 mg by mouth every morning. [provider] Taking Active Self  atorvastatin (LIPITOR) 80 MG tablet [id number] Yes TAKE 1 TABLET BY MOUTH EVERY DAY Panosh, Neta Mends, MD Taking Active   Blood Glucose Monitoring Suppl (ACCU-CHEK GUIDE) w/Device KIT [id number]  1 each by Does not apply route 4 (four) times daily. Use Accu Chek to check blood sugar 4 times daily. Reather Littler, MD  Active   ezetimibe (ZETIA) 10 MG tablet [id number] Yes TAKE 1 TABLET BY  MOUTH EVERY DAY Panosh, Neta Mends, MD Taking Active   Insulin Pen Needle (BD PEN NEEDLE NANO U/F) 32G X 4 MM MISC [id number]  Use as instructed to inject 3 insulin 3 times daily. Reather Littler, MD  Active   INVOKANA 300 MG TABS tablet [id number] Yes TAKE 1 TABLET BY MOUTH EVERY DAY BEFORE Woodroe Mode, MD Taking Active   LANTUS SOLOSTAR 100 UNIT/ML Solostar Pen [id number] Yes INJECT 43 UNITS ONCE DAILY Reather Littler, MD Taking Active   levothyroxine (SYNTHROID) 50 MCG tablet [id number] Yes TAKE 1 TABLET BY MOUTH EVERY DAY Reather Littler, MD Taking Active   lisinopril (ZESTRIL) 20 MG tablet [id number] Yes Take 1 tablet (20 mg total) by mouth daily. Reather Littler, MD Taking Active   NOVOLOG FLEXPEN 100 UNIT/ML FlexPen [id number] Yes INJECT 18 UNITS UNDER THE SKIN AT LARGEST MEAL AND 10 UNITS AT Delynn Flavin, MD Taking Active           Patient Active Problem List   Diagnosis Date Noted  . Death of family member 11-Nov-2013  . Type II or unspecified type diabetes mellitus with peripheral circulatory disorders, uncontrolled(250.72) 07/25/2013  . Diabetes mellitus with peripheral vascular disease (HCC) 09/25/2011  . Encounter for preventive health examination 03/29/2011  . COPD (chronic obstructive pulmonary disease) (HCC)   . History of ventricular fibrillation   . GLAUCOMA 07/12/2010  . HYPERCALCEMIA 01/30/2009  . SYSTOLIC MURMUR 07/28/2008  . DEFICIENCY, VITAMIN D NOS 03/31/2007  . OSTEOPOROSIS 03/24/2007  . PERIPHERAL VASCULAR DISEASE 01/03/2007  . DIABETES MELLITUS, TYPE II 12/03/2006  . Hyperlipidemia associated with type 2 diabetes mellitus (HCC) 12/03/2006  . TOBACCO USER 12/03/2006  . Essential hypertension 12/03/2006  . Coronary atherosclerosis 12/03/2006    Immunization History  Administered Date(s) Administered  . Fluad Quad(high Dose 65+) 04/18/2019  . Influenza Split 03/31/2011, 03/31/2012  . Influenza Whole 03/22/2008, 04/25/2009, 03/12/2010  . Influenza, High Dose  Seasonal PF 03/16/2018, 04/27/2020  . Influenza,inj,Quad PF,6+ Mos 04/05/2013, 02/21/2014,  05/01/2015, 03/25/2016  . PFIZER(Purple Top)SARS-COV-2 Vaccination 01/19/2020  . Pneumococcal Conjugate-13 02/03/2017  . Pneumococcal Polysaccharide-23 06/16/1998, 07/24/2009, 08/09/2018  . Td 01/30/2009  . Zoster 01/18/2014    Conditions to be addressed/monitored:  Hypertension, Hyperlipidemia, Diabetes, Coronary Artery Disease, COPD, Hypothyroidism, Osteopenia, Tobacco use and vitamin D deficiency  Care Plan : CCM Pharmacy Care Plan  Updates made by Viona Gilmore, [place] since 10/02/2020 12:00 AM    Problem: Problem: Hypertension, Hyperlipidemia, Diabetes, Coronary Artery Disease, COPD, Hypothyroidism, Osteopenia, Tobacco use and vitamin D deficiency     Long-Range Goal: Patient-Specific Goal   Start Date: 09/19/2020  Expected End Date: 09/19/2021  This Visit's Progress: On track  Priority: High  Note:   Current Barriers:  . Unable to independently monitor therapeutic efficacy . Unable to achieve control of diabetes    Pharmacist Clinical Goal(s):  Marland Kitchen Patient will achieve adherence to monitoring guidelines and medication adherence to achieve therapeutic efficacy . achieve control of diabetes as evidenced by A1c  through collaboration with PharmD and provider.   Interventions: . 1:1 collaboration with Panosh, Standley Brooking, MD regarding development and update of comprehensive plan of care as evidenced by provider attestation and co-signature . Inter-disciplinary care team collaboration (see longitudinal plan of care) . Comprehensive medication review performed; medication list updated in electronic medical record  Hypertension (BP goal <130/80) -Controlled -Current treatment: . lisinopril 20 mg 1 tablet daily -Medications previously tried: none  -Current home readings: does not check at home -Current dietary habits: limiting salt intake -Current exercise habits: no structured exercise -Denies  hypotensive/hypertensive symptoms -Educated on Importance of home blood pressure monitoring; -Counseled to monitor BP at home weekly, document, and provide log at future appointments -Counseled on diet and exercise extensively Recommended to continue current medication Patient agreed to purchase an arm BP cuff and start checking at home  Hyperlipidemia: (LDL goal < 70) -Controlled -Current treatment:  Zetia $Remo'10mg'ZnWmn$ , 1 tablet daily  Atorvastatin $RemoveBefor'80mg'yELjfYMAuHag$ , 1 tablet once daily -Medications previously tried: rosuvastatin, fenofibrate, Vascepa -Current dietary patterns: did not discuss -Current exercise habits: no structured exercise -Educated on Cholesterol goals;  Benefits of statin for ASCVD risk reduction; Importance of limiting foods high in cholesterol; Exercise goal of 150 minutes per week; -Counseled on diet and exercise extensively Recommended to continue current medication Counseled on foods that can affect triglycerides also impact blood sugars  CAD (Goal: prevent heart attacks and strokes) -Controlled -Current treatment   Aspirin $Remove'325mg'FdtupcD$ , 1 tablet daily  Atorvastatin $RemoveBefor'80mg'hnoqHPBZezAO$ , 1 tablet once daily -Medications previously tried: none  -Recommended to continue current medication  Diabetes (A1c goal <7%) -Uncontrolled -Current medications:  Invokana $RemoveB'300mg'PiQZaAlw$ , 1 tablet once daily  Lantus, 43 units once daily at bedtime  Novolog Flexpen, 18 units with largest meal and 10 units at dinner -Medications previously tried: Januvia (swelling), metformin (N/V)  -Current home glucose readings . fasting glucose: checks morning M,W,F and night Tues, Thurs and St . post prandial glucose: n/a -Denies hypoglycemic/hyperglycemic symptoms -Current meal patterns:  . breakfast: n/a  . lunch: beans (pinto beans, northerns, black eyed peas), fish or skinless chicken (boiled or baked), salad once a month . dinner: same as lunch . snacks: cucumbers . drinks: small can of V8 -Current exercise:  walking some in and around the house for 15 minutes -Educated on A1c and blood sugar goals; Exercise goal of 150 minutes per week; Benefits of routine self-monitoring of blood sugar; Carbohydrate counting and/or plate method -Counseled to check feet daily and get yearly eye exams -Counseled on  diet and exercise extensively Recommended to continue current medication Counseled on benefits of CGM therapy Assessed patient finances. Will plan to apply for patient assistance for Jardiance or Farxiga.  COPD (Goal: control symptoms and prevent exacerbations) -Controlled -Current treatment  . albuterol HFA, 2 puffs every 6 hours as needed -Medications previously tried: none  -Gold Grade: Gold 2 (FEV1 50-79%) -Current COPD Classification:  A (low sx, <2 exacerbations/yr) -MMRC/CAT score: n/a -Pulmonary function testing: n/a -Exacerbations requiring treatment in last 6 months: none -Patient denies consistent use of maintenance inhaler -Frequency of rescue inhaler use: only in summertime when outside a lot -Counseled on Differences between maintenance and rescue inhalers -Recommended to continue current medication  Tobacco use (Goal quit smoking) -Uncontrolled -Previous quit attempts: none -Current treatment  . No medications -Patient smokes Within 30 minutes of waking -Patient triggers include: stress and habit -On a scale of 1-10, reports MOTIVATION to quit is 1 -On a scale of 1-10, reports CONFIDENCE in quitting is 1 -Provided contact information for [place] Quit Line (1-800-QUIT-NOW) and encouraged patient to reach out to this group for support. -Counseled on benefits of quitting smoking and recommended for her to find ways to distract herself from picking up a cigarette    Osteopenia (Goal prevent fractures) -Controlled -Last DEXA Scan: 07/27/2017              T-Score femoral neck: Right: -0.9; Left: -1.2             T-Score lumbar spine: +0.4              10-year probability of major  osteoporotic fracture: 8.4%             10-year probability of hip fracture: -3.7% -Patient is not a candidate for pharmacologic treatment -Current treatment  . Vitamin D 1000 units daily -Medications previously tried: Evista (hot flashes)  -Recommend [phone] units of vitamin D daily. Recommend 1200 mg of calcium daily from dietary and supplemental sources. Recommend weight-bearing and muscle strengthening exercises for building and maintaining bone density. -Recommended to continue current medication  Hypothyroidism (Goal: TSH 0.35-4.5) -Controlled -Current treatment  . levothyroxine 40mcg once daily -Medications previously tried: none  -Recommended to continue current medication   Health Maintenance -Vaccine gaps: shingrix, tetanus, second dose of COVID shot (patient refuses due to fear of side effects) -Current therapy:  . None -Educated on Cost vs benefit of each product must be carefully weighed by individual consumer -Patient is satisfied with current therapy and denies issues -Recommended to continue as is  Patient Goals/Self-Care Activities . Patient will:  - take medications as prescribed check glucose daily, document, and provide at future appointments check blood pressure weekly, document, and provide at future appointments target a minimum of 150 minutes of moderate intensity exercise weekly engage in dietary modifications by adding more vegetables to her diet  Follow Up Plan: Telephone follow up appointment with care management team member scheduled for: 4 months       Medication Assistance: Will consider switching to [place] or Jardiance for patient assistance.  Patient's preferred pharmacy is:  [id number] - [place], [place] - [address] [place] [address] Phone: [phone] Fax: [phone]  [facility] [place], [place] [address] Phone: [phone] Fax: [phone]  Uses pill box? Yes - weekly Pt endorses 100% compliance  We discussed: Current pharmacy is preferred with insurance plan and patient is satisfied with pharmacy services Patient decided to: Continue current medication management strategy  Care Plan and Follow Up Patient Decision:  Patient agrees to Care Plan and Follow-up.  Plan: Telephone follow up appointment with care management team member scheduled for:  4 months  Jeni Salles, PharmD [place] Pharmacist [place] [facility] [place] [phone]

## 2020-09-26 ENCOUNTER — Other Ambulatory Visit: Payer: Self-pay

## 2020-09-26 ENCOUNTER — Ambulatory Visit (INDEPENDENT_AMBULATORY_CARE_PROVIDER_SITE_OTHER)
Admission: RE | Admit: 2020-09-26 | Discharge: 2020-09-26 | Disposition: A | Discharge status: Home / Self Care | Payer: Medicare HMO | Source: Ambulatory Visit | Attending: Endocrinology | Admitting: Endocrinology

## 2020-09-26 DIAGNOSIS — M85859 Other specified disorders of bone density and structure, unspecified thigh: Secondary | ICD-10-CM

## 2020-09-26 DIAGNOSIS — E1165 Type 2 diabetes mellitus with hyperglycemia: Secondary | ICD-10-CM

## 2020-10-01 NOTE — Progress Notes (Signed)
Please let her know that bone density is down only 2% in the last 3 years and still not low enough to start medication

## 2020-10-15 ENCOUNTER — Other Ambulatory Visit: Payer: Self-pay | Admitting: Internal Medicine

## 2020-10-15 ENCOUNTER — Other Ambulatory Visit: Payer: Self-pay | Admitting: Endocrinology

## 2020-11-01 DIAGNOSIS — L400 Psoriasis vulgaris: Secondary | ICD-10-CM | POA: Diagnosis not present

## 2020-11-05 ENCOUNTER — Telehealth: Payer: Self-pay | Admitting: Endocrinology

## 2020-11-05 ENCOUNTER — Encounter: Payer: Self-pay | Admitting: Internal Medicine

## 2020-11-05 DIAGNOSIS — H2512 Age-related nuclear cataract, left eye: Secondary | ICD-10-CM | POA: Diagnosis not present

## 2020-11-05 DIAGNOSIS — E119 Type 2 diabetes mellitus without complications: Secondary | ICD-10-CM | POA: Diagnosis not present

## 2020-11-05 DIAGNOSIS — H43813 Vitreous degeneration, bilateral: Secondary | ICD-10-CM | POA: Diagnosis not present

## 2020-11-05 DIAGNOSIS — Z961 Presence of intraocular lens: Secondary | ICD-10-CM | POA: Diagnosis not present

## 2020-11-05 LAB — HM DIABETES EYE EXAM

## 2020-11-05 NOTE — Telephone Encounter (Signed)
Patient called re: Patient states Dr. Dwyane Dee wants to know when Patient is low on Lantus because Dr. Dwyane Dee wants to put Patient on a different medication. Patient states she is low on Lantus.

## 2020-11-06 ENCOUNTER — Encounter: Payer: Self-pay | Admitting: Endocrinology

## 2020-11-06 MED ORDER — TRESIBA FLEXTOUCH 200 UNIT/ML ~~LOC~~ SOPN: 42.0000 [IU] | PEN_INJECTOR | Freq: Every day | SUBCUTANEOUS | 2 refills | Status: DC

## 2020-11-06 NOTE — Addendum Note (Signed)
Addended by: Jacqualin Combes on: 11/06/2020 03:58 PM   Modules accepted: Orders

## 2020-11-06 NOTE — Telephone Encounter (Signed)
Sent as instructed. Patient have been notified.

## 2020-11-06 NOTE — Telephone Encounter (Signed)
Please send new prescription for Tresiba U-200, 42 units daily, may send 90-day prescription

## 2020-11-06 NOTE — Telephone Encounter (Signed)
Please advise 

## 2020-11-15 ENCOUNTER — Telehealth: Payer: Self-pay | Admitting: Pharmacist

## 2020-11-15 NOTE — Chronic Care Management (AMB) (Signed)
Chronic Care Management Pharmacy Assistant   Name: Kimberly [name]  MRN: [id number] DOB: 05-26-1951  Reason for Encounter: Patient Assistance Coordination   11/15/2020- Filling out applications for Wilder Glade with [place] [employer] [place] patient assistance program and Tyler Aas with [employer] Patient assistance program. Called patient to inform of instructions by Jeni Salles, CPP and Dr Dwyane Dee, patient answered, started to inform but patient would need to call me back, not good cell phone range and afraid our call would drop. Awaiting return call from patient.   12/05/2020- Called patient back to follow up on Patient assistance application. Patient is at her sister's and would like for me to call her back this afternoon.  12/12/2020- Called patient back, spoke with patient, she is aware of the instructions form Dr Dwyane Dee and Jeni Salles, CPP. Patient aware to continue Invokana until she receives determination from [place] and to return application to Endocrinology office.     Medications: Outpatient Encounter Medications as of 11/15/2020  Medication Sig   Accu-Chek FastClix Lancets MISC Use as instructed to check blood sugar 4 times daily.   ACCU-CHEK GUIDE test strip USE ACCU CHECK GUIDE TEST STRIPS AS INSTRUCTED TO CHECK BLOOD SUGAR 4 TIMES DAILY.   albuterol (VENTOLIN HFA) 108 (90 Base) MCG/ACT inhaler INHALE 2 PUFFS INTO THE LUNGS EVERY 6 HOURS AS NEEDED FOR SHORTNESS OF BREATH OR WHEEZING   aspirin 325 MG EC tablet Take 325 mg by mouth every morning.   atorvastatin (LIPITOR) 80 MG tablet TAKE 1 TABLET BY MOUTH EVERY DAY   Blood Glucose Monitoring Suppl (ACCU-CHEK GUIDE) w/Device KIT 1 each by Does not apply route 4 (four) times daily. Use Accu Chek to check blood sugar 4 times daily.   ezetimibe (ZETIA) 10 MG tablet TAKE 1 TABLET BY MOUTH EVERY DAY   insulin degludec (TRESIBA FLEXTOUCH) 200 UNIT/ML FlexTouch Pen Inject 42 Units into the skin daily.   Insulin Pen Needle (BD PEN NEEDLE NANO U/F)  32G X 4 MM MISC Use as instructed to inject 3 insulin 3 times daily.   INVOKANA 300 MG TABS tablet TAKE 1 TABLET BY MOUTH EVERY DAY BEFORE BREAKFAST   levothyroxine (SYNTHROID) 50 MCG tablet TAKE 1 TABLET BY MOUTH EVERY DAY   lisinopril (ZESTRIL) 20 MG tablet Take 1 tablet (20 mg total) by mouth daily.   NOVOLOG FLEXPEN 100 UNIT/ML FlexPen INJECT 18 UNITS UNDER THE SKIN AT LARGEST MEAL AND 10 UNITS AT DINNER   No facility-administered encounter medications on file as of 11/15/2020.    Star Rating Drugs: Atorvastatin 80 mg- Last filled 11/05/2020, no pharmacy name Invokana 300 mg- Last filled 08/16/2020 for 90 day supply at [facility]. Lisinopril 20 mg- Last filled 11/05/2020, no pharmacy name Lisinopril- HCTZ 20-12.5 mg- Last filled 01/30/2020 for 30 day supply at [facility] ( Patient no longer taking)  SIG: Pattricia Boss, [place] Pharmacist Assistant [phone]

## 2020-11-27 ENCOUNTER — Telehealth: Payer: Self-pay | Admitting: Pharmacist

## 2020-11-27 NOTE — Chronic Care Management (AMB) (Signed)
    Chronic Care Management Pharmacy Assistant   Name: Kimberly [name]  MRN: [id number] DOB: Jun 18, 1950  Reason for Encounter: Disease State   Conditions to be addressed/monitored: DMII  Recent office visits:  None  Recent consult visits:  05.23.2022 [name], MD Endocrinology phone call: change in therapy Insulin Degludec 42 Units Subcutaneous Daily  Hospital visits:  None in previous 6 months  Medications: Outpatient Encounter Medications as of 11/27/2020  Medication Sig   Accu-Chek FastClix Lancets MISC Use as instructed to check blood sugar 4 times daily.   ACCU-CHEK GUIDE test strip USE ACCU CHECK GUIDE TEST STRIPS AS INSTRUCTED TO CHECK BLOOD SUGAR 4 TIMES DAILY.   albuterol (VENTOLIN HFA) 108 (90 Base) MCG/ACT inhaler INHALE 2 PUFFS INTO THE LUNGS EVERY 6 HOURS AS NEEDED FOR SHORTNESS OF BREATH OR WHEEZING   aspirin 325 MG EC tablet Take 325 mg by mouth every morning.   atorvastatin (LIPITOR) 80 MG tablet TAKE 1 TABLET BY MOUTH EVERY DAY   Blood Glucose Monitoring Suppl (ACCU-CHEK GUIDE) w/Device KIT 1 each by Does not apply route 4 (four) times daily. Use Accu Chek to check blood sugar 4 times daily.   ezetimibe (ZETIA) 10 MG tablet TAKE 1 TABLET BY MOUTH EVERY DAY   insulin degludec (TRESIBA FLEXTOUCH) 200 UNIT/ML FlexTouch Pen Inject 42 Units into the skin daily.   Insulin Pen Needle (BD PEN NEEDLE NANO U/F) 32G X 4 MM MISC Use as instructed to inject 3 insulin 3 times daily.   INVOKANA 300 MG TABS tablet TAKE 1 TABLET BY MOUTH EVERY DAY BEFORE BREAKFAST   levothyroxine (SYNTHROID) 50 MCG tablet TAKE 1 TABLET BY MOUTH EVERY DAY   lisinopril (ZESTRIL) 20 MG tablet Take 1 tablet (20 mg total) by mouth daily.   NOVOLOG FLEXPEN 100 UNIT/ML FlexPen INJECT 18 UNITS UNDER THE SKIN AT LARGEST MEAL AND 10 UNITS AT DINNER   No facility-administered encounter medications on file as of 11/27/2020.   I called to speak with the patient about medication adherence. She stated that she  was not feeling good at this time, and she was unable to talk on the phone.  She stated that she was not feeling. I will call the patient back in about 1 week and follow up with a diabetes assessment.    Star Rating Drugs: Medication Dispensed  Quantity Pharmacy  Atorvastatin 80 [facility]05[facility]2022 [facility]  Lisinopril 20 mg [facility]02[facility]2022 [facility]  Called in verified last dispense date of medications  Amilia Revonda Standard, [place] Pharmacist Assistant [phone]

## 2020-12-23 ENCOUNTER — Other Ambulatory Visit: Payer: Self-pay | Admitting: Internal Medicine

## 2020-12-26 ENCOUNTER — Other Ambulatory Visit (INDEPENDENT_AMBULATORY_CARE_PROVIDER_SITE_OTHER): Payer: Medicare HMO

## 2020-12-26 ENCOUNTER — Other Ambulatory Visit: Payer: Self-pay

## 2020-12-26 DIAGNOSIS — E1165 Type 2 diabetes mellitus with hyperglycemia: Secondary | ICD-10-CM | POA: Diagnosis not present

## 2020-12-26 DIAGNOSIS — Z794 Long term (current) use of insulin: Secondary | ICD-10-CM

## 2020-12-26 LAB — COMPREHENSIVE METABOLIC PANEL
ALT: 24 U/L (ref 0–35)
AST: 21 U/L (ref 0–37)
Albumin: 4.5 g/dL (ref 3.5–5.2)
Alkaline Phosphatase: 85 U/L (ref 39–117)
BUN: 21 mg/dL (ref 6–23)
CO2: 26 mEq/L (ref 19–32)
Calcium: 10 mg/dL (ref 8.4–10.5)
Chloride: 104 mEq/L (ref 96–112)
Creatinine, Ser: 0.78 mg/dL (ref 0.40–1.20)
GFR: 77.72 mL/min (ref >60.00)
Glucose, Bld: 144 mg/dL — ABNORMAL HIGH (ref 70–99)
Potassium: 4.7 mEq/L (ref 3.5–5.1)
Sodium: 138 mEq/L (ref 135–145)
Total Bilirubin: 0.6 mg/dL (ref 0.2–1.2)
Total Protein: 7.3 g/dL (ref 6.0–8.3)

## 2020-12-26 LAB — T4, FREE: Free T4: 0.97 ng/dL (ref 0.60–1.60)

## 2020-12-26 LAB — LIPID PANEL
Cholesterol: 126 mg/dL (ref 0–200)
HDL: 26.5 mg/dL — ABNORMAL LOW (ref >39.00)
NonHDL: 99.56
Total CHOL/HDL Ratio: 5
Triglycerides: 229 mg/dL — ABNORMAL HIGH (ref 0.0–149.0)
VLDL: 45.8 mg/dL — ABNORMAL HIGH (ref 0.0–40.0)

## 2020-12-26 LAB — MICROALBUMIN / CREATININE URINE RATIO
Creatinine,U: 45 mg/dL
Microalb Creat Ratio: 2.1 mg/g (ref 0.0–30.0)
Microalb, Ur: 1 mg/dL (ref 0.0–1.9)

## 2020-12-26 LAB — HEMOGLOBIN A1C: Hgb A1c MFr Bld: 7.4 % — ABNORMAL HIGH (ref 4.6–6.5)

## 2020-12-26 LAB — LDL CHOLESTEROL, DIRECT: Direct LDL: 65 mg/dL

## 2020-12-26 LAB — TSH: TSH: 2.68 u[IU]/mL (ref 0.35–5.50)

## 2020-12-28 ENCOUNTER — Other Ambulatory Visit: Payer: Self-pay | Admitting: Endocrinology

## 2021-01-02 ENCOUNTER — Other Ambulatory Visit: Payer: Medicare HMO

## 2021-01-03 NOTE — Progress Notes (Signed)
Patient ID: Kimberly [name], female   DOB: 1951/04/30, 70 y.o.   MRN: [id number]           Reason for Appointment: Endocrinology follow-up    History of Present Illness:          Date of diagnosis of type 2 diabetes mellitus: 1995        Background history:   For several years she had been tried on oral agents with variable control.  Detailed history not available She thinks that she had nausea and vomiting with metformin and could not take much Also had been tried on Amaryl  She thinks she had swelling with Januvia and not clear what other medications she has tried before  She was started on insulin in 07/2013 when her A1c was over 11% Because of her poor control and A1c of 9.1 she was started on Invokana 100 mg daily in 12/16  Recent history:   INSULIN regimen is: Tresiba U-200, 43 hs   Novolog 18 units before lunch. Novolog 10 acs    Non-insulin hypoglycemic drugs the patient is taking are:  Invokana 300 mg daily  Her A1c is slightly better at 7.4, previously was the same at 7.7  A1c previously has been as low as 6.7   Current blood sugar patterns, management and problems identified: She did bring her monitor She appears to have better and more stable fasting blood sugars with switching from Lantus to [place] about a month or so ago clear why her A1c is trending higher even though her blood sugars are still averaging about the same She has fairly stable blood sugars most of the time and only once blood sugar was low when she is eating for lunch and more active Otherwise does have some variability in blood sugars after evening meal but usually below 180 Weight has leveled off She is not doing much formal exercise except walking indoors at home or shopping areas Taking Tresiba at the same time at night No change in renal function with 300 mg Invokana       Side effects from medications have been: Nausea from metformin,?  Swelling from Januvia  Glucose monitoring:  done 1-2  times a day         Glucometer:  Accu-Chek       Blood Glucose readings from download:   PRE-MEAL Fasting Lunch Dinner Bedtime Overall  Glucose range:  65   65-232  Mean/median: 119    127   POST-MEAL PC Breakfast PC Lunch PC Dinner  Glucose range:     Mean/median:  127 139   Previously:  PRE-MEAL Fasting Lunch Dinner Bedtime Overall  Glucose range: 94-164     66-192  Mean/median:  134    124   POST-MEAL PC Breakfast PC Lunch PC Dinner  Glucose range:   95-182  68-192  Mean/median:   123  111     Self-care: The diet that the patient has been following is: tries to limit fats, breads.     Meal times are:  Lunch: 12 pm Dinner: 5-7 PM   Typical meal intake: Breakfast is minimal          Dietician visit, most recent: 09/2015                Weight history:  Wt Readings from Last 3 Encounters:  01/04/21 165 lb (74.8 kg)  08/31/20 165 lb (74.8 kg)  04/27/20 161 lb 6.4 oz (73.2 kg)    Glycemic control:  Lab Results  Component Value Date   HGBA1C 7.4 (H) 12/26/2020   HGBA1C 7.7 (H) 08/28/2020   HGBA1C 7.5 (H) 04/24/2020   Lab Results  Component Value Date   MICROALBUR 1.0 12/26/2020   LDLCALC 51 01/31/2020   CREATININE 0.78 12/26/2020    Other active problems discussed today: See review of systems   Allergies as of 01/04/2021       Reactions   Metformin And Related Nausea And Vomiting   After one pill at hospital   Januvia [sitagliptin Phosphate] Other (See Comments)   Swelling and edema see 4 13 OV        Medication List        Accurate as of January 04, 2021 11:59 PM. If you have any questions, ask your nurse or doctor.          Accu-Chek FastClix Lancets Misc Use as instructed to check blood sugar 4 times daily.   Accu-Chek Guide test strip Generic drug: glucose blood USE ACCU CHECK GUIDE TEST STRIPS AS INSTRUCTED TO CHECK BLOOD SUGAR 4 TIMES DAILY.   Accu-Chek Guide w/Device Kit 1 each by Does not apply route 4 (four) times daily. Use Accu  Chek to check blood sugar 4 times daily.   albuterol 108 (90 Base) MCG/ACT inhaler Commonly known as: VENTOLIN HFA INHALE 2 PUFFS INTO THE LUNGS EVERY 6 HOURS AS NEEDED FOR SHORTNESS OF BREATH OR WHEEZING   aspirin 325 MG EC tablet Take 325 mg by mouth every morning.   atorvastatin 80 MG tablet Commonly known as: LIPITOR TAKE 1 TABLET BY MOUTH EVERY DAY   BD Pen Needle Nano U/F 32G X 4 MM Misc Generic drug: Insulin Pen Needle Use as instructed to inject 3 insulin 3 times daily.   ezetimibe 10 MG tablet Commonly known as: ZETIA TAKE 1 TABLET BY MOUTH EVERY DAY   Invokana 300 MG Tabs tablet Generic drug: canagliflozin TAKE 1 TABLET BY MOUTH EVERY DAY BEFORE BREAKFAST   levothyroxine 50 MCG tablet Commonly known as: SYNTHROID TAKE 1 TABLET BY MOUTH EVERY DAY   lisinopril 20 MG tablet Commonly known as: ZESTRIL TAKE 1 TABLET BY MOUTH EVERY DAY   NovoLOG FlexPen 100 UNIT/ML FlexPen Generic drug: insulin aspart INJECT 18 UNITS UNDER THE SKIN AT LARGEST MEAL AND 10 UNITS AT [place] [place]   Tresiba FlexTouch 200 UNIT/ML FlexTouch Pen Generic drug: insulin degludec Inject 42 Units into the skin daily.        Allergies:  Allergies  Allergen Reactions   Metformin And Related Nausea And Vomiting    After one pill at hospital   Januvia [Sitagliptin Phosphate] Other (See Comments)    Swelling and edema see 4 13 OV    Past Medical History:  Diagnosis Date   CAD (coronary artery disease)    Colitis    Colitis, indeterminate 01/31/2012   COPD (chronic obstructive pulmonary disease) ([place])    Depression 05/16/2017   Depression with anxiety 05/30/2016   Diverticulitis 02/10/2012   DM (diabetes mellitus) ([facility])    Glaucoma    Glaucoma    History of ventricular fibrillation    HTN (hypertension)    Myocardial infarction ([place])    12/1998, 01/2011   Osteoporosis    PVD (peripheral vascular disease) ([place])     Past Surgical History:  Procedure Laterality Date   ABDOMINAL  HYSTERECTOMY     endometriosis   CABG x 3  01/22/2011   Dr Cyndia Bent   CARDIAC CATHETERIZATION  01/17/2011   Dr  Beins   LIPOMA EXCISION Left 02/08/2015   Procedure: EXCISION OF LEFT THIGH LIPOMA;  Surgeon: Greer Pickerel, MD;  Location: [facility];  Service: General;  Laterality: Left;   S/P aortobifemoral bypass  2008   Dr Donnetta Hutching   S/P myocardial infarction and PCI of the lt circumflex  2000    Family History  Problem Relation Age of Onset   Heart attack Father    Diabetes Father    Heart disease Father    Diabetes Brother    Diabetes Mother    Liver cancer Mother    Hypertension Other    Thyroid disease Other        sisters- sister had parathyroidectomy   Alcohol abuse Other        brother   Colon polyps Maternal Uncle    Breast cancer Sister    Diabetes Sister    Thyroid disease Sister     Social History:  reports that she has been smoking cigarettes. She has been smoking an average of 1 pack per day. She has never used smokeless tobacco. She reports that she does not drink alcohol and does not use drugs.    Review of Systems    Lipid history: On 80 mg Lipitor with history of CAD Prescribed by PCP  Her LDL is last improved with mildly increased triglycerides  Lab Results  Component Value Date   CHOL 126 12/26/2020   CHOL 110 01/31/2020   CHOL 112 02/03/2019   Lab Results  Component Value Date   HDL 26.50 (L) 12/26/2020   HDL 23.80 (L) 01/31/2020   HDL 26.20 (L) 02/03/2019   Lab Results  Component Value Date   LDLCALC 51 01/31/2020   LDLCALC 50 02/03/2019   LDLCALC 87 05/20/2018   Lab Results  Component Value Date   TRIG 229.0 (H) 12/26/2020   TRIG 176.0 (H) 01/31/2020   TRIG 178.0 (H) 02/03/2019   Lab Results  Component Value Date   CHOLHDL 5 12/26/2020   CHOLHDL 5 01/31/2020   CHOLHDL 4 02/03/2019   Lab Results  Component Value Date   LDLDIRECT 65.0 12/26/2020   LDLDIRECT 54.0 11/24/2018   LDLDIRECT 87.0 04/24/2015             Hypertension: Present for several years  Blood pressure at home: Not being checked  taking LISINOPRIL 20 mg daily This was changed in the last visit because of   microalbuminuria which is back to normal  BP Readings from Last 3 Encounters:  01/04/21 124/60  08/31/20 122/80  04/27/20 126/78    HYPERCALCEMIA: She has had periodic mild hypercalcemia since about 2010; PTH levels in the mid-upper range Calcium is upper normal and stable  Lab Results  Component Value Date   CALCIUM 10.0 12/26/2020    She is taking 1000 units of vitamin D with adequate vitamin D levels  OSTEOPENIA:  No history of fractures  Her last bone density was in 4/22  Results:   Lumbar spine L1-L4(L3) Femoral neck (FN)  T-score 0.2 RFN: -1.0 LFN: -1.3  Change in BMD from previous DXA test (%) Down 0.6% Down 2.0%    She was tried on Evista but she says it was causing her hot flashes and night sweats and she stopped taking this  Previously in 07/2015 T score -2.3 was at the wrist, this was not measured in 2019  Most recent eye exam was normal in 5/21 and she does go every year  Most recent foot exam:  07/2018 by PCP  HYPOTHYROIDISM:  She has a family history of hypothyroidism, Graves' disease and thyroid nodules  On her visit in 11/20 she was complaining of feeling more tired as well as colder; had some weight gain also.   Baseline TSH was 6  With levothyroxine 25 mcg daily she did not feel any better and with TSH still 4.5 she was told to go up to 50 mcg in 07/2019 With a higher dose she has less fatigue and still feels fairly good now She has been regular with her dosage every morning  Her TSH is consistently normal now  Lab Results  Component Value Date   TSH 2.68 12/26/2020   TSH 3.62 08/28/2020   TSH 3.28 01/31/2020   FREET4 0.97 12/26/2020   FREET4 1.11 01/31/2020   FREET4 1.23 10/24/2019     Review of Systems    LABS:  No visits with results within 1 Week(s) from this  visit.  Latest known visit with results is:  Lab on 12/26/2020  Component Date Value Ref Range Status   Free T4 12/26/2020 0.97  0.60 - 1.60 ng/dL Final   Comment: Specimens from patients who are undergoing biotin therapy and /or ingesting biotin supplements may contain high levels of biotin.  The higher biotin concentration in these specimens interferes with this Free T4 assay.  Specimens that contain high levels  of biotin may cause false high results for this Free T4 assay.  Please interpret results in light of the total clinical presentation of the patient.     TSH 12/26/2020 2.68  0.35 - 5.50 uIU/mL Final   Microalb, Ur 12/26/2020 1.0  0.0 - 1.9 mg/dL Final   Creatinine,U 12/26/2020 45.0  mg/dL Final   Microalb Creat Ratio 12/26/2020 2.1  0.0 - 30.0 mg/g Final   Cholesterol 12/26/2020 126  0 - 200 mg/dL Final   ATP III Classification       Desirable:  < 200 mg/dL               Borderline High:  200 - 239 mg/dL          High:  > = 240 mg/dL   Triglycerides 12/26/2020 229.0 (A) 0.0 - 149.0 mg/dL Final   Normal:  <150 mg/dLBorderline High:  150 - 199 mg/dL   HDL 12/26/2020 26.50 (A) >39.00 mg/dL Final   VLDL 12/26/2020 45.8 (A) 0.0 - 40.0 mg/dL Final   Total CHOL/HDL Ratio 12/26/2020 5   Final                  Men          Women1/2 Average Risk     3.4          3.3Average Risk          5.0          4.42X Average Risk          9.6          7.13X Average Risk          15.0          11.0                       NonHDL 12/26/2020 99.56   Final   NOTE:  Non-HDL goal should be 30 mg/dL higher than patient's LDL goal (i.e. LDL goal of < 70 mg/dL, would have non-HDL goal of < 100 mg/dL)   Sodium 12/26/2020 138  135 -  145 mEq/L Final   Potassium 12/26/2020 4.7  3.5 - 5.1 mEq/L Final   Chloride 12/26/2020 104  96 - 112 mEq/L Final   CO2 12/26/2020 26  19 - 32 mEq/L Final   Glucose, Bld 12/26/2020 144 (A) 70 - 99 mg/dL Final   BUN 12/26/2020 21  6 - 23 mg/dL Final   Creatinine, Ser 12/26/2020 0.78   0.40 - 1.20 mg/dL Final   Total Bilirubin 12/26/2020 0.6  0.2 - 1.2 mg/dL Final   Alkaline Phosphatase 12/26/2020 85  39 - 117 U/L Final   AST 12/26/2020 21  0 - 37 U/L Final   ALT 12/26/2020 24  0 - 35 U/L Final   Total Protein 12/26/2020 7.3  6.0 - 8.3 g/dL Final   Albumin 12/26/2020 4.5  3.5 - 5.2 g/dL Final   GFR 12/26/2020 77.72  >60.00 mL/min Final   Calculated using the CKD-EPI Creatinine Equation (2021)   Calcium 12/26/2020 10.0  8.4 - 10.5 mg/dL Final   Hgb A1c MFr Bld 12/26/2020 7.4 (A) 4.6 - 6.5 % Final   Glycemic Control Guidelines for People with Diabetes:Non Diabetic:  <6%Goal of Therapy: <7%Additional Action Suggested:  >8%    Direct LDL 12/26/2020 65.0  mg/dL Final   Optimal:  <100 mg/dLNear or Above Optimal:  100-129 mg/dLBorderline High:  130-159 mg/dLHigh:  160-189 mg/dLVery High:  >190 mg/dL    Physical Examination:  BP 124/60   Pulse 82   Ht $R'5\' 4"'HZ$  (1.626 m)   Wt 165 lb (74.8 kg)   SpO2 95%   BMI 28.32 kg/m     ASSESSMENT:  Diabetes type 2, mildly obese  See history of present illness for detailed discussion of his current management, blood sugar patterns and problems identified  A1c is 7.4 compared to 7.7  She is on basal bolus insulin with Invokana 300 mg  She has overall fairly good blood sugars including better readings in the mornings with switching to [place] from Lantus Also blood sugars are more stable in the morning Postprandial readings as above Also likely benefiting from Invokana  MICROALBUMINURIA: This is controlled with using 20 mg lisinopril, also has normal renal function  HYPOTHYROIDISM: Mild and controlled on 50 mcg levothyroxine She has normal TSH levels and will continue 50 mcg  HYPERLIPIDEMIA: Managed by PCP, on Lipitor and Zetia but triglycerides continue to be significantly high  PLAN:    She has done somewhat better with [place] instead of Lantus Reminded her not to skip meals and more active have a snack if not able to  eat lunch on time Also if eating dessert she can add another 2 to 4 units to her mealtime dose   History of mild hypercalcemia and mild asymptomatic osteopenia: Bone density shows only minimal change, no treatment needed at this time  Hyperlipidemia: Will recommend changing Zetia to fenofibrate to her PCP    There are no Patient Instructions on file for this visit.  [name] 01/05/2021, 3:15 PM   Note: This office note was prepared with Dragon voice recognition system technology. Any transcriptional errors that result from this process are unintentional.

## 2021-01-04 ENCOUNTER — Ambulatory Visit (INDEPENDENT_AMBULATORY_CARE_PROVIDER_SITE_OTHER): Payer: Medicare HMO | Admitting: Endocrinology

## 2021-01-04 ENCOUNTER — Encounter: Payer: Self-pay | Admitting: Endocrinology

## 2021-01-04 ENCOUNTER — Other Ambulatory Visit: Payer: Self-pay

## 2021-01-04 VITALS — BP 124/60 | HR 82 | Ht 64.0 in | Wt 165.0 lb

## 2021-01-04 DIAGNOSIS — R809 Proteinuria, unspecified: Secondary | ICD-10-CM

## 2021-01-04 DIAGNOSIS — I10 Essential (primary) hypertension: Secondary | ICD-10-CM | POA: Diagnosis not present

## 2021-01-04 DIAGNOSIS — E782 Mixed hyperlipidemia: Secondary | ICD-10-CM

## 2021-01-04 DIAGNOSIS — E1165 Type 2 diabetes mellitus with hyperglycemia: Secondary | ICD-10-CM | POA: Diagnosis not present

## 2021-01-04 DIAGNOSIS — E063 Autoimmune thyroiditis: Secondary | ICD-10-CM

## 2021-01-04 DIAGNOSIS — E1129 Type 2 diabetes mellitus with other diabetic kidney complication: Secondary | ICD-10-CM | POA: Diagnosis not present

## 2021-01-04 DIAGNOSIS — Z794 Long term (current) use of insulin: Secondary | ICD-10-CM

## 2021-01-16 ENCOUNTER — Telehealth: Payer: Self-pay | Admitting: Pharmacist

## 2021-01-16 NOTE — Chronic Care Management (AMB) (Signed)
    Chronic Care Management Pharmacy Assistant   Name: Kimberly [name]  MRN: [id number] DOB: 02-24-1951  01-16-2021 Patient called to remind of appointment with Jeni Salles Clinical Pharmacist on 01-17-21 at 12pm via phone call.  Spoke to patient she advised she wanted to cancel appointment and did not wish to reschedule at this time, she advised she would call back when she was ready.  Care Gaps: Zoster vaccine - Overdue Colonoscopy - Overdue TDAP - Overdue Covid Booster #2 Veterinary surgeon) - Overdue Flu vaccine - Overdue  Star Rating Drug: Atorvastatin 80 mg - [facility] Lisinopril 20 mg -  - [facility]  Any gaps in medications fill history? None  Medications: Outpatient Encounter Medications as of 01/16/2021  Medication Sig   Accu-Chek FastClix Lancets MISC Use as instructed to check blood sugar 4 times daily.   ACCU-CHEK GUIDE test strip USE ACCU CHECK GUIDE TEST STRIPS AS INSTRUCTED TO CHECK BLOOD SUGAR 4 TIMES DAILY.   albuterol (VENTOLIN HFA) 108 (90 Base) MCG/ACT inhaler INHALE 2 PUFFS INTO THE LUNGS EVERY 6 HOURS AS NEEDED FOR SHORTNESS OF BREATH OR WHEEZING   aspirin 325 MG EC tablet Take 325 mg by mouth every morning.   atorvastatin (LIPITOR) 80 MG tablet TAKE 1 TABLET BY MOUTH EVERY DAY   Blood Glucose Monitoring Suppl (ACCU-CHEK GUIDE) w/Device KIT 1 each by Does not apply route 4 (four) times daily. Use Accu Chek to check blood sugar 4 times daily.   ezetimibe (ZETIA) 10 MG tablet TAKE 1 TABLET BY MOUTH EVERY DAY   insulin degludec (TRESIBA FLEXTOUCH) 200 UNIT/ML FlexTouch Pen Inject 42 Units into the skin daily.   Insulin Pen Needle (BD PEN NEEDLE NANO U/F) 32G X 4 MM MISC Use as instructed to inject 3 insulin 3 times daily.   INVOKANA 300 MG TABS tablet TAKE 1 TABLET BY MOUTH EVERY DAY BEFORE BREAKFAST   levothyroxine (SYNTHROID) 50 MCG tablet TAKE 1 TABLET BY MOUTH EVERY DAY   lisinopril (ZESTRIL) 20 MG tablet TAKE 1 TABLET BY MOUTH  EVERY DAY   NOVOLOG FLEXPEN 100 UNIT/ML FlexPen INJECT 18 UNITS UNDER THE SKIN AT LARGEST MEAL AND 10 UNITS AT DINNER   No facility-administered encounter medications on file as of 01/16/2021.    [place] Clinical Pharmacist Assistant [phone]

## 2021-01-17 ENCOUNTER — Telehealth: Payer: Medicare HMO

## 2021-01-22 ENCOUNTER — Other Ambulatory Visit: Payer: Self-pay | Admitting: Endocrinology

## 2021-01-29 ENCOUNTER — Telehealth: Payer: Self-pay | Admitting: Pharmacist

## 2021-01-29 NOTE — Chronic Care Management (AMB) (Signed)
Chronic Care Management Pharmacy Assistant   Name: Kimberly [name]  MRN: [id number] DOB: 1950/10/11  Reason for Encounter: Disease State/ Diabetes Assessment Call   Conditions to be addressed/monitored: DMII  Recent office visits:  None  Recent consult visits:  01-04-2021  [name], MD (Endocrinology) - Patient presented for Uncontrolled type 2 diabetes and other concerns. No medication changes.  Hospital visits:  None in previous 6 months  Medications: Outpatient Encounter Medications as of 01/29/2021  Medication Sig   Accu-Chek FastClix Lancets MISC Use as instructed to check blood sugar 4 times daily.   ACCU-CHEK GUIDE test strip USE ACCU CHECK GUIDE TEST STRIPS AS INSTRUCTED TO CHECK BLOOD SUGAR 4 TIMES DAILY.   albuterol (VENTOLIN HFA) 108 (90 Base) MCG/ACT inhaler INHALE 2 PUFFS INTO THE LUNGS EVERY 6 HOURS AS NEEDED FOR SHORTNESS OF BREATH OR WHEEZING   aspirin 325 MG EC tablet Take 325 mg by mouth every morning.   atorvastatin (LIPITOR) 80 MG tablet TAKE 1 TABLET BY MOUTH EVERY DAY   Blood Glucose Monitoring Suppl (ACCU-CHEK GUIDE) w/Device KIT 1 each by Does not apply route 4 (four) times daily. Use Accu Chek to check blood sugar 4 times daily.   ezetimibe (ZETIA) 10 MG tablet TAKE 1 TABLET BY MOUTH EVERY DAY   insulin degludec (TRESIBA FLEXTOUCH) 200 UNIT/ML FlexTouch Pen Inject 42 Units into the skin daily.   Insulin Pen Needle (BD PEN NEEDLE NANO U/F) 32G X 4 MM MISC Use as instructed to inject 3 insulin 3 times daily.   INVOKANA 300 MG TABS tablet TAKE 1 TABLET BY MOUTH EVERY DAY BEFORE BREAKFAST   levothyroxine (SYNTHROID) 50 MCG tablet TAKE 1 TABLET BY MOUTH EVERY DAY   lisinopril (ZESTRIL) 20 MG tablet TAKE 1 TABLET BY MOUTH EVERY DAY   NOVOLOG FLEXPEN 100 UNIT/ML FlexPen INJECT 18 UNITS UNDER THE SKIN AT LARGEST MEAL AND 10 UNITS AT DINNER   No facility-administered encounter medications on file as of 01/29/2021.  Recent Relevant Labs: Lab Results  Component  Value Date/Time   HGBA1C 7.4 (H) 12/26/2020 07:48 AM   HGBA1C 7.7 (H) 08/28/2020 08:09 AM   MICROALBUR 1.0 12/26/2020 07:48 AM   MICROALBUR 10.0 (H) 04/24/2020 08:59 AM    Kidney Function Lab Results  Component Value Date/Time   CREATININE 0.78 12/26/2020 07:48 AM   CREATININE 0.78 08/28/2020 08:09 AM   GFR 77.72 12/26/2020 07:48 AM   GFRNONAA 76 (L) 02/01/2012 06:30 AM   GFRAA 88 (L) 02/01/2012 06:30 AM    Current antihyperglycemic regimen:  Invokana 358m, 1 tablet once daily Lantus, 43 units once daily at bedtime Novolog Flexpen, 18 units with largest meal and 10 units at dinner What recent interventions/DTPs have been made to improve glycemic control:   Januvia (swelling), metformin (N/V)  Have there been any recent hospitalizations or ED visits since last visit with CPP? No Patient denies hypoglycemic symptoms, including Pale, Sweaty, Shaky, Hungry, Nervous/irritable, and Vision changes Patient denies hyperglycemic symptoms, including blurry vision, excessive thirst, fatigue, polyuria, and weakness How often are you checking your blood sugar? 3-4 times daily What are your blood sugars ranging? Patient reports they are ranging between 112-120 Patient reports her checks are done:Fasting, Before meals and After meals During the week, how often does your blood glucose drop below 70? Patient reports never Are you checking your feet daily/regularly? Patient reports yes  Adherence Review: Is the patient currently on a STATIN medication? Yes Is the patient currently on ACE/ARB medication? Yes Does  the patient have >5 day gap between last estimated fill dates? No  Notes: Patient reports she is doing well. Patient confirmed she is taking the above medications as prescribed and is not in need of refills on any medication at this time. Offered an appointment to follow up with the Clinical Pharmacist she declined stated she would call when she is ready to follow up. She reports no other  issues or concerns at this time.  Care Gaps: Zoster vaccine - Overdue Colonoscopy - Overdue TDAP - Overdue Covid Booster #2 Veterinary surgeon) - Overdue Flu vaccine - Overdue AWV- Scheduled for 03-26-21  Star Rating Drugs: Atorvastatin (Lipitor) 80 mg - [facility] Lisinopril (Zestril) 20 mg - [facility] filled [facility]29-2020 [facility] Canagliflozin (Invokana) 300 mg - Last filled 10-30-2020 90 DS at [facility] Pharmacist Assistant [phone]

## 2021-02-11 ENCOUNTER — Other Ambulatory Visit: Payer: Self-pay | Admitting: Internal Medicine

## 2021-02-12 ENCOUNTER — Other Ambulatory Visit: Payer: Self-pay | Admitting: Endocrinology

## 2021-02-15 ENCOUNTER — Encounter: Payer: Medicare HMO | Admitting: Internal Medicine

## 2021-02-25 ENCOUNTER — Other Ambulatory Visit: Payer: Self-pay

## 2021-02-26 ENCOUNTER — Ambulatory Visit (INDEPENDENT_AMBULATORY_CARE_PROVIDER_SITE_OTHER): Payer: Medicare HMO | Admitting: Internal Medicine

## 2021-02-26 ENCOUNTER — Encounter: Payer: Self-pay | Admitting: Internal Medicine

## 2021-02-26 ENCOUNTER — Other Ambulatory Visit: Payer: Self-pay | Admitting: Endocrinology

## 2021-02-26 VITALS — BP 138/60 | HR 78 | Temp 98.2°F | Ht 64.0 in | Wt 162.0 lb

## 2021-02-26 DIAGNOSIS — E785 Hyperlipidemia, unspecified: Secondary | ICD-10-CM

## 2021-02-26 DIAGNOSIS — D751 Secondary polycythemia: Secondary | ICD-10-CM | POA: Diagnosis not present

## 2021-02-26 DIAGNOSIS — I10 Essential (primary) hypertension: Secondary | ICD-10-CM | POA: Diagnosis not present

## 2021-02-26 DIAGNOSIS — Z79899 Other long term (current) drug therapy: Secondary | ICD-10-CM

## 2021-02-26 DIAGNOSIS — Z720 Tobacco use: Secondary | ICD-10-CM

## 2021-02-26 DIAGNOSIS — I25810 Atherosclerosis of coronary artery bypass graft(s) without angina pectoris: Secondary | ICD-10-CM

## 2021-02-26 DIAGNOSIS — Z0000 Encounter for general adult medical examination without abnormal findings: Secondary | ICD-10-CM | POA: Diagnosis not present

## 2021-02-26 DIAGNOSIS — Z5320 Procedure and treatment not carried out because of patient's decision for unspecified reasons: Secondary | ICD-10-CM | POA: Diagnosis not present

## 2021-02-26 DIAGNOSIS — E1151 Type 2 diabetes mellitus with diabetic peripheral angiopathy without gangrene: Secondary | ICD-10-CM

## 2021-02-26 DIAGNOSIS — J449 Chronic obstructive pulmonary disease, unspecified: Secondary | ICD-10-CM | POA: Diagnosis not present

## 2021-02-26 DIAGNOSIS — E1169 Type 2 diabetes mellitus with other specified complication: Secondary | ICD-10-CM | POA: Diagnosis not present

## 2021-02-26 DIAGNOSIS — Z23 Encounter for immunization: Secondary | ICD-10-CM | POA: Diagnosis not present

## 2021-02-26 DIAGNOSIS — E782 Mixed hyperlipidemia: Secondary | ICD-10-CM

## 2021-02-26 LAB — LIPID PANEL
Cholesterol: 123 mg/dL (ref 0–200)
HDL: 25.2 mg/dL — ABNORMAL LOW (ref >39.00)
NonHDL: 97.6
Total CHOL/HDL Ratio: 5
Triglycerides: 242 mg/dL — ABNORMAL HIGH (ref 0.0–149.0)
VLDL: 48.4 mg/dL — ABNORMAL HIGH (ref 0.0–40.0)

## 2021-02-26 LAB — CBC WITH DIFFERENTIAL/PLATELET
Basophils Absolute: 0 10*3/uL (ref 0.0–0.1)
Basophils Relative: 0.6 % (ref 0.0–3.0)
Eosinophils Absolute: 0.2 10*3/uL (ref 0.0–0.7)
Eosinophils Relative: 2.5 % (ref 0.0–5.0)
HCT: 49.4 % — ABNORMAL HIGH (ref 36.0–46.0)
Hemoglobin: 16.5 g/dL — ABNORMAL HIGH (ref 12.0–15.0)
Lymphocytes Relative: 20.9 % (ref 12.0–46.0)
Lymphs Abs: 1.5 10*3/uL (ref 0.7–4.0)
MCHC: 33.3 g/dL (ref 30.0–36.0)
MCV: 87.8 fl (ref 78.0–100.0)
Monocytes Absolute: 0.5 10*3/uL (ref 0.1–1.0)
Monocytes Relative: 6.9 % (ref 3.0–12.0)
Neutro Abs: 5 10*3/uL (ref 1.4–7.7)
Neutrophils Relative %: 69.1 % (ref 43.0–77.0)
Platelets: 151 10*3/uL (ref 150.0–400.0)
RBC: 5.62 Mil/uL — ABNORMAL HIGH (ref 3.87–5.11)
RDW: 15 % (ref 11.5–15.5)
WBC: 7.3 10*3/uL (ref 4.0–10.5)

## 2021-02-26 LAB — LDL CHOLESTEROL, DIRECT: Direct LDL: 66 mg/dL

## 2021-02-26 MED ORDER — FENOFIBRATE 145 MG PO TABS
145.0000 mg | ORAL_TABLET | Freq: Every day | ORAL | 1 refills | Status: DC
Start: 2021-02-26 — End: 2021-08-13

## 2021-02-26 NOTE — Patient Instructions (Signed)
Good to see  you today .  Stop the zetia and take fenofibrate to help with the cholesterol and triglycerides . Still advise stop tobacco.  Consider getting second   covid 19 vaccine.as discussed .   Plan fu depending 6-1`2 months  Would get fu lab after beginning  new triglyceride medication  Health Maintenance for Postmenopausal Women Menopause is a normal process in which your ability to get pregnant comes to an end. This process happens slowly over many months or years, usually between the ages of 7 and 56. Menopause is complete when you have missed your menstrual periods for 12 months. It is important to talk with your health care provider about some of the most common conditions that affect women after menopause (postmenopausal women). These include heart disease, cancer, and bone loss (osteoporosis). Adopting a healthy lifestyle and getting preventive care can help to promote your health and wellness. The actions you take can also lower your chances of developing some of these common conditions. What should I know about menopause? During menopause, you may get a number of symptoms, such as: Hot flashes. These can be moderate or severe. Night sweats. Decrease in sex drive. Mood swings. Headaches. Tiredness. Irritability. Memory problems. Insomnia. Choosing to treat or not to treat these symptoms is a decision that you make with your health care provider. Do I need hormone replacement therapy? Hormone replacement therapy is effective in treating symptoms that are caused by menopause, such as hot flashes and night sweats. Hormone replacement carries certain risks, especially as you become older. If you are thinking about using estrogen or estrogen with progestin, discuss the benefits and risks with your health care provider. What is my risk for heart disease and stroke? The risk of heart disease, heart attack, and stroke increases as you age. One of the causes may be a change in the  body's hormones during menopause. This can affect how your body uses dietary fats, triglycerides, and cholesterol. Heart attack and stroke are medical emergencies. There are many things that you can do to help prevent heart disease and stroke. Watch your blood pressure High blood pressure causes heart disease and increases the risk of stroke. This is more likely to develop in people who have high blood pressure readings, are of African descent, or are overweight. Have your blood pressure checked: Every 3-5 years if you are 24-62 years of age. Every year if you are 75 years old or older. Eat a healthy diet  Eat a diet that includes plenty of vegetables, fruits, low-fat dairy products, and lean protein. Do not eat a lot of foods that are high in solid fats, added sugars, or sodium. Get regular exercise Get regular exercise. This is one of the most important things you can do for your health. Most adults should: Try to exercise for at least 150 minutes each week. The exercise should increase your heart rate and make you sweat (moderate-intensity exercise). Try to do strengthening exercises at least twice each week. Do these in addition to the moderate-intensity exercise. Spend less time sitting. Even light physical activity can be beneficial. Other tips Work with your health care provider to achieve or maintain a healthy weight. Do not use any products that contain nicotine or tobacco, such as cigarettes, e-cigarettes, and chewing tobacco. If you need help quitting, ask your health care provider. Know your numbers. Ask your health care provider to check your cholesterol and your blood sugar (glucose). Continue to have your blood tested as directed  by your health care provider. Do I need screening for cancer? Depending on your health history and family history, you may need to have cancer screening at different stages of your life. This may include screening for: Breast cancer. Cervical  cancer. Lung cancer. Colorectal cancer. What is my risk for osteoporosis? After menopause, you may be at increased risk for osteoporosis. Osteoporosis is a condition in which bone destruction happens more quickly than new bone creation. To help prevent osteoporosis or the bone fractures that can happen because of osteoporosis, you may take the following actions: If you are 37-31 years old, get at least 1,000 mg of calcium and at least 600 mg of vitamin D per day. If you are older than age 50 but younger than age 20, get at least 1,200 mg of calcium and at least 600 mg of vitamin D per day. If you are older than age 53, get at least 1,200 mg of calcium and at least 800 mg of vitamin D per day. Smoking and drinking excessive alcohol increase the risk of osteoporosis. Eat foods that are rich in calcium and vitamin D, and do weight-bearing exercises several times each week as directed by your health care provider. How does menopause affect my mental health? Depression may occur at any age, but it is more common as you become older. Common symptoms of depression include: Low or sad mood. Changes in sleep patterns. Changes in appetite or eating patterns. Feeling an overall lack of motivation or enjoyment of activities that you previously enjoyed. Frequent crying spells. Talk with your health care provider if you think that you are experiencing depression. General instructions See your health care provider for regular wellness exams and vaccines. This may include: Scheduling regular health, dental, and eye exams. Getting and maintaining your vaccines. These include: Influenza vaccine. Get this vaccine each year before the flu season begins. Pneumonia vaccine. Shingles vaccine. Tetanus, diphtheria, and pertussis (Tdap) booster vaccine. Your health care provider may also recommend other immunizations. Tell your health care provider if you have ever been abused or do not feel safe at  home. Summary Menopause is a normal process in which your ability to get pregnant comes to an end. This condition causes hot flashes, night sweats, decreased interest in sex, mood swings, headaches, or lack of sleep. Treatment for this condition may include hormone replacement therapy. Take actions to keep yourself healthy, including exercising regularly, eating a healthy diet, watching your weight, and checking your blood pressure and blood sugar levels. Get screened for cancer and depression. Make sure that you are up to date with all your vaccines. This information is not intended to replace advice given to you by your health care provider. Make sure you discuss any questions you have with your health care provider. Document Revised: 05/26/2018 Document Reviewed: 05/26/2018 [employer] Patient Education  2022 [employer].

## 2021-02-26 NOTE — Progress Notes (Addendum)
Chief Complaint  Patient presents with   Annual Exam   Medication Management   Hyperlipidemia    HPI: Patient  Kimberly [name]  70 y.o. comes in today for Preventive Health Care visit and disease management medication. [name] is being seen by Dr. Lucianne Muss endocrinology in regard to her type 2 diabetes and hypothyroidism. She is taking atorvastatin in setting in regard to hyperlipidemia triglycerides still remain a bit high. Advice from Dr. Kirtland Bouchard was to try fenofibrate and stop the Zetia. She continues to use tobacco is aware about 1 pack/day Had 1 COVID-vaccine reluctant to get another because of heard of side effects possibility but she takes great precautions to avoid contact still gets outside does grocery pickup. Denies respiratory compromise and feels that her mood is pretty good.  No claudication neuropathy symptoms vision changes is up-to-date. Declines mammograms. Has psoriasis has seen dermatology has 3 separate creams.  Health Maintenance  Topic Date Due   Zoster Vaccines- Shingrix (1 of 2) Never done   COLONOSCOPY (Pts 45-80yrs Insurance coverage will need to be confirmed)  04/24/2015   TETANUS/TDAP  01/31/2019   FOOT EXAM  08/10/2019   COVID-19 Vaccine (2 - Pfizer risk series) 02/09/2020   HEMOGLOBIN A1C  06/28/2021   OPHTHALMOLOGY EXAM  11/05/2021   INFLUENZA VACCINE  Completed   DEXA SCAN  Completed   Hepatitis C Screening  Completed   PNA vac Low Risk Adult  Completed   HPV VACCINES  Aged Out   Health Maintenance Review LIFESTYLE:  Exercise:  walks in hosues.     Tobacco/ETS: 1  ppd . Alcohol:  no Sugar beverages: no  Sleep:  5-6 hot flashes  Drug use: no HH of 1     ROS:  REST of 12 system review negative except as per HPI no chest pain or cardiac symptoms.   Past Medical History:  Diagnosis Date   CAD (coronary artery disease)    Colitis    Colitis, indeterminate 01/31/2012   COPD (chronic obstructive pulmonary disease) (HCC)    Depression  05/16/2017   Depression with anxiety 05/30/2016   Diverticulitis 02/10/2012   DM (diabetes mellitus) (HCC)    Glaucoma    Glaucoma    History of ventricular fibrillation    HTN (hypertension)    Myocardial infarction (HCC)    12/1998, 01/2011   Osteoporosis    PVD (peripheral vascular disease) (HCC)     Past Surgical History:  Procedure Laterality Date   ABDOMINAL HYSTERECTOMY     endometriosis   CABG x 3  01/22/2011   Dr Laneta Simmers   CARDIAC CATHETERIZATION  01/17/2011   Dr Alger Simons   LIPOMA EXCISION Left 02/08/2015   Procedure: EXCISION OF LEFT THIGH LIPOMA;  Surgeon: Gaynelle Adu, MD;  Location: [facility];  Service: General;  Laterality: Left;   S/P aortobifemoral bypass  2008   Dr Arbie Cookey   S/P myocardial infarction and PCI of the lt circumflex  2000    Family History  Problem Relation Age of Onset   Heart attack Father    Diabetes Father    Heart disease Father    Diabetes Brother    Diabetes Mother    Liver cancer Mother    Hypertension Other    Thyroid disease Other        sisters- sister had parathyroidectomy   Alcohol abuse Other        brother   Colon polyps Maternal Uncle    Breast cancer Sister  Diabetes Sister    Thyroid disease Sister     Social History   Socioeconomic History   Marital status: Married    Spouse name: Not on file   Number of children: 3   Years of education: Not on file   Highest education level: Not on file  Occupational History   Occupation: housewife    Employer: UNEMPLOYED  Tobacco Use   Smoking status: Every Day    Packs/day: 1.00    Types: Cigarettes   Smokeless tobacco: Never  Vaping Use   Vaping Use: Former  Substance and Sexual Activity   Alcohol use: No   Drug use: No   Sexual activity: Not on file  Other Topics Concern   Not on file  Social History Narrative   t widowed    Regular exercise- yes not recently       Quit tobacco in August at heart surgery. Restarted    Husband diagnosed with liver  cancer since 2014 passed April 2015    She was care taker.   No pets   Social Determinants of Radio broadcast assistant Strain: Low Risk    Difficulty of Paying Living Expenses: Not hard at all  Food Insecurity: No Food Insecurity   Worried About Charity fundraiser in the Last Year: Never true   Arboriculturist in the Last Year: Never true  Transportation Needs: No Transportation Needs   Lack of Transportation (Medical): No   Lack of Transportation (Non-Medical): No  Physical Activity: Insufficiently Active   Days of Exercise per Week: 5 days   Minutes of Exercise per Session: 10 min  Stress: No Stress Concern Present   Feeling of Stress : Not at all  Social Connections: Socially Isolated   Frequency of Communication with Friends and Family: More than three times a week   Frequency of Social Gatherings with Friends and Family: Three times a week   Attends Religious Services: Never   Active Member of Clubs or Organizations: No   Attends Archivist Meetings: Never   Marital Status: Widowed    Outpatient Medications Prior to Visit  Medication Sig Dispense Refill   Accu-Chek FastClix Lancets MISC Use as instructed to check blood sugar 4 times daily. 150 each 2   ACCU-CHEK GUIDE test strip USE ACCU CHECK GUIDE TEST STRIPS AS INSTRUCTED TO CHECK BLOOD SUGAR 4 TIMES DAILY. 150 strip 2   albuterol (VENTOLIN HFA) 108 (90 Base) MCG/ACT inhaler INHALE 2 PUFFS INTO THE LUNGS EVERY 6 HOURS AS NEEDED FOR SHORTNESS OF BREATH OR WHEEZING 6.7 each 2   aspirin 325 MG EC tablet Take 325 mg by mouth every morning.     atorvastatin (LIPITOR) 80 MG tablet TAKE 1 TABLET BY MOUTH EVERY DAY 90 tablet 2   Blood Glucose Monitoring Suppl (ACCU-CHEK GUIDE) w/Device KIT 1 each by Does not apply route 4 (four) times daily. Use Accu Chek to check blood sugar 4 times daily. 1 kit 0   ezetimibe (ZETIA) 10 MG tablet TAKE 1 TABLET BY MOUTH EVERY DAY 90 tablet 2   Insulin Pen Needle (BD PEN NEEDLE NANO  U/F) 32G X 4 MM MISC Use as instructed to inject 3 insulin 3 times daily. 100 each 3   INVOKANA 300 MG TABS tablet TAKE 1 TABLET BY MOUTH EVERY DAY BEFORE BREAKFAST 90 tablet 1   levothyroxine (SYNTHROID) 50 MCG tablet TAKE 1 TABLET BY MOUTH EVERY DAY 90 tablet 3   lisinopril (ZESTRIL) 20  MG tablet TAKE 1 TABLET BY MOUTH EVERY DAY 90 tablet 3   NOVOLOG FLEXPEN 100 UNIT/ML FlexPen INJECT 18 UNITS UNDER THE SKIN AT LARGEST MEAL AND 10 UNITS AT DINNER 3 mL 3   TRESIBA FLEXTOUCH 200 UNIT/ML FlexTouch Pen INJECT 42 UNITS INTO THE SKIN DAILY. 3 mL 2   No facility-administered medications prior to visit.     EXAM:  BP 138/60 (BP Location: Left Arm, Patient Position: Sitting, Cuff Size: Normal)   Pulse 78   Temp 98.2 F (36.8 C) (Oral)   Ht $R'5\' 4"'XD$  (1.626 m)   Wt 162 lb (73.5 kg)   SpO2 95%   BMI 27.81 kg/m   Body mass index is 27.81 kg/m. Wt Readings from Last 3 Encounters:  02/26/21 162 lb (73.5 kg)  01/04/21 165 lb (74.8 kg)  08/31/20 165 lb (74.8 kg)    Physical Exam: Vital signs reviewed QMV:HQIO is a well-developed well-nourished alert cooperative    who appearsr stated age in no acute distress.  HEENT: normocephalic atraumatic , Eyes: PERRL EOM's full, conjunctiva muddy but clear clear, ., Ears: no deformity EAC's clear TMs with normal landmarks. Mouth: Masked NECK: supple without masses, thyromegaly or bruits. CHEST/PULM:  Clear to auscultation and percussion breath sounds equal no wheeze , rales or rhonchi. No chest Willhelm deformities or tenderness.  Well-healed midline scar Breast: normal by inspection . No dimpling, discharge, masses, tenderness or discharge . CV: PMI is nondisplaced, S1 S2 no gallops, murmurs, rubs. Peripheral pulses are present without delay.No JVD .  ABDOMEN: Bowel sounds normal nontender  No guard or rebound, no hepato splenomegal no CVA tenderness.   Extremtities:  No clubbing cyanosis or edema, no acute joint swelling or redness no focal atrophy feet some  shiny skin but equal DP pulses sensation to microfilament is intact no ulcers NEURO:  Oriented x3, cranial nerves 3-12 appear to be intact, no obvious focal weakness,gait within normal limits no abnormal reflexes or asymmetrical SKIN: No acute rashes normal turgor, color, no bruising or petechiae.  Scattered small patches psoriatic legs arms. PSYCH: Oriented, good eye contact, no obvious depression anxiety, cognition and judgment appear normal. LN: no cervical axillary inguinal adenopathy Diabetic Foot Exam - Simple   Simple Foot Form Visual Inspection No deformities, no ulcerations, no other skin breakdown bilaterally: Yes See comments: Yes Sensation Testing Intact to touch and monofilament testing bilaterally: Yes Pulse Check Posterior Tibialis and Dorsalis pulse intact bilaterally: Yes Comments Shiny toes and distal  but  nl cap refill      Lab Results  Component Value Date   WBC 6.7 08/09/2018   HGB 16.7 (H) 08/09/2018   HCT 48.8 (H) 08/09/2018   PLT 176.0 08/09/2018   GLUCOSE 144 (H) 12/26/2020   CHOL 126 12/26/2020   TRIG 229.0 (H) 12/26/2020   HDL 26.50 (L) 12/26/2020   LDLDIRECT 65.0 12/26/2020   LDLCALC 51 01/31/2020   ALT 24 12/26/2020   AST 21 12/26/2020   NA 138 12/26/2020   K 4.7 12/26/2020   CL 104 12/26/2020   CREATININE 0.78 12/26/2020   BUN 21 12/26/2020   CO2 26 12/26/2020   TSH 2.68 12/26/2020   INR 1.06 02/01/2012   HGBA1C 7.4 (H) 12/26/2020   MICROALBUR 1.0 12/26/2020    BP Readings from Last 3 Encounters:  02/26/21 138/60  01/04/21 124/60  08/31/20 122/80   Hyperlipidemia: Will recommend changing Zetia to fenofibrate to her PCP Lab results reviewed with patient   ASSESSMENT AND PLAN:  Discussed  the following assessment and plan:    ICD-10-CM   1. Visit for preventive health examination  Z00.00     2. Need for influenza vaccination  Z23 Flu Vaccine QUAD High Dose(Fluad)    3. Diabetes mellitus with peripheral vascular disease (HCC)   E11.51 CBC with Differential/Platelet    Lipid panel    Lipid panel    CBC with Differential/Platelet    4. Hyperlipidemia associated with type 2 diabetes mellitus (HCC)  E11.69 CBC with Differential/Platelet   E78.5 Lipid panel    Lipid panel    CBC with Differential/Platelet    5. Medication management  Z79.899 CBC with Differential/Platelet    Lipid panel    Lipid panel    CBC with Differential/Platelet    6. Polycythemia  D75.1 CBC with Differential/Platelet    Lipid panel    Lipid panel    CBC with Differential/Platelet    7. Tobacco use  Z72.0     8. Essential hypertension  I10     9. Atherosclerosis of coronary artery bypass graft of native heart without angina pectoris  I25.810     10. Mammogram declined  Z53.20     Review data encouraged tobacco cessation as in past Will DC Zetia and give a trial of fenofibrate as recommended by endocrinology. Lab today we will plan follow-up lipids at future time possibly with Dr. Dwyane Dee.  Or here. He denies symptoms of respiratory compromise but is not that active at this time. Still mows the lawn has time outside.  Mood appears to be stable. Return for 6-12 mos , depending on results.  Patient Care Team: Aaria Happ, Standley Brooking, MD as PCP - General Cyndia Bent Fernande Boyden, MD (Cardiothoracic Surgery) Early, Arvilla Meres, MD (Thoracic Diseases) [name], MD as Consulting Physician (Endocrinology) Viona Gilmore, [facility] as Pharmacist (Pharmacist) Patient Instructions  Good to see  you today .  Stop the zetia and take fenofibrate to help with the cholesterol and triglycerides . Still advise stop tobacco.  Consider getting second   covid 19 vaccine.as discussed .   Plan fu depending 6-1`2 months  Would get fu lab after beginning  new triglyceride medication  Health Maintenance for Postmenopausal Women Menopause is a normal process in which your ability to get pregnant comes to an end. This process happens slowly over many months or years,  usually between the ages of 39 and 38. Menopause is complete when you have missed your menstrual periods for 12 months. It is important to talk with your health care provider about some of the most common conditions that affect women after menopause (postmenopausal women). These include heart disease, cancer, and bone loss (osteoporosis). Adopting a healthy lifestyle and getting preventive care can help to promote your health and wellness. The actions you take can also lower your chances of developing some of these common conditions. What should I know about menopause? During menopause, you may get a number of symptoms, such as: Hot flashes. These can be moderate or severe. Night sweats. Decrease in sex drive. Mood swings. Headaches. Tiredness. Irritability. Memory problems. Insomnia. Choosing to treat or not to treat these symptoms is a decision that you make with your health care provider. Do I need hormone replacement therapy? Hormone replacement therapy is effective in treating symptoms that are caused by menopause, such as hot flashes and night sweats. Hormone replacement carries certain risks, especially as you become older. If you are thinking about using estrogen or estrogen with progestin, discuss the benefits and  risks with your health care provider. What is my risk for heart disease and stroke? The risk of heart disease, heart attack, and stroke increases as you age. One of the causes may be a change in the body's hormones during menopause. This can affect how your body uses dietary fats, triglycerides, and cholesterol. Heart attack and stroke are medical emergencies. There are many things that you can do to help prevent heart disease and stroke. Watch your blood pressure High blood pressure causes heart disease and increases the risk of stroke. This is more likely to develop in people who have high blood pressure readings, are of African descent, or are overweight. Have your blood  pressure checked: Every 3-5 years if you are 55-27 years of age. Every year if you are 37 years old or older. Eat a healthy diet  Eat a diet that includes plenty of vegetables, fruits, low-fat dairy products, and lean protein. Do not eat a lot of foods that are high in solid fats, added sugars, or sodium. Get regular exercise Get regular exercise. This is one of the most important things you can do for your health. Most adults should: Try to exercise for at least 150 minutes each week. The exercise should increase your heart rate and make you sweat (moderate-intensity exercise). Try to do strengthening exercises at least twice each week. Do these in addition to the moderate-intensity exercise. Spend less time sitting. Even light physical activity can be beneficial. Other tips Work with your health care provider to achieve or maintain a healthy weight. Do not use any products that contain nicotine or tobacco, such as cigarettes, e-cigarettes, and chewing tobacco. If you need help quitting, ask your health care provider. Know your numbers. Ask your health care provider to check your cholesterol and your blood sugar (glucose). Continue to have your blood tested as directed by your health care provider. Do I need screening for cancer? Depending on your health history and family history, you may need to have cancer screening at different stages of your life. This may include screening for: Breast cancer. Cervical cancer. Lung cancer. Colorectal cancer. What is my risk for osteoporosis? After menopause, you may be at increased risk for osteoporosis. Osteoporosis is a condition in which bone destruction happens more quickly than new bone creation. To help prevent osteoporosis or the bone fractures that can happen because of osteoporosis, you may take the following actions: If you are 55-51 years old, get at least 1,000 mg of calcium and at least 600 mg of vitamin D per day. If you are older than  age 45 but younger than age 37, get at least 1,200 mg of calcium and at least 600 mg of vitamin D per day. If you are older than age 52, get at least 1,200 mg of calcium and at least 800 mg of vitamin D per day. Smoking and drinking excessive alcohol increase the risk of osteoporosis. Eat foods that are rich in calcium and vitamin D, and do weight-bearing exercises several times each week as directed by your health care provider. How does menopause affect my mental health? Depression may occur at any age, but it is more common as you become older. Common symptoms of depression include: Low or sad mood. Changes in sleep patterns. Changes in appetite or eating patterns. Feeling an overall lack of motivation or enjoyment of activities that you previously enjoyed. Frequent crying spells. Talk with your health care provider if you think that you are experiencing depression. General instructions  See your health care provider for regular wellness exams and vaccines. This may include: Scheduling regular health, dental, and eye exams. Getting and maintaining your vaccines. These include: Influenza vaccine. Get this vaccine each year before the flu season begins. Pneumonia vaccine. Shingles vaccine. Tetanus, diphtheria, and pertussis (Tdap) booster vaccine. Your health care provider may also recommend other immunizations. Tell your health care provider if you have ever been abused or do not feel safe at home. Summary Menopause is a normal process in which your ability to get pregnant comes to an end. This condition causes hot flashes, night sweats, decreased interest in sex, mood swings, headaches, or lack of sleep. Treatment for this condition may include hormone replacement therapy. Take actions to keep yourself healthy, including exercising regularly, eating a healthy diet, watching your weight, and checking your blood pressure and blood sugar levels. Get screened for cancer and depression. Make  sure that you are up to date with all your vaccines. This information is not intended to replace advice given to you by your health care provider. Make sure you discuss any questions you have with your health care provider. Document Revised: 05/26/2018 Document Reviewed: 05/26/2018 Elsevier Patient Education  2022 [place]. Briscoe Daniello M.D.

## 2021-03-03 NOTE — Progress Notes (Signed)
Blood count stable  ldl at goal   triglycerides up as we suspected . Proceed with taking the fenofibrate  . Dr Dwyane Dee  agrees to repeat the lipid panel with his next  blood testing to see if helping.

## 2021-03-04 ENCOUNTER — Other Ambulatory Visit: Payer: Self-pay | Admitting: Endocrinology

## 2021-03-25 ENCOUNTER — Telehealth: Payer: Self-pay | Admitting: Pharmacist

## 2021-03-25 NOTE — Chronic Care Management (AMB) (Signed)
Chronic Care Management Pharmacy Assistant   Name: Kimberly [name]  MRN: [id number] DOB: 10-Jan-1951   Reason for Encounter: Disease State / Diabetes Assessment Call    Conditions to be addressed/monitored: DMII  Recent office visits:  02/26/2021 Burnis Medin, MD - Patient presented for Preventative Health exam and other concerns. Prescribed Fenofibrate 145 mg.  Recent consult visits:  None  Hospital visits:  None in previous 6 months  Medications: Outpatient Encounter Medications as of 03/25/2021  Medication Sig   Accu-Chek FastClix Lancets MISC Use as instructed to check blood sugar 4 times daily.   ACCU-CHEK GUIDE test strip USE ACCU CHECK GUIDE TEST STRIPS AS INSTRUCTED TO CHECK BLOOD SUGAR 4 TIMES DAILY.   albuterol (VENTOLIN HFA) 108 (90 Base) MCG/ACT inhaler INHALE 2 PUFFS INTO THE LUNGS EVERY 6 HOURS AS NEEDED FOR SHORTNESS OF BREATH OR WHEEZING   aspirin 325 MG EC tablet Take 325 mg by mouth every morning.   atorvastatin (LIPITOR) 80 MG tablet TAKE 1 TABLET BY MOUTH EVERY DAY   Blood Glucose Monitoring Suppl (ACCU-CHEK GUIDE) w/Device KIT 1 each by Does not apply route 4 (four) times daily. Use Accu Chek to check blood sugar 4 times daily.   ezetimibe (ZETIA) 10 MG tablet TAKE 1 TABLET BY MOUTH EVERY DAY   fenofibrate (TRICOR) 145 MG tablet Take 1 tablet (145 mg total) by mouth daily. Stop the zetia   Insulin Pen Needle (BD PEN NEEDLE NANO U/F) 32G X 4 MM MISC Use as instructed to inject 3 insulin 3 times daily.   INVOKANA 300 MG TABS tablet TAKE 1 TABLET BY MOUTH EVERY DAY BEFORE BREAKFAST   levothyroxine (SYNTHROID) 50 MCG tablet TAKE 1 TABLET BY MOUTH EVERY DAY   lisinopril (ZESTRIL) 20 MG tablet TAKE 1 TABLET BY MOUTH EVERY DAY   NOVOLOG FLEXPEN 100 UNIT/ML FlexPen INJECT 18 UNITS UNDER THE SKIN AT LARGEST MEAL AND 10 UNITS AT DINNER   TRESIBA FLEXTOUCH 200 UNIT/ML FlexTouch Pen INJECT 42 UNITS INTO THE SKIN DAILY.   No facility-administered encounter medications  on file as of 03/25/2021.  Reviewed chart prior to disease state call. Spoke with patient regarding BP  Recent Office Vitals: BP Readings from Last 3 Encounters:  02/26/21 138/60  01/04/21 124/60  08/31/20 122/80   Pulse Readings from Last 3 Encounters:  02/26/21 78  01/04/21 82  08/31/20 83    Wt Readings from Last 3 Encounters:  02/26/21 162 lb (73.5 kg)  01/04/21 165 lb (74.8 kg)  08/31/20 165 lb (74.8 kg)     Kidney Function Lab Results  Component Value Date/Time   CREATININE 0.78 12/26/2020 07:48 AM   CREATININE 0.78 08/28/2020 08:09 AM   GFR 77.72 12/26/2020 07:48 AM   GFRNONAA 76 (L) 02/01/2012 06:30 AM   GFRAA 88 (L) 02/01/2012 06:30 AM    BMP Latest Ref Rng & Units 12/26/2020 08/28/2020 04/24/2020  Glucose 70 - 99 mg/dL 144(H) 143(H) 119(H)  BUN 6 - 23 mg/dL $Remove'21 20 18  'yGgMsWi$ Creatinine 0.40 - 1.20 mg/dL 0.78 0.78 0.76  Sodium 135 - 145 mEq/L 138 140 136  Potassium 3.5 - 5.1 mEq/L 4.7 4.9 4.5  Chloride 96 - 112 mEq/L 104 105 102  CO2 19 - 32 mEq/L $Remove'26 26 27  'OjoXYXf$ Calcium 8.4 - 10.5 mg/dL 10.0 10.3 10.0   Recent Relevant Labs: Lab Results  Component Value Date/Time   HGBA1C 7.4 (H) 12/26/2020 07:48 AM   HGBA1C 7.7 (H) 08/28/2020 08:09 AM   MICROALBUR 1.0  12/26/2020 07:48 AM   MICROALBUR 10.0 (H) 04/24/2020 08:59 AM    Kidney Function Lab Results  Component Value Date/Time   CREATININE 0.78 12/26/2020 07:48 AM   CREATININE 0.78 08/28/2020 08:09 AM   GFR 77.72 12/26/2020 07:48 AM   GFRNONAA 76 (L) 02/01/2012 06:30 AM   GFRAA 88 (L) 02/01/2012 06:30 AM    Current antihyperglycemic regimen:  Invokana $RemoveB'300mg'fiHgBedS$ , 1 tablet once daily Tresiba 42 units at bedtime Novolog Flexpen, 18 units with largest meal and 10 units at dinner What recent interventions/DTPs have been made to improve glycemic control:  Patient was changes from Lantus to [place] at night Have there been any recent hospitalizations or ED visits since last visit with CPP? None Patient denies hypoglycemic  symptoms, including Pale, Sweaty, Shaky, Hungry, Nervous/irritable, and Vision changes Patient denies hyperglycemic symptoms, including blurry vision, excessive thirst, fatigue, polyuria, and weakness How often are you checking your blood sugar? 3-4 times daily What are your blood sugars ranging?  Fasting: 130 Before meals:120 After meals: 120 Patient reports she alternates the times she checks every other day but the above are her average readings During the week, how often does your blood glucose drop below 70? Never Patients meals include beans of different type fish and chicken.   Adherence Review: Is the patient currently on a STATIN medication? Yes Is the patient currently on ACE/ARB medication? Yes Does the patient have >5 day gap between last estimated fill dates? No  Notes: Patient reports she is not on patient assistance for any of her medications, never went with the [place] Patient assistance application when she received it, she is not interested in patient assistance for any of her medications, nor a follow up appointment with the Clinical Pharmacist at this time, states she will call and schedule an appointment when she is ready, she is ok with me checking back in with her in a few months.  Care Gaps: Zoster vaccine - Overdue Colonoscopy - Overdue TDAP - Overdue Covid Booster #2 Veterinary surgeon) - Overdue Flu vaccine - Overdue AWV- 2021 office aware to schedule BP- 138/60 A1C - 7.4 CCM- declined  Star Rating Drugs: Atorvastatin (Lipitor) 80 mg - Last filled 02/13/2021 [facility] Lisinopril (Zestril) 20 mg - Last filled 12/29/2020 [facility] Canagliflozin (Invokana) 300 mg - Last filled [facility]/25/2021 [facility] (Call to [facility] and verified fill history date for Lipitor)  [place] Pharmacist Assistant [phone]

## 2021-03-26 ENCOUNTER — Ambulatory Visit: Payer: Medicare HMO

## 2021-04-03 ENCOUNTER — Telehealth: Payer: Self-pay | Admitting: Internal Medicine

## 2021-04-03 NOTE — Telephone Encounter (Signed)
Spoke with patient to schedule AWV Patient declined stating she had one 2 years ago and insurance did not pay.  Wanting a call after 1st of year

## 2021-04-22 ENCOUNTER — Ambulatory Visit: Payer: Medicare HMO | Admitting: Podiatry

## 2021-04-22 ENCOUNTER — Other Ambulatory Visit: Payer: Self-pay

## 2021-04-22 ENCOUNTER — Encounter: Payer: Self-pay | Admitting: Podiatry

## 2021-04-22 DIAGNOSIS — L600 Ingrowing nail: Secondary | ICD-10-CM

## 2021-04-22 NOTE — Patient Instructions (Signed)

## 2021-04-22 NOTE — Progress Notes (Signed)
  Subjective:  Patient ID: Kimberly [name], female    DOB: [birth year],  MRN: [id number]  Chief Complaint  Patient presents with   Nail Problem       ingrown toenail left big toe (pt is diabetic)    70 y.o. female presents with the above complaint. History confirmed with patient.  Toe became painful and swollen and red a couple weeks ago hard to touch or put shoes on.  Her A1c is good and 7.2%  Objective:  Physical Exam: warm, good capillary refill, no trophic changes or ulcerative lesions, normal DP and PT pulses, and normal sensory exam. Left Foot: Ingrown lateral border hallux no paronychia or purulence or infection Assessment:   1. Ingrowing left great toenail      Plan:  Patient was evaluated and treated and all questions answered.    Ingrown Nail, left -Patient elects to proceed with minor surgery to remove ingrown toenail today. Consent reviewed and signed by patient. -Ingrown nail excised. See procedure note. -Educated on post-procedure care including soaking. Written instructions provided and reviewed. -Advised if it recurs would do matricectomy we will hold off on this for now consider smoking status and diabetes may put her at risk  Procedure: Excision of Ingrown Toenail Location: Left 1st toe lateral nail borders. Anesthesia: Lidocaine 1% plain; 1.5 mL and Marcaine 0.5% plain; 1.5 mL, digital block. Skin Prep: Betadine. Dressing: Silvadene; telfa; dry, sterile, compression dressing. Technique: Following skin prep, the toe was exsanguinated and a tourniquet was secured at the base of the toe. The affected nail border was freed, split with a nail splitter, and excised.  The tourniquet was then removed and sterile dressing applied. Disposition: Patient tolerated procedure well.   Return if symptoms worsen or fail to improve.

## 2021-05-02 ENCOUNTER — Other Ambulatory Visit: Payer: Self-pay

## 2021-05-02 ENCOUNTER — Other Ambulatory Visit (INDEPENDENT_AMBULATORY_CARE_PROVIDER_SITE_OTHER): Payer: Medicare HMO

## 2021-05-02 DIAGNOSIS — Z794 Long term (current) use of insulin: Secondary | ICD-10-CM

## 2021-05-02 DIAGNOSIS — E1165 Type 2 diabetes mellitus with hyperglycemia: Secondary | ICD-10-CM

## 2021-05-02 DIAGNOSIS — E782 Mixed hyperlipidemia: Secondary | ICD-10-CM

## 2021-05-02 LAB — BASIC METABOLIC PANEL
BUN: 23 mg/dL (ref 6–23)
CO2: 28 mEq/L (ref 19–32)
Calcium: 10 mg/dL (ref 8.4–10.5)
Chloride: 107 mEq/L (ref 96–112)
Creatinine, Ser: 0.92 mg/dL (ref 0.40–1.20)
GFR: 63.6 mL/min (ref >60.00)
Glucose, Bld: 139 mg/dL — ABNORMAL HIGH (ref 70–99)
Potassium: 4.9 mEq/L (ref 3.5–5.1)
Sodium: 140 mEq/L (ref 135–145)

## 2021-05-02 LAB — LIPID PANEL
Cholesterol: 173 mg/dL (ref 0–200)
HDL: 30.9 mg/dL — ABNORMAL LOW (ref >39.00)
LDL Cholesterol: 105 mg/dL — ABNORMAL HIGH (ref 0–99)
NonHDL: 142.35
Total CHOL/HDL Ratio: 6
Triglycerides: 185 mg/dL — ABNORMAL HIGH (ref 0.0–149.0)
VLDL: 37 mg/dL (ref 0.0–40.0)

## 2021-05-02 LAB — HEMOGLOBIN A1C: Hgb A1c MFr Bld: 8.2 % — ABNORMAL HIGH (ref 4.6–6.5)

## 2021-05-03 ENCOUNTER — Other Ambulatory Visit: Payer: Self-pay | Admitting: Endocrinology

## 2021-05-06 ENCOUNTER — Ambulatory Visit: Payer: Medicare HMO | Admitting: Podiatry

## 2021-05-06 ENCOUNTER — Other Ambulatory Visit: Payer: Self-pay

## 2021-05-06 DIAGNOSIS — L600 Ingrowing nail: Secondary | ICD-10-CM | POA: Diagnosis not present

## 2021-05-06 NOTE — Progress Notes (Signed)
  Subjective:  Patient ID: Kimberly [name], female    DOB: 1951/02/05,  MRN: [id number]  Chief Complaint  Patient presents with   Ingrown Toenail    2 week follow up left     70 y.o. female presents with the above complaint. History confirmed with patient.  Doing much better she is having very little pain  Objective:  Physical Exam: warm, good capillary refill, no trophic changes or ulcerative lesions, normal DP and PT pulses, and normal sensory exam. Left Foot: Nail avulsion site is healing well a Assessment:   1. Ingrowing left great toenail      Plan:  Patient was evaluated and treated and all questions answered.    Ingrown Nail, left -Overall doing very well she can leave the toe open to air and discontinue soaks and ointment at this point.  Advised that the toenail will regrow but if it becomes bothersome again she will return to see me for a partial permanent avulsion  Return if symptoms worsen or fail to improve.

## 2021-05-07 ENCOUNTER — Encounter: Payer: Self-pay | Admitting: Endocrinology

## 2021-05-07 ENCOUNTER — Telehealth: Payer: Self-pay | Admitting: Endocrinology

## 2021-05-07 ENCOUNTER — Ambulatory Visit (INDEPENDENT_AMBULATORY_CARE_PROVIDER_SITE_OTHER): Payer: Medicare HMO | Admitting: Endocrinology

## 2021-05-07 VITALS — BP 142/78 | HR 70 | Ht 68.0 in | Wt 166.8 lb

## 2021-05-07 DIAGNOSIS — E1165 Type 2 diabetes mellitus with hyperglycemia: Secondary | ICD-10-CM

## 2021-05-07 DIAGNOSIS — E782 Mixed hyperlipidemia: Secondary | ICD-10-CM

## 2021-05-07 DIAGNOSIS — Z794 Long term (current) use of insulin: Secondary | ICD-10-CM

## 2021-05-07 DIAGNOSIS — E063 Autoimmune thyroiditis: Secondary | ICD-10-CM

## 2021-05-07 DIAGNOSIS — I10 Essential (primary) hypertension: Secondary | ICD-10-CM | POA: Diagnosis not present

## 2021-05-07 NOTE — Patient Instructions (Addendum)
71 Tresiba If am sugar >130 then go to 46  Check blood sugars on waking up 3 days a week  Also check blood sugars about 2 hours after meals and do this after different meals by rotation  Recommended blood sugar levels on waking up are 90-130 and about 2 hours after meal is 130-160  Please bring your blood sugar monitor to each visit, thank you  INDOOR EXERCISE IDEAS   Use the following examples for a creative indoor workout (perform each move for 2-3 minutes):  Warm up. Put on some music that makes you feel like moving, and dance around the living room. Watch exercise shows on TV and move along with them. There are tons of free cable channels that have daily exercise shows on them for all levels - beginner through advanced.  You can easily find a number of exercise videos but use one that will suit your liking and exercise level; you can do these on your own schedule. Walk up and down the steps. Do dumbbell curls and presses (if you don't have weights, use full water bottles). Do assisted squats, keeping your back on a fitness ball against the [place] or using the back of the couch for support. Shadow box: Lift and lower the left leg; jab with the right arm, then the left; then lift and lower the right leg. Fence (you don't even need swords). Pretend you're holding a sword in each hand. Create an X pattern standing still, then moving forward and back. Hop on your exercise bike or treadmill -- or, for something different, use a weighted hula hoop. If you don't have any of those, just go back to dancing. Do abdominal crunches (hold a weighted ball for added resistance). Cool down with [employer] "I Feel Good" -- or whatever tune makes you feel good

## 2021-05-07 NOTE — Telephone Encounter (Signed)
Please call: Forgot to tell her about her cholesterol panel.  Her triglycerides are improved but higher cholesterol is much higher.  Needs to go back to taking ezetimibe along with her atorvastatin and fenofibrate

## 2021-05-07 NOTE — Progress Notes (Signed)
Patient ID: Kimberly [name], female   DOB: January 13, 1951, 70 y.o.   MRN: [id number]           Reason for Appointment: Endocrinology follow-up    History of Present Illness:          Date of diagnosis of type 2 diabetes mellitus: 1995        Background history:   For several years she had been tried on oral agents with variable control.  Detailed history not available She thinks that she had nausea and vomiting with metformin and could not take much Also had been tried on Amaryl  She thinks she had swelling with Januvia and not clear what other medications she has tried before  She was started on insulin in 07/2013 when her A1c was over 11% Because of her poor control and A1c of 9.1 she was started on Invokana 100 mg daily in 12/16  Recent history:   INSULIN regimen is: Tresiba U-200, 42 hs   Novolog 18 units before lunch. Novolog 10 acs    Non-insulin hypoglycemic drugs the patient is taking are:  Invokana 300 mg daily  Her A1c is slightly better at 7.4, previously was the same at 7.7  A1c previously has been as low as 6.7   Current blood sugar patterns, management and problems identified: She did bring her monitor for download but this could not be downloaded today Mostly checking blood sugars before dinner instead of after She does have overall somewhat higher readings at all times including fasting  She is not sure why her blood are high compared to last visit and postprandial readings do not appear to be higher when checked She may only occasionally go off her diet with things like candy However she is not doing any physical activity and previously was trying to do a little walking indoors She has taken [place] consistently at the same time Has not had any difficulties taking Invokana regularly every morning Weight has gone up 4 pounds       Side effects from medications have been: Nausea from metformin,?  Swelling from Januvia  Glucose monitoring:  done 1-2 times a day          Glucometer:  Accu-Chek       Blood Glucose readings from monitor:   PRE-MEAL Fasting Lunch Dinner Bedtime Overall  Glucose range: 122-148  122-127    Mean/median:     142   POST-MEAL PC Breakfast PC Lunch PC Dinner  Glucose range:  117-162 118?  Mean/median:      Previously  PRE-MEAL Fasting Lunch Dinner Bedtime Overall  Glucose range:  65   65-232  Mean/median: 119    127   POST-MEAL PC Breakfast PC Lunch PC Dinner  Glucose range:     Mean/median:  127 139       Self-care: The diet that the patient has been following is: tries to limit fats, breads.     Meal times are:  Lunch: 12 pm Dinner: 5-7 PM   Typical meal intake: Breakfast is minimal          Dietician visit, most recent: 09/2015                Weight history:  Wt Readings from Last 3 Encounters:  05/07/21 166 lb 12.8 oz (75.7 kg)  02/26/21 162 lb (73.5 kg)  01/04/21 165 lb (74.8 kg)    Glycemic control:   Lab Results  Component Value Date   HGBA1C 8.2 (  H) 05/02/2021   HGBA1C 7.4 (H) 12/26/2020   HGBA1C 7.7 (H) 08/28/2020   Lab Results  Component Value Date   MICROALBUR 1.0 12/26/2020   LDLCALC 105 (H) 05/02/2021   CREATININE 0.92 05/02/2021    Other active problems discussed today: See review of systems   Allergies as of 05/07/2021       Reactions   Metformin And Related Nausea And Vomiting   After one pill at hospital   Januvia [sitagliptin Phosphate] Other (See Comments)   Swelling and edema see 4 13 OV        Medication List        Accurate as of May 07, 2021 11:47 AM. If you have any questions, ask your nurse or doctor.          Accu-Chek FastClix Lancets Misc Use as instructed to check blood sugar 4 times daily.   Accu-Chek Guide test strip Generic drug: glucose blood USE ACCU CHECK GUIDE TEST STRIPS AS INSTRUCTED TO CHECK BLOOD SUGAR 4 TIMES DAILY.   Accu-Chek Guide w/Device Kit 1 each by Does not apply route 4 (four) times daily. Use Accu Chek to check  blood sugar 4 times daily.   albuterol 108 (90 Base) MCG/ACT inhaler Commonly known as: VENTOLIN HFA INHALE 2 PUFFS INTO THE LUNGS EVERY 6 HOURS AS NEEDED FOR SHORTNESS OF BREATH OR WHEEZING   aspirin 325 MG EC tablet Take 325 mg by mouth every morning.   atorvastatin 80 MG tablet Commonly known as: LIPITOR TAKE 1 TABLET BY MOUTH EVERY DAY   BD Pen Needle Nano U/F 32G X 4 MM Misc Generic drug: Insulin Pen Needle Use as instructed to inject 3 insulin 3 times daily.   ezetimibe 10 MG tablet Commonly known as: ZETIA TAKE 1 TABLET BY MOUTH EVERY DAY   fenofibrate 145 MG tablet Commonly known as: TRICOR Take 1 tablet (145 mg total) by mouth daily. Stop the zetia   Invokana 300 MG Tabs tablet Generic drug: canagliflozin TAKE 1 TABLET BY MOUTH EVERY DAY BEFORE BREAKFAST   levothyroxine 50 MCG tablet Commonly known as: SYNTHROID TAKE 1 TABLET BY MOUTH EVERY DAY   lisinopril 20 MG tablet Commonly known as: ZESTRIL TAKE 1 TABLET BY MOUTH EVERY DAY   NovoLOG FlexPen 100 UNIT/ML FlexPen Generic drug: insulin aspart INJECT 18 UNITS UNDER THE SKIN AT LARGEST MEAL AND 10 UNITS AT Wonda Cheng   Tyler Aas FlexTouch 200 UNIT/ML FlexTouch Pen Generic drug: insulin degludec INJECT 42 UNITS INTO THE SKIN DAILY.        Allergies:  Allergies  Allergen Reactions   Metformin And Related Nausea And Vomiting    After one pill at hospital   Januvia [Sitagliptin Phosphate] Other (See Comments)    Swelling and edema see 4 13 OV    Past Medical History:  Diagnosis Date   CAD (coronary artery disease)    Colitis    Colitis, indeterminate 01/31/2012   COPD (chronic obstructive pulmonary disease) ([place])    Depression 05/16/2017   Depression with anxiety 05/30/2016   Diverticulitis 02/10/2012   DM (diabetes mellitus) ([place])    Glaucoma    Glaucoma    History of ventricular fibrillation    HTN (hypertension)    Myocardial infarction ([place])    12/1998, 01/2011   Osteoporosis    PVD  (peripheral vascular disease) ([place])     Past Surgical History:  Procedure Laterality Date   ABDOMINAL HYSTERECTOMY     endometriosis   CABG x 3  01/22/2011  Dr Cyndia Bent   CARDIAC CATHETERIZATION  01/17/2011   Dr Arvella Merles   LIPOMA EXCISION Left 02/08/2015   Procedure: [place];  Surgeon: Greer Pickerel, MD;  Location: [facility];  Service: General;  Laterality: Left;   S/P aortobifemoral bypass  2008   Dr Donnetta Hutching   S/P myocardial infarction and PCI of the lt circumflex  2000    Family History  Problem Relation Age of Onset   Heart attack Father    Diabetes Father    Heart disease Father    Diabetes Brother    Diabetes Mother    Liver cancer Mother    Hypertension Other    Thyroid disease Other        sisters- sister had parathyroidectomy   Alcohol abuse Other        brother   Colon polyps Maternal Uncle    Breast cancer Sister    Diabetes Sister    Thyroid disease Sister     Social History:  reports that she has been smoking cigarettes. She has been smoking an average of 1 pack per day. She has never used smokeless tobacco. She reports that she does not drink alcohol and does not use drugs.    Review of Systems    Lipid history: On 80 mg Lipitor  with history of CAD Prescribed by PCP  Her LDL is now much higher although her triglycerides are slightly better with adding fenofibrate   Lab Results  Component Value Date   CHOL 173 05/02/2021   CHOL 123 02/26/2021   CHOL 126 12/26/2020   Lab Results  Component Value Date   HDL 30.90 (L) 05/02/2021   HDL 25.20 (L) 02/26/2021   HDL 26.50 (L) 12/26/2020   Lab Results  Component Value Date   LDLCALC 105 (H) 05/02/2021   LDLCALC 51 01/31/2020   LDLCALC 50 02/03/2019   Lab Results  Component Value Date   TRIG 185.0 (H) 05/02/2021   TRIG 242.0 (H) 02/26/2021   TRIG 229.0 (H) 12/26/2020   Lab Results  Component Value Date   CHOLHDL 6 05/02/2021   CHOLHDL 5 02/26/2021   CHOLHDL 5  12/26/2020   Lab Results  Component Value Date   LDLDIRECT 66.0 02/26/2021   LDLDIRECT 65.0 12/26/2020   LDLDIRECT 54.0 11/24/2018            Hypertension: Present for several years  Blood pressure not checked at home  She is taking LISINOPRIL 20 mg daily  BP Readings from Last 3 Encounters:  05/07/21 (!) 142/78  02/26/21 138/60  01/04/21 124/60    HYPERCALCEMIA: She has had periodic mild hypercalcemia since about 2010; PTH levels in the mid-upper range Calcium is upper normal and stable  Lab Results  Component Value Date   CALCIUM 10.0 05/02/2021    She is taking 1000 units of vitamin D with adequate vitamin D levels  OSTEOPENIA:  No history of fractures  Her last bone density was in 4/22  Results:   Lumbar spine L1-L4(L3) Femoral neck (FN)  T-score 0.2 RFN: -1.0 LFN: -1.3  Change in BMD from previous DXA test (%) Down 0.6% Down 2.0%    She was tried on Evista but she says it was causing her hot flashes and night sweats and she stopped taking this  Previously in 07/2015 T score -2.3 was at the wrist, this was not measured in 2019  Most recent eye exam was normal in 5/21 and she does go every year  Most recent foot exam: 07/2018 by PCP  HYPOTHYROIDISM:  She has a family history of hypothyroidism, Graves' disease and thyroid nodules  On her visit in 11/20 she was complaining of feeling more tired as well as colder; had some weight gain also.   Baseline TSH was 6  With levothyroxine 25 mcg daily she did not feel any better and with TSH still 4.5 she was told to go up to 50 mcg in 07/2019 With a higher dose she has less fatigue  She has been regular with her dosage every morning  Her TSH is consistently normal  Lab Results  Component Value Date   TSH 2.68 12/26/2020   TSH 3.62 08/28/2020   TSH 3.28 01/31/2020   FREET4 0.97 12/26/2020   FREET4 1.11 01/31/2020   FREET4 1.23 10/24/2019     Review of Systems    LABS:  Lab on 05/02/2021   Component Date Value Ref Range Status   Cholesterol 05/02/2021 173  0 - 200 mg/dL Final   ATP III Classification       Desirable:  < 200 mg/dL               Borderline High:  200 - 239 mg/dL          High:  > = 240 mg/dL   Triglycerides 05/02/2021 185.0 (H)  0.0 - 149.0 mg/dL Final   Normal:  <150 mg/dLBorderline High:  150 - 199 mg/dL   HDL 05/02/2021 30.90 (L)  >39.00 mg/dL Final   VLDL 05/02/2021 37.0  0.0 - 40.0 mg/dL Final   LDL Cholesterol 05/02/2021 105 (H)  0 - 99 mg/dL Final   Total CHOL/HDL Ratio 05/02/2021 6   Final                  Men          Women1/2 Average Risk     3.4          3.3Average Risk          5.0          4.42X Average Risk          9.6          7.13X Average Risk          15.0          11.0                       NonHDL 05/02/2021 142.35   Final   NOTE:  Non-HDL goal should be 30 mg/dL higher than patient's LDL goal (i.e. LDL goal of < 70 mg/dL, would have non-HDL goal of < 100 mg/dL)   Sodium 05/02/2021 140  135 - 145 mEq/L Final   Potassium 05/02/2021 4.9  3.5 - 5.1 mEq/L Final   Chloride 05/02/2021 107  96 - 112 mEq/L Final   CO2 05/02/2021 28  19 - 32 mEq/L Final   Glucose, Bld 05/02/2021 139 (H)  70 - 99 mg/dL Final   BUN 05/02/2021 23  6 - 23 mg/dL Final   Creatinine, Ser 05/02/2021 0.92  0.40 - 1.20 mg/dL Final   GFR 05/02/2021 63.60  >60.00 mL/min Final   Calculated using the CKD-EPI Creatinine Equation (2021)   Calcium 05/02/2021 10.0  8.4 - 10.5 mg/dL Final   Hgb A1c MFr Bld 05/02/2021 8.2 (H)  4.6 - 6.5 % Final   Glycemic Control Guidelines for People with Diabetes:Non Diabetic:  <6%Goal of Therapy: <7%Additional  Action Suggested:  >8%     Physical Examination:  BP (!) 142/78   Pulse 70   Ht $R'5\' 8"'jn$  (1.727 m)   Wt 166 lb 12.8 oz (75.7 kg)   SpO2 98%   BMI 25.36 kg/m   Diabetic Foot Exam - Simple   Simple Foot Form Diabetic Foot exam was performed with the following findings: Yes   Visual Inspection No deformities, no ulcerations, no other  skin breakdown bilaterally: Yes Sensation Testing Intact to touch and monofilament testing bilaterally: Yes Pulse Check Posterior Tibialis and Dorsalis pulse intact bilaterally: Yes See comments: Yes Comments Pedal pulses absent on the left      ASSESSMENT:  Diabetes type 2, mildly obese  See history of present illness for detailed discussion of his current management, blood sugar patterns and problems identified  A1c is higher than usual at 8.2  She is on basal bolus insulin with Invokana 300 mg  She has relatively higher blood sugars at home but not clear why her A1c is significantly higher  There is been no change in her medication compliance May not be as consistent on exercise and diet and has gained some weight  MICROALBUMINURIA: This is controlled with using 20 mg lisinopril, also has normal renal function  HYPOTHYROIDISM: Mild TSH levels have been normal with 50 mcg levothyroxine   HYPERLIPIDEMIA: Managed by PCP, on Lipitor and Zetia but not clear if she is compliant with her medications as LDL has gone up Triglycerides are better with adding fenofibrate  PLAN:    She will start checking her blood sugars more consistently after supper She will go up at least 2 units on her Tyler Aas and now take 44 units Again if her morning sugars are consistently high she will go up to 46. She will adjust her mealtime dose based on portion size and carbohydrate Also can reminded her to take her mealtime dose consistently before eating  Given her different options for home exercise regimen and encourage her to buy a exercise bike  Consider freestyle Elenor Legato but currently she is reluctant to do this Explained to her that if she is eligible for the [facility] and the reader she can benefit from better blood sugar control She was given the contact information on the DME supplier and patient brochure on the freestyle libre 2  Follow-up in 3 months for repeat A1c  Hyperlipidemia: Needs to  make sure she is taking Zetia    Patient Instructions  38 Tresiba If am sugar >130 then go to 46  Check blood sugars on waking up 3 days a week  Also check blood sugars about 2 hours after meals and do this after different meals by rotation  Recommended blood sugar levels on waking up are 90-130 and about 2 hours after meal is 130-160  Please bring your blood sugar monitor to each visit, thank you  INDOOR EXERCISE IDEAS   Use the following examples for a creative indoor workout (perform each move for 2-3 minutes):  Warm up. Put on some music that makes you feel like moving, and dance around the living room. Watch exercise shows on TV and move along with them. There are tons of free cable channels that have daily exercise shows on them for all levels - beginner through advanced.  You can easily find a number of exercise videos but use one that will suit your liking and exercise level; you can do these on your own schedule. Walk up and down the steps. Do  dumbbell curls and presses (if you don't have weights, use full water bottles). Do assisted squats, keeping your back on a fitness ball against the [place] or using the back of the couch for support. Shadow box: Lift and lower the left leg; jab with the right arm, then the left; then lift and lower the right leg. Fence (you don't even need swords). Pretend you're holding a sword in each hand. Create an X pattern standing still, then moving forward and back. Hop on your exercise bike or treadmill -- or, for something different, use a weighted hula hoop. If you don't have any of those, just go back to dancing. Do abdominal crunches (hold a weighted ball for added resistance). Cool down with [employer] "I Feel Good" -- or whatever tune makes you feel good  [name] 05/07/2021, 11:47 AM   Note: This office note was prepared with Dragon voice recognition system technology. Any transcriptional errors that result from this process are  unintentional.

## 2021-05-08 NOTE — Telephone Encounter (Signed)
Attempted to contact the patient, unable to leave a voicemail. Will attempt to call again later.

## 2021-05-08 NOTE — Telephone Encounter (Signed)
Please see note to call patient about cholesterol

## 2021-07-19 ENCOUNTER — Other Ambulatory Visit: Payer: Self-pay | Admitting: Endocrinology

## 2021-07-30 ENCOUNTER — Telehealth: Payer: Self-pay

## 2021-07-30 ENCOUNTER — Telehealth: Payer: Self-pay | Admitting: Internal Medicine

## 2021-07-30 ENCOUNTER — Other Ambulatory Visit: Payer: Self-pay | Admitting: Endocrinology

## 2021-07-30 DIAGNOSIS — Z794 Long term (current) use of insulin: Secondary | ICD-10-CM

## 2021-07-30 DIAGNOSIS — E1165 Type 2 diabetes mellitus with hyperglycemia: Secondary | ICD-10-CM

## 2021-07-30 NOTE — Telephone Encounter (Signed)
Spoke with patient to schedule Medicare Annual Wellness Visit (AWV) either virtually or in office.   Patient declined stating she does not do these any more.  She stated she did one and insurance did not pay   Last AWV ;03/20/20 please schedule at anytime with [facility] Nurse Health Advisor 1 or 2   This should be a 45 minute visit.

## 2021-07-30 NOTE — Telephone Encounter (Signed)
Pt called in requesting referral to dietician

## 2021-08-01 ENCOUNTER — Encounter: Payer: Medicare HMO | Attending: Endocrinology | Admitting: Dietician

## 2021-08-01 ENCOUNTER — Other Ambulatory Visit: Payer: Self-pay

## 2021-08-01 ENCOUNTER — Encounter: Payer: Self-pay | Admitting: Dietician

## 2021-08-01 DIAGNOSIS — Z713 Dietary counseling and surveillance: Secondary | ICD-10-CM | POA: Diagnosis not present

## 2021-08-01 DIAGNOSIS — Z794 Long term (current) use of insulin: Secondary | ICD-10-CM | POA: Diagnosis not present

## 2021-08-01 DIAGNOSIS — E1165 Type 2 diabetes mellitus with hyperglycemia: Secondary | ICD-10-CM | POA: Insufficient documentation

## 2021-08-01 DIAGNOSIS — E1151 Type 2 diabetes mellitus with diabetic peripheral angiopathy without gangrene: Secondary | ICD-10-CM

## 2021-08-01 NOTE — Progress Notes (Signed)
Diabetes Self-Management Education  Visit Type: First/Initial  Appt. Start Time: 1445 Appt. End Time: 5027  08/01/2021  [name], identified by name and date of birth, is a 71 y.o. female with a diagnosis of Diabetes: Type 2.   ASSESSMENT Patient is here today alone. "Not ready to quit smoking due to depression and family things going on." Patient of Dr. Dwyane Dee.  She would like to learn what she can and cannot eat "breakfast, snacks, sweets". Checks blood glucose 1-2 times daily. She is going to contact a DME company to obtain the [employer]. She recently started walking and doing a sit and be fit exercise program most days since having A1C.  History includes Type 2 diabetes, HTN, HLD, COPD, osteoporosis, MI, anxiety, hypothyroidism, glaucoma, PVD, smokes (1 pack daily) A1C 8.2% 05/02/2021 increased from 7.4% 12/26/2020.  Cholesterol 173, HDL 30 LDL 105, Triglycerides 185, BUN 23, Creatinine 0.92, Potassium 4.9 on 05/02/2021, vitamin D 44 on 08/28/2020 Medications include Invokana, Tresiba 44 units q HS, Novolog 18 units before lunch and 10 units before dinner. Allergies:  Metformin, Januvia  Patient lives alone.  She was a housewife. Height 5\' 4"  (1.626 m), weight 167 lb (75.8 kg). Body mass index is 28.67 kg/m.   Diabetes Self-Management Education - 08/01/21 1455       Visit Information   Visit Type First/Initial      Initial Visit   Diabetes Type Type 2    Are you currently following a meal plan? No    Are you taking your medications as prescribed? Yes    Date Diagnosed [place]   How would you rate your overall health? Fair      Psychosocial Assessment   Patient Belief/Attitude about Diabetes Motivated to manage diabetes    Self-care barriers None    Self-management support Doctor's office    Other persons present Patient    Patient Concerns Nutrition/Meal planning;Glycemic Control    Special Needs None    Learning Readiness Ready     How often do you need to have someone help you when you read instructions, pamphlets, or other written materials from your doctor or pharmacy? 1 - Never    What is the last grade level you completed in school? 7      Pre-Education Assessment   Patient understands the diabetes disease and treatment process. Needs Instruction    Patient understands incorporating nutritional management into lifestyle. Needs Instruction    Patient undertands incorporating physical activity into lifestyle. Needs Instruction    Patient understands using medications safely. Needs Instruction    Patient understands monitoring blood glucose, interpreting and using results Needs Instruction    Patient understands prevention, detection, and treatment of acute complications. Needs Instruction    Patient understands prevention, detection, and treatment of chronic complications. Needs Instruction    Patient understands how to develop strategies to address psychosocial issues. Needs Instruction    Patient understands how to develop strategies to promote health/change behavior. Needs Instruction      Complications   Last HgB A1C per patient/outside source 8.2 %   05/02/2021 increased from 7.4% on 12/26/2020   How often do you check your blood sugar? 1-2 times/day    Fasting Blood glucose range (mg/dL) 130-179   140-150   Postprandial Blood glucose range (mg/dL) 130-179;180-200   163-200   Number of hypoglycemic episodes per month 1    Can you tell when your blood sugar is low?  Yes    What do you do if your blood sugar is low? OJ    Number of hyperglycemic episodes per week 0    Have you had a dilated eye exam in the past 12 months? Yes    Have you had a dental exam in the past 12 months? No   false teeth   Are you checking your feet? Yes    How many days per week are you checking your feet? 1      Dietary Intake   Breakfast slice of [place] or grilled chicken or [place] burger without a bun, occasional banana when glucose  is lower    Snack (morning) none    Lunch [place] or [place] burger or skinless chicken, tuna, legume/pea, greens or cabbage   11-11:30   Snack (afternoon) none    Dinner similar to lunch    Snack (evening) sugar free jello    Beverage(s) 4 glasses water, 2 cups coffee (black), unsweetened green tea      Exercise   Exercise Type Light (walking / raking leaves)   stretching.  Hip problems.  walking   How many days per week to you exercise? 6    How many minutes per day do you exercise? --    Total minutes per week of exercise --      Patient Education   Previous Diabetes Education No    Disease state  Definition of diabetes, type 1 and 2, and the diagnosis of diabetes    Nutrition management  Role of diet in the treatment of diabetes and the relationship between the three main macronutrients and blood glucose level;Meal options for control of blood glucose level and chronic complications.;Meal timing in regards to the patients' current diabetes medication.   breakfast ideas   Physical activity and exercise  Role of exercise on diabetes management, blood pressure control and cardiac health.    Medications Reviewed patients medication for diabetes, action, purpose, timing of dose and side effects.    Monitoring Daily foot exams;Identified appropriate SMBG and/or A1C goals.    Acute complications Taught treatment of hypoglycemia - the 15 rule.;Discussed and identified patients' treatment of hyperglycemia.    Chronic complications Relationship between chronic complications and blood glucose control    Psychosocial adjustment Role of stress on diabetes;Identified and addressed patients feelings and concerns about diabetes      Individualized Goals (developed by patient)   Nutrition General guidelines for healthy choices and portions discussed    Physical Activity Exercise 5-7 days per week;30 minutes per day    Medications take my medication as prescribed    Monitoring  test my blood glucose as  discussed    Reducing Risk increase portions of healthy fats;examine blood glucose patterns;do foot checks daily      Post-Education Assessment   Patient understands the diabetes disease and treatment process. Demonstrates understanding / competency    Patient understands incorporating nutritional management into lifestyle. Needs Review    Patient undertands incorporating physical activity into lifestyle. Demonstrates understanding / competency    Patient understands using medications safely. Demonstrates understanding / competency    Patient understands monitoring blood glucose, interpreting and using results Demonstrates understanding / competency    Patient understands prevention, detection, and treatment of acute complications. Demonstrates understanding / competency    Patient understands prevention, detection, and treatment of chronic complications. Demonstrates understanding / competency    Patient understands how to develop strategies to address psychosocial issues. Demonstrates understanding / competency  Patient understands how to develop strategies to promote health/change behavior. Needs Review      Outcomes   Expected Outcomes Demonstrated interest in learning. Expect positive outcomes    Future DMSE PRN    Program Status Completed             Individualized Plan for Diabetes Self-Management Training:   Learning Objective:  Patient will have a greater understanding of diabetes self-management. Patient education plan is to attend individual and/or group sessions per assessed needs and concerns.   Plan:   Patient Instructions  Treat options:  sugar free jello, sugar free popsicles, protein bar, 1-2 chocolate kisses  Consider walking for 10 minutes after each meal  Great job on being more active and being mindful of portions!  Recommend breakfast:  Limit to about 30 grams of carbs.   Low sugar Greek yogurt and fruit 1 slice [employer] toast and 1 egg and berries  1/2 cup  oatmeal, 3/4 cup berries, 1 small handful of nuts Breakfast burrito on low carb tortilla, fruit   Plan:  Aim for 2-3 Carb Choices per meal (30-45 grams) +/- 1 either way  Aim for 0-10 Carbs per snack if hungry  Include protein in moderation with your meals and snacks Consider reading food labels for Total Carbohydrate of foods Consider  increasing your activity level by walking for 30 minutes daily as tolerated Continue checking BG at alternate times per day  Continue taking medication as directed by MD    Expected Outcomes:  Demonstrated interest in learning. Expect positive outcomes  Education material provided: ADA - How to Thrive: A Guide for Your Journey with Diabetes, Food label handouts, Meal plan card, and Snack sheet, breakfast ideas  If problems or questions, patient to contact team via:  Phone  Future DSME appointment: PRN

## 2021-08-01 NOTE — Patient Instructions (Addendum)
Treat options:  sugar free jello, sugar free popsicles, protein bar, 1-2 chocolate kisses  Consider walking for 10 minutes after each meal  Great job on being more active and being mindful of portions!  Recommend breakfast:  Limit to about 30 grams of carbs.   Low sugar Greek yogurt and fruit 1 slice [employer] toast and 1 egg and berries  1/2 cup oatmeal, 3/4 cup berries, 1 small handful of nuts Breakfast burrito on low carb tortilla, fruit   Plan:  Aim for 2-3 Carb Choices per meal (30-45 grams) +/- 1 either way  Aim for 0-10 Carbs per snack if hungry  Include protein in moderation with your meals and snacks Consider reading food labels for Total Carbohydrate of foods Consider  increasing your activity level by walking for 30 minutes daily as tolerated Continue checking BG at alternate times per day  Continue taking medication as directed by MD

## 2021-08-13 ENCOUNTER — Other Ambulatory Visit: Payer: Self-pay

## 2021-08-13 ENCOUNTER — Other Ambulatory Visit: Payer: Self-pay | Admitting: Internal Medicine

## 2021-08-13 ENCOUNTER — Other Ambulatory Visit (INDEPENDENT_AMBULATORY_CARE_PROVIDER_SITE_OTHER): Payer: Medicare HMO

## 2021-08-13 DIAGNOSIS — E063 Autoimmune thyroiditis: Secondary | ICD-10-CM

## 2021-08-13 DIAGNOSIS — E1165 Type 2 diabetes mellitus with hyperglycemia: Secondary | ICD-10-CM | POA: Diagnosis not present

## 2021-08-13 DIAGNOSIS — E782 Mixed hyperlipidemia: Secondary | ICD-10-CM

## 2021-08-13 DIAGNOSIS — Z794 Long term (current) use of insulin: Secondary | ICD-10-CM | POA: Diagnosis not present

## 2021-08-13 LAB — TSH: TSH: 3.3 u[IU]/mL (ref 0.35–5.50)

## 2021-08-13 LAB — LIPID PANEL
Cholesterol: 132 mg/dL (ref 0–200)
HDL: 25.6 mg/dL — ABNORMAL LOW (ref >39.00)
NonHDL: 106.51
Total CHOL/HDL Ratio: 5
Triglycerides: 214 mg/dL — ABNORMAL HIGH (ref 0.0–149.0)
VLDL: 42.8 mg/dL — ABNORMAL HIGH (ref 0.0–40.0)

## 2021-08-13 LAB — COMPREHENSIVE METABOLIC PANEL
ALT: 27 U/L (ref 0–35)
AST: 23 U/L (ref 0–37)
Albumin: 4.4 g/dL (ref 3.5–5.2)
Alkaline Phosphatase: 61 U/L (ref 39–117)
BUN: 27 mg/dL — ABNORMAL HIGH (ref 6–23)
CO2: 26 mEq/L (ref 19–32)
Calcium: 10 mg/dL (ref 8.4–10.5)
Chloride: 101 mEq/L (ref 96–112)
Creatinine, Ser: 0.81 mg/dL (ref 0.40–1.20)
GFR: 73.95 mL/min (ref >60.00)
Glucose, Bld: 131 mg/dL — ABNORMAL HIGH (ref 70–99)
Potassium: 4.2 mEq/L (ref 3.5–5.1)
Sodium: 137 mEq/L (ref 135–145)
Total Bilirubin: 0.5 mg/dL (ref 0.2–1.2)
Total Protein: 7.2 g/dL (ref 6.0–8.3)

## 2021-08-13 LAB — LDL CHOLESTEROL, DIRECT: Direct LDL: 77 mg/dL

## 2021-08-13 LAB — HEMOGLOBIN A1C: Hgb A1c MFr Bld: 7.8 % — ABNORMAL HIGH (ref 4.6–6.5)

## 2021-08-15 ENCOUNTER — Ambulatory Visit (INDEPENDENT_AMBULATORY_CARE_PROVIDER_SITE_OTHER): Payer: Medicare HMO | Admitting: Endocrinology

## 2021-08-15 ENCOUNTER — Encounter: Payer: Self-pay | Admitting: Endocrinology

## 2021-08-15 ENCOUNTER — Other Ambulatory Visit: Payer: Self-pay

## 2021-08-15 VITALS — BP 140/70 | HR 80 | Ht 64.0 in | Wt 167.4 lb

## 2021-08-15 DIAGNOSIS — E1165 Type 2 diabetes mellitus with hyperglycemia: Secondary | ICD-10-CM

## 2021-08-15 DIAGNOSIS — I10 Essential (primary) hypertension: Secondary | ICD-10-CM

## 2021-08-15 DIAGNOSIS — E782 Mixed hyperlipidemia: Secondary | ICD-10-CM

## 2021-08-15 DIAGNOSIS — Z794 Long term (current) use of insulin: Secondary | ICD-10-CM | POA: Diagnosis not present

## 2021-08-15 DIAGNOSIS — J449 Chronic obstructive pulmonary disease, unspecified: Secondary | ICD-10-CM | POA: Diagnosis not present

## 2021-08-15 NOTE — Patient Instructions (Addendum)
Walking daily ? ?Take 65 Tresiba and may go to 48 if am still > 130 ? ?Take Invokana before 1st meal ?

## 2021-08-15 NOTE — Progress Notes (Signed)
Patient ID: Kimberly [name], female   DOB: 12-30-1950, 71 y.o.   MRN: [id number]           Reason for Appointment: Endocrinology follow-up    History of Present Illness:          Date of diagnosis of type 2 diabetes mellitus: 1995        Background history:   For several years she had been tried on oral agents with variable control.  Detailed history not available She thinks that she had nausea and vomiting with metformin and could not take much Also had been tried on Amaryl  She thinks she had swelling with Januvia and not clear what other medications she has tried before  She was started on insulin in 07/2013 when her A1c was over 11% Because of her poor control and A1c of 9.1 she was started on Invokana 100 mg daily in 12/16  Recent history:   INSULIN regimen is: Tresiba U-200, 44 hs   Novolog 18 units before lunch. Novolog 10 acs    Non-insulin hypoglycemic drugs the patient is taking are:  Invokana 300 mg daily  Her A1c is slightly better at 7.8  A1c previously has been as low as 6.7   Current blood sugar patterns, management and problems identified: She did bring her monitor for download   She checks her blood sugars in the mornings around 5-5:30 AM and her lab glucose was done at 8 AM which was about 131 Otherwise her FASTING blood sugars are mostly still high even compared to her last visit  Not clear why her A1c is still high since her postprandial readings are not consistently increased  She did not increase her Tyler Aas is based on her fasting reading as directed and is still taking 44  However she did feel her sugar dropping when she was late for her lunch meal around noon time  She takes her Invokana about 7 AM in the morning but does not eat anything until 11 AM at least  She has worked with the dietitian last month but her weight has leveled off and has not come down as yet She has taken [place] consistently at the same time She is only doing walking when the  weather is reasonably good and will walk up to 30 minutes       Side effects from medications have been: Nausea from metformin,?  Swelling from Januvia  Glucose monitoring:  done 1-2 times a day         Glucometer:  Accu-Chek       Blood Glucose readings from monitor:   PRE-MEAL Fasting Lunch Dinner Bedtime Overall  Glucose range: 143-160      Mean/median:     145   POST-MEAL PC Breakfast PC Lunch PC Dinner  Glucose range:  101-132 84-176  Mean/median:      Previously:  PRE-MEAL Fasting Lunch Dinner Bedtime Overall  Glucose range: 122-148  122-127    Mean/median:     142   POST-MEAL PC Breakfast PC Lunch PC Dinner  Glucose range:  117-162 118?  Mean/median:         Self-care: The diet that the patient has been following is: tries to limit fats, breads.     Meal times are:  Lunch: 12 pm Dinner: 5-7 PM   Typical meal intake: Breakfast is minimal          Dietician visit, most recent: 09/2015  Weight history:  Wt Readings from Last 3 Encounters:  08/15/21 167 lb 6.4 oz (75.9 kg)  08/01/21 167 lb (75.8 kg)  05/07/21 166 lb 12.8 oz (75.7 kg)    Glycemic control:   Lab Results  Component Value Date   HGBA1C 7.8 (H) 08/13/2021   HGBA1C 8.2 (H) 05/02/2021   HGBA1C 7.4 (H) 12/26/2020   Lab Results  Component Value Date   MICROALBUR 1.0 12/26/2020   LDLCALC 105 (H) 05/02/2021   CREATININE 0.81 08/13/2021    Other active problems discussed today: See review of systems   Allergies as of 08/15/2021       Reactions   Metformin And Related Nausea And Vomiting   After one pill at hospital   Januvia [sitagliptin Phosphate] Other (See Comments)   Swelling and edema see 4 13 OV        Medication List        Accurate as of August 15, 2021  9:23 AM. If you have any questions, ask your nurse or doctor.          Accu-Chek FastClix Lancets Misc Use as instructed to check blood sugar 4 times daily.   Accu-Chek Guide test strip Generic drug:  glucose blood USE ACCU CHECK GUIDE TEST STRIPS AS INSTRUCTED TO CHECK BLOOD SUGAR 4 TIMES DAILY.   Accu-Chek Guide w/Device Kit 1 each by Does not apply route 4 (four) times daily. Use Accu Chek to check blood sugar 4 times daily.   albuterol 108 (90 Base) MCG/ACT inhaler Commonly known as: VENTOLIN HFA INHALE 2 PUFFS INTO THE LUNGS EVERY 6 HOURS AS NEEDED FOR SHORTNESS OF BREATH OR WHEEZING   aspirin 325 MG EC tablet Take 325 mg by mouth every morning.   atorvastatin 80 MG tablet Commonly known as: LIPITOR TAKE 1 TABLET BY MOUTH EVERY DAY   BD Pen Needle Nano U/F 32G X 4 MM Misc Generic drug: Insulin Pen Needle Use as instructed to inject 3 insulin 3 times daily.   ezetimibe 10 MG tablet Commonly known as: ZETIA TAKE 1 TABLET BY MOUTH EVERY DAY   fenofibrate 145 MG tablet Commonly known as: TRICOR TAKE 1 TABLET (145 MG TOTAL) BY MOUTH DAILY. STOP THE ZETIA   Invokana 300 MG Tabs tablet Generic drug: canagliflozin TAKE 1 TABLET BY MOUTH EVERY DAY BEFORE BREAKFAST   levothyroxine 50 MCG tablet Commonly known as: SYNTHROID TAKE 1 TABLET BY MOUTH EVERY DAY   lisinopril 20 MG tablet Commonly known as: ZESTRIL TAKE 1 TABLET BY MOUTH EVERY DAY   NovoLOG FlexPen 100 UNIT/ML FlexPen Generic drug: insulin aspart INJECT 18 UNITS UNDER THE SKIN AT LARGEST MEAL AND 10 UNITS AT Wonda Cheng   Tyler Aas FlexTouch 200 UNIT/ML FlexTouch Pen Generic drug: insulin degludec INJECT 42 UNITS INTO THE SKIN DAILY.        Allergies:  Allergies  Allergen Reactions   Metformin And Related Nausea And Vomiting    After one pill at hospital   Januvia [Sitagliptin Phosphate] Other (See Comments)    Swelling and edema see 4 13 OV    Past Medical History:  Diagnosis Date   CAD (coronary artery disease)    Colitis    Colitis, indeterminate 01/31/2012   COPD (chronic obstructive pulmonary disease) ([place])    Depression 05/16/2017   Depression with anxiety 05/30/2016   Diverticulitis  02/10/2012   DM (diabetes mellitus) ([place])    Glaucoma    Glaucoma    History of ventricular fibrillation    HTN (hypertension)  Hyperlipidemia    Myocardial infarction ([place])    12/1998, 01/2011   Osteoporosis    PVD (peripheral vascular disease) [facility])     Past Surgical History:  Procedure Laterality Date   ABDOMINAL HYSTERECTOMY     endometriosis   CABG x 3  01/22/2011   Dr Cyndia Bent   CARDIAC CATHETERIZATION  01/17/2011   Dr Arvella Merles   LIPOMA EXCISION Left 02/08/2015   Procedure: EXCISION OF LEFT THIGH LIPOMA;  Surgeon: Greer Pickerel, MD;  Location: [place];  Service: General;  Laterality: Left;   S/P aortobifemoral bypass  2008   Dr Donnetta Hutching   S/P myocardial infarction and PCI of the lt circumflex  2000    Family History  Problem Relation Age of Onset   Heart attack Father    Diabetes Father    Heart disease Father    Diabetes Brother    Diabetes Mother    Liver cancer Mother    Hypertension Other    Thyroid disease Other        sisters- sister had parathyroidectomy   Alcohol abuse Other        brother   Colon polyps Maternal Uncle    Breast cancer Sister    Diabetes Sister    Thyroid disease Sister     Social History:  reports that she has been smoking cigarettes. She has been smoking an average of 1 pack per day. She has never used smokeless tobacco. She reports that she does not drink alcohol and does not use drugs.    Review of Systems    Lipid history: On 80 mg Lipitor  with history of CAD Prescribed by PCP Also on fenofibrate and Zetia LDL has come down with her going back on Zetia as directed  Lab Results  Component Value Date   CHOL 132 08/13/2021   CHOL 173 05/02/2021   CHOL 123 02/26/2021   Lab Results  Component Value Date   HDL 25.60 (L) 08/13/2021   HDL 30.90 (L) 05/02/2021   HDL 25.20 (L) 02/26/2021   Lab Results  Component Value Date   LDLCALC 105 (H) 05/02/2021   LDLCALC 51 01/31/2020   LDLCALC 50 02/03/2019   Lab Results   Component Value Date   TRIG 214.0 (H) 08/13/2021   TRIG 185.0 (H) 05/02/2021   TRIG 242.0 (H) 02/26/2021   Lab Results  Component Value Date   CHOLHDL 5 08/13/2021   CHOLHDL 6 05/02/2021   CHOLHDL 5 02/26/2021   Lab Results  Component Value Date   LDLDIRECT 77.0 08/13/2021   LDLDIRECT 66.0 02/26/2021   LDLDIRECT 65.0 12/26/2020            Hypertension: Present for several years  Blood pressure not checked at home  She is being treated with LISINOPRIL 20 mg daily  BP Readings from Last 3 Encounters:  08/15/21 140/70  05/07/21 (!) 142/78  02/26/21 138/60    HYPERCALCEMIA: She has had periodic mild hypercalcemia since about 2010; PTH levels in the mid-upper range Calcium is upper normal and stable  Lab Results  Component Value Date   CALCIUM 10.0 08/13/2021    She is taking 1000 units of vitamin D with adequate vitamin D levels  OSTEOPENIA:  No history of fractures  Her last bone density was in 4/22  Results:   Lumbar spine L1-L4(L3) Femoral neck (FN)  T-score 0.2 RFN: -1.0 LFN: -1.3  Change in BMD from previous DXA test (%) Down 0.6% Down 2.0%  She was tried on Evista but she says it was causing her hot flashes and night sweats and she stopped taking this  Previously in 07/2015 T score -2.3 was at the wrist, this was not measured in 2019  Most recent eye exam was normal in 10/2018 and she does go every year  Most recent foot exam: 11/22   HYPOTHYROIDISM:  She has a family history of hypothyroidism, Graves' disease and thyroid nodules  On her visit in 11/20 she was complaining of feeling more tired as well as colder; had some weight gain also.   Baseline TSH was 6  With levothyroxine 25 mcg daily she did not feel any better and with TSH still 4.5 she was told to go up to 50 mcg in 07/2019 With a higher dose she had less fatigue  She has been regular with her dosage every morning  Her TSH is consistently normal  Lab Results  Component Value  Date   TSH 3.30 08/13/2021   TSH 2.68 12/26/2020   TSH 3.62 08/28/2020   FREET4 0.97 12/26/2020   FREET4 1.11 01/31/2020   FREET4 1.23 10/24/2019     Review of Systems    LABS:  Lab on 08/13/2021  Component Date Value Ref Range Status   TSH 08/13/2021 3.30  0.35 - 5.50 uIU/mL Final   Cholesterol 08/13/2021 132  0 - 200 mg/dL Final   ATP III Classification       Desirable:  < 200 mg/dL               Borderline High:  200 - 239 mg/dL          High:  > = 240 mg/dL   Triglycerides 08/13/2021 214.0 (H)  0.0 - 149.0 mg/dL Final   Normal:  <150 mg/dLBorderline High:  150 - 199 mg/dL   HDL 08/13/2021 25.60 (L)  >39.00 mg/dL Final   VLDL 08/13/2021 42.8 (H)  0.0 - 40.0 mg/dL Final   Total CHOL/HDL Ratio 08/13/2021 5   Final                  Men          Women1/2 Average Risk     3.4          3.3Average Risk          5.0          4.42X Average Risk          9.6          7.13X Average Risk          15.0          11.0                       NonHDL 08/13/2021 106.51   Final   NOTE:  Non-HDL goal should be 30 mg/dL higher than patient's LDL goal (i.e. LDL goal of < 70 mg/dL, would have non-HDL goal of < 100 mg/dL)   Sodium 08/13/2021 137  135 - 145 mEq/L Final   Potassium 08/13/2021 4.2  3.5 - 5.1 mEq/L Final   Chloride 08/13/2021 101  96 - 112 mEq/L Final   CO2 08/13/2021 26  19 - 32 mEq/L Final   Glucose, Bld 08/13/2021 131 (H)  70 - 99 mg/dL Final   BUN 08/13/2021 27 (H)  6 - 23 mg/dL Final   Creatinine, Ser 08/13/2021 0.81  0.40 - 1.20 mg/dL Final   Total Bilirubin 08/13/2021  0.5  0.2 - 1.2 mg/dL Final   Alkaline Phosphatase 08/13/2021 61  39 - 117 U/L Final   AST 08/13/2021 23  0 - 37 U/L Final   ALT 08/13/2021 27  0 - 35 U/L Final   Total Protein 08/13/2021 7.2  6.0 - 8.3 g/dL Final   Albumin 08/13/2021 4.4  3.5 - 5.2 g/dL Final   GFR 08/13/2021 73.95  >60.00 mL/min Final   Calculated using the CKD-EPI Creatinine Equation (2021)   Calcium 08/13/2021 10.0  8.4 - 10.5 mg/dL Final    Hgb A1c MFr Bld 08/13/2021 7.8 (H)  4.6 - 6.5 % Final   Glycemic Control Guidelines for People with Diabetes:Non Diabetic:  <6%Goal of Therapy: <7%Additional Action Suggested:  >8%    Direct LDL 08/13/2021 77.0  mg/dL Final   Optimal:  <100 mg/dLNear or Above Optimal:  100-129 mg/dLBorderline High:  130-159 mg/dLHigh:  160-189 mg/dLVery High:  >190 mg/dL    Physical Examination:  BP 140/70    Pulse 80    Ht 5' 4" (1.626 m)    Wt 167 lb 6.4 oz (75.9 kg)    SpO2 97%    BMI 28.73 kg/m     ASSESSMENT:  Diabetes type 2, mildly obese  See history of present illness for detailed discussion of his current management, blood sugar patterns and problems identified  A1c is slightly better at 7.8 compared to 8.2  She is on basal bolus insulin with Invokana 300 mg  She has relatively higher blood sugars at home but not clear why her A1c is significantly higher  There is been no change in her medication compliance May not be as consistent on exercise and diet and has gained some weight  MICROALBUMINURIA: This is controlled with using 20 mg lisinopril, also has normal renal function  HYPOTHYROIDISM: Mild TSH levels have been normal with 50 mcg levothyroxine  Hypertension: Blood pressure is upper normal but she appears anxious with a relatively faster pulse rate also For now continue 20 mg lisinopril  HYPERLIPIDEMIA:  LDL is better but triglycerides still not improved, may improve with consistent diabetes control and some weight loss  PLAN:    She will start checking her blood sugars more consistently after supper She will go up at least 2 units on her Tyler Aas and now take 46 units, if her blood sugars are consistently over 130 she will go up to 48 units No change in NovoLog but likely needs to adjust at least her supper dose based on her portions and carbohydrates She will try to take her Invokana right before lunch and not in the mornings when she is not eating breakfast More consistent  exercise  Follow-up in 3 months    Patient Instructions  Walking daily  Take 46 Tresiba and may go to 48 if am still > 130  Take Invokana before 1st meal  [name] 08/15/2021, 9:23 AM   Note: This office note was prepared with Dragon voice recognition system technology. Any transcriptional errors that result from this process are unintentional.

## 2021-09-12 ENCOUNTER — Telehealth: Payer: Self-pay | Admitting: Pharmacist

## 2021-09-12 NOTE — Chronic Care Management (AMB) (Signed)
? ? ?Chronic Care Management ?Pharmacy Assistant  ? ?Name: Kimberly [name]  MRN: [id number] DOB: 07-30-1950 ? ?Reason for Encounter: Disease State ?  ?Conditions to be addressed/monitored: ?DMII ? ?Recent office visits:  ?None ? ?Recent consult visits:  ?08/15/21 [name], MD (Endocrinology) - Patient presented for Uncontrolled type 2 diabetes mellitus with hyperglycemia with long term current use of insulin and other concerns. Advised to Take 66 Tresiba and may go to 48 if am still > 130 & Take Invokana before 1st meal ? ?08/01/21 Valora Piccolo Barnabas Lister, RD (Nutritionist) - Patient presented for Diabetes mellitus with peripheral vascular disease. No medication changes. ? ?05/07/21 [name], MD (Endocrinology) - Patient presented for Uncontrolled type 2 diabetes mellitus with hyperglycemia with long term current use of insulin and other concerns. Advised to use 44 Tresiba ?If am sugar >130 then go to 46 ? ?05/06/21 McDonald, Stephan Minister, DPM (Podiatry) - Patient presented for Ingrowing left great toenail. No medication changes. ? ?04/22/21 Criselda Peaches, DPM (Podiatry) - Patient presented for Ingrowing left great toenail. No medication changes. ? ?Hospital visits:  ?None in previous 6 months ? ?Medications: ?Outpatient Encounter Medications as of 09/12/2021  ?Medication Sig  ? Accu-Chek FastClix Lancets MISC Use as instructed to check blood sugar 4 times daily.  ? ACCU-CHEK GUIDE test strip USE ACCU CHECK GUIDE TEST STRIPS AS INSTRUCTED TO CHECK BLOOD SUGAR 4 TIMES DAILY.  ? albuterol (VENTOLIN HFA) 108 (90 Base) MCG/ACT inhaler INHALE 2 PUFFS INTO THE LUNGS EVERY 6 HOURS AS NEEDED FOR SHORTNESS OF BREATH OR WHEEZING  ? aspirin 325 MG EC tablet Take 325 mg by mouth every morning.  ? atorvastatin (LIPITOR) 80 MG tablet TAKE 1 TABLET BY MOUTH EVERY DAY  ? Blood Glucose Monitoring Suppl (ACCU-CHEK GUIDE) w/Device KIT 1 each by Does not apply route 4 (four) times daily. Use Accu Chek to check blood sugar 4 times daily.  ? ezetimibe  (ZETIA) 10 MG tablet TAKE 1 TABLET BY MOUTH EVERY DAY  ? fenofibrate (TRICOR) 145 MG tablet TAKE 1 TABLET (145 MG TOTAL) BY MOUTH DAILY. STOP THE ZETIA  ? Insulin Pen Needle (BD PEN NEEDLE NANO U/F) 32G X 4 MM MISC Use as instructed to inject 3 insulin 3 times daily.  ? INVOKANA 300 MG TABS tablet TAKE 1 TABLET BY MOUTH EVERY DAY BEFORE BREAKFAST  ? levothyroxine (SYNTHROID) 50 MCG tablet TAKE 1 TABLET BY MOUTH EVERY DAY  ? lisinopril (ZESTRIL) 20 MG tablet TAKE 1 TABLET BY MOUTH EVERY DAY  ? NOVOLOG FLEXPEN 100 UNIT/ML FlexPen INJECT 18 UNITS UNDER THE SKIN AT LARGEST MEAL AND 10 UNITS AT DINNER  ? TRESIBA FLEXTOUCH 200 UNIT/ML FlexTouch Pen INJECT 42 UNITS INTO THE SKIN DAILY.  ? ?No facility-administered encounter medications on file as of 09/12/2021.  ?Recent Relevant Labs: ?Lab Results  ?Component Value Date/Time  ? HGBA1C 7.8 (H) 08/13/2021 08:14 AM  ? HGBA1C 8.2 (H) 05/02/2021 07:57 AM  ? MICROALBUR 1.0 12/26/2020 07:48 AM  ? MICROALBUR 10.0 (H) 04/24/2020 08:59 AM  ?  ?Kidney Function ?Lab Results  ?Component Value Date/Time  ? CREATININE 0.81 08/13/2021 08:14 AM  ? CREATININE 0.92 05/02/2021 07:57 AM  ? GFR 73.95 08/13/2021 08:14 AM  ? GFRNONAA 76 (L) 02/01/2012 06:30 AM  ? GFRAA 88 (L) 02/01/2012 06:30 AM  ? ? ?Current antihyperglycemic regimen:  ?Invokana $RemoveBe'300mg'[id number]$ , 1 tablet once daily ?Lantus, 43 units once daily at bedtime ?Novolog Flexpen, 18 units with largest meal and 10 units at dinner ?What  recent interventions/DTPs have been made to improve glycemic control:  ?Patient reports increase in her [place] Aas, she report she had been running on the higher end prior ?Have there been any recent hospitalizations or ED visits since last visit with CPP? No ?Patient denies hypoglycemic symptoms, including None ?Patient denies hyperglycemic symptoms, including none ?How often are you checking your blood sugar? 3-4 times daily ?What are your blood sugars ranging?  ?Before meals: 140 ?After meals: 120 ?Bedtime:  145 ?During the week, how often does your blood glucose drop below 70? Never ?Patient reports since her increase she sees her numbers decreasing they were higher than the above. ? ?Adherence Review: ?Is the patient currently on a STATIN medication? Yes ?Is the patient currently on ACE/ARB medication? No ?Does the patient have >5 day gap between last estimated fill dates? No ? ? ? ?Care Gaps: ?Zoster Vaccine - Overdue ?Colonoscopy - Overdue ?TDAP - Overdue ?COVID Booster - Overdue ?BP- 140/70 ( 08/15/21) ?AWV- 10/21 ?CCM- 5/23 ?Lab Results  ?Component Value Date  ? HGBA1C 7.8 (H) 08/13/2021  ? ? ?Star Rating Drugs: ?Atorvastatin (Lipitor) 80 mg - Last filled 08/08/21 [facility] ?Lisinopril (Zestril) 20 mg - Last filled 06/14/21 90 [facility] at [facility] ?Canagliflozin (Invokana) 300 mg - Last filled 09/07/21 [facility] at [facility] ? ?Ned Clines CMA ?Clinical Pharmacist Assistant ?[phone] ? ?

## 2021-09-18 ENCOUNTER — Other Ambulatory Visit: Payer: Self-pay | Admitting: Endocrinology

## 2021-09-18 ENCOUNTER — Other Ambulatory Visit: Payer: Self-pay | Admitting: Internal Medicine

## 2021-10-31 ENCOUNTER — Telehealth: Payer: Self-pay | Admitting: Pharmacist

## 2021-10-31 NOTE — Chronic Care Management (AMB) (Signed)
    Chronic Care Management Pharmacy Assistant   Name: Kimberly [name]  MRN: [id number] DOB: 1950/10/05  10/31/21  APPOINTMENT REMINDER   Attempted to reach patient was unable to get through busy signal Patient's son noted he would call her and remind her to have all medications, supplements and any blood glucose and blood pressure readings available for review with Jeni Salles, Pharm. D, for telephone visit on 10/31/21 at 11.    Care Gaps: Zoster Vaccine - Overdue Colonoscopy - Overdue TDAP - Overdue COVID Booster - Overdue BP- 140/70 ( 08/15/21) Lab Results  Component Value Date   HGBA1C 7.8 (H) 08/13/2021    Star Rating Drug: Atorvastatin (Lipitor) 80 mg - Last filled 10/28/21 [facility] Lisinopril (Zestril) 20 mg - Last filled 09/21/20 [facility] Canagliflozin (Invokana) 300 mg - Last filled 09/07/21 90 [facility]     Medications: Outpatient Encounter Medications as of 10/31/2021  Medication Sig   Accu-Chek FastClix Lancets MISC Use as instructed to check blood sugar 4 times daily.   ACCU-CHEK GUIDE test strip USE ACCU CHECK GUIDE TEST STRIPS AS INSTRUCTED TO CHECK BLOOD SUGAR 4 TIMES DAILY.   albuterol (VENTOLIN HFA) 108 (90 Base) MCG/ACT inhaler INHALE 2 PUFFS INTO THE LUNGS EVERY 6 HOURS AS NEEDED FOR SHORTNESS OF BREATH OR WHEEZING   aspirin 325 MG EC tablet Take 325 mg by mouth every morning.   atorvastatin (LIPITOR) 80 MG tablet TAKE 1 TABLET BY MOUTH EVERY DAY   Blood Glucose Monitoring Suppl (ACCU-CHEK GUIDE) w/Device KIT 1 each by Does not apply route 4 (four) times daily. Use Accu Chek to check blood sugar 4 times daily.   ezetimibe (ZETIA) 10 MG tablet TAKE 1 TABLET BY MOUTH EVERY DAY   fenofibrate (TRICOR) 145 MG tablet TAKE 1 TABLET (145 MG TOTAL) BY MOUTH DAILY. STOP THE ZETIA   Insulin Pen Needle (BD PEN NEEDLE NANO U/F) 32G X 4 MM MISC Use as instructed to inject 3 insulin 3 times daily.   INVOKANA 300 MG TABS tablet TAKE 1 TABLET BY MOUTH EVERY DAY BEFORE  BREAKFAST   levothyroxine (SYNTHROID) 50 MCG tablet TAKE 1 TABLET BY MOUTH EVERY DAY   lisinopril (ZESTRIL) 20 MG tablet TAKE 1 TABLET BY MOUTH EVERY DAY   NOVOLOG FLEXPEN 100 UNIT/ML FlexPen INJECT 18 UNITS UNDER THE SKIN AT LARGEST MEAL AND 10 UNITS AT DINNER   TRESIBA FLEXTOUCH 200 UNIT/ML FlexTouch Pen INJECT 42 UNITS INTO THE SKIN DAILY.   No facility-administered encounter medications on file as of 10/31/2021.      [place] Clinical Pharmacist Assistant [phone]

## 2021-11-01 ENCOUNTER — Telehealth: Payer: Medicare HMO

## 2021-11-12 DIAGNOSIS — Z961 Presence of intraocular lens: Secondary | ICD-10-CM | POA: Diagnosis not present

## 2021-11-12 DIAGNOSIS — H43813 Vitreous degeneration, bilateral: Secondary | ICD-10-CM | POA: Diagnosis not present

## 2021-11-12 DIAGNOSIS — E119 Type 2 diabetes mellitus without complications: Secondary | ICD-10-CM | POA: Diagnosis not present

## 2021-11-12 DIAGNOSIS — H2512 Age-related nuclear cataract, left eye: Secondary | ICD-10-CM | POA: Diagnosis not present

## 2021-11-12 DIAGNOSIS — H26491 Other secondary cataract, right eye: Secondary | ICD-10-CM | POA: Diagnosis not present

## 2021-11-12 LAB — HM DIABETES EYE EXAM

## 2021-11-13 ENCOUNTER — Other Ambulatory Visit (INDEPENDENT_AMBULATORY_CARE_PROVIDER_SITE_OTHER): Payer: Medicare HMO

## 2021-11-13 DIAGNOSIS — Z794 Long term (current) use of insulin: Secondary | ICD-10-CM

## 2021-11-13 DIAGNOSIS — E1165 Type 2 diabetes mellitus with hyperglycemia: Secondary | ICD-10-CM

## 2021-11-13 LAB — BASIC METABOLIC PANEL
BUN: 24 mg/dL — ABNORMAL HIGH (ref 6–23)
CO2: 26 mEq/L (ref 19–32)
Calcium: 10.6 mg/dL — ABNORMAL HIGH (ref 8.4–10.5)
Chloride: 101 mEq/L (ref 96–112)
Creatinine, Ser: 0.94 mg/dL (ref 0.40–1.20)
GFR: 61.74 mL/min (ref >60.00)
Glucose, Bld: 136 mg/dL — ABNORMAL HIGH (ref 70–99)
Potassium: 5 mEq/L (ref 3.5–5.1)
Sodium: 137 mEq/L (ref 135–145)

## 2021-11-13 LAB — MICROALBUMIN / CREATININE URINE RATIO
Creatinine,U: 45.4 mg/dL
Microalb Creat Ratio: 94.7 mg/g — ABNORMAL HIGH (ref 0.0–30.0)
Microalb, Ur: 43 mg/dL — ABNORMAL HIGH (ref 0.0–1.9)

## 2021-11-13 LAB — HEMOGLOBIN A1C: Hgb A1c MFr Bld: 7.2 % — ABNORMAL HIGH (ref 4.6–6.5)

## 2021-11-15 ENCOUNTER — Encounter: Payer: Self-pay | Admitting: Internal Medicine

## 2021-11-15 ENCOUNTER — Encounter: Payer: Self-pay | Admitting: Endocrinology

## 2021-11-15 ENCOUNTER — Ambulatory Visit: Payer: Medicare HMO | Admitting: Endocrinology

## 2021-11-15 VITALS — BP 142/78 | HR 62 | Ht 64.0 in | Wt 165.0 lb

## 2021-11-15 DIAGNOSIS — E782 Mixed hyperlipidemia: Secondary | ICD-10-CM

## 2021-11-15 DIAGNOSIS — Z794 Long term (current) use of insulin: Secondary | ICD-10-CM

## 2021-11-15 DIAGNOSIS — R809 Proteinuria, unspecified: Secondary | ICD-10-CM | POA: Diagnosis not present

## 2021-11-15 DIAGNOSIS — E1165 Type 2 diabetes mellitus with hyperglycemia: Secondary | ICD-10-CM

## 2021-11-15 DIAGNOSIS — E063 Autoimmune thyroiditis: Secondary | ICD-10-CM

## 2021-11-15 DIAGNOSIS — I10 Essential (primary) hypertension: Secondary | ICD-10-CM | POA: Diagnosis not present

## 2021-11-15 DIAGNOSIS — E1129 Type 2 diabetes mellitus with other diabetic kidney complication: Secondary | ICD-10-CM | POA: Diagnosis not present

## 2021-11-15 MED ORDER — IRBESARTAN 300 MG PO TABS: 300.0000 mg | ORAL_TABLET | Freq: Every day | ORAL | 3 refills | Status: DC

## 2021-11-15 NOTE — Patient Instructions (Addendum)
Novolog 16 before lunch  Change Lisinopril  Get BP monitor  Check blood sugars on waking up 4 days a week  Also check blood sugars about 2 hours after meals and do this after different meals by rotation  Recommended blood sugar levels on waking up are 90-130 and about 2 hours after meal is 130-160  Please bring your blood sugar monitor to each visit, thank you

## 2021-11-15 NOTE — Progress Notes (Signed)
Patient ID: Kimberly [name], female   DOB: [birth year], 71 y.o.   MRN: [id number]           Reason for Appointment: Endocrinology follow-up    History of Present Illness:          Date of diagnosis of type 2 diabetes mellitus: 1995        Background history:   For several years she had been tried on oral agents with variable control.  Detailed history not available She thinks that she had nausea and vomiting with metformin and could not take much Also had been tried on Amaryl  She thinks she had swelling with Januvia and not clear what other medications she has tried before  She was started on insulin in 07/2013 when her A1c was over 11% Because of her poor control and A1c of 9.1 she was started on Invokana 100 mg daily in 12/16  Recent history:   INSULIN regimen is: Tresiba U-200, 46 hs   Novolog 18 units before lunch. Novolog 10 acs    Non-insulin hypoglycemic drugs the patient is taking are:  Invokana 300 mg daily  Her A1c is slightly better at 7.2  A1c previously has been as low as 6.7   Current blood sugar patterns, management and problems identified: She did bring her monitor for download   Only minor changes to her insulin were done on the last visit with increasing Tresiba by 2 units  She is likely more active than before and overall blood sugars are improved given fasting  Although her main meal is at lunch and she is taking 18 units NovoLog at that time her sugars have been as low as 67 postprandially without symptoms  Rarely checking after dinner lately  Generally no hypoglycemic symptoms  Also her weight is down 2 pounds  No side effects with Invokana       Side effects from medications have been: Nausea from metformin,?  Swelling from Januvia  Glucose monitoring:  done 1-2 times a day         Glucometer:  Accu-Chek       Blood Glucose readings from monitor review   PRE-MEAL Fasting Lunch Dinner Bedtime Overall  Glucose range: 108-128      Mean/median:      102   POST-MEAL PC Breakfast PC Lunch PC Dinner  Glucose range:  67-144 90-167  Mean/median:        PRE-MEAL Fasting Lunch Dinner Bedtime Overall  Glucose range: 143-160      Mean/median:     145   POST-MEAL PC Breakfast PC Lunch PC Dinner  Glucose range:  101-132 84-176  Mean/median:        Self-care: The diet that the patient has been following is: tries to limit fats, breads.     Meal times are:  Lunch: 12 pm Dinner: 5-7 PM   Typical meal intake: Breakfast is minimal          Dietician visit, most recent: 09/2015                Weight history:  Wt Readings from Last 3 Encounters:  11/15/21 165 lb (74.8 kg)  08/15/21 167 lb 6.4 oz (75.9 kg)  08/01/21 167 lb (75.8 kg)    Glycemic control:   Lab Results  Component Value Date   HGBA1C 7.2 (H) 11/13/2021   HGBA1C 7.8 (H) 08/13/2021   HGBA1C 8.2 (H) 05/02/2021   Lab Results  Component Value Date   MICROALBUR  43.0 (H) 11/13/2021   LDLCALC 105 (H) 05/02/2021   CREATININE 0.94 11/13/2021    Other active problems discussed today: See review of systems   Allergies as of 11/15/2021       Reactions   Metformin And Related Nausea And Vomiting   After one pill at hospital   Januvia [sitagliptin Phosphate] Other (See Comments)   Swelling and edema see 4 13 OV        Medication List        Accurate as of November 15, 2021 11:59 PM. If you have any questions, ask your nurse or doctor.          STOP taking these medications    lisinopril 20 MG tablet Commonly known as: ZESTRIL Stopped by: [name], MD       TAKE these medications    Accu-Chek FastClix Lancets Misc Use as instructed to check blood sugar 4 times daily.   Accu-Chek Guide test strip Generic drug: glucose blood USE ACCU CHECK GUIDE TEST STRIPS AS INSTRUCTED TO CHECK BLOOD SUGAR 4 TIMES DAILY.   Accu-Chek Guide w/Device Kit 1 each by Does not apply route 4 (four) times daily. Use Accu Chek to check blood sugar 4 times daily.   albuterol  108 (90 Base) MCG/ACT inhaler Commonly known as: VENTOLIN HFA INHALE 2 PUFFS INTO THE LUNGS EVERY 6 HOURS AS NEEDED FOR SHORTNESS OF BREATH OR WHEEZING   aspirin EC 325 MG tablet Take 325 mg by mouth every morning.   atorvastatin 80 MG tablet Commonly known as: LIPITOR TAKE 1 TABLET BY MOUTH EVERY DAY   BD Pen Needle Nano U/F 32G X 4 MM Misc Generic drug: Insulin Pen Needle Use as instructed to inject 3 insulin 3 times daily.   ezetimibe 10 MG tablet Commonly known as: ZETIA TAKE 1 TABLET BY MOUTH EVERY DAY   fenofibrate 145 MG tablet Commonly known as: TRICOR TAKE 1 TABLET (145 MG TOTAL) BY MOUTH DAILY. STOP THE ZETIA   Invokana 300 MG Tabs tablet Generic drug: canagliflozin TAKE 1 TABLET BY MOUTH EVERY DAY BEFORE BREAKFAST   irbesartan 300 MG tablet Commonly known as: Avapro Take 1 tablet (300 mg total) by mouth daily. Started by: [name], MD   levothyroxine 50 MCG tablet Commonly known as: SYNTHROID TAKE 1 TABLET BY MOUTH EVERY DAY   NovoLOG FlexPen 100 UNIT/ML FlexPen Generic drug: insulin aspart INJECT 18 UNITS UNDER THE SKIN AT LARGEST MEAL AND 10 UNITS AT Wonda Cheng   Tyler Aas FlexTouch 200 UNIT/ML FlexTouch Pen Generic drug: insulin degludec INJECT 42 UNITS INTO THE SKIN DAILY.        Allergies:  Allergies  Allergen Reactions   Metformin And Related Nausea And Vomiting    After one pill at hospital   Januvia [Sitagliptin Phosphate] Other (See Comments)    Swelling and edema see 4 13 OV    Past Medical History:  Diagnosis Date   CAD (coronary artery disease)    Colitis    Colitis, indeterminate 01/31/2012   COPD (chronic obstructive pulmonary disease) ([place])    Depression 05/16/2017   Depression with anxiety 05/30/2016   Diverticulitis 02/10/2012   DM (diabetes mellitus) ([place])    Glaucoma    Glaucoma    History of ventricular fibrillation    HTN (hypertension)    Hyperlipidemia    Myocardial infarction ([place])    12/1998, 01/2011    Osteoporosis    PVD (peripheral vascular disease) ([place])     Past Surgical History:  Procedure Laterality Date   ABDOMINAL HYSTERECTOMY     endometriosis   CABG x 3  01/22/2011   Dr Cyndia Bent   CARDIAC CATHETERIZATION  01/17/2011   Dr Arvella Merles   LIPOMA EXCISION Left 02/08/2015   Procedure: EXCISION OF LEFT THIGH LIPOMA;  Surgeon: Greer Pickerel, MD;  Location: [facility];  Service: General;  Laterality: Left;   S/P aortobifemoral bypass  2008   Dr Donnetta Hutching   S/P myocardial infarction and PCI of the lt circumflex  2000    Family History  Problem Relation Age of Onset   Heart attack Father    Diabetes Father    Heart disease Father    Diabetes Brother    Diabetes Mother    Liver cancer Mother    Hypertension Other    Thyroid disease Other        sisters- sister had parathyroidectomy   Alcohol abuse Other        brother   Colon polyps Maternal Uncle    Breast cancer Sister    Diabetes Sister    Thyroid disease Sister     Social History:  reports that she has been smoking cigarettes. She has been smoking an average of 1 pack per day. She has never used smokeless tobacco. She reports that she does not drink alcohol and does not use drugs.    Review of Systems    Lipid history: On 80 mg Lipitor  with history of CAD Prescribed by PCP Also on fenofibrate and Zetia LDL has come down with her going back on Zetia as directed  Lab Results  Component Value Date   CHOL 132 08/13/2021   CHOL 173 05/02/2021   CHOL 123 02/26/2021   Lab Results  Component Value Date   HDL 25.60 (L) 08/13/2021   HDL 30.90 (L) 05/02/2021   HDL 25.20 (L) 02/26/2021   Lab Results  Component Value Date   LDLCALC 105 (H) 05/02/2021   LDLCALC 51 01/31/2020   LDLCALC 50 02/03/2019   Lab Results  Component Value Date   TRIG 214.0 (H) 08/13/2021   TRIG 185.0 (H) 05/02/2021   TRIG 242.0 (H) 02/26/2021   Lab Results  Component Value Date   CHOLHDL 5 08/13/2021   CHOLHDL 6 05/02/2021    CHOLHDL 5 02/26/2021   Lab Results  Component Value Date   LDLDIRECT 77.0 08/13/2021   LDLDIRECT 66.0 02/26/2021   LDLDIRECT 65.0 12/26/2020            Hypertension: Present for several years  Blood pressure not checked at home  She is being treated with LISINOPRIL 20 mg daily Blood pressure was the same checked twice today She does have microalbuminuria  BP Readings from Last 3 Encounters:  11/15/21 (!) 142/78  08/15/21 140/70  05/07/21 (!) 142/78    HYPERCALCEMIA: She has had periodic mild hypercalcemia since about 2010; PTH levels in the mid-upper range Calcium is generally upper normal and stable  Lab Results  Component Value Date   CALCIUM 10.6 (H) 11/13/2021    She is taking 1000 units of vitamin D with adequate vitamin D levels  OSTEOPENIA:  No history of fractures  Her last bone density was in 4/22  Results:   Lumbar spine L1-L4(L3) Femoral neck (FN)  T-score 0.2 RFN: -1.0 LFN: -1.3  Change in BMD from previous DXA test (%) Down 0.6% Down 2.0%    She was tried on Evista but she says it was causing her hot flashes and night  sweats and she stopped taking this  Previously in 07/2015 T score -2.3 was at the wrist, this was not measured in 2019  Most recent eye exam was normal in 10/2018 and she does go every year  Most recent foot exam: 11/22   HYPOTHYROIDISM:  She has a family history of hypothyroidism, Graves' disease and thyroid nodules  On her visit in 11/20 she was complaining of feeling more tired as well as colder; had some weight gain also.   Baseline TSH was 6  With levothyroxine 25 mcg daily she did not feel any better and with TSH still 4.5 she was told to go up to 50 mcg in 07/2019 With a higher dose she had less fatigue  She takes her levothyroxine every morning  Her TSH is consistently normal  Lab Results  Component Value Date   TSH 3.30 08/13/2021   TSH 2.68 12/26/2020   TSH 3.62 08/28/2020   FREET4 0.97 12/26/2020   FREET4  1.11 01/31/2020   FREET4 1.23 10/24/2019     Review of Systems    LABS:  Abstract on 11/15/2021  Component Date Value Ref Range Status   HM Diabetic Eye Exam 11/12/2021 No Retinopathy  No Retinopathy Final   [facility]  Lab on 11/13/2021  Component Date Value Ref Range Status   Microalb, Ur 11/13/2021 43.0 (H)  0.0 - 1.9 mg/dL Final   Creatinine,U 11/13/2021 45.4  mg/dL Final   Microalb Creat Ratio 11/13/2021 94.7 (H)  0.0 - 30.0 mg/g Final   Sodium 11/13/2021 137  135 - 145 mEq/L Final   Potassium 11/13/2021 5.0  3.5 - 5.1 mEq/L Final   Chloride 11/13/2021 101  96 - 112 mEq/L Final   CO2 11/13/2021 26  19 - 32 mEq/L Final   Glucose, Bld 11/13/2021 136 (H)  70 - 99 mg/dL Final   BUN 11/13/2021 24 (H)  6 - 23 mg/dL Final   Creatinine, Ser 11/13/2021 0.94  0.40 - 1.20 mg/dL Final   GFR 11/13/2021 61.74  >60.00 mL/min Final   Calculated using the CKD-EPI Creatinine Equation (2021)   Calcium 11/13/2021 10.6 (H)  8.4 - 10.5 mg/dL Final   Hgb A1c MFr Bld 11/13/2021 7.2 (H)  4.6 - 6.5 % Final   Glycemic Control Guidelines for People with Diabetes:Non Diabetic:  <6%Goal of Therapy: <7%Additional Action Suggested:  >8%     Physical Examination:  BP (!) 142/78   Pulse 62   Ht $R'5\' 4"'jp$  (1.626 m)   Wt 165 lb (74.8 kg)   SpO2 90%   BMI 28.32 kg/m     ASSESSMENT:  Diabetes type 2, mildly obese  See history of present illness for detailed discussion of his current management, blood sugar patterns and problems identified  A1c is continuing to improve, now 7.2   She is on basal bolus insulin with Invokana 300 mg  Her improvement is likely from better diet and slightly increased [place] She has relatively higher blood sugars at home but not clear why her A1c is significantly higher  There is been no change in her medication compliance May not be as consistent on exercise and diet and has gained some weight  MICROALBUMINURIA: This is not controlled with using 20  mg lisinopril at this time despite better diabetes control Blood pressure is high normal  HYPOTHYROIDISM: Mild TSH levels have been normal with 50 mcg levothyroxine  Hypertension: Blood pressure is upper normal plan we will try her on Avapro 300 instead lisinopril 20  HYPERLIPIDEMIA:   Follow-up on next visit  PLAN:    She will start checking her blood sugars more consistently after supper She will go down at least 2 units on her NOVOLOG at lunch Continue to be as active as possible Call if having any low sugars Consider using freestyle libre although she is still comfortable to the fingersticks  Follow-up in 3 months    Patient Instructions  Novolog 16 before lunch  Change Lisinopril  Get BP monitor  Check blood sugars on waking up 4 days a week  Also check blood sugars about 2 hours after meals and do this after different meals by rotation  Recommended blood sugar levels on waking up are 90-130 and about 2 hours after meal is 130-160  Please bring your blood sugar monitor to each visit, thank you   [name] 11/16/2021, 6:18 PM   Note: This office note was prepared with Dragon voice recognition system technology. Any transcriptional errors that result from this process are unintentional.

## 2021-11-24 ENCOUNTER — Other Ambulatory Visit: Payer: Self-pay | Admitting: Endocrinology

## 2021-11-25 ENCOUNTER — Encounter: Payer: Self-pay | Admitting: Endocrinology

## 2022-02-06 ENCOUNTER — Other Ambulatory Visit: Payer: Self-pay | Admitting: Internal Medicine

## 2022-02-13 ENCOUNTER — Other Ambulatory Visit: Payer: Self-pay | Admitting: Endocrinology

## 2022-02-18 ENCOUNTER — Other Ambulatory Visit (INDEPENDENT_AMBULATORY_CARE_PROVIDER_SITE_OTHER): Payer: Medicare HMO

## 2022-02-18 DIAGNOSIS — E063 Autoimmune thyroiditis: Secondary | ICD-10-CM

## 2022-02-18 DIAGNOSIS — E1165 Type 2 diabetes mellitus with hyperglycemia: Secondary | ICD-10-CM

## 2022-02-18 DIAGNOSIS — E782 Mixed hyperlipidemia: Secondary | ICD-10-CM | POA: Diagnosis not present

## 2022-02-18 DIAGNOSIS — Z794 Long term (current) use of insulin: Secondary | ICD-10-CM

## 2022-02-18 LAB — COMPREHENSIVE METABOLIC PANEL
ALT: 29 U/L (ref 0–35)
AST: 28 U/L (ref 0–37)
Albumin: 4.3 g/dL (ref 3.5–5.2)
Alkaline Phosphatase: 72 U/L (ref 39–117)
BUN: 21 mg/dL (ref 6–23)
CO2: 27 mEq/L (ref 19–32)
Calcium: 10.3 mg/dL (ref 8.4–10.5)
Chloride: 101 mEq/L (ref 96–112)
Creatinine, Ser: 0.87 mg/dL (ref 0.40–1.20)
GFR: 67.63 mL/min (ref >60.00)
Glucose, Bld: 122 mg/dL — ABNORMAL HIGH (ref 70–99)
Potassium: 4.5 mEq/L (ref 3.5–5.1)
Sodium: 137 mEq/L (ref 135–145)
Total Bilirubin: 0.5 mg/dL (ref 0.2–1.2)
Total Protein: 7.7 g/dL (ref 6.0–8.3)

## 2022-02-18 LAB — LIPID PANEL
Cholesterol: 143 mg/dL (ref 0–200)
HDL: 24.8 mg/dL — ABNORMAL LOW (ref >39.00)
LDL Cholesterol: 80 mg/dL (ref 0–99)
NonHDL: 117.75
Total CHOL/HDL Ratio: 6
Triglycerides: 187 mg/dL — ABNORMAL HIGH (ref 0.0–149.0)
VLDL: 37.4 mg/dL (ref 0.0–40.0)

## 2022-02-18 LAB — HEMOGLOBIN A1C: Hgb A1c MFr Bld: 7.3 % — ABNORMAL HIGH (ref 4.6–6.5)

## 2022-02-18 LAB — TSH: TSH: 3.64 u[IU]/mL (ref 0.35–5.50)

## 2022-02-21 ENCOUNTER — Encounter: Payer: Self-pay | Admitting: Endocrinology

## 2022-02-21 ENCOUNTER — Ambulatory Visit: Payer: Medicare HMO | Admitting: Endocrinology

## 2022-02-21 VITALS — BP 144/70 | HR 83 | Ht 64.0 in | Wt 162.6 lb

## 2022-02-21 DIAGNOSIS — Z794 Long term (current) use of insulin: Secondary | ICD-10-CM

## 2022-02-21 DIAGNOSIS — E1165 Type 2 diabetes mellitus with hyperglycemia: Secondary | ICD-10-CM

## 2022-02-21 DIAGNOSIS — E782 Mixed hyperlipidemia: Secondary | ICD-10-CM | POA: Diagnosis not present

## 2022-02-21 DIAGNOSIS — E063 Autoimmune thyroiditis: Secondary | ICD-10-CM | POA: Diagnosis not present

## 2022-02-21 DIAGNOSIS — I10 Essential (primary) hypertension: Secondary | ICD-10-CM | POA: Diagnosis not present

## 2022-02-21 NOTE — Progress Notes (Signed)
Patient ID: Kimberly [name], female   DOB: [birth year], 71 y.o.   MRN: [id number]           Reason for Appointment: Endocrinology follow-up    History of Present Illness:          Date of diagnosis of type 2 diabetes mellitus: 1995        Background history:   For several years she had been tried on oral agents with variable control.  Detailed history not available She thinks that she had nausea and vomiting with metformin and could not take much Also had been tried on Amaryl  She thinks she had swelling with Januvia and not clear what other medications she has tried before  She was started on insulin in 07/2013 when her A1c was over 11% Because of her poor control and A1c of 9.1 she was started on Invokana 100 mg daily in 12/16  Recent history:   INSULIN regimen is: Tresiba U-200, 42 hs   Novolog 18 units before lunch. Novolog 10 acs    Non-insulin hypoglycemic drugs the patient is taking are:  Invokana 300 mg daily  Her A1c is about the same at 7.2, was 7.3  A1c previously has been as low as 6.7   Current blood sugar patterns, management and problems identified: She did bring her monitor for download   She has been reluctant to consider the freestyle libre system and is still not interested Although she was supposed to take only 16 units NovoLog at lunch she is still taking 18 However her carbohydrate intake at lunch can be quite variable and yesterday with only a bite of corn and no other carbohydrates her blood sugar was 92 postprandially Blood sugars after dinner are variable but no consistent pattern Unable to download her meter again today and most of her readings recently are fasting and after lunch She is generally trying to be a little more active with some walking Weight is about the same or lower, gradually coming down this year She takes her Invokana regularly also       Side effects from medications have been: Nausea from metformin,?  Swelling from  Januvia  Glucose monitoring:  done 1-2 times a day         Glucometer:  Accu-Chek       Blood Glucose readings from monitor review   PRE-MEAL Fasting Lunch Dinner Bedtime Overall  Glucose range: 111-134      Mean/median:     122   POST-MEAL PC Breakfast PC Lunch PC Dinner  Glucose range:  92-218 129, 179  Mean/median:      Previously:  PRE-MEAL Fasting Lunch Dinner Bedtime Overall  Glucose range: 108-128      Mean/median:     102   POST-MEAL PC Breakfast PC Lunch PC Dinner  Glucose range:  67-144 90-167  Mean/median:          Self-care: The diet that the patient has been following is: tries to limit fats, breads.     Meal times are:  Lunch: 12 pm Dinner: 5-7 PM   Typical meal intake: Breakfast is minimal          Dietician visit, most recent: 09/2015                Weight history:  Wt Readings from Last 3 Encounters:  02/21/22 162 lb 9.6 oz (73.8 kg)  11/15/21 165 lb (74.8 kg)  08/15/21 167 lb 6.4 oz (75.9 kg)  Glycemic control:   Lab Results  Component Value Date   HGBA1C 7.3 (H) 02/18/2022   HGBA1C 7.2 (H) 11/13/2021   HGBA1C 7.8 (H) 08/13/2021   Lab Results  Component Value Date   MICROALBUR 43.0 (H) 11/13/2021   LDLCALC 80 02/18/2022   CREATININE 0.87 02/18/2022    Other active problems discussed today: See review of systems   Allergies as of 02/21/2022       Reactions   Metformin And Related Nausea And Vomiting   After one pill at hospital   Januvia [sitagliptin Phosphate] Other (See Comments)   Swelling and edema see 4 13 OV        Medication List        Accurate as of February 21, 2022  8:28 AM. If you have any questions, ask your nurse or doctor.          Accu-Chek FastClix Lancets Misc Use as instructed to check blood sugar 4 times daily.   Accu-Chek Guide test strip Generic drug: glucose blood USE ACCU CHECK GUIDE TEST STRIPS AS INSTRUCTED TO CHECK BLOOD SUGAR 4 TIMES DAILY.   Accu-Chek Guide w/Device Kit 1 each by  Does not apply route 4 (four) times daily. Use Accu Chek to check blood sugar 4 times daily.   albuterol 108 (90 Base) MCG/ACT inhaler Commonly known as: VENTOLIN HFA INHALE 2 PUFFS INTO THE LUNGS EVERY 6 HOURS AS NEEDED FOR SHORTNESS OF BREATH OR WHEEZING   aspirin EC 325 MG tablet Take 325 mg by mouth every morning.   atorvastatin 80 MG tablet Commonly known as: LIPITOR TAKE 1 TABLET BY MOUTH EVERY DAY   BD Pen Needle Nano U/F 32G X 4 MM Misc Generic drug: Insulin Pen Needle Use as instructed to inject 3 insulin 3 times daily.   ezetimibe 10 MG tablet Commonly known as: ZETIA TAKE 1 TABLET BY MOUTH EVERY DAY   fenofibrate 145 MG tablet Commonly known as: TRICOR TAKE 1 TABLET (145 MG TOTAL) BY MOUTH DAILY. STOP THE ZETIA   Invokana 300 MG Tabs tablet Generic drug: canagliflozin TAKE 1 TABLET BY MOUTH EVERY DAY BEFORE BREAKFAST   irbesartan 300 MG tablet Commonly known as: AVAPRO TAKE 1 TABLET BY MOUTH EVERY DAY   levothyroxine 50 MCG tablet Commonly known as: SYNTHROID TAKE 1 TABLET BY MOUTH EVERY DAY   NovoLOG FlexPen 100 UNIT/ML FlexPen Generic drug: insulin aspart INJECT 18 UNITS UNDER THE SKIN AT LARGEST MEAL AND 10 UNITS AT [facility]   Tyler Aas FlexTouch 200 UNIT/ML FlexTouch Pen Generic drug: insulin degludec INJECT 42 UNITS INTO THE SKIN DAILY.        Allergies:  Allergies  Allergen Reactions   Metformin And Related Nausea And Vomiting    After one pill at hospital   Januvia [Sitagliptin Phosphate] Other (See Comments)    Swelling and edema see 4 13 OV    Past Medical History:  Diagnosis Date   CAD (coronary artery disease)    Colitis    Colitis, indeterminate 01/31/2012   COPD (chronic obstructive pulmonary disease) ([place])    Depression 05/16/2017   Depression with anxiety 05/30/2016   Diverticulitis 02/10/2012   DM (diabetes mellitus) ([place])    Glaucoma    Glaucoma    History of ventricular fibrillation    HTN (hypertension)     Hyperlipidemia    Myocardial infarction ([place])    12/1998, 01/2011   Osteoporosis    PVD (peripheral vascular disease) ([place])     Past Surgical History:  Procedure Laterality Date   ABDOMINAL HYSTERECTOMY     endometriosis   CABG x 3  01/22/2011   Dr Cyndia Bent   CARDIAC CATHETERIZATION  01/17/2011   Dr Arvella Merles   LIPOMA EXCISION Left 02/08/2015   Procedure: EXCISION OF LEFT THIGH LIPOMA;  Surgeon: Greer Pickerel, MD;  Location: [facility];  Service: General;  Laterality: Left;   S/P aortobifemoral bypass  2008   Dr Donnetta Hutching   S/P myocardial infarction and PCI of the lt circumflex  2000    Family History  Problem Relation Age of Onset   Heart attack Father    Diabetes Father    Heart disease Father    Diabetes Brother    Diabetes Mother    Liver cancer Mother    Hypertension Other    Thyroid disease Other        sisters- sister had parathyroidectomy   Alcohol abuse Other        brother   Colon polyps Maternal Uncle    Breast cancer Sister    Diabetes Sister    Thyroid disease Sister     Social History:  reports that she has been smoking cigarettes. She has been smoking an average of 1 pack per day. She has never used smokeless tobacco. She reports that she does not drink alcohol and does not use drugs.    Review of Systems    Lipid history: On 80 mg Lipitor  with history of CAD Prescribed by PCP Also on fenofibrate and Zetia   Lab Results  Component Value Date   CHOL 143 02/18/2022   CHOL 132 08/13/2021   CHOL 173 05/02/2021   Lab Results  Component Value Date   HDL 24.80 (L) 02/18/2022   HDL 25.60 (L) 08/13/2021   HDL 30.90 (L) 05/02/2021   Lab Results  Component Value Date   LDLCALC 80 02/18/2022   LDLCALC 105 (H) 05/02/2021   LDLCALC 51 01/31/2020   Lab Results  Component Value Date   TRIG 187.0 (H) 02/18/2022   TRIG 214.0 (H) 08/13/2021   TRIG 185.0 (H) 05/02/2021   Lab Results  Component Value Date   CHOLHDL 6 02/18/2022   CHOLHDL 5  08/13/2021   CHOLHDL 6 05/02/2021   Lab Results  Component Value Date   LDLDIRECT 77.0 08/13/2021   LDLDIRECT 66.0 02/26/2021   LDLDIRECT 65.0 12/26/2020            Hypertension: Present for several years  Blood pressure at home 118-138/78-82 Higher today from stress  She does have microalbuminuria  BP Readings from Last 3 Encounters:  02/21/22 (!) 144/70  11/15/21 (!) 142/78  08/15/21 140/70    HYPERCALCEMIA: She has had periodic mild hypercalcemia since about 2010; PTH levels in the mid-upper range Calcium is generally upper normal and stable  Lab Results  Component Value Date   CALCIUM 10.3 02/18/2022    She is taking 1000 units of vitamin D with adequate vitamin D levels  OSTEOPENIA:  No history of fractures  Her last bone density was in 4/22  Results:   Lumbar spine L1-L4(L3) Femoral neck (FN)  T-score 0.2 RFN: -1.0 LFN: -1.3  Change in BMD from previous DXA test (%) Down 0.6% Down 2.0%    She was tried on Evista but she says it was causing her hot flashes and night sweats and she stopped taking this  Previously in 07/2015 T score -2.3 was at the wrist, this was not measured in 2019  Most recent  eye exam was normal in 10/2018 and she does go every year  Most recent foot exam: 11/22   HYPOTHYROIDISM:  She has a family history of hypothyroidism, Graves' disease and thyroid nodules  On her visit in 11/20 she was complaining of feeling more tired as well as colder; had some weight gain also.   Baseline TSH was 6  With levothyroxine 25 mcg daily she did not feel any better and with TSH still 4.5 she was told to go up to 50 mcg in 07/2019  No recent fatigue Has taken her levothyroxine consistently before breakfast daily  Her TSH is consistently normal  Lab Results  Component Value Date   TSH 3.64 02/18/2022   TSH 3.30 08/13/2021   TSH 2.68 12/26/2020   FREET4 0.97 12/26/2020   FREET4 1.11 01/31/2020   FREET4 1.23 10/24/2019     Review of  Systems    LABS:  Lab on 02/18/2022  Component Date Value Ref Range Status   TSH 02/18/2022 3.64  0.35 - 5.50 uIU/mL Final   Cholesterol 02/18/2022 143  0 - 200 mg/dL Final   ATP III Classification       Desirable:  < 200 mg/dL               Borderline High:  200 - 239 mg/dL          High:  > = 240 mg/dL   Triglycerides 02/18/2022 187.0 (H)  0.0 - 149.0 mg/dL Final   Normal:  <150 mg/dLBorderline High:  150 - 199 mg/dL   HDL 02/18/2022 24.80 (L)  >39.00 mg/dL Final   VLDL 02/18/2022 37.4  0.0 - 40.0 mg/dL Final   LDL Cholesterol 02/18/2022 80  0 - 99 mg/dL Final   Total CHOL/HDL Ratio 02/18/2022 6   Final                  Men          Women1/2 Average Risk     3.4          3.3Average Risk          5.0          4.42X Average Risk          9.6          7.13X Average Risk          15.0          11.0                       NonHDL 02/18/2022 117.75   Final   NOTE:  Non-HDL goal should be 30 mg/dL higher than patient's LDL goal (i.e. LDL goal of < 70 mg/dL, would have non-HDL goal of < 100 mg/dL)   Sodium 02/18/2022 137  135 - 145 mEq/L Final   Potassium 02/18/2022 4.5  3.5 - 5.1 mEq/L Final   Chloride 02/18/2022 101  96 - 112 mEq/L Final   CO2 02/18/2022 27  19 - 32 mEq/L Final   Glucose, Bld 02/18/2022 122 (H)  70 - 99 mg/dL Final   BUN 02/18/2022 21  6 - 23 mg/dL Final   Creatinine, Ser 02/18/2022 0.87  0.40 - 1.20 mg/dL Final   Total Bilirubin 02/18/2022 0.5  0.2 - 1.2 mg/dL Final   Alkaline Phosphatase 02/18/2022 72  39 - 117 U/L Final   AST 02/18/2022 28  0 - 37 U/L Final   ALT 02/18/2022 29  0 -  35 U/L Final   Total Protein 02/18/2022 7.7  6.0 - 8.3 g/dL Final   Albumin 02/18/2022 4.3  3.5 - 5.2 g/dL Final   GFR 02/18/2022 67.63  >60.00 mL/min Final   Calculated using the CKD-EPI Creatinine Equation (2021)   Calcium 02/18/2022 10.3  8.4 - 10.5 mg/dL Final   Hgb A1c MFr Bld 02/18/2022 7.3 (H)  4.6 - 6.5 % Final   Glycemic Control Guidelines for People with Diabetes:Non Diabetic:   <6%Goal of Therapy: <7%Additional Action Suggested:  >8%     Physical Examination:  BP (!) 144/70   Pulse 83   Ht _0  (1.626 m)   Wt 162 lb 9.6 oz (73.8 kg)   SpO2 92%   BMI 27.91 kg/m     ASSESSMENT:  Diabetes type 2, mildly obese  See history of present illness for detailed discussion of his current management, blood sugar patterns and problems identified  A1c is reasonably good at 7.3 also has been better before   She is on basal bolus insulin with Invokana 300 mg  No consistent pattern of abnormal blood sugars with only one high reading when she forgot her insulin Currently not adjusting mealtime dose based on what she is eating  MICROALBUMINURIA: Needs follow-up on next visit  HYPOTHYROIDISM: Mild TSH levels have been normal with 50 mcg levothyroxine  Hypertension: Blood pressure is upper normal here but better at home, she feels stressed today   HYPERLIPIDEMIA: On a 3 drug regimen with good control Except for mild increase in triglycerides LDL is well controlled   PLAN:    She will adjust her mealtime dose based on her carbohydrates and discussed types of carbohydrates that will make her blood sugars go up At lunchtime she can likely take 16 units NovoLog if eating only a small amount of carbohydrate Otherwise take 18 units Also adjust suppertime dose by 2 units if needed No change in basal insulin Continue Invokana  May consider CGM if her A1c goes up    Follow-up in 3 months    There are no Patient Instructions on file for this visit.   Kimberly [name] 02/21/2022, 8:28 AM   Note: This office note was prepared with Dragon voice recognition system technology. Any transcriptional errors that result from this process are unintentional.

## 2022-02-21 NOTE — Patient Instructions (Signed)
16-18 Novolog at lunch

## 2022-02-26 ENCOUNTER — Other Ambulatory Visit: Payer: Self-pay | Admitting: Endocrinology

## 2022-03-04 ENCOUNTER — Ambulatory Visit (INDEPENDENT_AMBULATORY_CARE_PROVIDER_SITE_OTHER): Payer: Medicare HMO | Admitting: Internal Medicine

## 2022-03-04 ENCOUNTER — Encounter: Payer: Medicare HMO | Admitting: Internal Medicine

## 2022-03-04 ENCOUNTER — Encounter: Payer: Self-pay | Admitting: Internal Medicine

## 2022-03-04 VITALS — BP 162/80 | HR 87 | Temp 97.9°F | Ht 63.25 in | Wt 161.6 lb

## 2022-03-04 DIAGNOSIS — Z23 Encounter for immunization: Secondary | ICD-10-CM | POA: Diagnosis not present

## 2022-03-04 DIAGNOSIS — E1151 Type 2 diabetes mellitus with diabetic peripheral angiopathy without gangrene: Secondary | ICD-10-CM

## 2022-03-04 DIAGNOSIS — Z0000 Encounter for general adult medical examination without abnormal findings: Secondary | ICD-10-CM

## 2022-03-04 DIAGNOSIS — E785 Hyperlipidemia, unspecified: Secondary | ICD-10-CM

## 2022-03-04 DIAGNOSIS — I10 Essential (primary) hypertension: Secondary | ICD-10-CM | POA: Diagnosis not present

## 2022-03-04 DIAGNOSIS — E1169 Type 2 diabetes mellitus with other specified complication: Secondary | ICD-10-CM

## 2022-03-04 DIAGNOSIS — D751 Secondary polycythemia: Secondary | ICD-10-CM | POA: Diagnosis not present

## 2022-03-04 DIAGNOSIS — Z79899 Other long term (current) drug therapy: Secondary | ICD-10-CM | POA: Diagnosis not present

## 2022-03-04 DIAGNOSIS — Z720 Tobacco use: Secondary | ICD-10-CM

## 2022-03-04 DIAGNOSIS — Z5320 Procedure and treatment not carried out because of patient's decision for unspecified reasons: Secondary | ICD-10-CM | POA: Diagnosis not present

## 2022-03-04 LAB — CBC WITH DIFFERENTIAL/PLATELET
Basophils Absolute: 0 10*3/uL (ref 0.0–0.1)
Basophils Relative: 0.6 % (ref 0.0–3.0)
Eosinophils Absolute: 0.1 10*3/uL (ref 0.0–0.7)
Eosinophils Relative: 1.9 % (ref 0.0–5.0)
HCT: 46.3 % — ABNORMAL HIGH (ref 36.0–46.0)
Hemoglobin: 15.4 g/dL — ABNORMAL HIGH (ref 12.0–15.0)
Lymphocytes Relative: 18.2 % (ref 12.0–46.0)
Lymphs Abs: 1.3 10*3/uL (ref 0.7–4.0)
MCHC: 33.3 g/dL (ref 30.0–36.0)
MCV: 87.1 fl (ref 78.0–100.0)
Monocytes Absolute: 0.5 10*3/uL (ref 0.1–1.0)
Monocytes Relative: 6.8 % (ref 3.0–12.0)
Neutro Abs: 5.3 10*3/uL (ref 1.4–7.7)
Neutrophils Relative %: 72.5 % (ref 43.0–77.0)
Platelets: 163 10*3/uL (ref 150.0–400.0)
RBC: 5.32 Mil/uL — ABNORMAL HIGH (ref 3.87–5.11)
RDW: 15.7 % — ABNORMAL HIGH (ref 11.5–15.5)
WBC: 7.4 10*3/uL (ref 4.0–10.5)

## 2022-03-04 NOTE — Progress Notes (Signed)
Chief Complaint  Patient presents with   Annual Exam    HPI: Patient  Kimberly [name]  71 y.o. comes in today for Preventive Health Care visit  Dm   managed evaluated by Dr. Dwyane Dee.  A1c is fairly on Tresiba Invokana A1c is 7.3 last check BP has been normal 128 range at home  HLD  on meds including atorvastatin Zetia Tricor. TOvacco still   denies sig sob but has used inhaler when outside moving lawn  no fever hemoptysis  Blood pressure on Avapro for a while had been controlled uncertain how many times it is elevated. Declines mammogram Thyroid on levothyroxine 50 mcg a day. Overall she is very good on adherence compliance of her medicines. Health Maintenance  Topic Date Due   Zoster Vaccines- Shingrix (1 of 2) Never done   COLONOSCOPY (Pts 45-8yr Insurance coverage will need to be confirmed)  04/24/2015   TETANUS/TDAP  01/31/2019   COVID-19 Vaccine (2 - Pfizer risk series) 02/09/2020   FOOT EXAM  05/07/2022   HEMOGLOBIN A1C  08/19/2022   OPHTHALMOLOGY EXAM  11/13/2022   Diabetic kidney evaluation - Urine ACR  11/14/2022   Diabetic kidney evaluation - GFR measurement  02/19/2023   Pneumonia Vaccine 71 Years old  Completed   INFLUENZA VACCINE  Completed   DEXA SCAN  Completed   Hepatitis C Screening  Completed   HPV VACCINES  Aged Out   Health Maintenance Review LIFESTYLE:  Exercise: Mows lawn walks Tobacco/ETS 1ppd Alcoholn  Sugar beverages:n Sleep 8-10 hours  Drug use: no HH of 1 no pets visits sick relative nephew and grandchildren once a week ROS:  GREST of 12 system review negative except as per HPI denies chest pain claudication neuro sx are new.  New   Past Medical History:  Diagnosis Date   CAD (coronary artery disease)    Colitis    Colitis, indeterminate 01/31/2012   COPD (ch[place] obstructive pulmonary disease) [place]    Depression 05/16/2017   Depression with anxiety 05/30/2016   Diverticulitis 02/10/2012   DM (diabetes mellitus) (HCC)    Glaucoma     Glaucoma    History of ventricular fibrillation    HTN (hypertension)    Hyperlipidemia    Myocardial infarction ([facility]    12/1998, 01/2011   Osteoporosis    PVD [place]ipheral vascular disease) [place]     Past Surgical History:  Procedure Laterality Date   ABDOMINAL HYSTERECTOMY     endometriosis   CABG x 3  01/22/2011   Dr BCyndia Bent  CARDIAC CATHETERIZATION  01/17/2011   Dr BArvella Merles  LIPOMA EXCISION Left 02/08/2015   Procedure: EXCISION OF LEFT THIGH LIPOMA;  Surgeon: EGreer Pickerel MD;  [place] [facility]  Service: General;  Laterality: Left;   S/P aortobifemoral bypass  2008   Dr EDonnetta Hutching  S/P myocardial infarction and PCI of the lt circumflex  2000    Family History  Problem Relation Age of Onset   Heart attack Father    Diabetes Father    Heart disease Father    Diabetes Brother    Diabetes Mother    Liver cancer Mother    Hypertension Other    Thyroid disease Other        sisters- sister had parathyroidectomy   Alcohol abuse Other        brother   Colon polyps Maternal Uncle    Breast cancer Sister    Diabetes Sister    Thyroid disease  Sister     Social History   Socioeconomic History   Marital status: Married    Spouse name: Not on file   Number of children: 3   Years of education: Not on file   Highest education level: Not on file  Occupational History   Occupation: housewife    Employer: UNEMPLOYED  Tobacco Use   Smoking status: Every Day    Packs/day: 1.00    Types: Cigarettes   Smokeless tobacco: Never  Vaping Use   Vaping Use: Former  Substance and Sexual Activity   Alcohol use: No   Drug use: No   Sexual activity: Not on file  Other Topics Concern   Not on file  Social History Narrative   t widowed    Regular exercise- yes not recently       Quit tobacco in August at heart surgery. Restarted    Husband diagnosed with liver cancer since 2014 passed April 2015    She was care taker.   No pets   Social Determinants of Health    Financial Resource Strain: Low Risk  (03/20/2020)   Overall Financial Resource Strain (CARDIA)    Difficulty of Paying Living Expenses: Not hard at all  Recent Concern: Financial Resource Strain - Medium Risk (01/30/2020)   Overall Financial Resource Strain (CARDIA)    Difficulty of Paying Living Expenses: Somewhat hard  Food Insecurity: No Food Insecurity (03/20/2020)   Hunger Vital Sign    Worried About Running Out of Food in the Last Year: Never true    Ran Out of Food in the Last Year: Never true  Transportation Needs: No Transportation Needs (03/20/2020)   PRAPARE - Hydrologist (Medical): No    Lack of Transportation (Non-Medical): No  Physical Activity: Insufficiently Active (03/20/2020)   Exercise Vital Sign    Days of Exercise per Week: 5 days    Minutes of Exercise per Session: 10 min  Stress: No Stress Concern Present (03/20/2020)   [place]    Feeling of Stress : Not at all  Social Connections: Socially Isolated (03/20/2020)   Social Connection and Isolation Panel [NHANES]    Frequency of Communication with Friends and Family: More than three times a week    Frequency of Social Gatherings with Friends and Family: Three times a week    Attends Religious Services: Never    Active Member of Clubs or Organizations: No    Attends Archivist Meetings: Never    Marital Status: Widowed    Outpatient Medications Prior to Visit  Medication Sig Dispense Refill   Accu-Chek FastClix Lancets MISC Use as instructed to check blood sugar 4 times daily. 150 each 2   ACCU-CHEK GUIDE test strip USE ACCU CHECK GUIDE TEST STRIPS AS INSTRUCTED TO CHECK BLOOD SUGAR 4 TIMES DAILY. 100 strip 4   albuterol (VENTOLIN HFA) 108 (90 Base) MCG/ACT inhaler INHALE 2 PUFFS INTO THE LUNGS EVERY 6 HOURS AS NEEDED FOR SHORTNESS OF BREATH OR WHEEZING 6.7 each 2   aspirin 325 MG EC tablet Take 325 mg by  mouth every morning.     atorvastatin (LIPITOR) 80 MG tablet TAKE 1 TABLET BY MOUTH EVERY DAY 90 tablet 2   Blood Glucose Monitoring Suppl (ACCU-CHEK GUIDE) w/Device KIT 1 each by Does not apply route 4 (four) times daily. Use Accu Chek to check blood sugar 4 times daily. 1 kit 0   ezetimibe (ZETIA) 10  MG tablet TAKE 1 TABLET BY MOUTH EVERY DAY 90 tablet 2   fenofibrate (TRICOR) 145 MG tablet TAKE 1 TABLET (145 MG TOTAL) BY MOUTH DAILY. STOP THE ZETIA 90 tablet 0   Insulin Pen Needle (BD PEN NEEDLE NANO U/F) 32G X 4 MM MISC Use as instructed to inject 3 insulin 3 times daily. 100 each 3   INVOKANA 300 MG TABS tablet TAKE 1 TABLET BY MOUTH EVERY DAY BEFORE BREAKFAST 90 tablet 1   irbesartan (AVAPRO) 300 MG tablet TAKE 1 TABLET BY MOUTH EVERY DAY 90 tablet 1   levothyroxine (SYNTHROID) 50 MCG tablet TAKE 1 TABLET BY MOUTH EVERY DAY 90 tablet 3   NOVOLOG FLEXPEN 100 UNIT/ML FlexPen INJECT 18 UNITS UNDER THE SKIN AT LARGEST MEAL AND 10 UNITS AT DINNER 3 mL 3   TRESIBA FLEXTOUCH 200 UNIT/ML FlexTouch Pen INJECT 42 UNITS INTO THE SKIN DAILY. 9 mL 2   No facility-administered medications prior to visit.     EXAM:  BP (!) 162/80 (BP Location: Left Arm, Cuff Size: Normal)   Pulse 87   Temp 97.9 F (36.6 C) (Oral)   Ht 5' 3.25" (1.607 m)   Wt 161 lb 9.6 oz (73.3 kg)   SpO2 95%   BMI 28.40 kg/m   Body mass index is 28.4 kg/m. Wt Readings from Last 3 Encounters:  03/04/22 161 lb 9.6 oz (73.3 kg)  02/21/22 162 lb 9.6 oz (73.8 kg)  11/15/21 165 lb (74.8 kg)    Physical Exam: Vital signs reviewed RFX:JOIT is a well-developed well-nourished alert cooperative    who appearsr stated age in no acute distress.  HEENT: normocephalic atraumatic , Eyes: PERRL EOM's full, conjunctiva clear, Nares: paten,t no deformity discharge or tenderness., Ears: no deformity EAC's wax left eac  TMs with normal landmarks. NECK: supple without masses, thyromegaly or bruits. CHEST/PULM:  Clear to auscultation and  percussion breath sounds equal no wheeze , rales or rhonchi. No chest M[place]in deformities or tenderness. Dec air movement  Breast: normal by inspection . No dimpling, discharge, masses, tenderness or discharge . CV: PMI is nondisplaced, S1 S2 no gallops, , rubs. Peripheral pulses are presemt .No JVD .  ABDOMEN: Bowel sounds normal nontender  No guard or rebound, no hepato splenomegal no CVA tenderness.   Extremtities:  No cyanosis or edema, no acute joint swelling or redness no focal atrophy? Early clubbing?  NEURO:  Oriented x3, cranial nerves 3-12 appear to be intact, no obvious focal weakness,gait within normal limits no abnormal reflexes or asymmetrical SKIN: No acute rashes normal turgor, color, no bruising or petechiae. PSYCH: Oriented, good eye contact, no obvious depression anxiety, cognition and judgment appear normal. LN: no cervical axillary inguinal adenopathy  Lab Results  Component Value Date   WBC 7.4 03/04/2022   HGB 15.4 (H) 03/04/2022   HCT 46.3 (H) 03/04/2022   PLT 163.0 03/04/2022   GLUCOSE 122 (H) 02/18/2022   CHOL 143 02/18/2022   TRIG 187.0 (H) 02/18/2022   HDL 24.80 (L) 02/18/2022   LDLDIRECT 77.0 08/13/2021   LDLCALC 80 02/18/2022   ALT 29 02/18/2022   AST 28 02/18/2022   NA 137 02/18/2022   K 4.5 02/18/2022   CL 101 02/18/2022   CREATININE 0.87 02/18/2022   BUN 21 02/18/2022   CO2 27 02/18/2022   TSH 3.64 02/18/2022   INR 1.06 02/01/2012   HGBA1C 7.3 (H) 02/18/2022   MICROALBUR 43.0 (H) 11/13/2021    BP Readings from Last 3 Encounters:  03/04/22 Marland Kitchen)  162/80  02/21/22 (!) 144/70  11/15/21 (!) 142/78    Labreviewed with patient  and plan   ASSESSMENT AND PLAN:  Discussed the following assessment and plan:    ICD-10-CM   1. Visit for preventive health examination  Z00.00     2. Influenza vaccine needed  Z23 Flu Vaccine QUAD High Dose(Fluad)    3. Polycythemia  D75.1 CBC with Differential/Platelet    CBC with Differential/Platelet    4.  Tobacco use  Z72.0 CBC with Differential/Platelet    CBC with Differential/Platelet    5. Essential hypertension  I10 CBC with Differential/Platelet    CBC with Differential/Platelet    6. Medication management  Z79.899 CBC with Differential/Platelet    CBC with Differential/Platelet    7. Hyperlipidemia associated with type 2 diabetes mellitus (HCC)  E11.69    E78.5     8. Diabetes mellitus with peripheral vascular disease (HCC)  E11.51     9. Mammogram declined  Z53.20     Up-to-date on lab monitoring except for CBC with elevated hemoglobin presumed secondary to tobacco pulmonary status, Discussed I am concerned about her continued tobacco use with these more matters. She is aware feels that she would smoke a lot less when she is with other people and is aware.  Tried other options in the past  Blood pressure is up today although she reports its been okay in the past I want her to send in blood pressure readings MyChart or call mailman if continued up to add medication probably amlodipine if she has not had it before with a problem. Vaccine discussions   Patient Care Team: Marene Gilliam, Standley Brooking, MD as PCP - General Cyndia Bent, Fernande Boyden, MD (Cardiothoracic Surgery) Early, Arvilla Meres, MD (Thoracic Diseases) [name], MD as Consulting Physician (Endocrinology) Viona Gilmore, [facility] as Pharmacist (Pharmacist) Patient Instructions  Good to see  you  Bp is up today     If indeed elevated we may need to add medication ( to the avapro)    Please send in some  home readings  Take blood pressure readings twice a day for 3-5  days and record .     Take 2 -3 readings at each sitting .   Can send in readings  by My Chart.   Or call  mail in     Before checking your blood pressure make sure: You are seated and quite for 5 min before checking Feet are flat on the floor Siting in chair with your back supported straight up and down Arm resting on table or arm of chair at heart level Bladder is  empty You have NOT had caffeine or tobacco within the last 30 min  [email or web address]  Stopping tobacco is the best other intervention for your health. Worried about your lungs aging and  eventually making you need more oxygen  and shortness of breath .  Let us know how we can help   Advise  RSV vaccine before end of October .   Standley Brooking. Lessie Manigo M.D.

## 2022-03-04 NOTE — Patient Instructions (Addendum)
Good to see  you  Bp is up today     If indeed elevated we may need to add medication ( to the avapro)    Please send in some  home readings  Take blood pressure readings twice a day for 3-5  days and record .     Take 2 -3 readings at each sitting .   Can send in readings  by My Chart.   Or call  mail in     Before checking your blood pressure make sure: You are seated and quite for 5 min before checking Feet are flat on the floor Siting in chair with your back supported straight up and down Arm resting on table or arm of chair at heart level Bladder is empty You have NOT had caffeine or tobacco within the last 30 min  [email or web address]  Stopping tobacco is the best other intervention for your health. Worried about your lungs aging and  eventually making you need more oxygen  and shortness of breath .  Let us know how we can help   Advise  RSV vaccine before end of October .

## 2022-03-08 NOTE — Progress Notes (Signed)
Hemoglobin results are slightly improved Please remember to send in blood pressure readings to ensure that your blood pressure is under control. Would like to see you in 4 to 6 months in the office.

## 2022-03-26 ENCOUNTER — Other Ambulatory Visit: Payer: Self-pay | Admitting: Endocrinology

## 2022-03-31 ENCOUNTER — Telehealth: Payer: Self-pay | Admitting: Pharmacist

## 2022-03-31 NOTE — Chronic Care Management (AMB) (Signed)
Chronic Care Management Pharmacy Assistant   Name: Kimberly [name]  MRN: [id number] DOB: 1950-10-06  Reason for Encounter: Disease State per Gap Report    Conditions to be addressed/monitored: HTN  Recent office visits:  03/04/22 Madelin Headings, MD - Patient presented for Preventative health exam and other concerns. No medication changes.  Recent consult visits:  02/21/22 Reather Littler, MD (Endo) - Patient presented for Uncontrolled type 2 diabetes mellitus with hyperglycemia with long term current use of insulin and other concerns. No medication changes.  11/15/21 Reather Littler, MD (Endo) - Patient presented for Uncontrolled type 2 diabetes mellitus with hyperglycemia with long term current use of insulin and other concerns. Prescribed Irbesartan 300 mg. Stopped Lisinopril 20 mg.  11/12/21 Sallye Lat (Ophthalmology) - Claims encounter for type 2 diabetes mellitus without complications and other concerns. No other visit details available.  Hospital visits:  None in previous 6 months  Medications: Outpatient Encounter Medications as of 03/31/2022  Medication Sig   Accu-Chek FastClix Lancets MISC Use as instructed to check blood sugar 4 times daily.   ACCU-CHEK GUIDE test strip USE ACCU CHECK GUIDE TEST STRIPS AS INSTRUCTED TO CHECK BLOOD SUGAR 4 TIMES DAILY.   albuterol (VENTOLIN HFA) 108 (90 Base) MCG/ACT inhaler INHALE 2 PUFFS INTO THE LUNGS EVERY 6 HOURS AS NEEDED FOR SHORTNESS OF BREATH OR WHEEZING   aspirin 325 MG EC tablet Take 325 mg by mouth every morning.   atorvastatin (LIPITOR) 80 MG tablet TAKE 1 TABLET BY MOUTH EVERY DAY   Blood Glucose Monitoring Suppl (ACCU-CHEK GUIDE) w/Device KIT 1 each by Does not apply route 4 (four) times daily. Use Accu Chek to check blood sugar 4 times daily.   ezetimibe (ZETIA) 10 MG tablet TAKE 1 TABLET BY MOUTH EVERY DAY   fenofibrate (TRICOR) 145 MG tablet TAKE 1 TABLET (145 MG TOTAL) BY MOUTH DAILY. STOP THE ZETIA   Insulin Pen Needle (BD  PEN NEEDLE NANO U/F) 32G X 4 MM MISC Use as instructed to inject 3 insulin 3 times daily.   INVOKANA 300 MG TABS tablet TAKE 1 TABLET BY MOUTH EVERY DAY BEFORE BREAKFAST   irbesartan (AVAPRO) 300 MG tablet TAKE 1 TABLET BY MOUTH EVERY DAY   levothyroxine (SYNTHROID) 50 MCG tablet TAKE 1 TABLET BY MOUTH EVERY DAY   NOVOLOG FLEXPEN 100 UNIT/ML FlexPen INJECT 18 UNITS UNDER THE SKIN AT LARGEST MEAL AND 10 UNITS AT DINNER   TRESIBA FLEXTOUCH 200 UNIT/ML FlexTouch Pen INJECT 42 UNITS INTO THE SKIN DAILY.   No facility-administered encounter medications on file as of 03/31/2022.   Reviewed chart prior to disease state call. Spoke with patient regarding BP  Recent Office Vitals: BP Readings from Last 3 Encounters:  03/04/22 (!) 162/80  02/21/22 (!) 144/70  11/15/21 (!) 142/78   Pulse Readings from Last 3 Encounters:  03/04/22 87  02/21/22 83  11/15/21 62    Wt Readings from Last 3 Encounters:  03/04/22 161 lb 9.6 oz (73.3 kg)  02/21/22 162 lb 9.6 oz (73.8 kg)  11/15/21 165 lb (74.8 kg)     Kidney Function Lab Results  Component Value Date/Time   CREATININE 0.87 02/18/2022 08:06 AM   CREATININE 0.94 11/13/2021 08:07 AM   GFR 67.63 02/18/2022 08:06 AM   GFRNONAA 76 (L) 02/01/2012 06:30 AM   GFRAA 88 (L) 02/01/2012 06:30 AM       Latest Ref Rng & Units 02/18/2022    8:06 AM 11/13/2021    8:07 AM 08/13/2021  8:14 AM  BMP  Glucose 70 - 99 mg/dL 122  136  131   BUN 6 - 23 mg/dL $Remove'21  24  27   'AwqZcVU$ Creatinine 0.40 - 1.20 mg/dL 0.87  0.94  0.81   Sodium 135 - 145 mEq/L 137  137  137   Potassium 3.5 - 5.1 mEq/L 4.5  5.0  4.2   Chloride 96 - 112 mEq/L 101  101  101   CO2 19 - 32 mEq/L $Remove'27  26  26   'pNkEmdg$ Calcium 8.4 - 10.5 mg/dL 10.3  10.6  10.0     Current antihypertensive regimen:   Irbesartan 300 mg.Take 1 Tab daily  How often are you checking your Blood Pressure? infrequently Current home BP readings: Patient reports she has not been checking as provider suggested, she reports compliance  in the use of her medications and denies any hyper/hypotensive symptoms. She was with her daughter who had a machine and attempted to take pressures, patient reported she was not seated and wanted to take while standing reading was 151/84 advised her of importance of proper technique that she should be seated at rest feet on floor and arm at heart level on table or desk, she reports she had not to long ago had a cigarette and eaten lunch, she was talking also during the second reading it was 161/80. She advised she would try the above recommendations and take for the remainder of the week one in AM/PM and mail the readings to the office. Address Given to pt to do so. What recent interventions/DTPs have been made by any provider to improve Blood Pressure control since last CPP Visit: Stopped Lisinopril 20 mg by Endo in June. Any recent hospitalizations or ED visits since last visit with CPP? No What diet changes have been made to improve Blood Pressure Control?  Patient reports none.  Adherence Review: Is the patient currently on ACE/ARB medication? Yes Does the patient have >5 day gap between last estimated fill dates? No    Care Gaps: Zoster Vaccine - Overdue Colonoscopy - Overdue TDAP - Overdue COVID Booster - Overdue CCM- [facility] PT AWV-2021 office aware to schedule as of 10 /22 Lab Results  Component Value Date   HGBA1C 7.3 (H) 02/18/2022    Star Rating Drugs: Atorvastatin (Lipitor) 80 mg - Last filled 01/28/22 90 [facility] Irbesartan 300 mg - Last filled 02/13/22 90 [facility] at [facility] Canagliflozin (Invokana) 300 mg - Last filled 02/23/22 90 DS at [facility] Pharmacist Assistant [phone]

## 2022-05-05 ENCOUNTER — Other Ambulatory Visit: Payer: Self-pay | Admitting: Endocrinology

## 2022-05-06 ENCOUNTER — Other Ambulatory Visit: Payer: Self-pay

## 2022-05-06 MED ORDER — NOVOLOG FLEXPEN 100 UNIT/ML ~~LOC~~ SOPN: PEN_INJECTOR | SUBCUTANEOUS | 1 refills | Status: DC

## 2022-05-08 ENCOUNTER — Other Ambulatory Visit: Payer: Self-pay | Admitting: Internal Medicine

## 2022-06-11 ENCOUNTER — Other Ambulatory Visit: Payer: Self-pay | Admitting: Endocrinology

## 2022-06-20 ENCOUNTER — Other Ambulatory Visit (INDEPENDENT_AMBULATORY_CARE_PROVIDER_SITE_OTHER): Payer: Medicare HMO

## 2022-06-20 DIAGNOSIS — E1165 Type 2 diabetes mellitus with hyperglycemia: Secondary | ICD-10-CM

## 2022-06-20 DIAGNOSIS — E063 Autoimmune thyroiditis: Secondary | ICD-10-CM

## 2022-06-20 DIAGNOSIS — Z794 Long term (current) use of insulin: Secondary | ICD-10-CM | POA: Diagnosis not present

## 2022-06-20 LAB — MICROALBUMIN / CREATININE URINE RATIO
Creatinine,U: 64.8 mg/dL
Microalb Creat Ratio: 62 mg/g — ABNORMAL HIGH (ref 0.0–30.0)
Microalb, Ur: 40.2 mg/dL — ABNORMAL HIGH (ref 0.0–1.9)

## 2022-06-20 LAB — COMPREHENSIVE METABOLIC PANEL
ALT: 26 U/L (ref 0–35)
AST: 26 U/L (ref 0–37)
Albumin: 4.4 g/dL (ref 3.5–5.2)
Alkaline Phosphatase: 70 U/L (ref 39–117)
BUN: 25 mg/dL — ABNORMAL HIGH (ref 6–23)
CO2: 28 mEq/L (ref 19–32)
Calcium: 10.3 mg/dL (ref 8.4–10.5)
Chloride: 104 mEq/L (ref 96–112)
Creatinine, Ser: 0.84 mg/dL (ref 0.40–1.20)
GFR: 70.37 mL/min (ref >60.00)
Glucose, Bld: 148 mg/dL — ABNORMAL HIGH (ref 70–99)
Potassium: 4.8 mEq/L (ref 3.5–5.1)
Sodium: 139 mEq/L (ref 135–145)
Total Bilirubin: 0.5 mg/dL (ref 0.2–1.2)
Total Protein: 7.3 g/dL (ref 6.0–8.3)

## 2022-06-20 LAB — HEMOGLOBIN A1C: Hgb A1c MFr Bld: 7.7 % — ABNORMAL HIGH (ref 4.6–6.5)

## 2022-06-20 LAB — TSH: TSH: 3.65 u[IU]/mL (ref 0.35–5.50)

## 2022-06-24 ENCOUNTER — Encounter: Payer: Self-pay | Admitting: Endocrinology

## 2022-06-24 ENCOUNTER — Ambulatory Visit: Payer: Medicare HMO | Admitting: Endocrinology

## 2022-06-24 VITALS — BP 142/78 | HR 81 | Ht 64.0 in | Wt 164.0 lb

## 2022-06-24 DIAGNOSIS — R809 Proteinuria, unspecified: Secondary | ICD-10-CM | POA: Diagnosis not present

## 2022-06-24 DIAGNOSIS — I10 Essential (primary) hypertension: Secondary | ICD-10-CM

## 2022-06-24 DIAGNOSIS — E1129 Type 2 diabetes mellitus with other diabetic kidney complication: Secondary | ICD-10-CM

## 2022-06-24 DIAGNOSIS — Z794 Long term (current) use of insulin: Secondary | ICD-10-CM | POA: Diagnosis not present

## 2022-06-24 DIAGNOSIS — E1165 Type 2 diabetes mellitus with hyperglycemia: Secondary | ICD-10-CM | POA: Diagnosis not present

## 2022-06-24 NOTE — Patient Instructions (Addendum)
Exercise to music  Check blood sugars on waking up 3 days a week  Also check blood sugars about 2 hours after meals and do this after different meals by rotation  Recommended blood sugar levels on waking up are 90-130 and about 2 hours after meal is 130-160  Please bring your blood sugar monitor to each visit, thank you  BP weekly

## 2022-06-24 NOTE — Progress Notes (Signed)
Patient ID: Kimberly [name], female   DOB: 22-May-1951, 72 y.o.   MRN: [id number]           Reason for Appointment: Endocrinology follow-up    History of Present Illness:          Date of diagnosis of type 2 diabetes mellitus: 1995        Background history:   For several years she had been tried on oral agents with variable control.  Detailed history not available She thinks that she had nausea and vomiting with metformin and could not take much Also had been tried on Amaryl  She thinks she had swelling with Januvia and not clear what other medications she has tried before  She was started on insulin in 07/2013 when her A1c was over 11% Because of her poor control and A1c of 9.1 she was started on Invokana 100 mg daily in 12/16  Recent history:   INSULIN regimen is: Tresiba U-200, 42 hs   Novolog 18 units before lunch. Novolog 10 acs    Non-insulin hypoglycemic drugs the patient is taking are:  Invokana 300 mg daily  Her A1c is about the same at 7.2, was 7.3  A1c previously has been as low as 6.7   Current blood sugar patterns, management and problems identified: She did bring her monitor but were unable to download   She has been reluctant to consider the freestyle libre system and this has discussed again She thinks her blood sugars in the last month or 2 may have been as high as 236 but does not remember the exact numbers However her 30-day average is still only 136  She thinks her diet has been good but not consistently in the last 6 or 8 weeks Has checked blood sugars consistently after meals and has only 1 recent high reading of 187 Blood sugars in the mornings are slightly variable Although she checks blood sugars at 5 AM at home her lab glucose was 148 at about 8 AM fasting No hypoglycemia reported She does not like to exercise because she is afraid of going out with risk of respiratory infections      Side effects from medications have been: Nausea from metformin,?   Swelling from Januvia  Glucose monitoring:  done 1-2 times a day         Glucometer:  Accu-Chek       Blood Glucose readings from monitor    PRE-MEAL Fasting Lunch Dinner Bedtime Overall  Glucose range: 112-130      Mean/median:     136   POST-MEAL PC Breakfast PC Lunch PC Dinner  Glucose range:  98-187 92-158  Mean/median:      Previously: ' PRE-MEAL Fasting Lunch Dinner Bedtime Overall  Glucose range: 111-134      Mean/median:     122   POST-MEAL PC Breakfast PC Lunch PC Dinner  Glucose range:  92-218 129, 179  Mean/median:        Self-care: The diet that the patient has been following is: tries to limit fats, breads.     Meal times are:  Lunch: 12 pm Dinner: 5-7 PM   Typical meal intake: Breakfast is minimal          Dietician visit, most recent: 09/2015                Weight history:  Wt Readings from Last 3 Encounters:  06/24/22 164 lb (74.4 kg)  03/04/22 161 lb 9.6 oz (73.3  kg)  02/21/22 162 lb 9.6 oz (73.8 kg)    Glycemic control:   Lab Results  Component Value Date   HGBA1C 7.7 (H) 06/20/2022   HGBA1C 7.3 (H) 02/18/2022   HGBA1C 7.2 (H) 11/13/2021   Lab Results  Component Value Date   MICROALBUR 40.2 (H) 06/20/2022   LDLCALC 80 02/18/2022   CREATININE 0.84 06/20/2022    Other active problems discussed today: See review of systems   Allergies as of 06/24/2022       Reactions   Metformin And Related Nausea And Vomiting   After one pill at hospital   Januvia [sitagliptin Phosphate] Other (See Comments)   Swelling and edema see 4 13 OV        Medication List        Accurate as of June 24, 2022  3:29 PM. If you have any questions, ask your nurse or doctor.          Accu-Chek FastClix Lancets Misc Use as instructed to check blood sugar 4 times daily.   Accu-Chek Guide test strip Generic drug: glucose blood USE ACCU CHECK GUIDE TEST STRIPS AS INSTRUCTED TO CHECK BLOOD SUGAR 4 TIMES DAILY.   Accu-Chek Guide w/Device Kit 1 each by  Does not apply route 4 (four) times daily. Use Accu Chek to check blood sugar 4 times daily.   albuterol 108 (90 Base) MCG/ACT inhaler Commonly known as: VENTOLIN HFA INHALE 2 PUFFS INTO THE LUNGS EVERY 6 HOURS AS NEEDED FOR SHORTNESS OF BREATH OR WHEEZING   aspirin EC 325 MG tablet Take 325 mg by mouth every morning.   atorvastatin 80 MG tablet Commonly known as: LIPITOR TAKE 1 TABLET BY MOUTH EVERY DAY   BD Pen Needle Nano U/F 32G X 4 MM Misc Generic drug: Insulin Pen Needle Use as instructed to inject 3 insulin 3 times daily.   ezetimibe 10 MG tablet Commonly known as: ZETIA TAKE 1 TABLET BY MOUTH EVERY DAY   fenofibrate 145 MG tablet Commonly known as: TRICOR TAKE 1 TABLET (145 MG TOTAL) BY MOUTH DAILY. STOP THE ZETIA   Invokana 300 MG Tabs tablet Generic drug: canagliflozin TAKE 1 TABLET BY MOUTH EVERY DAY BEFORE BREAKFAST   irbesartan 300 MG tablet Commonly known as: AVAPRO TAKE 1 TABLET BY MOUTH EVERY DAY   levothyroxine 50 MCG tablet Commonly known as: SYNTHROID TAKE 1 TABLET BY MOUTH EVERY DAY   NovoLOG FlexPen 100 UNIT/ML FlexPen Generic drug: insulin aspart INJECT 18 UNITS UNDER THE SKIN AT LARGEST MEAL AND 10 UNITS AT Wonda Cheng   Tyler Aas FlexTouch 200 UNIT/ML FlexTouch Pen Generic drug: insulin degludec INJECT 42 UNITS INTO THE SKIN DAILY.        Allergies:  Allergies  Allergen Reactions   Metformin And Related Nausea And Vomiting    After one pill at hospital   Januvia [Sitagliptin Phosphate] Other (See Comments)    Swelling and edema see 4 13 OV    Past Medical History:  Diagnosis Date   CAD (coronary artery disease)    Colitis    Colitis, indeterminate 01/31/2012   COPD (chronic obstructive pulmonary disease) ([facility])    Depression 05/16/2017   Depression with anxiety 05/30/2016   Diverticulitis 02/10/2012   DM (diabetes mellitus) ([place])    Glaucoma    Glaucoma    History of ventricular fibrillation    HTN (hypertension)     Hyperlipidemia    Myocardial infarction ([place])    12/1998, 01/2011   Osteoporosis    PVD (  peripheral vascular disease) [facility])     Past Surgical History:  Procedure Laterality Date   ABDOMINAL HYSTERECTOMY     endometriosis   CABG x 3  01/22/2011   Dr Cyndia Bent   CARDIAC CATHETERIZATION  01/17/2011   Dr Arvella Merles   LIPOMA EXCISION Left 02/08/2015   Procedure: EXCISION OF LEFT THIGH LIPOMA;  Surgeon: Greer Pickerel, MD;  Location: [facility];  Service: General;  Laterality: Left;   S/P aortobifemoral bypass  2008   Dr Donnetta Hutching   S/P myocardial infarction and PCI of the lt circumflex  2000    Family History  Problem Relation Age of Onset   Heart attack Father    Diabetes Father    Heart disease Father    Diabetes Brother    Diabetes Mother    Liver cancer Mother    Hypertension Other    Thyroid disease Other        sisters- sister had parathyroidectomy   Alcohol abuse Other        brother   Colon polyps Maternal Uncle    Breast cancer Sister    Diabetes Sister    Thyroid disease Sister     Social History:  reports that she has been smoking cigarettes. She has been smoking an average of 1 pack per day. She has never used smokeless tobacco. She reports that she does not drink alcohol and does not use drugs.    Review of Systems    Lipid history: On 80 mg Lipitor  with history of CAD Prescribed by PCP Also on fenofibrate and Zetia   Lab Results  Component Value Date   CHOL 143 02/18/2022   CHOL 132 08/13/2021   CHOL 173 05/02/2021   Lab Results  Component Value Date   HDL 24.80 (L) 02/18/2022   HDL 25.60 (L) 08/13/2021   HDL 30.90 (L) 05/02/2021   Lab Results  Component Value Date   LDLCALC 80 02/18/2022   LDLCALC 105 (H) 05/02/2021   LDLCALC 51 01/31/2020   Lab Results  Component Value Date   TRIG 187.0 (H) 02/18/2022   TRIG 214.0 (H) 08/13/2021   TRIG 185.0 (H) 05/02/2021   Lab Results  Component Value Date   CHOLHDL 6 02/18/2022   CHOLHDL 5  08/13/2021   CHOLHDL 6 05/02/2021   Lab Results  Component Value Date   LDLDIRECT 77.0 08/13/2021   LDLDIRECT 66.0 02/26/2021   LDLDIRECT 65.0 12/26/2020            Hypertension: Present for several years  Blood pressure at home ?   She does have microalbuminuria  BP Readings from Last 3 Encounters:  06/24/22 (!) 142/78  03/04/22 (!) 162/80  02/21/22 (!) 144/70    HYPERCALCEMIA: She has had periodic mild hypercalcemia since about 2010; PTH levels in the mid-upper range Calcium is generally upper normal and stable  Lab Results  Component Value Date   CALCIUM 10.3 06/20/2022    She is taking 1000 units of vitamin D with adequate vitamin D levels  OSTEOPENIA:  No history of fractures  Her last bone density was in 4/22  Results:   Lumbar spine L1-L4(L3) Femoral neck (FN)  T-score 0.2 RFN: -1.0 LFN: -1.3  Change in BMD from previous DXA test (%) Down 0.6% Down 2.0%    She was tried on Evista but she says it was causing her hot flashes and night sweats and she stopped taking this  Previously in 07/2015 T score -2.3 was at the  wrist, this was not measured in 2019  Most recent eye exam was normal in 10/2018 and she does go every year  Most recent foot exam: 11/22   HYPOTHYROIDISM:  She has a family history of hypothyroidism, Graves' disease and thyroid nodules  On her visit in 11/20 she was complaining of feeling more tired as well as colder; had some weight gain also.   Baseline TSH was 6  With levothyroxine 25 mcg daily she did not feel any better and with TSH still 4.5 she was told to go up to 50 mcg in 07/2019  No recent fatigue Has taken her levothyroxine consistently before breakfast daily  Her TSH is consistently normal  Lab Results  Component Value Date   TSH 3.65 06/20/2022   TSH 3.64 02/18/2022   TSH 3.30 08/13/2021   FREET4 0.97 12/26/2020   FREET4 1.11 01/31/2020   FREET4 1.23 10/24/2019     Review of Systems    LABS:  Lab on  06/20/2022  Component Date Value Ref Range Status   TSH 06/20/2022 3.65  0.35 - 5.50 uIU/mL Final   Microalb, Ur 06/20/2022 40.2 (H)  0.0 - 1.9 mg/dL Final   Creatinine,U 06/20/2022 64.8  mg/dL Final   Microalb Creat Ratio 06/20/2022 62.0 (H)  0.0 - 30.0 mg/g Final   Sodium 06/20/2022 139  135 - 145 mEq/L Final   Potassium 06/20/2022 4.8  3.5 - 5.1 mEq/L Final   Chloride 06/20/2022 104  96 - 112 mEq/L Final   CO2 06/20/2022 28  19 - 32 mEq/L Final   Glucose, Bld 06/20/2022 148 (H)  70 - 99 mg/dL Final   BUN 06/20/2022 25 (H)  6 - 23 mg/dL Final   Creatinine, Ser 06/20/2022 0.84  0.40 - 1.20 mg/dL Final   Total Bilirubin 06/20/2022 0.5  0.2 - 1.2 mg/dL Final   Alkaline Phosphatase 06/20/2022 70  39 - 117 U/L Final   AST 06/20/2022 26  0 - 37 U/L Final   ALT 06/20/2022 26  0 - 35 U/L Final   Total Protein 06/20/2022 7.3  6.0 - 8.3 g/dL Final   Albumin 06/20/2022 4.4  3.5 - 5.2 g/dL Final   GFR 06/20/2022 70.37  >60.00 mL/min Final   Calculated using the CKD-EPI Creatinine Equation (2021)   Calcium 06/20/2022 10.3  8.4 - 10.5 mg/dL Final   Hgb A1c MFr Bld 06/20/2022 7.7 (H)  4.6 - 6.5 % Final   Glycemic Control Guidelines for People with Diabetes:Non Diabetic:  <6%Goal of Therapy: <7%Additional Action Suggested:  >8%     Physical Examination:  BP (!) 142/78   Pulse 81   Ht '5\' 4"'$  (1.626 m)   Wt 164 lb (74.4 kg)   SpO2 96%   BMI 28.15 kg/m     ASSESSMENT:  Diabetes type 2, mildly obese  See history of present illness for detailed discussion of his current management, blood sugar patterns and problems identified  A1c is 7.7  She is on basal bolus insulin with Invokana 300 mg  Blood sugars are slightly higher by A1c but recent home readings looking good Can also do better with exercise Again refuses to consider CGM  MICROALBUMINURIA: Improved but still not back to normal Already on 300 mg of Avapro  HYPOTHYROIDISM: Again TSH is normal with 50 mcg  levothyroxine  Hypertension: Blood pressure is upper normal as before Does need to start rechecking her blood pressure at home regularly and let us know if it is consistently high  PLAN:    Start regular exercise, she can do home exercises such as with music since she does not have a DVD player or laptop Continue to adjust mealtime doses up or down 2 to 3 units based on meal size or carbohydrates and also may reduce suppertime dose by 2 units when eating lighter meals No change in basal insulin Continue Invokana  Follow-up in 3 months    Patient Instructions  Exercise to music  Check blood sugars on waking up 3 days a week  Also check blood sugars about 2 hours after meals and do this after different meals by rotation  Recommended blood sugar levels on waking up are 90-130 and about 2 hours after meal is 130-160  Please bring your blood sugar monitor to each visit, thank you  BP weekly   [name] 06/24/2022, 3:29 PM   Note: This office note was prepared with Dragon voice recognition system technology. Any transcriptional errors that result from this process are unintentional.

## 2022-07-24 ENCOUNTER — Other Ambulatory Visit: Payer: Self-pay | Admitting: Endocrinology

## 2022-07-25 ENCOUNTER — Other Ambulatory Visit: Payer: Self-pay | Admitting: Internal Medicine

## 2022-08-03 ENCOUNTER — Other Ambulatory Visit: Payer: Self-pay | Admitting: Internal Medicine

## 2022-08-04 ENCOUNTER — Other Ambulatory Visit: Payer: Self-pay | Admitting: Endocrinology

## 2022-09-01 NOTE — Progress Notes (Unsigned)
No chief complaint on file.   HPI: Kimberly [name] 72 y.o. come in for Chronic disease management   HLD  lipitor  zetia  tricor Bp obesartan  Dm oer dr Raliegh Ip invokanna insulin  THyroid per dr Isidore Moos  still tobacco  inhaler as needed  ROS: See pertinent positives and negatives per HPI.  Past Medical History:  Diagnosis Date   CAD (coronary artery disease)    Colitis    Colitis, indeterminate 01/31/2012   COPD (chronic obstructive pulmonary disease) ([place])    Depression 05/16/2017   Depression with anxiety 05/30/2016   Diverticulitis 02/10/2012   DM (diabetes mellitus) ([facility])    Glaucoma    Glaucoma    History of ventricular fibrillation    HTN (hypertension)    Hyperlipidemia    Myocardial infarction ([place])    12/1998, 01/2011   Osteoporosis    PVD (peripheral vascular disease) ([place])     Family History  Problem Relation Age of Onset   Heart attack Father    Diabetes Father    Heart disease Father    Diabetes Brother    Diabetes Mother    Liver cancer Mother    Hypertension Other    Thyroid disease Other        sisters- sister had parathyroidectomy   Alcohol abuse Other        brother   Colon polyps Maternal Uncle    Breast cancer Sister    Diabetes Sister    Thyroid disease Sister     Social History   Socioeconomic History   Marital status: Married    Spouse name: Not on file   Number of children: 3   Years of education: Not on file   Highest education level: Not on file  Occupational History   Occupation: housewife    Employer: UNEMPLOYED  Tobacco Use   Smoking status: Every Day    Packs/day: 1    Types: Cigarettes   Smokeless tobacco: Never  Vaping Use   Vaping Use: Former  Substance and Sexual Activity   Alcohol use: No   Drug use: No   Sexual activity: Not on file  Other Topics Concern   Not on file  Social History Narrative   t widowed    Regular exercise- yes not recently       Quit tobacco in August at heart surgery. Restarted     Husband diagnosed with liver cancer since 2014 passed April 2015    She was care taker.   No pets   Social Determinants of Health   Financial Resource Strain: Low Risk  (03/20/2020)   Overall Financial Resource Strain (CARDIA)    Difficulty of Paying Living Expenses: Not hard at all  Recent Concern: Financial Resource Strain - Medium Risk (01/30/2020)   Overall Financial Resource Strain (CARDIA)    Difficulty of Paying Living Expenses: Somewhat hard  Food Insecurity: No Food Insecurity (03/20/2020)   Hunger Vital Sign    Worried About Running Out of Food in the Last Year: Never true    Ran Out of Food in the Last Year: Never true  Transportation Needs: No Transportation Needs (03/20/2020)   PRAPARE - Hydrologist (Medical): No    Lack of Transportation (Non-Medical): No  Physical Activity: Insufficiently Active (03/20/2020)   Exercise Vital Sign    Days of Exercise per Week: 5 days    Minutes of Exercise per Session: 10 min  Stress: No Stress  Concern Present (03/20/2020)   [place]    Feeling of Stress : Not at all  Social Connections: Socially Isolated (03/20/2020)   Social Connection and Isolation Panel [NHANES]    Frequency of Communication with Friends and Family: More than three times a week    Frequency of Social Gatherings with Friends and Family: Three times a week    Attends Religious Services: Never    Active Member of Clubs or Organizations: No    Attends Archivist Meetings: Never    Marital Status: Widowed    Outpatient Medications Prior to Visit  Medication Sig Dispense Refill   Accu-Chek FastClix Lancets MISC Use as instructed to check blood sugar 4 times daily. 150 each 2   ACCU-CHEK GUIDE test strip USE ACCU CHECK GUIDE TEST STRIPS AS INSTRUCTED TO CHECK BLOOD SUGAR 4 TIMES DAILY. 100 strip 4   albuterol (VENTOLIN HFA) 108 (90 Base) MCG/ACT inhaler INHALE 2  PUFFS INTO THE LUNGS EVERY 6 HOURS AS NEEDED FOR SHORTNESS OF BREATH OR WHEEZING 6.7 each 2   aspirin 325 MG EC tablet Take 325 mg by mouth every morning.     atorvastatin (LIPITOR) 80 MG tablet TAKE 1 TABLET BY MOUTH EVERY DAY 90 tablet 2   Blood Glucose Monitoring Suppl (ACCU-CHEK GUIDE) w/Device KIT 1 each by Does not apply route 4 (four) times daily. Use Accu Chek to check blood sugar 4 times daily. 1 kit 0   ezetimibe (ZETIA) 10 MG tablet TAKE 1 TABLET BY MOUTH EVERY DAY 90 tablet 2   fenofibrate (TRICOR) 145 MG tablet TAKE 1 TABLET (145 MG TOTAL) BY MOUTH DAILY. STOP THE ZETIA 90 tablet 0   insulin aspart (NOVOLOG FLEXPEN) 100 UNIT/ML FlexPen INJECT 18 UNITS UNDER THE SKIN AT LARGEST MEAL AND 10 UNITS AT DINNER 30 mL 1   insulin degludec (TRESIBA FLEXTOUCH) 200 UNIT/ML FlexTouch Pen INJECT 42 UNITS INTO THE SKIN DAILY. 9 mL 2   Insulin Pen Needle (BD PEN NEEDLE NANO U/F) 32G X 4 MM MISC Use as instructed to inject 3 insulin 3 times daily. 100 each 3   INVOKANA 300 MG TABS tablet TAKE 1 TABLET BY MOUTH EVERY DAY BEFORE BREAKFAST 90 tablet 1   irbesartan (AVAPRO) 300 MG tablet TAKE 1 TABLET BY MOUTH EVERY DAY 90 tablet 1   levothyroxine (SYNTHROID) 50 MCG tablet TAKE 1 TABLET BY MOUTH EVERY DAY 90 tablet 3   No facility-administered medications prior to visit.     EXAM:  There were no vitals taken for this visit.  There is no height or weight on file to calculate BMI.  GENERAL: vitals reviewed and listed above, alert, oriented, appears well hydrated and in no acute distress HEENT: atraumatic, conjunctiva  clear, no obvious abnormalities on inspection of external nose and ears OP : no lesion edema or exudate  NECK: no obvious masses on inspection palpation  LUNGS: clear to auscultation bilaterally, no wheezes, rales or rhonchi, good air movement CV: HRRR, no clubbing cyanosis or  peripheral edema nl cap refill  MS: moves all extremities without noticeable focal  abnormality PSYCH:  pleasant and cooperative, no obvious depression or anxiety Lab Results  Component Value Date   WBC 7.4 03/04/2022   HGB 15.4 (H) 03/04/2022   HCT 46.3 (H) 03/04/2022   PLT 163.0 03/04/2022   GLUCOSE 148 (H) 06/20/2022   CHOL 143 02/18/2022   TRIG 187.0 (H) 02/18/2022   HDL 24.80 (L) 02/18/2022  LDLDIRECT 77.0 08/13/2021   LDLCALC 80 02/18/2022   ALT 26 06/20/2022   AST 26 06/20/2022   NA 139 06/20/2022   K 4.8 06/20/2022   CL 104 06/20/2022   CREATININE 0.84 06/20/2022   BUN 25 (H) 06/20/2022   CO2 28 06/20/2022   TSH 3.65 06/20/2022   INR 1.06 02/01/2012   HGBA1C 7.7 (H) 06/20/2022   MICROALBUR 40.2 (H) 06/20/2022   BP Readings from Last 3 Encounters:  06/24/22 (!) 142/78  03/04/22 (!) 162/80  02/21/22 (!) 144/70    ASSESSMENT AND PLAN:  Discussed the following assessment and plan:  Essential hypertension  PERIPHERAL VASCULAR DISEASE  TOBACCO USER  Medication management  Diabetes mellitus with peripheral vascular disease ([place]) Lipids  cbc  -Patient advised to return or notify health care team  if  new concerns arise.  There are no Patient Instructions on file for this visit.   Standley Brooking. Devina Bezold M.D.

## 2022-09-02 ENCOUNTER — Encounter: Payer: Self-pay | Admitting: Internal Medicine

## 2022-09-02 ENCOUNTER — Ambulatory Visit (INDEPENDENT_AMBULATORY_CARE_PROVIDER_SITE_OTHER): Payer: Medicare HMO | Admitting: Internal Medicine

## 2022-09-02 VITALS — BP 160/72 | HR 88 | Temp 98.0°F | Ht 64.0 in | Wt 160.4 lb

## 2022-09-02 DIAGNOSIS — E1151 Type 2 diabetes mellitus with diabetic peripheral angiopathy without gangrene: Secondary | ICD-10-CM

## 2022-09-02 DIAGNOSIS — J301 Allergic rhinitis due to pollen: Secondary | ICD-10-CM | POA: Diagnosis not present

## 2022-09-02 DIAGNOSIS — E785 Hyperlipidemia, unspecified: Secondary | ICD-10-CM

## 2022-09-02 DIAGNOSIS — J449 Chronic obstructive pulmonary disease, unspecified: Secondary | ICD-10-CM | POA: Diagnosis not present

## 2022-09-02 DIAGNOSIS — I739 Peripheral vascular disease, unspecified: Secondary | ICD-10-CM | POA: Diagnosis not present

## 2022-09-02 DIAGNOSIS — E1169 Type 2 diabetes mellitus with other specified complication: Secondary | ICD-10-CM | POA: Diagnosis not present

## 2022-09-02 DIAGNOSIS — F17200 Nicotine dependence, unspecified, uncomplicated: Secondary | ICD-10-CM

## 2022-09-02 DIAGNOSIS — I10 Essential (primary) hypertension: Secondary | ICD-10-CM | POA: Diagnosis not present

## 2022-09-02 DIAGNOSIS — Z79899 Other long term (current) drug therapy: Secondary | ICD-10-CM

## 2022-09-02 MED ORDER — ALBUTEROL SULFATE HFA 108 (90 BASE) MCG/ACT IN AERS: INHALATION_SPRAY | RESPIRATORY_TRACT | 2 refills | Status: AC

## 2022-09-02 NOTE — Patient Instructions (Addendum)
Will refill albuterol today .  For allergies . Can try otc loratadine( generic Claritin ) antihistamine  daily or as needed. Also  generic nasal cortisone .   2 sprays each nostril each day in allergy season can suppress prevent allergy flare.   (FLonase nasacort  otc)  Wear a mask when mowing.   Will have Dr Dwyane Dee do monitoring lab work . Otherwise  6 months CPE

## 2022-09-29 ENCOUNTER — Other Ambulatory Visit (INDEPENDENT_AMBULATORY_CARE_PROVIDER_SITE_OTHER): Payer: Medicare HMO

## 2022-09-29 DIAGNOSIS — Z794 Long term (current) use of insulin: Secondary | ICD-10-CM | POA: Diagnosis not present

## 2022-09-29 DIAGNOSIS — E1165 Type 2 diabetes mellitus with hyperglycemia: Secondary | ICD-10-CM

## 2022-09-29 LAB — HEMOGLOBIN A1C: Hgb A1c MFr Bld: 7.5 % — ABNORMAL HIGH (ref 4.6–6.5)

## 2022-09-29 LAB — BASIC METABOLIC PANEL
BUN: 21 mg/dL (ref 6–23)
CO2: 26 mEq/L (ref 19–32)
Calcium: 10 mg/dL (ref 8.4–10.5)
Chloride: 102 mEq/L (ref 96–112)
Creatinine, Ser: 0.96 mg/dL (ref 0.40–1.20)
GFR: 59.83 mL/min — ABNORMAL LOW (ref >60.00)
Glucose, Bld: 105 mg/dL — ABNORMAL HIGH (ref 70–99)
Potassium: 4.6 mEq/L (ref 3.5–5.1)
Sodium: 137 mEq/L (ref 135–145)

## 2022-10-02 ENCOUNTER — Encounter: Payer: Self-pay | Admitting: Endocrinology

## 2022-10-02 ENCOUNTER — Ambulatory Visit: Payer: Medicare HMO | Admitting: Endocrinology

## 2022-10-02 VITALS — BP 122/84 | HR 81 | Ht 64.0 in | Wt 163.6 lb

## 2022-10-02 DIAGNOSIS — I10 Essential (primary) hypertension: Secondary | ICD-10-CM | POA: Diagnosis not present

## 2022-10-02 DIAGNOSIS — R809 Proteinuria, unspecified: Secondary | ICD-10-CM | POA: Diagnosis not present

## 2022-10-02 DIAGNOSIS — Z794 Long term (current) use of insulin: Secondary | ICD-10-CM | POA: Diagnosis not present

## 2022-10-02 DIAGNOSIS — E1129 Type 2 diabetes mellitus with other diabetic kidney complication: Secondary | ICD-10-CM | POA: Diagnosis not present

## 2022-10-02 DIAGNOSIS — E1165 Type 2 diabetes mellitus with hyperglycemia: Secondary | ICD-10-CM

## 2022-10-02 MED ORDER — ACCU-CHEK GUIDE W/DEVICE KIT: 1.0000 | PACK | Freq: Four times a day (QID) | 0 refills | Status: AC

## 2022-10-02 NOTE — Progress Notes (Signed)
Patient ID: Kimberly [name], female   DOB: 12/10/1950, 72 y.o.   MRN: [id number]           Reason for Appointment: Endocrinology follow-up    History of Present Illness:          Date of diagnosis of type 2 diabetes mellitus: 1995        Background history:   For several years she had been tried on oral agents with variable control.  Detailed history not available She thinks that she had nausea and vomiting with metformin and could not take much Also had been tried on Amaryl  She thinks she had swelling with Januvia and not clear what other medications she has tried before  She was started on insulin in 07/2013 when her A1c was over 11% Because of her poor control and A1c of 9.1 she was started on Invokana 100 mg daily in 12/16  Recent history:   INSULIN regimen is: Tresiba U-200, 42 hs   Novolog 18 units before lunch. Novolog 10 acs    Non-insulin hypoglycemic drugs the patient is taking are:  Invokana 300 mg daily  Her A1c is slightly better at 7.5  A1c previously has been as low as 6.7   Current blood sugar patterns, management and problems identified: Blood sugars were assessed today from her home record  She has been reluctant to consider the freestyle libre system and prefers to monitor with fingersticks A1c is slightly better but home blood sugars are lower than expected with recent average only 111 Most of her monitoring is after lunch and some after waking up Only sporadically may have readings over 170 after lunch but generally better after dinner No hypoglycemia symptoms though occasionally may have readings as low as 68 after lunch She tries to eat more salads during warmer months and will not change her insulin doses for this She does like to exercise when the weather is good and is walking up to 45 min daily      Side effects from medications have been: Nausea from metformin,?  Swelling from Januvia  Glucose monitoring:  done 1-2 times a day         Glucometer:   Accu-Chek       Blood Glucose readings from monitor    PRE-MEAL Fasting Lunch Dinner Bedtime Overall  Glucose range: 91-129 90-120     Mean/median:     111   POST-MEAL PC Breakfast PC Lunch PC Dinner  Glucose range:  68-207 93-128  Mean/median:      Previously   PRE-MEAL Fasting Lunch Dinner Bedtime Overall  Glucose range: 112-130      Mean/median:     136   POST-MEAL PC Breakfast PC Lunch PC Dinner  Glucose range:  98-187 92-158  Mean/median:         Self-care: The diet that the patient has been following is: tries to limit fats, breads.     Meal times are:  Lunch: 12 pm Dinner: 5-7 PM   Typical meal intake: Breakfast is minimal          Dietician visit, most recent: 09/2015                Weight history:  Wt Readings from Last 3 Encounters:  10/02/22 163 lb 9.6 oz (74.2 kg)  09/02/22 160 lb 6.4 oz (72.8 kg)  06/24/22 164 lb (74.4 kg)    Glycemic control:   Lab Results  Component Value Date   HGBA1C 7.5 (  H) 09/29/2022   HGBA1C 7.7 (H) 06/20/2022   HGBA1C 7.3 (H) 02/18/2022   Lab Results  Component Value Date   MICROALBUR 40.2 (H) 06/20/2022   LDLCALC 80 02/18/2022   CREATININE 0.96 09/29/2022    Other active problems discussed today: See review of systems   Allergies as of 10/02/2022       Reactions   Metformin And Related Nausea And Vomiting   After one pill at hospital   Januvia [sitagliptin Phosphate] Other (See Comments)   Swelling and edema see 4 13 OV        Medication List        Accurate as of October 02, 2022  8:44 AM. If you have any questions, ask your nurse or doctor.          Accu-Chek FastClix Lancets Misc Use as instructed to check blood sugar 4 times daily.   Accu-Chek Guide test strip Generic drug: glucose blood USE ACCU CHECK GUIDE TEST STRIPS AS INSTRUCTED TO CHECK BLOOD SUGAR 4 TIMES DAILY.   Accu-Chek Guide w/Device Kit 1 each by Does not apply route 4 (four) times daily. Use Accu Chek to check blood sugar 4  times daily.   albuterol 108 (90 Base) MCG/ACT inhaler Commonly known as: VENTOLIN HFA INHALE 2 PUFFS INTO THE LUNGS EVERY 6 HOURS AS NEEDED FOR SHORTNESS OF BREATH OR WHEEZING   aspirin EC 325 MG tablet Take 325 mg by mouth every morning.   atorvastatin 80 MG tablet Commonly known as: LIPITOR TAKE 1 TABLET BY MOUTH EVERY DAY   BD Pen Needle Nano U/F 32G X 4 MM Misc Generic drug: Insulin Pen Needle Use as instructed to inject 3 insulin 3 times daily.   ezetimibe 10 MG tablet Commonly known as: ZETIA TAKE 1 TABLET BY MOUTH EVERY DAY   fenofibrate 145 MG tablet Commonly known as: TRICOR TAKE 1 TABLET (145 MG TOTAL) BY MOUTH DAILY. STOP THE ZETIA   Invokana 300 MG Tabs tablet Generic drug: canagliflozin TAKE 1 TABLET BY MOUTH EVERY DAY BEFORE BREAKFAST   irbesartan 300 MG tablet Commonly known as: AVAPRO TAKE 1 TABLET BY MOUTH EVERY DAY   levothyroxine 50 MCG tablet Commonly known as: SYNTHROID TAKE 1 TABLET BY MOUTH EVERY DAY   NovoLOG FlexPen 100 UNIT/ML FlexPen Generic drug: insulin aspart INJECT 18 UNITS UNDER THE SKIN AT LARGEST MEAL AND 10 UNITS AT Laural Golden   Evaristo Bury FlexTouch 200 UNIT/ML FlexTouch Pen Generic drug: insulin degludec INJECT 42 UNITS INTO THE SKIN DAILY.        Allergies:  Allergies  Allergen Reactions   Metformin And Related Nausea And Vomiting    After one pill at hospital   Januvia [Sitagliptin Phosphate] Other (See Comments)    Swelling and edema see 4 13 OV    Past Medical History:  Diagnosis Date   CAD (coronary artery disease)    Colitis    Colitis, indeterminate 01/31/2012   COPD (chronic obstructive pulmonary disease)    Depression 05/16/2017   Depression with anxiety 05/30/2016   Diverticulitis 02/10/2012   DM (diabetes mellitus)    Glaucoma    Glaucoma    History of ventricular fibrillation    HTN (hypertension)    Hyperlipidemia    Myocardial infarction    12/1998, 01/2011   Osteoporosis    PVD (peripheral vascular  disease)     Past Surgical History:  Procedure Laterality Date   ABDOMINAL HYSTERECTOMY     endometriosis   CABG x 3  01/22/2011   Dr Laneta Simmers   CARDIAC CATHETERIZATION  01/17/2011   Dr Alger Simons   LIPOMA EXCISION Left 02/08/2015   Procedure: EXCISION OF LEFT THIGH LIPOMA;  Surgeon: Gaynelle Adu, MD;  Location: [facility];  Service: General;  Laterality: Left;   S/P aortobifemoral bypass  2008   Dr Arbie Cookey   S/P myocardial infarction and PCI of the lt circumflex  2000    Family History  Problem Relation Age of Onset   Heart attack Father    Diabetes Father    Heart disease Father    Diabetes Brother    Diabetes Mother    Liver cancer Mother    Hypertension Other    Thyroid disease Other        sisters- sister had parathyroidectomy   Alcohol abuse Other        brother   Colon polyps Maternal Uncle    Breast cancer Sister    Diabetes Sister    Thyroid disease Sister     Social History:  reports that she has been smoking cigarettes. She has been smoking an average of 1 pack per day. She has never used smokeless tobacco. She reports that she does not drink alcohol and does not use drugs.    Review of Systems    Lipid history: On 80 mg Lipitor  with history of CAD Prescribed by PCP Also on fenofibrate and Zetia   Lab Results  Component Value Date   CHOL 143 02/18/2022   CHOL 132 08/13/2021   CHOL 173 05/02/2021   Lab Results  Component Value Date   HDL 24.80 (L) 02/18/2022   HDL 25.60 (L) 08/13/2021   HDL 30.90 (L) 05/02/2021   Lab Results  Component Value Date   LDLCALC 80 02/18/2022   LDLCALC 105 (H) 05/02/2021   LDLCALC 51 01/31/2020   Lab Results  Component Value Date   TRIG 187.0 (H) 02/18/2022   TRIG 214.0 (H) 08/13/2021   TRIG 185.0 (H) 05/02/2021   Lab Results  Component Value Date   CHOLHDL 6 02/18/2022   CHOLHDL 5 08/13/2021   CHOLHDL 6 05/02/2021   Lab Results  Component Value Date   LDLDIRECT 77.0 08/13/2021   LDLDIRECT 66.0  02/26/2021   LDLDIRECT 65.0 12/26/2020            Hypertension: Present for several years  Blood pressure at home recent range 121-140/77-82   She does have microalbuminuria  BP Readings from Last 3 Encounters:  10/02/22 122/84  09/02/22 (!) 160/72  06/24/22 (!) 142/78    HYPERCALCEMIA: She has had periodic mild hypercalcemia since about 2010; PTH levels in the mid-upper range Calcium is generally upper normal and stable  Lab Results  Component Value Date   CALCIUM 10.0 09/29/2022    She is taking 1000 units of vitamin D with adequate vitamin D levels  OSTEOPENIA:  No history of fractures  Her last bone density was in 4/22  Results:   Lumbar spine L1-L4(L3) Femoral neck (FN)  T-score 0.2 RFN: -1.0 LFN: -1.3  Change in BMD from previous DXA test (%) Down 0.6% Down 2.0%    She was tried on Evista but she says it was causing her hot flashes and night sweats and she stopped taking this  Previously in 07/2015 T score -2.3 was at the wrist, this was not measured in 2019  Most recent eye exam was normal in 10/2018 and she does go every year  Most recent foot exam: April 2024,  normal   HYPOTHYROIDISM:  She has a family history of hypothyroidism, Graves' disease and thyroid nodules  On her visit in 11/20 she was complaining of feeling more tired as well as colder; had some weight gain also.   Baseline TSH was 6  With levothyroxine 25 mcg daily she did not feel any better and with TSH still 4.5 she was told to go up to 50 mcg in 07/2019  She feels good Has taken her levothyroxine consistently before breakfast daily  Her TSH is consistently normal  Lab Results  Component Value Date   TSH 3.65 06/20/2022   TSH 3.64 02/18/2022   TSH 3.30 08/13/2021   FREET4 0.97 12/26/2020   FREET4 1.11 01/31/2020   FREET4 1.23 10/24/2019     Review of Systems    LABS:  Lab on 09/29/2022  Component Date Value Ref Range Status   Hgb A1c MFr Bld 09/29/2022 7.5 (H)  4.6  - 6.5 % Final   Glycemic Control Guidelines for People with Diabetes:Non Diabetic:  <6%Goal of Therapy: <7%Additional Action Suggested:  >8%    Sodium 09/29/2022 137  135 - 145 mEq/L Final   Potassium 09/29/2022 4.6  3.5 - 5.1 mEq/L Final   Chloride 09/29/2022 102  96 - 112 mEq/L Final   CO2 09/29/2022 26  19 - 32 mEq/L Final   Glucose, Bld 09/29/2022 105 (H)  70 - 99 mg/dL Final   BUN 95/62/1308 21  6 - 23 mg/dL Final   Creatinine, Ser 09/29/2022 0.96  0.40 - 1.20 mg/dL Final   GFR 65/78/4696 59.83 (L)  >60.00 mL/min Final   Calculated using the CKD-EPI Creatinine Equation (2021)   Calcium 09/29/2022 10.0  8.4 - 10.5 mg/dL Final    Physical Examination:  BP 122/84 (BP Location: Left Arm, Patient Position: Sitting, Cuff Size: Normal)   Pulse 81   Ht 5\' 4"  (1.626 m)   Wt 163 lb 9.6 oz (74.2 kg)   SpO2 94%   BMI 28.08 kg/m   Diabetic Foot Exam - Simple   Simple Foot Form Diabetic Foot exam was performed with the following findings: Yes   Visual Inspection No deformities, no ulcerations, no other skin breakdown bilaterally: Yes Sensation Testing Intact to touch and monofilament testing bilaterally: Yes Pulse Check Posterior Tibialis and Dorsalis pulse intact bilaterally: Yes Comments      ASSESSMENT:  Diabetes type 2, mildly obese  See history of present illness for detailed discussion of his current management, blood sugar patterns and problems identified  A1c is 7.5  She is on basal bolus insulin with Invokana 300 mg  Blood sugars are slightly better by A1c  However her home blood sugars are excellent and only rarely above target She thinks her meter may not be accurate but her fasting lab glucose was in line with her home readings at 105  She has referred used to consider CGM  MICROALBUMINURIA: Needs follow up on the next visit  HYPOTHYROIDISM: Previously TSH normal  Hypertension: Blood pressure is normal at home and slightly better today in the office  also   PLAN: April  She will continue regular exercise with walking Currently does not need any change in basic insulin regimen However discussed that when she is eating salads are very low carbohydrate meals She will only take 14 units instead of 18 units of her NovoLog Similarly she can adjust her doses at lunch and dinner 2 to 3 units more when eating out or eating a large meal or  dessert No change in Tresiba New glucose monitor prescription sent  Continue monitoring blood pressure at home and can stay on the same medication  Urine microalbumin to be checked next time  Continue Invokana  Follow-up in 3 months    There are no Patient Instructions on file for this visit.   Reather Littler 10/02/2022, 8:44 AM   Note: This office note was prepared with Dragon voice recognition system technology. Any transcriptional errors that result from this process are unintentional.

## 2022-10-29 ENCOUNTER — Other Ambulatory Visit: Payer: Self-pay | Admitting: Family

## 2022-11-17 ENCOUNTER — Other Ambulatory Visit: Payer: Self-pay

## 2022-11-17 DIAGNOSIS — E119 Type 2 diabetes mellitus without complications: Secondary | ICD-10-CM | POA: Diagnosis not present

## 2022-11-17 DIAGNOSIS — Z961 Presence of intraocular lens: Secondary | ICD-10-CM | POA: Diagnosis not present

## 2022-11-17 DIAGNOSIS — H43813 Vitreous degeneration, bilateral: Secondary | ICD-10-CM | POA: Diagnosis not present

## 2022-11-17 DIAGNOSIS — H26491 Other secondary cataract, right eye: Secondary | ICD-10-CM | POA: Diagnosis not present

## 2022-11-17 DIAGNOSIS — H2512 Age-related nuclear cataract, left eye: Secondary | ICD-10-CM | POA: Diagnosis not present

## 2022-11-17 LAB — HM DIABETES EYE EXAM

## 2022-11-17 MED ORDER — FENOFIBRATE 145 MG PO TABS: 145.0000 mg | ORAL_TABLET | Freq: Every day | ORAL | 0 refills | Status: DC

## 2022-11-19 ENCOUNTER — Other Ambulatory Visit: Payer: Self-pay | Admitting: Endocrinology

## 2022-11-21 ENCOUNTER — Other Ambulatory Visit: Payer: Self-pay | Admitting: Endocrinology

## 2022-11-28 DIAGNOSIS — H26491 Other secondary cataract, right eye: Secondary | ICD-10-CM | POA: Diagnosis not present

## 2022-12-05 ENCOUNTER — Other Ambulatory Visit: Payer: Self-pay | Admitting: Endocrinology

## 2023-01-27 ENCOUNTER — Other Ambulatory Visit: Payer: Self-pay | Admitting: Endocrinology

## 2023-01-30 ENCOUNTER — Other Ambulatory Visit (INDEPENDENT_AMBULATORY_CARE_PROVIDER_SITE_OTHER): Payer: Medicare HMO

## 2023-01-30 DIAGNOSIS — E1129 Type 2 diabetes mellitus with other diabetic kidney complication: Secondary | ICD-10-CM

## 2023-01-30 DIAGNOSIS — E1165 Type 2 diabetes mellitus with hyperglycemia: Secondary | ICD-10-CM | POA: Diagnosis not present

## 2023-01-30 DIAGNOSIS — Z794 Long term (current) use of insulin: Secondary | ICD-10-CM

## 2023-01-30 LAB — BASIC METABOLIC PANEL
BUN: 19 mg/dL (ref 6–23)
CO2: 26 mEq/L (ref 19–32)
Calcium: 10.1 mg/dL (ref 8.4–10.5)
Chloride: 101 mEq/L (ref 96–112)
Creatinine, Ser: 0.87 mg/dL (ref 0.40–1.20)
GFR: 67.18 mL/min (ref >60.00)
Glucose, Bld: 106 mg/dL — ABNORMAL HIGH (ref 70–99)
Potassium: 4.6 mEq/L (ref 3.5–5.1)
Sodium: 134 mEq/L — ABNORMAL LOW (ref 135–145)

## 2023-01-30 LAB — HEMOGLOBIN A1C: Hgb A1c MFr Bld: 7.4 % — ABNORMAL HIGH (ref 4.6–6.5)

## 2023-01-30 NOTE — Addendum Note (Signed)
Addended by: Clearnce Sorrel on: 01/30/2023 08:23 AM   Modules accepted: Orders

## 2023-02-03 ENCOUNTER — Ambulatory Visit: Payer: Medicare HMO | Admitting: Endocrinology

## 2023-02-03 ENCOUNTER — Encounter: Payer: Self-pay | Admitting: Endocrinology

## 2023-02-03 VITALS — BP 150/80 | HR 91 | Ht 64.0 in | Wt 160.0 lb

## 2023-02-03 DIAGNOSIS — I10 Essential (primary) hypertension: Secondary | ICD-10-CM

## 2023-02-03 DIAGNOSIS — Z794 Long term (current) use of insulin: Secondary | ICD-10-CM

## 2023-02-03 DIAGNOSIS — E1165 Type 2 diabetes mellitus with hyperglycemia: Secondary | ICD-10-CM | POA: Diagnosis not present

## 2023-02-03 DIAGNOSIS — E063 Autoimmune thyroiditis: Secondary | ICD-10-CM | POA: Diagnosis not present

## 2023-02-03 MED ORDER — HYDROCHLOROTHIAZIDE 12.5 MG PO CAPS: 12.5000 mg | ORAL_CAPSULE | Freq: Every day | ORAL | 1 refills | Status: DC

## 2023-02-03 NOTE — Progress Notes (Signed)
Patient ID: Kimberly [name], female   DOB: [birth year], 72 y.o.   MRN: [id number]           Reason for Appointment: Endocrinology follow-up    History of Present Illness:          Date of diagnosis of type 2 diabetes mellitus: 1995        Background history:   For several years she had been tried on oral agents with variable control.  Detailed history not available She thinks that she had nausea and vomiting with metformin and could not take much Also had been tried on Amaryl  She thinks she had swelling with Januvia and not clear what other medications she has tried before  She was started on insulin in 07/2013 when her A1c was over 11% Because of her poor control and A1c of 9.1 she was started on Invokana 100 mg daily in 12/16  Recent history:   INSULIN regimen is: Tresiba U-200, 42 hs   Novolog 18 units before lunch. Novolog 10 acs    Non-insulin hypoglycemic drugs the patient is taking are:  Invokana 300 mg daily  Her A1c is slightly better at 7.5  A1c previously has been as low as 6.7   Current blood sugar patterns, management and problems identified: Blood sugars were assessed today from her monitor download  She has been getting somewhat higher readings after dinner even though she has not apparently changed her diet Also occasionally may have low normal readings after her lunch Fasting readings are still fairly good with continuing the same dose of [place] She thinks she is taking her mealtime insulin consistently before eating  No hypoglycemia except for 1 reading of 66 She is exercising only when the weather is good and is walking up to 45 min       Side effects from medications have been: Nausea from metformin,?  Swelling from Januvia  Glucose monitoring:  done 1-2 times a day         Glucometer:  Accu-Chek       Blood Glucose readings from monitor    PRE-MEAL Fasting Lunch Dinner Bedtime Overall  Glucose range: 87-1 11      Mean/median:     123   POST-MEAL  PC Breakfast PC Lunch PC Dinner  Glucose range:  66-1 76 1 33-2 05  Mean/median:      Previously:  PRE-MEAL Fasting Lunch Dinner Bedtime Overall  Glucose range: 91-129 90-120     Mean/median:     111   POST-MEAL PC Breakfast PC Lunch PC Dinner  Glucose range:  68-207 93-128  Mean/median:        Self-care: The diet that the patient has been following is: tries to limit fats, breads.     Meal times are:  Lunch: 12 pm Dinner: 5-7 PM   Typical meal intake: Breakfast is minimal          Dietician visit, most recent: 09/2015                Weight history:  Wt Readings from Last 3 Encounters:  02/03/23 160 lb (72.6 kg)  10/02/22 163 lb 9.6 oz (74.2 kg)  09/02/22 160 lb 6.4 oz (72.8 kg)    Glycemic control:   Lab Results  Component Value Date   HGBA1C 7.4 (H) 01/30/2023   HGBA1C 7.5 (H) 09/29/2022   HGBA1C 7.7 (H) 06/20/2022   Lab Results  Component Value Date   MICROALBUR 40.2 (H) 06/20/2022  LDLCALC 80 02/18/2022   CREATININE 0.87 01/30/2023    Other active problems discussed today: See review of systems   Allergies as of 02/03/2023       Reactions   Metformin And Related Nausea And Vomiting   After one pill at hospital   Januvia [sitagliptin Phosphate] Other (See Comments)   Swelling and edema see 4 13 OV        Medication List        Accurate as of February 03, 2023  8:49 AM. If you have any questions, ask your nurse or doctor.          Accu-Chek FastClix Lancets Misc Use as instructed to check blood sugar 4 times daily.   Accu-Chek Guide test strip Generic drug: glucose blood USE ACCU CHECK GUIDE TEST STRIPS AS INSTRUCTED TO CHECK BLOOD SUGAR 4 TIMES DAILY.   Accu-Chek Guide w/Device Kit 1 each by Does not apply route 4 (four) times daily. Use Accu Chek to check blood sugar 4 times daily.   albuterol 108 (90 Base) MCG/ACT inhaler Commonly known as: VENTOLIN HFA INHALE 2 PUFFS INTO THE LUNGS EVERY 6 HOURS AS NEEDED FOR SHORTNESS OF BREATH OR  WHEEZING   aspirin EC 325 MG tablet Take 325 mg by mouth every morning.   atorvastatin 80 MG tablet Commonly known as: LIPITOR TAKE 1 TABLET BY MOUTH EVERY DAY   BD Pen Needle Nano U/F 32G X 4 MM Misc Generic drug: Insulin Pen Needle Use as instructed to inject 3 insulin 3 times daily.   ezetimibe 10 MG tablet Commonly known as: ZETIA TAKE 1 TABLET BY MOUTH EVERY DAY   fenofibrate 145 MG tablet Commonly known as: TRICOR Take 1 tablet (145 mg total) by mouth daily. Stop the zetia   Invokana 300 MG Tabs tablet Generic drug: canagliflozin TAKE 1 TABLET BY MOUTH EVERY DAY BEFORE BREAKFAST   irbesartan 300 MG tablet Commonly known as: AVAPRO TAKE 1 TABLET BY MOUTH EVERY DAY   levothyroxine 50 MCG tablet Commonly known as: SYNTHROID TAKE 1 TABLET BY MOUTH EVERY DAY   NovoLOG FlexPen 100 UNIT/ML FlexPen Generic drug: insulin aspart INJECT 14 UNITS UNDER THE SKIN AT LARGEST MEAL AND 10 UNITS AT Laural Golden   Evaristo Bury FlexTouch 200 UNIT/ML FlexTouch Pen Generic drug: insulin degludec INJECT 42 UNITS INTO THE SKIN DAILY.        Allergies:  Allergies  Allergen Reactions   Metformin And Related Nausea And Vomiting    After one pill at hospital   Januvia [Sitagliptin Phosphate] Other (See Comments)    Swelling and edema see 4 13 OV    Past Medical History:  Diagnosis Date   CAD (coronary artery disease)    Colitis    Colitis, indeterminate 01/31/2012   COPD (chronic obstructive pulmonary disease) (HCC)    Depression 05/16/2017   Depression with anxiety 05/30/2016   Diverticulitis 02/10/2012   DM (diabetes mellitus) (HCC)    Glaucoma    Glaucoma    History of ventricular fibrillation    HTN (hypertension)    Hyperlipidemia    Myocardial infarction (HCC)    12/1998, 01/2011   Osteoporosis    PVD (peripheral vascular disease) [facility])     Past Surgical History:  Procedure Laterality Date   ABDOMINAL HYSTERECTOMY     endometriosis   CABG x 3  01/22/2011   Dr Laneta Simmers    CARDIAC CATHETERIZATION  01/17/2011   Dr Alger Simons   LIPOMA EXCISION Left 02/08/2015   Procedure: EXCISION OF  LEFT THIGH LIPOMA;  Surgeon: Gaynelle Adu, MD;  Location: [facility];  Service: General;  Laterality: Left;   S/P aortobifemoral bypass  2008   Dr Arbie Cookey   S/P myocardial infarction and PCI of the lt circumflex  2000    Family History  Problem Relation Age of Onset   Heart attack Father    Diabetes Father    Heart disease Father    Diabetes Brother    Diabetes Mother    Liver cancer Mother    Hypertension Other    Thyroid disease Other        sisters- sister had parathyroidectomy   Alcohol abuse Other        brother   Colon polyps Maternal Uncle    Breast cancer Sister    Diabetes Sister    Thyroid disease Sister     Social History:  reports that she has been smoking cigarettes. She has never used smokeless tobacco. She reports that she does not drink alcohol and does not use drugs.    Review of Systems    Lipid history: On 80 mg Lipitor  with history of CAD Prescribed by PCP Also on fenofibrate and Zetia   Lab Results  Component Value Date   CHOL 143 02/18/2022   CHOL 132 08/13/2021   CHOL 173 05/02/2021   Lab Results  Component Value Date   HDL 24.80 (L) 02/18/2022   HDL 25.60 (L) 08/13/2021   HDL 30.90 (L) 05/02/2021   Lab Results  Component Value Date   LDLCALC 80 02/18/2022   LDLCALC 105 (H) 05/02/2021   LDLCALC 51 01/31/2020   Lab Results  Component Value Date   TRIG 187.0 (H) 02/18/2022   TRIG 214.0 (H) 08/13/2021   TRIG 185.0 (H) 05/02/2021   Lab Results  Component Value Date   CHOLHDL 6 02/18/2022   CHOLHDL 5 08/13/2021   CHOLHDL 6 05/02/2021   Lab Results  Component Value Date   LDLDIRECT 77.0 08/13/2021   LDLDIRECT 66.0 02/26/2021   LDLDIRECT 65.0 12/26/2020            Hypertension: Present for several years  Blood pressure at home ?   She does have microalbuminuria  BP Readings from Last 3 Encounters:   02/03/23 (!) 150/80  10/02/22 122/84  09/02/22 (!) 160/72    HYPERCALCEMIA: She has had periodic mild hypercalcemia since about 2010; PTH levels in the mid-upper range Calcium is generally upper normal and stable  Lab Results  Component Value Date   CALCIUM 10.1 01/30/2023    She is taking 1000 units of vitamin D with adequate vitamin D levels  OSTEOPENIA:  No history of fractures  Her last bone density was in 4/22  Results:   Lumbar spine L1-L4(L3) Femoral neck (FN)  T-score 0.2 RFN: -1.0 LFN: -1.3  Change in BMD from previous DXA test (%) Down 0.6% Down 2.0%    She was tried on Evista but she says it was causing her hot flashes and night sweats and she stopped taking this  Previously in 07/2015 T score -2.3 was at the wrist, this was not measured in 2019  Most recent eye exam was normal in 10/2018 and she does go every year  Most recent foot exam: April 2024, normal   HYPOTHYROIDISM:  She has a family history of hypothyroidism, Graves' disease and thyroid nodules  On her visit in 11/20 she was complaining of feeling more tired as well as colder; had some weight gain also.  Baseline TSH was 6  With levothyroxine 25 mcg daily she did not feel any better and with TSH still 4.5 she was told to go up to 50 mcg in 07/2019  She feels good Has taken her levothyroxine consistently before breakfast daily  Her TSH is consistently normal  Lab Results  Component Value Date   TSH 3.65 06/20/2022   TSH 3.64 02/18/2022   TSH 3.30 08/13/2021   FREET4 0.97 12/26/2020   FREET4 1.11 01/31/2020   FREET4 1.23 10/24/2019     Review of Systems    LABS:  Lab on 01/30/2023  Component Date Value Ref Range Status   Sodium 01/30/2023 134 (L)  135 - 145 mEq/L Final   Potassium 01/30/2023 4.6  3.5 - 5.1 mEq/L Final   Chloride 01/30/2023 101  96 - 112 mEq/L Final   CO2 01/30/2023 26  19 - 32 mEq/L Final   Glucose, Bld 01/30/2023 106 (H)  70 - 99 mg/dL Final   BUN  16/03/9603 19  6 - 23 mg/dL Final   Creatinine, Ser 01/30/2023 0.87  0.40 - 1.20 mg/dL Final   GFR 54/02/8118 67.18  >60.00 mL/min Final   Calculated using the CKD-EPI Creatinine Equation (2021)   Calcium 01/30/2023 10.1  8.4 - 10.5 mg/dL Final   Hgb J4N MFr Bld 01/30/2023 7.4 (H)  4.6 - 6.5 % Final   Glycemic Control Guidelines for People with Diabetes:Non Diabetic:  <6%Goal of Therapy: <7%Additional Action Suggested:  >8%     Physical Examination:  BP (!) 150/80   Pulse 91   Ht 5\' 4"  (1.626 m)   Wt 160 lb (72.6 kg)   SpO2 95%   BMI 27.46 kg/m     ASSESSMENT:  Diabetes type 2, mildly obese  See history of present illness for detailed discussion of his current management, blood sugar patterns and problems identified  A1c is 7.4 compared to 7.5  She is on basal bolus insulin with Invokana 300 mg  Blood sugars are overall well-controlled except after dinner as discussed above Usually trying to be consistent with diet although may not have done as much walking recently because of the weather  She has refused to consider CGM and wants to continue fingersticks  MICROALBUMINURIA: Needs follow up on the next visit  HYPOTHYROIDISM: Previously TSH normal  Hypertension: Blood pressure is higher today and she does not remember any home readings   PLAN:   She will continue the same dose of [place] However she will take 2 units less for her lunch coverage and go up to 12 units at dinnertime to even out her blood sugar readings postprandially Hopefully she can do more regular exercise with walking which may help Also adjust the mealtime dose up or down 2 units based on amounts of carbohydrates or eating any higher fat meals such as fried food  For her hypertension she will go back to HCTZ since blood pressure appears to be significantly higher than usual  More regular monitoring blood pressure at home and follow-up with PCP to make any further adjustments  Urine microalbumin  to be checked on the next visit  Continue Invokana 300 mg daily  Check thyroid levels on the next visit  Follow-up in 3 months    There are no Patient Instructions on file for this visit.   Reather Littler 02/03/2023, 8:49 AM   Note: This office note was prepared with Dragon voice recognition system technology. Any transcriptional errors that result from this process are unintentional.

## 2023-02-03 NOTE — Patient Instructions (Signed)
Novolog 12 average at supper and 16 at lunch

## 2023-02-08 ENCOUNTER — Other Ambulatory Visit: Payer: Self-pay | Admitting: Internal Medicine

## 2023-03-09 NOTE — Progress Notes (Unsigned)
No chief complaint on file.   HPI: Patient  Kimberly [name]  72 y.o. comes in today for Preventive Health Care visit  and  Chronic disease management  Dm and related followed by Endocrine  dr Lucianne Muss recently retired  HT HLD TObaacco   Health Maintenance  Topic Date Due   Colonoscopy  04/24/2015   DTaP/Tdap/Td (2 - Tdap) 01/31/2019   COVID-19 Vaccine (2 - Pfizer risk series) 02/09/2020   Medicare Annual Wellness (AWV)  03/20/2021   INFLUENZA VACCINE  01/15/2023   Diabetic kidney evaluation - Urine ACR  06/21/2023   HEMOGLOBIN A1C  08/02/2023   FOOT EXAM  10/02/2023   OPHTHALMOLOGY EXAM  11/17/2023   Diabetic kidney evaluation - eGFR measurement  01/30/2024   Pneumonia Vaccine 44+ Years old  Completed   DEXA SCAN  Completed   Hepatitis C Screening  Completed   Zoster Vaccines- Shingrix  Completed   HPV VACCINES  Aged Out   Health Maintenance Review LIFESTYLE:  Exercise:   Tobacco/ETS: Alcohol:  Sugar beverages: Sleep: Drug use: no HH of  Work:    ROS:  GEN/ HEENT: No fever, significant weight changes sweats headaches vision problems hearing changes, CV/ PULM; No chest pain shortness of breath cough, syncope,edema  change in exercise tolerance. GI /GU: No adominal pain, vomiting, change in bowel habits. No blood in the stool. No significant GU symptoms. SKIN/HEME: ,no acute skin rashes suspicious lesions or bleeding. No lymphadenopathy, nodules, masses.  NEURO/ PSYCH:  No neurologic signs such as weakness numbness. No depression anxiety. IMM/ Allergy: No unusual infections.  Allergy .   REST of 12 system review negative except as per HPI   Past Medical History:  Diagnosis Date   CAD (coronary artery disease)    Colitis    Colitis, indeterminate 01/31/2012   COPD (chronic obstructive pulmonary disease) (HCC)    Depression 05/16/2017   Depression with anxiety 05/30/2016   Diverticulitis 02/10/2012   DM (diabetes mellitus) (HCC)    Glaucoma    Glaucoma     History of ventricular fibrillation    HTN (hypertension)    Hyperlipidemia    Myocardial infarction (HCC)    12/1998, 01/2011   Osteoporosis    PVD (peripheral vascular disease) (HCC)     Past Surgical History:  Procedure Laterality Date   ABDOMINAL HYSTERECTOMY     endometriosis   CABG x 3  01/22/2011   Dr Laneta Simmers   CARDIAC CATHETERIZATION  01/17/2011   Dr Alger Simons   LIPOMA EXCISION Left 02/08/2015   Procedure: EXCISION OF LEFT THIGH LIPOMA;  Surgeon: Gaynelle Adu, MD;  Location: [facility];  Service: General;  Laterality: Left;   S/P aortobifemoral bypass  2008   Dr Arbie Cookey   S/P myocardial infarction and PCI of the lt circumflex  2000    Family History  Problem Relation Age of Onset   Heart attack Father    Diabetes Father    Heart disease Father    Diabetes Brother    Diabetes Mother    Liver cancer Mother    Hypertension Other    Thyroid disease Other        sisters- sister had parathyroidectomy   Alcohol abuse Other        brother   Colon polyps Maternal Uncle    Breast cancer Sister    Diabetes Sister    Thyroid disease Sister     Social History   Socioeconomic History   Marital status: Married  Spouse name: Not on file   Number of children: 3   Years of education: Not on file   Highest education level: Not on file  Occupational History   Occupation: housewife    Employer: UNEMPLOYED  Tobacco Use   Smoking status: Every Day    Current packs/day: 1.00    Types: Cigarettes   Smokeless tobacco: Never  Vaping Use   Vaping status: Former  Substance and Sexual Activity   Alcohol use: No   Drug use: No   Sexual activity: Not on file  Other Topics Concern   Not on file  Social History Narrative   t widowed    Regular exercise- yes not recently       Quit tobacco in August at heart surgery. Restarted    Husband diagnosed with liver cancer since 2014 passed April 2015    She was care taker.   No pets   Social Determinants of Health    Financial Resource Strain: Low Risk  (03/20/2020)   Overall Financial Resource Strain (CARDIA)    Difficulty of Paying Living Expenses: Not hard at all  Recent Concern: Financial Resource Strain - Medium Risk (01/30/2020)   Overall Financial Resource Strain (CARDIA)    Difficulty of Paying Living Expenses: Somewhat hard  Food Insecurity: No Food Insecurity (03/20/2020)   Hunger Vital Sign    Worried About Running Out of Food in the Last Year: Never true    Ran Out of Food in the Last Year: Never true  Transportation Needs: No Transportation Needs (03/20/2020)   PRAPARE - Administrator, Civil Service (Medical): No    Lack of Transportation (Non-Medical): No  Physical Activity: Insufficiently Active (03/20/2020)   Exercise Vital Sign    Days of Exercise per Week: 5 days    Minutes of Exercise per Session: 10 min  Stress: No Stress Concern Present (03/20/2020)   [employer] of Occupational Health - Occupational Stress Questionnaire    Feeling of Stress : Not at all  Social Connections: Socially Isolated (03/20/2020)   Social Connection and Isolation Panel [NHANES]    Frequency of Communication with Friends and Family: More than three times a week    Frequency of Social Gatherings with Friends and Family: Three times a week    Attends Religious Services: Never    Active Member of Clubs or Organizations: No    Attends Banker Meetings: Never    Marital Status: Widowed    Outpatient Medications Prior to Visit  Medication Sig Dispense Refill   Accu-Chek FastClix Lancets MISC Use as instructed to check blood sugar 4 times daily. 150 each 2   ACCU-CHEK GUIDE test strip USE ACCU CHECK GUIDE TEST STRIPS AS INSTRUCTED TO CHECK BLOOD SUGAR 4 TIMES DAILY. 100 strip 4   albuterol (VENTOLIN HFA) 108 (90 Base) MCG/ACT inhaler INHALE 2 PUFFS INTO THE LUNGS EVERY 6 HOURS AS NEEDED FOR SHORTNESS OF BREATH OR WHEEZING 6.7 each 2   aspirin 325 MG EC tablet Take 325 mg by  mouth every morning.     atorvastatin (LIPITOR) 80 MG tablet TAKE 1 TABLET BY MOUTH EVERY DAY 90 tablet 2   Blood Glucose Monitoring Suppl (ACCU-CHEK GUIDE) w/Device KIT 1 each by Does not apply route 4 (four) times daily. Use Accu Chek to check blood sugar 4 times daily. 1 kit 0   ezetimibe (ZETIA) 10 MG tablet TAKE 1 TABLET BY MOUTH EVERY DAY 90 tablet 2   fenofibrate (TRICOR) 145 MG  tablet TAKE 1 TABLET BY MOUTH DAILY**STOP THE ZETIA 90 tablet 0   hydrochlorothiazide (MICROZIDE) 12.5 MG capsule Take 1 capsule (12.5 mg total) by mouth daily. 30 capsule 1   insulin aspart (NOVOLOG FLEXPEN) 100 UNIT/ML FlexPen INJECT 14 UNITS UNDER THE SKIN AT LARGEST MEAL AND 10 UNITS AT DINNER 15 mL 2   insulin degludec (TRESIBA FLEXTOUCH) 200 UNIT/ML FlexTouch Pen INJECT 42 UNITS INTO THE SKIN DAILY. 9 mL 2   Insulin Pen Needle (BD PEN NEEDLE NANO U/F) 32G X 4 MM MISC Use as instructed to inject 3 insulin 3 times daily. 100 each 3   INVOKANA 300 MG TABS tablet TAKE 1 TABLET BY MOUTH EVERY DAY BEFORE BREAKFAST 90 tablet 1   irbesartan (AVAPRO) 300 MG tablet TAKE 1 TABLET BY MOUTH EVERY DAY 90 tablet 1   levothyroxine (SYNTHROID) 50 MCG tablet TAKE 1 TABLET BY MOUTH EVERY DAY 90 tablet 3   No facility-administered medications prior to visit.     EXAM:  There were no vitals taken for this visit.  There is no height or weight on file to calculate BMI. Wt Readings from Last 3 Encounters:  02/03/23 160 lb (72.6 kg)  10/02/22 163 lb 9.6 oz (74.2 kg)  09/02/22 160 lb 6.4 oz (72.8 kg)    Physical Exam: Vital signs reviewed [id number] is a well-developed well-nourished alert cooperative    who appearsr stated age in no acute distress.  HEENT: normocephalic atraumatic , Eyes: PERRL EOM's full, conjunctiva clear, Nares: paten,t no deformity discharge or tenderness., Ears: no deformity EAC's clear TMs with normal landmarks. Mouth: clear OP, no lesions, edema.  Moist mucous membranes. Dentition in adequate  repair. NECK: supple without masses, thyromegaly or bruits. CHEST/PULM:  Clear to auscultation and percussion breath sounds equal no wheeze , rales or rhonchi. No chest Rise deformities or tenderness. Breast: normal by inspection . No dimpling, discharge, masses, tenderness or discharge . CV: PMI is nondisplaced, S1 S2 no gallops, murmurs, rubs. Peripheral pulses are full without delay.No JVD .  ABDOMEN: Bowel sounds normal nontender  No guard or rebound, no hepato splenomegal no CVA tenderness.  No hernia. Extremtities:  No clubbing cyanosis or edema, no acute joint swelling or redness no focal atrophy NEURO:  Oriented x3, cranial nerves 3-12 appear to be intact, no obvious focal weakness,gait within normal limits no abnormal reflexes or asymmetrical SKIN: No acute rashes normal turgor, color, no bruising or petechiae. PSYCH: Oriented, good eye contact, no obvious depression anxiety, cognition and judgment appear normal. LN: no cervical axillary inguinal adenopathy  Lab Results  Component Value Date   WBC 7.4 03/04/2022   HGB 15.4 (H) 03/04/2022   HCT 46.3 (H) 03/04/2022   PLT 163.0 03/04/2022   GLUCOSE 106 (H) 01/30/2023   CHOL 143 02/18/2022   TRIG 187.0 (H) 02/18/2022   HDL 24.80 (L) 02/18/2022   LDLDIRECT 77.0 08/13/2021   LDLCALC 80 02/18/2022   ALT 26 06/20/2022   AST 26 06/20/2022   NA 134 (L) 01/30/2023   K 4.6 01/30/2023   CL 101 01/30/2023   CREATININE 0.87 01/30/2023   BUN 19 01/30/2023   CO2 26 01/30/2023   TSH 3.65 06/20/2022   INR 1.06 02/01/2012   HGBA1C 7.4 (H) 01/30/2023   MICROALBUR 40.2 (H) 06/20/2022    BP Readings from Last 3 Encounters:  02/03/23 (!) 150/80  10/02/22 122/84  09/02/22 (!) 160/72    Labplan  reviewed with patient  due  ASSESSMENT AND PLAN:  Discussed the  following assessment and plan:    ICD-10-CM   1. Visit for preventive health examination  Z00.00     2. Medication management  Z79.899     3. Essential hypertension  I10      4. PERIPHERAL VASCULAR DISEASE  I73.9     5. Hyperlipidemia associated with type 2 diabetes mellitus (HCC)  E11.69    E78.5     6. Tobacco use  Z72.0     Chem and A1c done in August  No follow-ups on file.  Patient Care Team: Grisell Bissette, Neta Mends, MD as PCP - General Laneta Simmers, Payton Doughty, MD (Cardiothoracic Surgery) Early, Kristen Loader, MD (Inactive) (Thoracic Diseases) Reather Littler, MD (Inactive) as Consulting Physician (Endocrinology) Verner Chol, [facility] (Inactive) as Pharmacist (Pharmacist) There are no Patient Instructions on file for this visit.  Neta Mends. Shekera Beavers M.D.

## 2023-03-10 ENCOUNTER — Ambulatory Visit: Payer: Medicare HMO | Admitting: Internal Medicine

## 2023-03-10 ENCOUNTER — Encounter: Payer: Self-pay | Admitting: Internal Medicine

## 2023-03-10 VITALS — BP 136/80 | HR 95 | Temp 98.2°F | Ht 63.9 in | Wt 156.8 lb

## 2023-03-10 DIAGNOSIS — Z23 Encounter for immunization: Secondary | ICD-10-CM | POA: Diagnosis not present

## 2023-03-10 DIAGNOSIS — Z0000 Encounter for general adult medical examination without abnormal findings: Secondary | ICD-10-CM

## 2023-03-10 DIAGNOSIS — E1169 Type 2 diabetes mellitus with other specified complication: Secondary | ICD-10-CM

## 2023-03-10 DIAGNOSIS — I10 Essential (primary) hypertension: Secondary | ICD-10-CM

## 2023-03-10 DIAGNOSIS — Z720 Tobacco use: Secondary | ICD-10-CM

## 2023-03-10 DIAGNOSIS — R42 Dizziness and giddiness: Secondary | ICD-10-CM | POA: Diagnosis not present

## 2023-03-10 DIAGNOSIS — I739 Peripheral vascular disease, unspecified: Secondary | ICD-10-CM | POA: Diagnosis not present

## 2023-03-10 DIAGNOSIS — E785 Hyperlipidemia, unspecified: Secondary | ICD-10-CM

## 2023-03-10 DIAGNOSIS — Z79899 Other long term (current) drug therapy: Secondary | ICD-10-CM

## 2023-03-10 DIAGNOSIS — R011 Cardiac murmur, unspecified: Secondary | ICD-10-CM

## 2023-03-10 DIAGNOSIS — Z794 Long term (current) use of insulin: Secondary | ICD-10-CM

## 2023-03-10 DIAGNOSIS — I25810 Atherosclerosis of coronary artery bypass graft(s) without angina pectoris: Secondary | ICD-10-CM

## 2023-03-10 LAB — HEPATIC FUNCTION PANEL
ALT: 24 U/L (ref 0–35)
AST: 27 U/L (ref 0–37)
Albumin: 4.4 g/dL (ref 3.5–5.2)
Alkaline Phosphatase: 58 U/L (ref 39–117)
Bilirubin, Direct: 0.2 mg/dL (ref 0.0–0.3)
Total Bilirubin: 0.6 mg/dL (ref 0.2–1.2)
Total Protein: 7.2 g/dL (ref 6.0–8.3)

## 2023-03-10 LAB — LIPID PANEL
Cholesterol: 147 mg/dL (ref 0–200)
HDL: 28.4 mg/dL — ABNORMAL LOW (ref >39.00)
LDL Cholesterol: 76 mg/dL (ref 0–99)
NonHDL: 118.26
Total CHOL/HDL Ratio: 5
Triglycerides: 210 mg/dL — ABNORMAL HIGH (ref 0.0–149.0)
VLDL: 42 mg/dL — ABNORMAL HIGH (ref 0.0–40.0)

## 2023-03-10 LAB — CBC WITH DIFFERENTIAL/PLATELET
Basophils Absolute: 0.1 10*3/uL (ref 0.0–0.1)
Basophils Relative: 0.7 % (ref 0.0–3.0)
Eosinophils Absolute: 0.1 10*3/uL (ref 0.0–0.7)
Eosinophils Relative: 1.4 % (ref 0.0–5.0)
HCT: 50.2 % — ABNORMAL HIGH (ref 36.0–46.0)
Hemoglobin: 16.5 g/dL — ABNORMAL HIGH (ref 12.0–15.0)
Lymphocytes Relative: 23.5 % (ref 12.0–46.0)
Lymphs Abs: 1.6 10*3/uL (ref 0.7–4.0)
MCHC: 32.8 g/dL (ref 30.0–36.0)
MCV: 88.1 fl (ref 78.0–100.0)
Monocytes Absolute: 0.5 10*3/uL (ref 0.1–1.0)
Monocytes Relative: 7.4 % (ref 3.0–12.0)
Neutro Abs: 4.6 10*3/uL (ref 1.4–7.7)
Neutrophils Relative %: 67 % (ref 43.0–77.0)
Platelets: 192 10*3/uL (ref 150.0–400.0)
RBC: 5.7 Mil/uL — ABNORMAL HIGH (ref 3.87–5.11)
RDW: 15.1 % (ref 11.5–15.5)
WBC: 6.9 10*3/uL (ref 4.0–10.5)

## 2023-03-10 LAB — TSH: TSH: 3.72 u[IU]/mL (ref 0.35–5.50)

## 2023-03-10 LAB — BASIC METABOLIC PANEL
BUN: 25 mg/dL — ABNORMAL HIGH (ref 6–23)
CO2: 29 mEq/L (ref 19–32)
Calcium: 10.4 mg/dL (ref 8.4–10.5)
Chloride: 97 mEq/L (ref 96–112)
Creatinine, Ser: 0.93 mg/dL (ref 0.40–1.20)
GFR: 61.96 mL/min (ref >60.00)
Glucose, Bld: 139 mg/dL — ABNORMAL HIGH (ref 70–99)
Potassium: 4.2 mEq/L (ref 3.5–5.1)
Sodium: 134 mEq/L — ABNORMAL LOW (ref 135–145)

## 2023-03-10 NOTE — Patient Instructions (Addendum)
Good to see y ou today . Still advise stopping tobacco for health reasons.  Your bp is ok but  certainly the low dose diuretic could cause  dizziness when going to stand.   I hear murmur more and need fu echo test and cardiology opinion in case related  and the bp med just aggravated .   Lab today . You will be  contacted about  getting echo tests and cardiology evaluation. I will review record and  let you know if we should hold / stop the  new bp med hydrochlorothiazide 12.5   / advised to stop for now because of sx

## 2023-03-13 NOTE — Progress Notes (Signed)
Ldl is at goal triglycerides up some( can be related to glucose level)  Kidney function and thyroid in range  . Will share info with endocrinology Dr Erroll Luna

## 2023-04-02 ENCOUNTER — Telehealth: Payer: Self-pay | Admitting: Endocrinology

## 2023-04-02 ENCOUNTER — Other Ambulatory Visit: Payer: Self-pay

## 2023-04-02 ENCOUNTER — Ambulatory Visit (HOSPITAL_COMMUNITY): Payer: Medicare HMO | Attending: Cardiology

## 2023-04-02 DIAGNOSIS — R42 Dizziness and giddiness: Secondary | ICD-10-CM

## 2023-04-02 DIAGNOSIS — E1165 Type 2 diabetes mellitus with hyperglycemia: Secondary | ICD-10-CM

## 2023-04-02 DIAGNOSIS — R011 Cardiac murmur, unspecified: Secondary | ICD-10-CM

## 2023-04-02 DIAGNOSIS — I25810 Atherosclerosis of coronary artery bypass graft(s) without angina pectoris: Secondary | ICD-10-CM | POA: Diagnosis not present

## 2023-04-02 DIAGNOSIS — I10 Essential (primary) hypertension: Secondary | ICD-10-CM

## 2023-04-02 LAB — ECHOCARDIOGRAM COMPLETE
Area-P 1/2: 4.26 cm2
S' Lateral: 2.6 cm

## 2023-04-02 MED ORDER — TRESIBA FLEXTOUCH 200 UNIT/ML ~~LOC~~ SOPN: 42.0000 [IU] | PEN_INJECTOR | Freq: Every day | SUBCUTANEOUS | 2 refills | Status: DC

## 2023-04-02 MED ORDER — HYDROCHLOROTHIAZIDE 12.5 MG PO CAPS: 12.5000 mg | ORAL_CAPSULE | Freq: Every day | ORAL | 1 refills | Status: DC

## 2023-04-02 NOTE — Telephone Encounter (Signed)
MEDICATION: Novolog, Tresiba, Hydrochlorothiazide   PHARMACY:  [facility] on [address]  HAS THE PATIENT CONTACTED THEIR PHARMACY?  YES  IS THIS A 90 DAY SUPPLY : YES  IS PATIENT OUT OF MEDICATION: NO  IF NOT; HOW MUCH IS LEFT: 0 days of Novolog (specifically),the other 2 she has a few days left  LAST APPOINTMENT DATE: @8 /20/2024  NEXT APPOINTMENT DATE:@11 /14/2024  DO WE HAVE YOUR PERMISSION TO LEAVE A DETAILED MESSAGE?:  OTHER COMMENTS: [phone]   **Let patient know to contact pharmacy at the end of the day to make sure medication is ready. **  ** Please notify patient to allow 48-72 hours to process**  **Encourage patient to contact the pharmacy for refills or they can request refills through [facility]**

## 2023-04-02 NOTE — Telephone Encounter (Signed)
Refill request complete

## 2023-04-16 ENCOUNTER — Other Ambulatory Visit: Payer: Self-pay | Admitting: Internal Medicine

## 2023-04-29 ENCOUNTER — Other Ambulatory Visit: Payer: Self-pay

## 2023-04-29 DIAGNOSIS — E063 Autoimmune thyroiditis: Secondary | ICD-10-CM

## 2023-04-29 DIAGNOSIS — E1165 Type 2 diabetes mellitus with hyperglycemia: Secondary | ICD-10-CM

## 2023-04-30 ENCOUNTER — Other Ambulatory Visit (INDEPENDENT_AMBULATORY_CARE_PROVIDER_SITE_OTHER): Payer: Medicare HMO

## 2023-04-30 DIAGNOSIS — E1165 Type 2 diabetes mellitus with hyperglycemia: Secondary | ICD-10-CM | POA: Diagnosis not present

## 2023-04-30 DIAGNOSIS — Z794 Long term (current) use of insulin: Secondary | ICD-10-CM

## 2023-04-30 LAB — COMPREHENSIVE METABOLIC PANEL
ALT: 25 U/L (ref 0–35)
AST: 26 U/L (ref 0–37)
Albumin: 4.5 g/dL (ref 3.5–5.2)
Alkaline Phosphatase: 56 U/L (ref 39–117)
BUN: 27 mg/dL — ABNORMAL HIGH (ref 6–23)
CO2: 28 meq/L (ref 19–32)
Calcium: 10.3 mg/dL (ref 8.4–10.5)
Chloride: 97 meq/L (ref 96–112)
Creatinine, Ser: 0.92 mg/dL (ref 0.40–1.20)
GFR: 62.71 mL/min (ref >60.00)
Glucose, Bld: 128 mg/dL — ABNORMAL HIGH (ref 70–99)
Potassium: 4.7 meq/L (ref 3.5–5.1)
Sodium: 132 meq/L — ABNORMAL LOW (ref 135–145)
Total Bilirubin: 0.7 mg/dL (ref 0.2–1.2)
Total Protein: 7.7 g/dL (ref 6.0–8.3)

## 2023-04-30 LAB — MICROALBUMIN / CREATININE URINE RATIO
Creatinine,U: 43.3 mg/dL
Microalb Creat Ratio: 30.2 mg/g — ABNORMAL HIGH (ref 0.0–30.0)
Microalb, Ur: 13.1 mg/dL — ABNORMAL HIGH (ref 0.0–1.9)

## 2023-04-30 LAB — HEMOGLOBIN A1C: Hgb A1c MFr Bld: 7.9 % — ABNORMAL HIGH (ref 4.6–6.5)

## 2023-05-07 ENCOUNTER — Ambulatory Visit: Payer: Medicare HMO | Admitting: Cardiology

## 2023-05-07 ENCOUNTER — Other Ambulatory Visit: Payer: Self-pay | Admitting: Internal Medicine

## 2023-05-07 ENCOUNTER — Encounter: Payer: Self-pay | Admitting: Endocrinology

## 2023-05-07 ENCOUNTER — Ambulatory Visit: Payer: Medicare HMO | Admitting: Endocrinology

## 2023-05-07 VITALS — BP 152/80 | HR 94 | Resp 20 | Ht 63.9 in | Wt 158.8 lb

## 2023-05-07 DIAGNOSIS — I10 Essential (primary) hypertension: Secondary | ICD-10-CM | POA: Diagnosis not present

## 2023-05-07 DIAGNOSIS — E1129 Type 2 diabetes mellitus with other diabetic kidney complication: Secondary | ICD-10-CM | POA: Diagnosis not present

## 2023-05-07 DIAGNOSIS — Z794 Long term (current) use of insulin: Secondary | ICD-10-CM

## 2023-05-07 DIAGNOSIS — R809 Proteinuria, unspecified: Secondary | ICD-10-CM

## 2023-05-07 DIAGNOSIS — E782 Mixed hyperlipidemia: Secondary | ICD-10-CM

## 2023-05-07 DIAGNOSIS — M85851 Other specified disorders of bone density and structure, right thigh: Secondary | ICD-10-CM

## 2023-05-07 DIAGNOSIS — E063 Autoimmune thyroiditis: Secondary | ICD-10-CM

## 2023-05-07 DIAGNOSIS — E559 Vitamin D deficiency, unspecified: Secondary | ICD-10-CM | POA: Diagnosis not present

## 2023-05-07 DIAGNOSIS — E1165 Type 2 diabetes mellitus with hyperglycemia: Secondary | ICD-10-CM | POA: Diagnosis not present

## 2023-05-07 NOTE — Patient Instructions (Signed)
No change 

## 2023-05-07 NOTE — Progress Notes (Signed)
Outpatient Endocrinology Note [place] Rojean Ige, MD  05/07/23  Patient's Name: Kimberly [name]    DOB: 05/22/1951    MRN: [id number]                                                    REASON OF VISIT: Follow up of type 2 diabetes mellitus  PCP: Fabian Sharp Neta Mends, MD  HISTORY OF PRESENT ILLNESS:   Kimberly [name] is a 72 y.o. old female with past medical history listed below, is here for follow up for type 2 diabetes mellitus.  Patient was last seen by Dr. Lucianne Muss in August 2024.  Pertinent Diabetes History: Patient was diagnosed with type 2 diabetes mellitus in 1995.  Insulin therapy was started in 2015.  Chronic Diabetes Complications : Retinopathy: no. Last ophthalmology exam was done on annually, following with ophthalmology regularly.  Nephropathy: Microalbuminuria, on ACE/ARB / irbesartan Theodis Sato Peripheral neuropathy: no Coronary artery disease: yes Stroke: no  Relevant comorbidities and cardiovascular risk factors: Obesity: no Body mass index is 27.34 kg/m.  Hypertension: Yes  Hyperlipidemia : Yes, on statin.  On fenofibrate and Zetia.  Current / Home Diabetic regimen includes: Tresiba U200 42 units at bedtime. NovoLog 14 units before lunch and 10 units with supper. Invokana 300 mg daily.  Prior diabetic medications: Metformin stopped due to nausea and vomiting.  She had taken glimepiride.  Januvia caused swelling.  Glycemic data:   Accu-Chek guide glucometer.  Blood sugar download from November 7 to May 07, 2023 average 144.  Lowest blood sugar 81 and highest 185.  She has been checking an average of 1.5 times a day.  Morning blood sugar 105, 81, 108, 103, 122, 132.  Acceptable.  Blood sugar in the afternoon 175, 185, 137, 158, 152 163, 118, 169.  Acceptable.  No hypoglycemia.  Hypoglycemia: Patient has no hypoglycemic episodes. Patient has hypoglycemia awareness.,  Factors modifying glucose control: 1.  Diabetic diet assessment: 2 meals a day, occasionally snack in the  afternoon.  2.  Staying active or exercising: Walking.  3.  Medication compliance: compliant all of the time.   # HYPOTHYROIDISM:  She has a family history of hypothyroidism, Graves' disease and thyroid nodules. On her visit in 11/20 she was complaining of feeling more tired as well as colder; had some weight gain also. Baseline TSH was 6. With levothyroxine 25 mcg daily she did not feel any better and with TSH still 4.5 she was told to go up to 50 mcg in 07/2019.  OSTEOPENIA:  No history of fractures  Her last bone density was in 4/22  Results:   Lumbar spine L1-L4(L3) Femoral neck (FN)  T-score 0.2 RFN: -1.0 LFN: -1.3  Change in BMD from previous DXA test (%) Down 0.6% Down 2.0%    She was tried on Evista but she says it was causing her hot flashes and night sweats and she stopped taking this.  She is taking 1000 units of vitamin D with adequate vitamin D levels.   Previously in 07/2015 T score -2.3 was at the wrist, this was not measured in 2019.   # HYPERCALCEMIA: She has had periodic mild hypercalcemia since about 2010; PTH normal. Calcium is generally upper normal and stable  Interval history  Glucometer data as reviewed above.  Almost all blood sugars are acceptable as per reviewed  in glucometer.  Recent hemoglobin A1c 7.9%.  Denies numbness and tingling of the feet.  No other complaints today.  She has been taking levothyroxine 50 mcg daily.  Normal TSH in September 3.72.  Recent lab results reviewed with stable renal function, normal serum calcium.  Improvement on the urine microalbumin creatinine ratio to 30.2.  She has been taking vitamin D supplement.  REVIEW OF SYSTEMS As per history of present illness.   PAST MEDICAL HISTORY: Past Medical History:  Diagnosis Date   CAD (coronary artery disease)    Colitis    Colitis, indeterminate 01/31/2012   COPD (chronic obstructive pulmonary disease) (HCC)    Depression 05/16/2017   Depression with anxiety 05/30/2016    Diverticulitis 02/10/2012   DM (diabetes mellitus) (HCC)    Glaucoma    Glaucoma    History of ventricular fibrillation    HTN (hypertension)    Hyperlipidemia    Myocardial infarction (HCC)    12/1998, 01/2011   Osteoporosis    PVD (peripheral vascular disease) (HCC)     PAST SURGICAL HISTORY: Past Surgical History:  Procedure Laterality Date   ABDOMINAL HYSTERECTOMY     endometriosis   CABG x 3  01/22/2011   Dr Laneta Simmers   CARDIAC CATHETERIZATION  01/17/2011   Dr Alger Simons   LIPOMA EXCISION Left 02/08/2015   Procedure: EXCISION OF LEFT THIGH LIPOMA;  Surgeon: Gaynelle Adu, MD;  Location: [facility];  Service: General;  Laterality: Left;   S/P aortobifemoral bypass  2008   Dr Arbie Cookey   S/P myocardial infarction and PCI of the lt circumflex  2000    ALLERGIES: Allergies  Allergen Reactions   Metformin And Related Nausea And Vomiting    After one pill at hospital   Januvia [Sitagliptin Phosphate] Other (See Comments)    Swelling and edema see 4 13 OV    FAMILY HISTORY:  Family History  Problem Relation Age of Onset   Heart attack Father    Diabetes Father    Heart disease Father    Diabetes Brother    Diabetes Mother    Liver cancer Mother    Hypertension Other    Thyroid disease Other        sisters- sister had parathyroidectomy   Alcohol abuse Other        brother   Colon polyps Maternal Uncle    Breast cancer Sister    Diabetes Sister    Thyroid disease Sister     SOCIAL HISTORY: Social History   Socioeconomic History   Marital status: Married    Spouse name: Not on file   Number of children: 3   Years of education: Not on file   Highest education level: Not on file  Occupational History   Occupation: housewife    Employer: UNEMPLOYED  Tobacco Use   Smoking status: Every Day    Current packs/day: 1.00    Types: Cigarettes   Smokeless tobacco: Never  Vaping Use   Vaping status: Former  Substance and Sexual Activity   Alcohol use: No   Drug  use: No   Sexual activity: Not on file  Other Topics Concern   Not on file  Social History Narrative   t widowed    Regular exercise- yes not recently       Quit tobacco in August at heart surgery. Restarted    Husband diagnosed with liver cancer since 2014 passed April 2015    She was care taker.   No  pets   Social Determinants of Health   Financial Resource Strain: Low Risk  (03/20/2020)   Overall Financial Resource Strain (CARDIA)    Difficulty of Paying Living Expenses: Not hard at all  Recent Concern: Financial Resource Strain - Medium Risk (01/30/2020)   Overall Financial Resource Strain (CARDIA)    Difficulty of Paying Living Expenses: Somewhat hard  Food Insecurity: No Food Insecurity (03/10/2023)   Hunger Vital Sign    Worried About Running Out of Food in the Last Year: Never true    Ran Out of Food in the Last Year: Never true  Transportation Needs: No Transportation Needs (03/20/2020)   PRAPARE - Administrator, Civil Service (Medical): No    Lack of Transportation (Non-Medical): No  Physical Activity: Insufficiently Active (03/10/2023)   Exercise Vital Sign    Days of Exercise per Week: 3 days    Minutes of Exercise per Session: 10 min  Stress: No Stress Concern Present (03/10/2023)   [employer] of Occupational Health - Occupational Stress Questionnaire    Feeling of Stress : Not at all  Social Connections: Socially Isolated (03/10/2023)   Social Connection and Isolation Panel [NHANES]    Frequency of Communication with Friends and Family: More than three times a week    Frequency of Social Gatherings with Friends and Family: Twice a week    Attends Religious Services: Never    Database administrator or Organizations: No    Attends Banker Meetings: Never    Marital Status: Widowed    MEDICATIONS:  Current Outpatient Medications  Medication Sig Dispense Refill   Accu-Chek FastClix Lancets MISC Use as instructed to check blood sugar  4 times daily. 150 each 2   ACCU-CHEK GUIDE test strip USE ACCU CHECK GUIDE TEST STRIPS AS INSTRUCTED TO CHECK BLOOD SUGAR 4 TIMES DAILY. 100 strip 4   albuterol (VENTOLIN HFA) 108 (90 Base) MCG/ACT inhaler INHALE 2 PUFFS INTO THE LUNGS EVERY 6 HOURS AS NEEDED FOR SHORTNESS OF BREATH OR WHEEZING 6.7 each 2   aspirin 325 MG EC tablet Take 325 mg by mouth every morning.     atorvastatin (LIPITOR) 80 MG tablet TAKE 1 TABLET BY MOUTH EVERY DAY 90 tablet 2   Blood Glucose Monitoring Suppl (ACCU-CHEK GUIDE) w/Device KIT 1 each by Does not apply route 4 (four) times daily. Use Accu Chek to check blood sugar 4 times daily. 1 kit 0   ezetimibe (ZETIA) 10 MG tablet TAKE 1 TABLET BY MOUTH EVERY DAY 90 tablet 2   fenofibrate (TRICOR) 145 MG tablet TAKE 1 TABLET BY MOUTH DAILY**STOP THE ZETIA 90 tablet 0   hydrochlorothiazide (MICROZIDE) 12.5 MG capsule Take 1 capsule (12.5 mg total) by mouth daily. 30 capsule 1   insulin aspart (NOVOLOG FLEXPEN) 100 UNIT/ML FlexPen INJECT 14 UNITS UNDER THE SKIN AT LARGEST MEAL AND 10 UNITS AT DINNER (Patient taking differently: INJECT 18 UNITS UNDER THE SKIN AT LARGEST MEAL AND 12 UNITS AT DINNER) 15 mL 2   insulin degludec (TRESIBA FLEXTOUCH) 200 UNIT/ML FlexTouch Pen Inject 42 Units into the skin daily. 9 mL 2   Insulin Pen Needle (BD PEN NEEDLE NANO U/F) 32G X 4 MM MISC Use as instructed to inject 3 insulin 3 times daily. 100 each 3   INVOKANA 300 MG TABS tablet TAKE 1 TABLET BY MOUTH EVERY DAY BEFORE BREAKFAST 90 tablet 1   irbesartan (AVAPRO) 300 MG tablet TAKE 1 TABLET BY MOUTH EVERY DAY 90 tablet 1  levothyroxine (SYNTHROID) 50 MCG tablet TAKE 1 TABLET BY MOUTH EVERY DAY 90 tablet 3   No current facility-administered medications for this visit.    PHYSICAL EXAM: Vitals:   05/07/23 0804  BP: (!) 152/80  Pulse: 94  Resp: 20  SpO2: 98%  Weight: 158 lb 12.8 oz (72 kg)  Height: 5' 3.9" (1.623 m)   Body mass index is 27.34 kg/m.  Wt Readings from Last 3  Encounters:  05/07/23 158 lb 12.8 oz (72 kg)  03/10/23 156 lb 12.8 oz (71.1 kg)  02/03/23 160 lb (72.6 kg)    General: Well developed, well nourished female in no apparent distress.  HEENT: AT/[place], no external lesions.  Eyes: Conjunctiva clear and no icterus. Neck: Neck supple  Lungs: Respirations not labored Neurologic: Alert, oriented, normal speech Extremities / Skin: Dry. Psychiatric: Does not appear depressed or anxious  Diabetic Foot Exam - Simple   No data filed    LABS Reviewed Lab Results  Component Value Date   HGBA1C 7.9 (H) 04/30/2023   HGBA1C 7.4 (H) 01/30/2023   HGBA1C 7.5 (H) 09/29/2022   Lab Results  Component Value Date   FRUCTOSAMINE 262 02/03/2019   Lab Results  Component Value Date   CHOL 147 03/10/2023   HDL 28.40 (L) 03/10/2023   LDLCALC 76 03/10/2023   LDLDIRECT 77.0 08/13/2021   TRIG 210.0 (H) 03/10/2023   CHOLHDL 5 03/10/2023   Lab Results  Component Value Date   MICRALBCREAT 30.2 (H) 04/30/2023   MICRALBCREAT 62.0 (H) 06/20/2022   Lab Results  Component Value Date   CREATININE 0.92 04/30/2023   Lab Results  Component Value Date   GFR 62.71 04/30/2023    ASSESSMENT / PLAN  1. Uncontrolled type 2 diabetes mellitus with hyperglycemia, with long-term current use of insulin (HCC)   2. Acquired autoimmune hypothyroidism   3. Essential hypertension   4. Microalbuminuria due to type 2 diabetes mellitus (HCC)   5. Mixed hyperlipidemia   6. Osteopenia of neck of right femur   7. Vitamin D deficiency     Diabetes Mellitus type 2, complicated by microalbuminuria. - Diabetic status / severity: Uncontrolled.  Lab Results  Component Value Date   HGBA1C 7.9 (H) 04/30/2023    - Hemoglobin A1c goal : <7%  With the glucometer data reviewed she has perfectly normal fasting blood sugar and blood sugar around the evening are in acceptable range.  No significant hyperglycemia.  No hypoglycemia.  - Medications: No change. Tresiba U200 42  units at bedtime. NovoLog 14 units before lunch and 10 units with supper. Invokana 300 mg daily.  - Home glucose testing: Before meals and at bedtime. - Discussed/ Gave Hypoglycemia treatment plan.  # Consult : not required at this time.   # Annual urine for microalbuminuria/ creatinine ratio, no microalbuminuria currently, continue ACE/ARB /irbesartan, Invokana. Last  Lab Results  Component Value Date   MICRALBCREAT 30.2 (H) 04/30/2023    # Foot check nightly.  # Annual dilated diabetic eye exams.   - Diet: Make healthy diabetic food choices - Life style / activity / exercise: Discussed.  2. Blood pressure  -  BP Readings from Last 1 Encounters:  05/07/23 (!) 152/80    - Control is not in target.  - No change in current plans.  Patient has been taking hydrochlorothiazide 12.5 mg daily and irbesartan.  Patient is asked to check blood pressure at home, she has a blood pressure medicine and if remained high asked to call  our clinic or primary care provider.  3. Lipid status / Hyperlipidemia - Last  Lab Results  Component Value Date   LDLCALC 76 03/10/2023   - Continue atorvastatin 80 mg daily.  Continue fenofibrate Weldon Picking.  Managed by primary care provider.  # Primary hypothyroidism -TSH normal in September.  Will continue current dose of levothyroxine 50 mcg daily.  Annual thyroid lab.  # Osteopenia -Last DEXA scan in April 2022. -Will check vitamin D level in next set of lab. -Continue vitamin D supplement for now. -Consider repeat DEXA scan in 1 to 2 years.  Diagnoses and all orders for this visit:  Uncontrolled type 2 diabetes mellitus with hyperglycemia, with long-term current use of insulin (HCC) -     Basic metabolic panel -     Hemoglobin A1c  Acquired autoimmune hypothyroidism  Essential hypertension  Microalbuminuria due to type 2 diabetes mellitus (HCC)  Mixed hyperlipidemia  Osteopenia of neck of right femur -     VITAMIN D 25 Hydroxy (Vit-D  Deficiency, Fractures)  Vitamin D deficiency -     VITAMIN D 25 Hydroxy (Vit-D Deficiency, Fractures)    DISPOSITION Follow up in clinic in 4 months suggested.  Patient prefers to come in 4 months rather than in 3 months.   All questions answered and patient verbalized understanding of the plan.  [place] Nealy Karapetian, MD [facility] Endocrinology [facility] [address] [place], [address] [place], [place] [address] Phone # [phone]  At least part of this note was generated using voice recognition software. Inadvertent word errors may have occurred, which were not recognized during the proofreading process.

## 2023-05-24 ENCOUNTER — Other Ambulatory Visit: Payer: Self-pay | Admitting: Endocrinology

## 2023-05-24 DIAGNOSIS — I10 Essential (primary) hypertension: Secondary | ICD-10-CM

## 2023-05-29 ENCOUNTER — Other Ambulatory Visit: Payer: Self-pay

## 2023-05-31 NOTE — Progress Notes (Unsigned)
Cardiology Office Note:    Date:  06/02/2023   ID:  Kimberly [name], DOB 18-Aug-1950, MRN [id number]  PCP:  Madelin Headings, MD  Cardiologist:  None  Electrophysiologist:  None   Referring MD: Madelin Headings, MD   Chief Complaint  Patient presents with   Coronary Artery Disease    History of Present Illness:    Kimberly [name] is a 72 y.o. female with a hx of CAD, COPD, T2DM, PAD status post aortobifemoral bypass in 2008 who is referred by Dr. Fabian Sharp for evaluation of orthostatic hypotension and CAD.  She previously followed with Dr. Eden Emms in cardiology, last seen in 2017.  She had a history of MI and V-fib arrest with PCI to LCx in 2000.  She presented with chest pain in 2012 and found to have severe multivessel disease, underwent CABG x 3 (LIMA-LAD, SVG-OM, SVG-RCA).  Echocardiogram 04/02/2023 showed EF 65 to 70%, grade 1 diastolic dysfunction, mild RV dysfunction, no significant valvular disease.  Denies any chest pain, dyspnea, lower extremity edema, or palpitations.  Reports some lightheadedness when she gets up in the morning, denies any syncope.  States that she does not feel lightheaded throughout the day.  She works daily for 20 to 30 minutes, denies any exertional symptoms.  She is smoking 1 pack/day, not interested in quitting.    Past Medical History:  Diagnosis Date   CAD (coronary artery disease)    Colitis    Colitis, indeterminate 01/31/2012   COPD (chronic obstructive pulmonary disease) (HCC)    Depression 05/16/2017   Depression with anxiety 05/30/2016   Diverticulitis 02/10/2012   DM (diabetes mellitus) (HCC)    Glaucoma    Glaucoma    History of ventricular fibrillation    HTN (hypertension)    Hyperlipidemia    Myocardial infarction (HCC)    12/1998, 01/2011   Osteoporosis    PVD (peripheral vascular disease) (HCC)     Past Surgical History:  Procedure Laterality Date   ABDOMINAL HYSTERECTOMY     endometriosis   CABG x 3  01/22/2011   Dr Laneta Simmers    CARDIAC CATHETERIZATION  01/17/2011   Dr Alger Simons   LIPOMA EXCISION Left 02/08/2015   Procedure: EXCISION OF LEFT THIGH LIPOMA;  Surgeon: Gaynelle Adu, MD;  Location: [facility];  Service: General;  Laterality: Left;   S/P aortobifemoral bypass  2008   Dr Arbie Cookey   S/P myocardial infarction and PCI of the lt circumflex  2000    Current Medications: No outpatient medications have been marked as taking for the 06/02/23 encounter (Office Visit) with Little Ishikawa, MD.     Allergies:   Metformin and related and Januvia [sitagliptin phosphate]   Social History   Socioeconomic History   Marital status: Married    Spouse name: Not on file   Number of children: 3   Years of education: Not on file   Highest education level: Not on file  Occupational History   Occupation: housewife    Employer: UNEMPLOYED  Tobacco Use   Smoking status: Every Day    Current packs/day: 1.00    Types: Cigarettes   Smokeless tobacco: Never  Vaping Use   Vaping status: Former  Substance and Sexual Activity   Alcohol use: No   Drug use: No   Sexual activity: Not on file  Other Topics Concern   Not on file  Social History Narrative   t widowed    Regular exercise- yes not recently  Quit tobacco in August at heart surgery. Restarted    Husband diagnosed with liver cancer since 2014 passed April 2015    She was care taker.   No pets   Social Drivers of Corporate investment banker Strain: Low Risk  (03/20/2020)   Overall Financial Resource Strain (CARDIA)    Difficulty of Paying Living Expenses: Not hard at all  Recent Concern: Financial Resource Strain - Medium Risk (01/30/2020)   Overall Financial Resource Strain (CARDIA)    Difficulty of Paying Living Expenses: Somewhat hard  Food Insecurity: No Food Insecurity (03/10/2023)   Hunger Vital Sign    Worried About Running Out of Food in the Last Year: Never true    Ran Out of Food in the Last Year: Never true  Transportation  Needs: No Transportation Needs (03/20/2020)   PRAPARE - Administrator, Civil Service (Medical): No    Lack of Transportation (Non-Medical): No  Physical Activity: Insufficiently Active (03/10/2023)   Exercise Vital Sign    Days of Exercise per Week: 3 days    Minutes of Exercise per Session: 10 min  Stress: No Stress Concern Present (03/10/2023)   [employer] of Occupational Health - Occupational Stress Questionnaire    Feeling of Stress : Not at all  Social Connections: Socially Isolated (03/10/2023)   Social Connection and Isolation Panel [NHANES]    Frequency of Communication with Friends and Family: More than three times a week    Frequency of Social Gatherings with Friends and Family: Twice a week    Attends Religious Services: Never    Database administrator or Organizations: No    Attends Banker Meetings: Never    Marital Status: Widowed     Family History: The patient's family history includes Alcohol abuse in an other family member; Breast cancer in her sister; Colon polyps in her maternal uncle; Diabetes in her brother, father, mother, and sister; Heart attack in her father; Heart disease in her father; Hypertension in an other family member; Liver cancer in her mother; Thyroid disease in her sister and another family member.  ROS:   Please see the history of present illness.     All other systems reviewed and are negative.  EKGs/Labs/Other Studies Reviewed:    The following studies were reviewed today:   EKG:   06/02/23: NSR, nonspecific T wave flattening, rate 87  Recent Labs: 03/10/2023: Hemoglobin 16.5; Platelets 192.0; TSH 3.72 04/30/2023: ALT 25; BUN 27; Creatinine, Ser 0.92; Potassium 4.7; Sodium 132  Recent Lipid Panel    Component Value Date/Time   CHOL 147 03/10/2023 1008   TRIG 210.0 (H) 03/10/2023 1008   HDL 28.40 (L) 03/10/2023 1008   CHOLHDL 5 03/10/2023 1008   VLDL 42.0 (H) 03/10/2023 1008   LDLCALC 76 03/10/2023 1008    LDLDIRECT 77.0 08/13/2021 0814    Physical Exam:    VS:  Pulse 87   Ht 5\' 4"  (1.626 m)   Wt 158 lb (71.7 kg)   SpO2 95%   BMI 27.12 kg/m     Wt Readings from Last 3 Encounters:  06/02/23 158 lb (71.7 kg)  05/07/23 158 lb 12.8 oz (72 kg)  03/10/23 156 lb 12.8 oz (71.1 kg)     GEN:  Well nourished, well developed in no acute distress HEENT: Normal NECK: No JVD; No carotid bruits LYMPHATICS: No lymphadenopathy CARDIAC: RRR, no murmurs, rubs, gallops RESPIRATORY:  Clear to auscultation without rales, wheezing or rhonchi  ABDOMEN:  Soft, non-tender, non-distended MUSCULOSKELETAL:  No edema; No deformity  SKIN: Warm and dry NEUROLOGIC:  Alert and oriented x 3 PSYCHIATRIC:  Normal affect   ASSESSMENT:    1. Coronary artery disease involving native coronary artery of native heart without angina pectoris   2. Essential hypertension   3. Tobacco use   4. Orthostatic hypotension   5. Hyperlipidemia, unspecified hyperlipidemia type    PLAN:    CAD: history of MI and V-fib arrest with PCI to LCx in 2000.  She presented with chest pain in 2012 and found to have severe multivessel disease, underwent CABG x 3 (LIMA-LAD, SVG-OM, SVG-RCA).  Echocardiogram 04/02/2023 showed EF 65 to 70%, grade 1 diastolic dysfunction, mild RV dysfunction, no significant valvular disease. -Continue aspirin, can reduce to 81 mg daily -Continue atorvastatin 80 mg daily and Zetia 10 mg daily (unclear if she is taking Zetia)  Orthostatic hypotension: Reported positive orthostatic at recent PCP appointment.  Currently denies any lightheadedness with standing.  Currently on irbesartan 300 mg daily and HCTZ 12.5 mg daily but she is not sure what she is taking, will schedule in pharmacy hypertension clinic and ask her to bring her medications  Hypertension: Prescribed irbesartan 300 mg daily and HCTZ 12.5 mg daily but unclear if she is taking, she does not know her medications.  Will schedule in pharmacy  hypertension clinic, asked her to bring her medications with her  Hyperlipidemia: On atorvastatin 80 mg daily, Zetia 10 mg daily, fenofibrate 145 mg daily.  LDL 76 on 03/10/2023.  She reports she does not think she is taking Zetia.  She handling with pharmacy as above, asked her to bring her medications with her  T2DM: On insulin.  Hemoglobin A1c 7.9% on 1424  Tobacco use: Counseled on the risk of tobacco use and cessation strongly encouraged.  She states he is not interested in quitting at this time  RTC in 6 months   Medication Adjustments/Labs and Tests Ordered: Current medicines are reviewed at length with the patient today.  Concerns regarding medicines are outlined above.  Orders Placed This Encounter  Procedures   Basic Metabolic Panel (BMET)   Lipid panel   Magnesium   AMB Referral to [facility] Pharm-D   EKG 12-Lead   No orders of the defined types were placed in this encounter.   Patient Instructions  Medication Instructions:  Continue current medications *If you need a refill on your cardiac medications before your next appointment, please call your pharmacy*   Lab Work: Bmet, lipid panel, Mg today If you have labs (blood work) drawn today and your tests are completely normal, you will receive your results only by: MyChart Message (if you have MyChart) OR A paper copy in the mail If you have any lab test that is abnormal or we need to change your treatment, we will call you to review the results.   Testing/Procedures: none   Follow-Up: At [facility], you and your health needs are our priority.  As part of our continuing mission to provide you with exceptional heart care, we have created designated Provider Care Teams.  These Care Teams include your primary Cardiologist (physician) and Advanced Practice Providers (APPs -  Physician Assistants and Nurse Practitioners) who all work together to provide you with the care you need, when you need it.  We  recommend signing up for the patient portal called "MyChart".  Sign up information is provided on this After Visit Summary.  MyChart is used to connect with  patients for Virtual Visits (Telemedicine).  Patients are able to view lab/test results, encounter notes, upcoming appointments, etc.  Non-urgent messages can be sent to your provider as well.   To learn more about what you can do with MyChart, go to [email or web address].    Your next appointment:   6 month(s)  Provider:   Dr. Bjorn Pippin  Other Instructions Referral referral to [facility] hypertension  Your provider has requested that you bring all your medications to your appt. With the Referral Clinic   Signed, Little Ishikawa, MD  06/02/2023 9:46 AM    [facility]

## 2023-06-02 ENCOUNTER — Telehealth: Payer: Self-pay | Admitting: *Deleted

## 2023-06-02 ENCOUNTER — Ambulatory Visit: Payer: Medicare HMO | Attending: Cardiology | Admitting: Cardiology

## 2023-06-02 VITALS — HR 87 | Ht 64.0 in | Wt 158.0 lb

## 2023-06-02 DIAGNOSIS — I951 Orthostatic hypotension: Secondary | ICD-10-CM | POA: Diagnosis not present

## 2023-06-02 DIAGNOSIS — I2510 Atherosclerotic heart disease of native coronary artery without angina pectoris: Secondary | ICD-10-CM

## 2023-06-02 DIAGNOSIS — E785 Hyperlipidemia, unspecified: Secondary | ICD-10-CM

## 2023-06-02 DIAGNOSIS — Z720 Tobacco use: Secondary | ICD-10-CM | POA: Diagnosis not present

## 2023-06-02 DIAGNOSIS — I10 Essential (primary) hypertension: Secondary | ICD-10-CM | POA: Diagnosis not present

## 2023-06-02 LAB — BASIC METABOLIC PANEL
BUN/Creatinine Ratio: 20 (ref 12–28)
BUN: 24 mg/dL (ref 8–27)
CO2: 21 mmol/L (ref 20–29)
Calcium: 10.2 mg/dL (ref 8.7–10.3)
Chloride: 94 mmol/L — ABNORMAL LOW (ref 96–106)
Creatinine, Ser: 1.21 mg/dL — ABNORMAL HIGH (ref 0.57–1.00)
Glucose: 248 mg/dL — ABNORMAL HIGH (ref 70–99)
Potassium: 4.6 mmol/L (ref 3.5–5.2)
Sodium: 133 mmol/L — ABNORMAL LOW (ref 134–144)
eGFR: 48 mL/min/{1.73_m2} — ABNORMAL LOW (ref >59)

## 2023-06-02 LAB — LIPID PANEL
Chol/HDL Ratio: 7.2 {ratio} — ABNORMAL HIGH (ref 0.0–4.4)
Cholesterol, Total: 173 mg/dL (ref 100–199)
HDL: 24 mg/dL — ABNORMAL LOW (ref >39)
LDL Chol Calc (NIH): 85 mg/dL (ref 0–99)
Triglycerides: 391 mg/dL — ABNORMAL HIGH (ref 0–149)
VLDL Cholesterol Cal: 64 mg/dL — ABNORMAL HIGH (ref 5–40)

## 2023-06-02 LAB — MAGNESIUM: Magnesium: 1.9 mg/dL (ref 1.6–2.3)

## 2023-06-02 MED ORDER — ASPIRIN 81 MG PO CHEW: 81.0000 mg | CHEWABLE_TABLET | Freq: Every day | ORAL | Status: DC

## 2023-06-02 NOTE — Addendum Note (Signed)
Addended by: Alta Corning on: 06/02/2023 09:54 AM   Modules accepted: Orders

## 2023-06-02 NOTE — Telephone Encounter (Signed)
Called patient and made her aware that per Dr. Bjorn Pippin to stop Aspirin 325 mg and start taking Aspirin 81 mg a day. Patient voices an understanding.

## 2023-06-02 NOTE — Patient Instructions (Signed)
Medication Instructions:  Continue current medications *If you need a refill on your cardiac medications before your next appointment, please call your pharmacy*   Lab Work: Bmet, lipid panel, Mg today If you have labs (blood work) drawn today and your tests are completely normal, you will receive your results only by: MyChart Message (if you have MyChart) OR A paper copy in the mail If you have any lab test that is abnormal or we need to change your treatment, we will call you to review the results.   Testing/Procedures: none   Follow-Up: At [facility], you and your health needs are our priority.  As part of our continuing mission to provide you with exceptional heart care, we have created designated Provider Care Teams.  These Care Teams include your primary Cardiologist (physician) and Advanced Practice Providers (APPs -  Physician Assistants and Nurse Practitioners) who all work together to provide you with the care you need, when you need it.  We recommend signing up for the patient portal called "MyChart".  Sign up information is provided on this After Visit Summary.  MyChart is used to connect with patients for Virtual Visits (Telemedicine).  Patients are able to view lab/test results, encounter notes, upcoming appointments, etc.  Non-urgent messages can be sent to your provider as well.   To learn more about what you can do with MyChart, go to [email or web address].    Your next appointment:   6 month(s)  Provider:   Dr. Bjorn Pippin  Other Instructions Referral referral to [facility] hypertension  Your provider has requested that you bring all your medications to your appt. With the Referral Clinic

## 2023-06-04 ENCOUNTER — Telehealth: Payer: Self-pay | Admitting: Emergency Medicine

## 2023-06-04 DIAGNOSIS — Z79899 Other long term (current) drug therapy: Secondary | ICD-10-CM

## 2023-06-04 DIAGNOSIS — N289 Disorder of kidney and ureter, unspecified: Secondary | ICD-10-CM

## 2023-06-04 DIAGNOSIS — I10 Essential (primary) hypertension: Secondary | ICD-10-CM

## 2023-06-04 MED ORDER — AMLODIPINE BESYLATE 5 MG PO TABS: 5.0000 mg | ORAL_TABLET | Freq: Every day | ORAL | 3 refills | Status: DC

## 2023-06-04 NOTE — Telephone Encounter (Signed)
Worsening renal function and low sodium, suspect due to HCTZ use.  Recommend stopping hydrochlorothiazide.  Would start Amlodipine 5 mg daily.  Ask to check daily BP and bring log and home BP monitor to calibrate to upcoming appointment with pharmacy hypertension clinic.  Repeat BMET in 1 week   Went over the information above with the patient. She will come to [facility] 06/12/23 to get BMET. She will d/c hydrochlorothiazide.RX sent to preferred pharmacy for Amlodipine. She will keep a BP log and bring log and BP monitor to pharmD appt on 07/15/23.   She verbalized understanding of all information.

## 2023-06-08 ENCOUNTER — Telehealth: Payer: Self-pay | Admitting: Endocrinology

## 2023-06-08 ENCOUNTER — Other Ambulatory Visit: Payer: Self-pay | Admitting: Endocrinology

## 2023-06-08 ENCOUNTER — Other Ambulatory Visit: Payer: Self-pay

## 2023-06-08 DIAGNOSIS — E1165 Type 2 diabetes mellitus with hyperglycemia: Secondary | ICD-10-CM

## 2023-06-08 MED ORDER — NOVOLOG FLEXPEN 100 UNIT/ML ~~LOC~~ SOPN: PEN_INJECTOR | SUBCUTANEOUS | 2 refills | Status: DC

## 2023-06-08 MED ORDER — INSULIN LISPRO (1 UNIT DIAL) 100 UNIT/ML (KWIKPEN): PEN_INJECTOR | SUBCUTANEOUS | 11 refills | Status: DC

## 2023-06-08 MED ORDER — CANAGLIFLOZIN 300 MG PO TABS: ORAL_TABLET | ORAL | 1 refills | Status: DC

## 2023-06-08 NOTE — Telephone Encounter (Signed)
done

## 2023-06-08 NOTE — Telephone Encounter (Signed)
Patient dropped off patient assistance paperwork.  This was placed in the providers in box at the front desk.  Company is - [employer]

## 2023-06-08 NOTE — Telephone Encounter (Signed)
MEDICATION: Invokana & Novolog  PHARMACY:  [facility] on [address]  HAS THE PATIENT CONTACTED THEIR PHARMACY?  YES  IS THIS A 90 DAY SUPPLY : YES  IS PATIENT OUT OF MEDICATION: YES  IF NOT; HOW MUCH IS LEFT:   LAST APPOINTMENT DATE: @12 /13/2024  NEXT APPOINTMENT DATE:@3 /17/2025  DO WE HAVE YOUR PERMISSION TO LEAVE A DETAILED MESSAGE?: YES

## 2023-06-12 ENCOUNTER — Other Ambulatory Visit: Payer: Self-pay

## 2023-06-12 DIAGNOSIS — N289 Disorder of kidney and ureter, unspecified: Secondary | ICD-10-CM | POA: Diagnosis not present

## 2023-06-12 DIAGNOSIS — Z79899 Other long term (current) drug therapy: Secondary | ICD-10-CM | POA: Diagnosis not present

## 2023-06-12 DIAGNOSIS — I10 Essential (primary) hypertension: Secondary | ICD-10-CM

## 2023-06-12 LAB — BASIC METABOLIC PANEL
BUN/Creatinine Ratio: 22 (ref 12–28)
BUN: 20 mg/dL (ref 8–27)
CO2: 21 mmol/L (ref 20–29)
Calcium: 10.3 mg/dL (ref 8.7–10.3)
Chloride: 98 mmol/L (ref 96–106)
Creatinine, Ser: 0.9 mg/dL (ref 0.57–1.00)
Glucose: 235 mg/dL — ABNORMAL HIGH (ref 70–99)
Potassium: 5 mmol/L (ref 3.5–5.2)
Sodium: 136 mmol/L (ref 134–144)
eGFR: 68 mL/min/{1.73_m2} (ref >59)

## 2023-06-15 ENCOUNTER — Telehealth: Payer: Self-pay | Admitting: *Deleted

## 2023-06-15 NOTE — Telephone Encounter (Signed)
Called and spoke to patient and made patient aware that per Dr. Bjorn Pippin kidney function has improved to normal. Patient voiced an understanding.

## 2023-07-15 ENCOUNTER — Ambulatory Visit: Payer: Medicare HMO

## 2023-07-22 ENCOUNTER — Other Ambulatory Visit: Payer: Self-pay

## 2023-07-22 DIAGNOSIS — E1165 Type 2 diabetes mellitus with hyperglycemia: Secondary | ICD-10-CM

## 2023-07-22 MED ORDER — ACCU-CHEK GUIDE TEST VI STRP: ORAL_STRIP | 12 refills | Status: AC

## 2023-07-28 ENCOUNTER — Other Ambulatory Visit: Payer: Self-pay | Admitting: *Deleted

## 2023-07-28 DIAGNOSIS — I10 Essential (primary) hypertension: Secondary | ICD-10-CM

## 2023-07-28 MED ORDER — IRBESARTAN 300 MG PO TABS: 300.0000 mg | ORAL_TABLET | Freq: Every day | ORAL | 1 refills | Status: DC

## 2023-07-29 ENCOUNTER — Other Ambulatory Visit: Payer: Self-pay | Admitting: Endocrinology

## 2023-07-29 DIAGNOSIS — E1165 Type 2 diabetes mellitus with hyperglycemia: Secondary | ICD-10-CM

## 2023-08-02 ENCOUNTER — Other Ambulatory Visit: Payer: Self-pay | Admitting: Family

## 2023-08-10 ENCOUNTER — Other Ambulatory Visit: Payer: Self-pay

## 2023-08-10 DIAGNOSIS — E782 Mixed hyperlipidemia: Secondary | ICD-10-CM

## 2023-08-10 MED ORDER — FENOFIBRATE 145 MG PO TABS: ORAL_TABLET | ORAL | 0 refills | Status: DC

## 2023-08-17 ENCOUNTER — Other Ambulatory Visit: Payer: Self-pay

## 2023-08-17 DIAGNOSIS — E063 Autoimmune thyroiditis: Secondary | ICD-10-CM

## 2023-08-17 MED ORDER — LEVOTHYROXINE SODIUM 50 MCG PO TABS: 50.0000 ug | ORAL_TABLET | Freq: Every day | ORAL | 3 refills | Status: DC

## 2023-08-17 NOTE — Telephone Encounter (Signed)
 Pt called and lvm asking for a refill of levothyroxine 50 mcg. I called pt back to let her know rx was sent in to [facility] on [address], pt verbalized understanding.

## 2023-08-20 ENCOUNTER — Other Ambulatory Visit: Payer: Self-pay

## 2023-08-31 ENCOUNTER — Other Ambulatory Visit: Payer: Medicare HMO

## 2023-08-31 DIAGNOSIS — Z794 Long term (current) use of insulin: Secondary | ICD-10-CM | POA: Diagnosis not present

## 2023-08-31 DIAGNOSIS — E1165 Type 2 diabetes mellitus with hyperglycemia: Secondary | ICD-10-CM | POA: Diagnosis not present

## 2023-08-31 DIAGNOSIS — E559 Vitamin D deficiency, unspecified: Secondary | ICD-10-CM | POA: Diagnosis not present

## 2023-08-31 DIAGNOSIS — M85851 Other specified disorders of bone density and structure, right thigh: Secondary | ICD-10-CM | POA: Diagnosis not present

## 2023-09-01 LAB — BASIC METABOLIC PANEL
BUN: 18 mg/dL (ref 7–25)
CO2: 26 mmol/L (ref 20–32)
Calcium: 10.2 mg/dL (ref 8.6–10.4)
Chloride: 99 mmol/L (ref 98–110)
Creat: 0.83 mg/dL (ref 0.60–1.00)
Glucose, Bld: 93 mg/dL (ref 65–99)
Potassium: 4.7 mmol/L (ref 3.5–5.3)
Sodium: 136 mmol/L (ref 135–146)

## 2023-09-01 LAB — HEMOGLOBIN A1C
Hgb A1c MFr Bld: 7.3 %{Hb} — ABNORMAL HIGH (ref <5.7)
Mean Plasma Glucose: 163 mg/dL
eAG (mmol/L): 9 mmol/L

## 2023-09-01 LAB — VITAMIN D 25 HYDROXY (VIT D DEFICIENCY, FRACTURES): Vit D, 25-Hydroxy: 32 ng/mL (ref 30–100)

## 2023-09-04 ENCOUNTER — Encounter: Payer: Self-pay | Admitting: Endocrinology

## 2023-09-04 ENCOUNTER — Telehealth: Payer: Self-pay

## 2023-09-04 ENCOUNTER — Ambulatory Visit: Payer: Medicare HMO | Admitting: Endocrinology

## 2023-09-04 VITALS — BP 164/70 | HR 81 | Resp 20 | Ht 64.0 in | Wt 158.6 lb

## 2023-09-04 DIAGNOSIS — Z794 Long term (current) use of insulin: Secondary | ICD-10-CM | POA: Diagnosis not present

## 2023-09-04 DIAGNOSIS — E063 Autoimmune thyroiditis: Secondary | ICD-10-CM | POA: Diagnosis not present

## 2023-09-04 DIAGNOSIS — M85851 Other specified disorders of bone density and structure, right thigh: Secondary | ICD-10-CM | POA: Diagnosis not present

## 2023-09-04 DIAGNOSIS — E1165 Type 2 diabetes mellitus with hyperglycemia: Secondary | ICD-10-CM

## 2023-09-04 MED ORDER — EMPAGLIFLOZIN 25 MG PO TABS: 25.0000 mg | ORAL_TABLET | Freq: Every day | ORAL | 3 refills | Status: DC

## 2023-09-04 NOTE — Patient Instructions (Signed)
 Tresiba U200 42 units at bedtime. NovoLog 18 units before lunch and 12 units with supper.  Stop Invokana and start jardiance 25 mg daily, sent prescription to pharmacy.

## 2023-09-04 NOTE — Telephone Encounter (Signed)
 Patient called office to make MD aware she could not afford Jardiance.

## 2023-09-04 NOTE — Progress Notes (Signed)
 Outpatient Endocrinology Note [place] Smrithi Pigford, MD  09/04/23  Patient's Name: Kimberly [name]    DOB: [birth year]    MRN: [id number]                                                    REASON OF VISIT: Follow up of type 2 diabetes mellitus  PCP: Fabian Sharp Neta Mends, MD  HISTORY OF PRESENT ILLNESS:   Kimberly [name] is a 73 y.o. old female with past medical history listed below, is here for follow up for type 2 diabetes mellitus.    Pertinent Diabetes History: Patient was previously seen by Dr. Lucianne Muss and was last time seen in August 2024.  Patient was diagnosed with type 2 diabetes mellitus in 1995.  Insulin therapy was started in 2015.  Chronic Diabetes Complications : Retinopathy: no. Last ophthalmology exam was done on annually, following with ophthalmology regularly.  Nephropathy: Microalbuminuria, on ACE/ARB / irbesartan Kimberly [name] Peripheral neuropathy: no Coronary artery disease: yes Stroke: no  Relevant comorbidities and cardiovascular risk factors: Obesity: no Body mass index is 27.22 kg/m.  Hypertension: Yes  Hyperlipidemia : Yes, on statin.  On fenofibrate and Zetia.  Current / Home Diabetic regimen includes: Tresiba U200 42 units at bedtime. NovoLog 18 units before lunch and 12 units with supper. Invokana 300 mg daily.  Prior diabetic medications: Metformin stopped due to nausea and vomiting.  She had taken glimepiride.  Januvia caused swelling. Invokana expensive.   Glycemic data:   Accu-Chek guide glucometer.  Blood sugar download from March 7 to September 04, 2023 average 111.  Checking blood sugar mostly 2 times a day.  Fasting blood sugar 99, 108, 102, 90, 105.  Blood sugar in the evening 76, 106, 149, 155, 119, 131, 164, 68, 107.  Mostly acceptable blood sugar.  Patient has no hypoglycemic episodes. Patient has hypoglycemia awareness.,  Factors modifying glucose control: 1.  Diabetic diet assessment: 2 meals a day, occasionally snack in the afternoon.  2.  Staying  active or exercising: Walking.  3.  Medication compliance: compliant all of the time.   # HYPOTHYROIDISM:  She has a family history of hypothyroidism, Graves' disease and thyroid nodules. On her visit in 11/20 she was complaining of feeling more tired as well as colder; had some weight gain also. Baseline TSH was 6. With levothyroxine 25 mcg daily she did not feel any better and with TSH still 4.5 she was told to go up to 50 mcg in 07/2019.  OSTEOPENIA:  No history of fractures  Her last bone density was in 4/22  Results:   Lumbar spine L1-L4(L3) Femoral neck (FN)  T-score 0.2 RFN: -1.0 LFN: -1.3  Change in BMD from previous DXA test (%) Down 0.6% Down 2.0%    She was tried on Evista but she says it was causing her hot flashes and night sweats and she stopped taking this.  She is taking 1000 units of vitamin D with adequate vitamin D levels.   Previously in 07/2015 T score -2.3 was at the wrist, this was not measured in 2019.   # HYPERCALCEMIA: She has had periodic mild hypercalcemia since about 2010; PTH normal. Calcium is generally upper normal and stable  Interval history  Hemoglobin A1c improved to 7.3%.  Glucometer data as reviewed above mostly acceptable.  Patient reports Kimberly [name] is expensive  and she ran out of the Invokana last Saturday and does not want to take anymore.  Diabetes has been reviewed and as noted above.  No hypo and hyperthyroid symptoms, taking levothyroxine 50 mcg daily.  Numbness and tingling of the feet.  No other complaints today.  Recent vitamin D level normal, reports she is currently not taking vitamin D supplement or multivitamins.  REVIEW OF SYSTEMS As per history of present illness.   PAST MEDICAL HISTORY: Past Medical History:  Diagnosis Date   CAD (coronary artery disease)    Colitis    Colitis, indeterminate 01/31/2012   COPD (chronic obstructive pulmonary disease) (HCC)    Depression 05/16/2017   Depression with anxiety 05/30/2016    Diverticulitis 02/10/2012   DM (diabetes mellitus) (HCC)    Glaucoma    Glaucoma    History of ventricular fibrillation    HTN (hypertension)    Hyperlipidemia    Myocardial infarction (HCC)    12/1998, 01/2011   Osteoporosis    PVD (peripheral vascular disease) (HCC)     PAST SURGICAL HISTORY: Past Surgical History:  Procedure Laterality Date   ABDOMINAL HYSTERECTOMY     endometriosis   CABG x 3  01/22/2011   Dr Laneta Simmers   CARDIAC CATHETERIZATION  01/17/2011   Dr Alger Simons   LIPOMA EXCISION Left 02/08/2015   Procedure: EXCISION OF LEFT THIGH LIPOMA;  Surgeon: Gaynelle Adu, MD;  Location: [facility];  Service: General;  Laterality: Left;   S/P aortobifemoral bypass  2008   Dr Arbie Cookey   S/P myocardial infarction and PCI of the lt circumflex  2000    ALLERGIES: Allergies  Allergen Reactions   Metformin And Related Nausea And Vomiting    After one pill at hospital   Januvia [Sitagliptin Phosphate] Other (See Comments)    Swelling and edema see 4 13 OV    FAMILY HISTORY:  Family History  Problem Relation Age of Onset   Heart attack Father    Diabetes Father    Heart disease Father    Diabetes Brother    Diabetes Mother    Liver cancer Mother    Hypertension Other    Thyroid disease Other        sisters- sister had parathyroidectomy   Alcohol abuse Other        brother   Colon polyps Maternal Uncle    Breast cancer Sister    Diabetes Sister    Thyroid disease Sister     SOCIAL HISTORY: Social History   Socioeconomic History   Marital status: Married    Spouse name: Not on file   Number of children: 3   Years of education: Not on file   Highest education level: Not on file  Occupational History   Occupation: housewife    Employer: UNEMPLOYED  Tobacco Use   Smoking status: Every Day    Current packs/day: 1.00    Types: Cigarettes   Smokeless tobacco: Never  Vaping Use   Vaping status: Former  Substance and Sexual Activity   Alcohol use: No   Drug  use: No   Sexual activity: Not on file  Other Topics Concern   Not on file  Social History Narrative   t widowed    Regular exercise- yes not recently       Quit tobacco in August at heart surgery. Restarted    Husband diagnosed with liver cancer since 2014 passed April 2015    She was care taker.   No  pets   Social Drivers of Corporate investment banker Strain: Low Risk  (03/20/2020)   Overall Financial Resource Strain (CARDIA)    Difficulty of Paying Living Expenses: Not hard at all  Recent Concern: Financial Resource Strain - Medium Risk (01/30/2020)   Overall Financial Resource Strain (CARDIA)    Difficulty of Paying Living Expenses: Somewhat hard  Food Insecurity: No Food Insecurity (03/10/2023)   Hunger Vital Sign    Worried About Running Out of Food in the Last Year: Never true    Ran Out of Food in the Last Year: Never true  Transportation Needs: No Transportation Needs (03/20/2020)   PRAPARE - Administrator, Civil Service (Medical): No    Lack of Transportation (Non-Medical): No  Physical Activity: Insufficiently Active (03/10/2023)   Exercise Vital Sign    Days of Exercise per Week: 3 days    Minutes of Exercise per Session: 10 min  Stress: No Stress Concern Present (03/10/2023)   [employer] of Occupational Health - Occupational Stress Questionnaire    Feeling of Stress : Not at all  Social Connections: Socially Isolated (03/10/2023)   Social Connection and Isolation Panel [NHANES]    Frequency of Communication with Friends and Family: More than three times a week    Frequency of Social Gatherings with Friends and Family: Twice a week    Attends Religious Services: Never    Database administrator or Organizations: No    Attends Banker Meetings: Never    Marital Status: Widowed    MEDICATIONS:  Current Outpatient Medications  Medication Sig Dispense Refill   Accu-Chek FastClix Lancets MISC Use as instructed to check blood sugar 4  times daily. 150 each 2   ACCU-CHEK GUIDE test strip USE ACCU CHECK GUIDE TEST STRIPS AS INSTRUCTED TO CHECK BLOOD SUGAR 4 TIMES DAILY. 100 strip 4   albuterol (VENTOLIN HFA) 108 (90 Base) MCG/ACT inhaler INHALE 2 PUFFS INTO THE LUNGS EVERY 6 HOURS AS NEEDED FOR SHORTNESS OF BREATH OR WHEEZING 6.7 each 2   amLODipine (NORVASC) 5 MG tablet Take 1 tablet (5 mg total) by mouth daily. 90 tablet 3   atorvastatin (LIPITOR) 80 MG tablet TAKE 1 TABLET BY MOUTH EVERY DAY 90 tablet 2   Blood Glucose Monitoring Suppl (ACCU-CHEK GUIDE) w/Device KIT 1 each by Does not apply route 4 (four) times daily. Use Accu Chek to check blood sugar 4 times daily. 1 kit 0   empagliflozin (JARDIANCE) 25 MG TABS tablet Take 1 tablet (25 mg total) by mouth daily before breakfast. 90 tablet 3   ezetimibe (ZETIA) 10 MG tablet TAKE 1 TABLET BY MOUTH EVERY DAY 90 tablet 2   fenofibrate (TRICOR) 145 MG tablet TAKE 1 TABLET BY MOUTH DAILY**STOP THE ZETIA 90 tablet 0   glucose blood (ACCU-CHEK GUIDE TEST) test strip Use to test blood glucose before meals and at bedtime 300 each 12   insulin aspart (NOVOLOG FLEXPEN) 100 UNIT/ML FlexPen INJECT 18 UNITS UNDER THE SKIN AT LARGEST MEAL AND 12 UNITS AT DINNER 15 mL 2   insulin degludec (TRESIBA FLEXTOUCH) 200 UNIT/ML FlexTouch Pen INJECT 42 UNITS INTO THE SKIN DAILY. 9 mL 2   Insulin Pen Needle (BD PEN NEEDLE NANO U/F) 32G X 4 MM MISC Use as instructed to inject 3 insulin 3 times daily. 100 each 3   irbesartan (AVAPRO) 300 MG tablet Take 1 tablet (300 mg total) by mouth daily. 90 tablet 1   levothyroxine (SYNTHROID) 50 MCG  tablet Take 1 tablet (50 mcg total) by mouth daily. 90 tablet 3   insulin lispro (HUMALOG KWIKPEN) 100 UNIT/ML KwikPen 14 units with lunch and 10 units with supper. (Patient not taking: Reported on 09/04/2023) 15 mL 11   Current Facility-Administered Medications  Medication Dose Route Frequency Provider Last Rate Last Admin   aspirin chewable tablet 81 mg  81 mg Oral  Daily Little Ishikawa, MD        PHYSICAL EXAM: Vitals:   09/04/23 0807 09/04/23 0808  BP: (!) 168/80 (!) 164/70  Pulse: 81   Resp: 20   SpO2: 94%   Weight: 158 lb 9.6 oz (71.9 kg)   Height: 5\' 4"  (1.626 m)    Body mass index is 27.22 kg/m.  Wt Readings from Last 3 Encounters:  09/04/23 158 lb 9.6 oz (71.9 kg)  06/02/23 158 lb (71.7 kg)  05/07/23 158 lb 12.8 oz (72 kg)    General: Well developed, well nourished female in no apparent distress.  HEENT: AT/[place], no external lesions.  Eyes: Conjunctiva clear and no icterus. Neck: Neck supple  Lungs: Respirations not labored Neurologic: Alert, oriented, normal speech Extremities / Skin: Dry. Psychiatric: Does not appear depressed or anxious  Diabetic Foot Exam - Simple   No data filed    LABS Reviewed Lab Results  Component Value Date   HGBA1C 7.3 (H) 08/31/2023   HGBA1C 7.9 (H) 04/30/2023   HGBA1C 7.4 (H) 01/30/2023   Lab Results  Component Value Date   FRUCTOSAMINE 262 02/03/2019   Lab Results  Component Value Date   CHOL 173 06/02/2023   HDL 24 (L) 06/02/2023   LDLCALC 85 06/02/2023   LDLDIRECT 77.0 08/13/2021   TRIG 391 (H) 06/02/2023   CHOLHDL 7.2 (H) 06/02/2023   Lab Results  Component Value Date   MICRALBCREAT 30.2 (H) 04/30/2023   MICRALBCREAT 62.0 (H) 06/20/2022   Lab Results  Component Value Date   CREATININE 0.83 08/31/2023   Lab Results  Component Value Date   GFR 62.71 04/30/2023    ASSESSMENT / PLAN  1. Uncontrolled type 2 diabetes mellitus with hyperglycemia, with long-term current use of insulin (HCC)   2. Acquired autoimmune hypothyroidism   3. Osteopenia of neck of right femur     Diabetes Mellitus type 2, complicated by microalbuminuria. - Diabetic status / severity: Uncontrolled.  Improving.  Lab Results  Component Value Date   HGBA1C 7.3 (H) 08/31/2023    - Hemoglobin A1c goal : <7%  With the glucometer data reviewed she has perfectly normal fasting blood sugar  and blood sugar around the evening are in acceptable range.  No significant hyperglycemia.  No significant hypoglycemia.  - Medications: No change. Tresiba U200 42 units at bedtime. NovoLog 18 units before lunch and 12 units with supper. Stop Invokana 300 mg daily due to cost and start Jardiance 25 mg daily to check cost and coverage with the pharmacy.  Discussed about decreasing the dose of NovoLog for lunch time if she is having increased physical activity or eating less food for the lungs.  - Home glucose testing: Before meals and at bedtime. - Discussed/ Gave Hypoglycemia treatment plan.  # Consult : not required at this time.   # Annual urine for microalbuminuria/ creatinine ratio, no microalbuminuria currently, continue ACE/ARB /irbesartan, Invokana. Last  Lab Results  Component Value Date   MICRALBCREAT 30.2 (H) 04/30/2023    # Foot check nightly.  # Annual dilated diabetic eye exams.   - Diet:  Make healthy diabetic food choices - Life style / activity / exercise: Discussed.  2. Blood pressure  -  BP Readings from Last 1 Encounters:  09/04/23 (!) 164/70    - Control is not in target.  - No change in current plans.  Patient has been taking hydrochlorothiazide 12.5 mg daily and irbesartan.  Patient is asked to check blood pressure at home, she has a blood pressure medicine and if remained high asked to call our clinic or primary care provider.  3. Lipid status / Hyperlipidemia - Last  Lab Results  Component Value Date   LDLCALC 85 06/02/2023   - Continue atorvastatin 80 mg daily.  Continue fenofibrate Weldon Picking.  Managed by primary care provider.  # Primary hypothyroidism -TSH normal in September.  Will continue current dose of levothyroxine 50 mcg daily.  Annual thyroid lab.  # Osteopenia -Last DEXA scan in April 2022. -Patient vitamin D is in normal -Consider repeat DEXA scan in 1 to 2 years.  Diagnoses and all orders for this visit:  Uncontrolled type 2  diabetes mellitus with hyperglycemia, with long-term current use of insulin (HCC) -     empagliflozin (JARDIANCE) 25 MG TABS tablet; Take 1 tablet (25 mg total) by mouth daily before breakfast. -     BASIC METABOLIC PANEL WITH GFR -     Hemoglobin A1c  Acquired autoimmune hypothyroidism  Osteopenia of neck of right femur     DISPOSITION Follow up in clinic in 4 months suggested.  Patient prefers to come in 4 months rather than in 3 months.   All questions answered and patient verbalized understanding of the plan.  [place] Jerrianne Hartin, MD [facility] Endocrinology [facility] [address] [place], [address] [place], [place] [address] Phone # [phone]  At least part of this note was generated using voice recognition software. Inadvertent word errors may have occurred, which were not recognized during the proofreading process.

## 2023-09-07 NOTE — Telephone Encounter (Signed)
 Noted she can only stay on insulin therapy for now, if needed we can add other cost-effective medication in the future.

## 2023-09-14 NOTE — Progress Notes (Deleted)
 No chief complaint on file.   HPI: Kimberly [name] 73 y.o.  w dx pvd ascvd dm hld ht  tobacco use  followed by endocrinology come in for Chronic disease management  6 mos check  M noted 9 24  and has had decline in renal function ROS: See pertinent positives and negatives per HPI.  Past Medical History:  Diagnosis Date   CAD (coronary artery disease)    Colitis    Colitis, indeterminate 01/31/2012   COPD (chronic obstructive pulmonary disease) (HCC)    Depression 05/16/2017   Depression with anxiety 05/30/2016   Diverticulitis 02/10/2012   DM (diabetes mellitus) (HCC)    Glaucoma    Glaucoma    History of ventricular fibrillation    HTN (hypertension)    Hyperlipidemia    Myocardial infarction (HCC)    12/1998, 01/2011   Osteoporosis    PVD (peripheral vascular disease) (HCC)     Family History  Problem Relation Age of Onset   Heart attack Father    Diabetes Father    Heart disease Father    Diabetes Brother    Diabetes Mother    Liver cancer Mother    Hypertension Other    Thyroid disease Other        sisters- sister had parathyroidectomy   Alcohol abuse Other        brother   Colon polyps Maternal Uncle    Breast cancer Sister    Diabetes Sister    Thyroid disease Sister     Social History   Socioeconomic History   Marital status: Married    Spouse name: Not on file   Number of children: 3   Years of education: Not on file   Highest education level: Not on file  Occupational History   Occupation: housewife    Employer: UNEMPLOYED  Tobacco Use   Smoking status: Every Day    Current packs/day: 1.00    Types: Cigarettes   Smokeless tobacco: Never  Vaping Use   Vaping status: Former  Substance and Sexual Activity   Alcohol use: No   Drug use: No   Sexual activity: Not on file  Other Topics Concern   Not on file  Social History Narrative   t widowed    Regular exercise- yes not recently       Quit tobacco in August at heart surgery. Restarted     Husband diagnosed with liver cancer since 2014 passed April 2015    She was care taker.   No pets   Social Drivers of Corporate investment banker Strain: Low Risk  (03/20/2020)   Overall Financial Resource Strain (CARDIA)    Difficulty of Paying Living Expenses: Not hard at all  Recent Concern: Financial Resource Strain - Medium Risk (01/30/2020)   Overall Financial Resource Strain (CARDIA)    Difficulty of Paying Living Expenses: Somewhat hard  Food Insecurity: No Food Insecurity (03/10/2023)   Hunger Vital Sign    Worried About Running Out of Food in the Last Year: Never true    Ran Out of Food in the Last Year: Never true  Transportation Needs: No Transportation Needs (03/20/2020)   PRAPARE - Administrator, Civil Service (Medical): No    Lack of Transportation (Non-Medical): No  Physical Activity: Insufficiently Active (03/10/2023)   Exercise Vital Sign    Days of Exercise per Week: 3 days    Minutes of Exercise per Session: 10 min  Stress: No Stress  Concern Present (03/10/2023)   [employer] of Occupational Health - Occupational Stress Questionnaire    Feeling of Stress : Not at all  Social Connections: Socially Isolated (03/10/2023)   Social Connection and Isolation Panel [NHANES]    Frequency of Communication with Friends and Family: More than three times a week    Frequency of Social Gatherings with Friends and Family: Twice a week    Attends Religious Services: Never    Database administrator or Organizations: No    Attends Banker Meetings: Never    Marital Status: Widowed    Outpatient Medications Prior to Visit  Medication Sig Dispense Refill   Accu-Chek FastClix Lancets MISC Use as instructed to check blood sugar 4 times daily. 150 each 2   ACCU-CHEK GUIDE test strip USE ACCU CHECK GUIDE TEST STRIPS AS INSTRUCTED TO CHECK BLOOD SUGAR 4 TIMES DAILY. 100 strip 4   albuterol (VENTOLIN HFA) 108 (90 Base) MCG/ACT inhaler INHALE 2 PUFFS INTO  THE LUNGS EVERY 6 HOURS AS NEEDED FOR SHORTNESS OF BREATH OR WHEEZING 6.7 each 2   amLODipine (NORVASC) 5 MG tablet Take 1 tablet (5 mg total) by mouth daily. 90 tablet 3   atorvastatin (LIPITOR) 80 MG tablet TAKE 1 TABLET BY MOUTH EVERY DAY 90 tablet 2   Blood Glucose Monitoring Suppl (ACCU-CHEK GUIDE) w/Device KIT 1 each by Does not apply route 4 (four) times daily. Use Accu Chek to check blood sugar 4 times daily. 1 kit 0   empagliflozin (JARDIANCE) 25 MG TABS tablet Take 1 tablet (25 mg total) by mouth daily before breakfast. 90 tablet 3   ezetimibe (ZETIA) 10 MG tablet TAKE 1 TABLET BY MOUTH EVERY DAY 90 tablet 2   fenofibrate (TRICOR) 145 MG tablet TAKE 1 TABLET BY MOUTH DAILY**STOP THE ZETIA 90 tablet 0   glucose blood (ACCU-CHEK GUIDE TEST) test strip Use to test blood glucose before meals and at bedtime 300 each 12   insulin aspart (NOVOLOG FLEXPEN) 100 UNIT/ML FlexPen INJECT 18 UNITS UNDER THE SKIN AT LARGEST MEAL AND 12 UNITS AT DINNER 15 mL 2   insulin degludec (TRESIBA FLEXTOUCH) 200 UNIT/ML FlexTouch Pen INJECT 42 UNITS INTO THE SKIN DAILY. 9 mL 2   insulin lispro (HUMALOG KWIKPEN) 100 UNIT/ML KwikPen 14 units with lunch and 10 units with supper. (Patient not taking: Reported on 09/04/2023) 15 mL 11   Insulin Pen Needle (BD PEN NEEDLE NANO U/F) 32G X 4 MM MISC Use as instructed to inject 3 insulin 3 times daily. 100 each 3   irbesartan (AVAPRO) 300 MG tablet Take 1 tablet (300 mg total) by mouth daily. 90 tablet 1   levothyroxine (SYNTHROID) 50 MCG tablet Take 1 tablet (50 mcg total) by mouth daily. 90 tablet 3   Facility-Administered Medications Prior to Visit  Medication Dose Route Frequency Provider Last Rate Last Admin   aspirin chewable tablet 81 mg  81 mg Oral Daily Little Ishikawa, MD         EXAM:  There were no vitals taken for this visit.  There is no height or weight on file to calculate BMI.  GENERAL: vitals reviewed and listed above, alert, oriented,  appears well hydrated and in no acute distress HEENT: atraumatic, conjunctiva  clear, no obvious abnormalities on inspection of external nose and ears OP : no lesion edema or exudate  NECK: no obvious masses on inspection palpation  LUNGS: clear to auscultation bilaterally, no wheezes, rales or rhonchi, good air movement  CV: HRRR, no clubbing cyanosis or  peripheral edema nl cap refill  MS: moves all extremities without noticeable focal  abnormality PSYCH: pleasant and cooperative, no obvious depression or anxiety Lab Results  Component Value Date   WBC 6.9 03/10/2023   HGB 16.5 (H) 03/10/2023   HCT 50.2 (H) 03/10/2023   PLT 192.0 03/10/2023   GLUCOSE 93 08/31/2023   CHOL 173 06/02/2023   TRIG 391 (H) 06/02/2023   HDL 24 (L) 06/02/2023   LDLDIRECT 77.0 08/13/2021   LDLCALC 85 06/02/2023   ALT 25 04/30/2023   AST 26 04/30/2023   NA 136 08/31/2023   K 4.7 08/31/2023   CL 99 08/31/2023   CREATININE 0.83 08/31/2023   BUN 18 08/31/2023   CO2 26 08/31/2023   TSH 3.72 03/10/2023   INR 1.06 02/01/2012   HGBA1C 7.3 (H) 08/31/2023   MICROALBUR 13.1 (H) 04/30/2023   BP Readings from Last 3 Encounters:  09/04/23 (!) 164/70  05/07/23 (!) 152/80  03/10/23 136/80   See last echo  calcium av  nl lv ef but hypokinesis and elevated rv pressure  ASSESSMENT AND PLAN:  Discussed the following assessment and plan:  Medication management  PERIPHERAL VASCULAR DISEASE  Hyperlipidemia associated with type 2 diabetes mellitus (HCC)  Tobacco use  Atherosclerosis of coronary artery bypass graft of native heart without angina pectoris  SYSTOLIC MURMUR May need to see cards for help with manamgnet  -Patient advised to return or notify health care team  if  new concerns arise.  There are no Patient Instructions on file for this visit.   Neta Mends. Lucus Lambertson M.D.

## 2023-09-15 ENCOUNTER — Ambulatory Visit: Payer: Medicare HMO | Admitting: Internal Medicine

## 2023-09-15 DIAGNOSIS — Z79899 Other long term (current) drug therapy: Secondary | ICD-10-CM

## 2023-09-15 DIAGNOSIS — I739 Peripheral vascular disease, unspecified: Secondary | ICD-10-CM

## 2023-09-15 DIAGNOSIS — E1169 Type 2 diabetes mellitus with other specified complication: Secondary | ICD-10-CM

## 2023-09-15 DIAGNOSIS — Z720 Tobacco use: Secondary | ICD-10-CM

## 2023-09-15 DIAGNOSIS — I10 Essential (primary) hypertension: Secondary | ICD-10-CM

## 2023-09-15 DIAGNOSIS — I25810 Atherosclerosis of coronary artery bypass graft(s) without angina pectoris: Secondary | ICD-10-CM

## 2023-09-15 DIAGNOSIS — R011 Cardiac murmur, unspecified: Secondary | ICD-10-CM

## 2023-09-30 ENCOUNTER — Other Ambulatory Visit: Payer: Self-pay | Admitting: Internal Medicine

## 2023-09-30 ENCOUNTER — Ambulatory Visit: Admitting: Internal Medicine

## 2023-09-30 ENCOUNTER — Encounter: Payer: Self-pay | Admitting: Internal Medicine

## 2023-09-30 VITALS — BP 156/68 | HR 87 | Temp 98.1°F | Wt 158.8 lb

## 2023-09-30 DIAGNOSIS — J449 Chronic obstructive pulmonary disease, unspecified: Secondary | ICD-10-CM

## 2023-09-30 DIAGNOSIS — I10 Essential (primary) hypertension: Secondary | ICD-10-CM | POA: Diagnosis not present

## 2023-09-30 DIAGNOSIS — Z720 Tobacco use: Secondary | ICD-10-CM

## 2023-09-30 DIAGNOSIS — E785 Hyperlipidemia, unspecified: Secondary | ICD-10-CM | POA: Diagnosis not present

## 2023-09-30 DIAGNOSIS — Z79899 Other long term (current) drug therapy: Secondary | ICD-10-CM | POA: Diagnosis not present

## 2023-09-30 DIAGNOSIS — I739 Peripheral vascular disease, unspecified: Secondary | ICD-10-CM

## 2023-09-30 DIAGNOSIS — E1169 Type 2 diabetes mellitus with other specified complication: Secondary | ICD-10-CM

## 2023-09-30 DIAGNOSIS — Z6379 Other stressful life events affecting family and household: Secondary | ICD-10-CM | POA: Diagnosis not present

## 2023-09-30 DIAGNOSIS — D751 Secondary polycythemia: Secondary | ICD-10-CM

## 2023-09-30 DIAGNOSIS — E1151 Type 2 diabetes mellitus with diabetic peripheral angiopathy without gangrene: Secondary | ICD-10-CM

## 2023-09-30 NOTE — Progress Notes (Signed)
 Future labs to b done pre visit in fall

## 2023-09-30 NOTE — Progress Notes (Signed)
 Chief Complaint  Patient presents with   Medical Management of Chronic Issues    HPI: Kimberly [name] 73 y.o. come in for Chronic disease management   Saw cards  12 2024 dr Ortencia Kick and referred to  pharamcy cards to clarify medicaiton and bp control  But   the issues was  verbal brand vs generic names  so has list of meds . Has fu in July  .   DM :dr Erroll Luna  3 21  a 1 c is 7.3   HLD PVD CAD Atorva   and zetia and albuterol  is per pcp team at this time  Recently invokanna changed to jardiance   Says  breathing is fine still walks for stress .  Says feet ok a corn no ulcers or numbness  claudication.  Family imedical  causing some stress  :one son has had I? In 4s  another has "hole in heart " and will need surgery . Tbd [facility].   ROS: See pertinent positives and negatives per HPI.  Past Medical History:  Diagnosis Date   CAD (coronary artery disease)    Colitis    Colitis, indeterminate 01/31/2012   COPD (chronic obstructive pulmonary disease) (HCC)    Depression 05/16/2017   Depression with anxiety 05/30/2016   Diverticulitis 02/10/2012   DM (diabetes mellitus) (HCC)    Glaucoma    Glaucoma    History of ventricular fibrillation    HTN (hypertension)    Hyperlipidemia    Myocardial infarction (HCC)    12/1998, 01/2011   Osteoporosis    PVD (peripheral vascular disease) (HCC)     Family History  Problem Relation Age of Onset   Heart attack Father    Diabetes Father    Heart disease Father    Diabetes Brother    Diabetes Mother    Liver cancer Mother    Hypertension Other    Thyroid disease Other        sisters- sister had parathyroidectomy   Alcohol abuse Other        brother   Colon polyps Maternal Uncle    Breast cancer Sister    Diabetes Sister    Thyroid disease Sister     Social History   Socioeconomic History   Marital status: Married    Spouse name: Not on file   Number of children: 3   Years of education: Not on file   Highest education  level: Not on file  Occupational History   Occupation: housewife    Employer: UNEMPLOYED  Tobacco Use   Smoking status: Every Day    Current packs/day: 1.00    Types: Cigarettes   Smokeless tobacco: Never  Vaping Use   Vaping status: Former  Substance and Sexual Activity   Alcohol use: No   Drug use: No   Sexual activity: Not on file  Other Topics Concern   Not on file  Social History Narrative   t widowed    Regular exercise- yes not recently       Quit tobacco in August at heart surgery. Restarted    Husband diagnosed with liver cancer since 2014 passed April 2015    She was care taker.   No pets   Social Drivers of Corporate investment banker Strain: Low Risk  (03/20/2020)   Overall Financial Resource Strain (CARDIA)    Difficulty of Paying Living Expenses: Not hard at all  Recent Concern: Financial Resource Strain - Medium Risk (01/30/2020)  Overall Financial Resource Strain (CARDIA)    Difficulty of Paying Living Expenses: Somewhat hard  Food Insecurity: No Food Insecurity (03/10/2023)   Hunger Vital Sign    Worried About Running Out of Food in the Last Year: Never true    Ran Out of Food in the Last Year: Never true  Transportation Needs: No Transportation Needs (03/20/2020)   PRAPARE - Administrator, Civil Service (Medical): No    Lack of Transportation (Non-Medical): No  Physical Activity: Insufficiently Active (03/10/2023)   Exercise Vital Sign    Days of Exercise per Week: 3 days    Minutes of Exercise per Session: 10 min  Stress: No Stress Concern Present (03/10/2023)   [employer] of Occupational Health - Occupational Stress Questionnaire    Feeling of Stress : Not at all  Social Connections: Socially Isolated (03/10/2023)   Social Connection and Isolation Panel [NHANES]    Frequency of Communication with Friends and Family: More than three times a week    Frequency of Social Gatherings with Friends and Family: Twice a week    Attends  Religious Services: Never    Database administrator or Organizations: No    Attends Banker Meetings: Never    Marital Status: Widowed    Outpatient Medications Prior to Visit  Medication Sig Dispense Refill   Accu-Chek FastClix Lancets MISC Use as instructed to check blood sugar 4 times daily. 150 each 2   ACCU-CHEK GUIDE test strip USE ACCU CHECK GUIDE TEST STRIPS AS INSTRUCTED TO CHECK BLOOD SUGAR 4 TIMES DAILY. 100 strip 4   albuterol (VENTOLIN HFA) 108 (90 Base) MCG/ACT inhaler INHALE 2 PUFFS INTO THE LUNGS EVERY 6 HOURS AS NEEDED FOR SHORTNESS OF BREATH OR WHEEZING 6.7 each 2   amLODipine (NORVASC) 5 MG tablet Take 1 tablet (5 mg total) by mouth daily. 90 tablet 3   atorvastatin (LIPITOR) 80 MG tablet TAKE 1 TABLET BY MOUTH EVERY DAY 90 tablet 2   Blood Glucose Monitoring Suppl (ACCU-CHEK GUIDE) w/Device KIT 1 each by Does not apply route 4 (four) times daily. Use Accu Chek to check blood sugar 4 times daily. 1 kit 0   empagliflozin (JARDIANCE) 25 MG TABS tablet Take 1 tablet (25 mg total) by mouth daily before breakfast. 90 tablet 3   ezetimibe (ZETIA) 10 MG tablet TAKE 1 TABLET BY MOUTH EVERY DAY 90 tablet 2   fenofibrate (TRICOR) 145 MG tablet TAKE 1 TABLET BY MOUTH DAILY**STOP THE ZETIA 90 tablet 0   glucose blood (ACCU-CHEK GUIDE TEST) test strip Use to test blood glucose before meals and at bedtime 300 each 12   insulin aspart (NOVOLOG FLEXPEN) 100 UNIT/ML FlexPen INJECT 18 UNITS UNDER THE SKIN AT LARGEST MEAL AND 12 UNITS AT DINNER 15 mL 2   insulin degludec (TRESIBA FLEXTOUCH) 200 UNIT/ML FlexTouch Pen INJECT 42 UNITS INTO THE SKIN DAILY. 9 mL 2   Insulin Pen Needle (BD PEN NEEDLE NANO U/F) 32G X 4 MM MISC Use as instructed to inject 3 insulin 3 times daily. 100 each 3   irbesartan (AVAPRO) 300 MG tablet Take 1 tablet (300 mg total) by mouth daily. 90 tablet 1   levothyroxine (SYNTHROID) 50 MCG tablet Take 1 tablet (50 mcg total) by mouth daily. 90 tablet 3   insulin  lispro (HUMALOG KWIKPEN) 100 UNIT/ML KwikPen 14 units with lunch and 10 units with supper. 15 mL 11   Facility-Administered Medications Prior to Visit  Medication Dose Route Frequency  Provider Last Rate Last Admin   aspirin chewable tablet 81 mg  81 mg Oral Daily Little Ishikawa, MD         EXAM:  BP (!) 156/68 (BP Location: Right Arm, Patient Position: Sitting)   Pulse 87   Temp 98.1 F (36.7 C) (Oral)   Wt 158 lb 12.8 oz (72 kg)   SpO2 95%   BMI 27.26 kg/m   Body mass index is 27.26 kg/m.  GENERAL: vitals reviewed and listed above, alert, oriented, appears well hydrated and in no acute distress HEENT: atraumatic, conjunctiva  clear, no obvious abnormalities on inspection of external nose and ears  NECK: no obvious masses on inspection palpation  LUNGS:  no wheezes, rales or rhonchi,dec air movement but decent flos  one area of tubular sound at base  CV: HRRR, systolic m 2/6 upper chest  lsb , no clubbing cyanosis or  peripheral edema nl cap refill  dp pulses are = MS: moves all extremities without noticeable focal  abnormality PSYCH: pleasant and cooperative, no obvious depression or anxiety cognition appear intact  Lab Results  Component Value Date   WBC 6.9 03/10/2023   HGB 16.5 (H) 03/10/2023   HCT 50.2 (H) 03/10/2023   PLT 192.0 03/10/2023   GLUCOSE 93 08/31/2023   CHOL 173 06/02/2023   TRIG 391 (H) 06/02/2023   HDL 24 (L) 06/02/2023   LDLDIRECT 77.0 08/13/2021   LDLCALC 85 06/02/2023   ALT 25 04/30/2023   AST 26 04/30/2023   NA 136 08/31/2023   K 4.7 08/31/2023   CL 99 08/31/2023   CREATININE 0.83 08/31/2023   BUN 18 08/31/2023   CO2 26 08/31/2023   TSH 3.72 03/10/2023   INR 1.06 02/01/2012   HGBA1C 7.3 (H) 08/31/2023   MICROALBUR 13.1 (H) 04/30/2023   BP Readings from Last 3 Encounters:  09/30/23 (!) 156/68  09/04/23 (!) 164/70  05/07/23 (!) 152/80   DEXA scan, most current DEXA scan T-score is minus 1.3.  ASSESSMENT AND PLAN:  Discussed  the following assessment and plan:  Hyperlipidemia associated with type 2 diabetes mellitus (HCC) - last check 12 24  Essential hypertension  Tobacco use  Chronic obstructive pulmonary disease, unspecified COPD type (HCC)  Stress due to illness of family member  Medication management  PERIPHERAL VASCULAR DISEASE Family illness stress  aortic stenosis above  Check readings and send in ( doesn't use my chart will mail in?)  Still advised and aware to dc tobacco . Plan lab in 6+ months and then visit  ( can adapt if other in team order labs )  future orders placed  -Patient advised to return or notify health care team  if  new concerns arise.  Patient Instructions  Good to see you. Today .  Bp is up today  Check BP reading  twice a day  2 x per sitting  for 3-5 days in a row and send in readings .  Avoid  tobacco caffine before  bp readings .  We can share info with cardiology .  Labs for cholesterol due in    December or fall .  Plan  fu about 6 months    lab pre visit   to include . cholesterol .      Neta Mends. Nyella Eckels M.D.

## 2023-09-30 NOTE — Patient Instructions (Addendum)
 Good to see you. Today .  Bp is up today  Check BP reading  twice a day  2 x per sitting  for 3-5 days in a row and send in readings .  Avoid  tobacco caffiene before  bp readings .  We can share info with cardiology .  Labs for cholesterol due in    December or fall .  Plan  fu about 6 months    lab pre visit   to include . cholesterol .

## 2023-11-05 ENCOUNTER — Other Ambulatory Visit: Payer: Self-pay | Admitting: Endocrinology

## 2023-11-05 DIAGNOSIS — E782 Mixed hyperlipidemia: Secondary | ICD-10-CM

## 2023-11-17 ENCOUNTER — Other Ambulatory Visit: Payer: Self-pay | Admitting: Endocrinology

## 2023-11-17 DIAGNOSIS — I10 Essential (primary) hypertension: Secondary | ICD-10-CM

## 2023-11-18 ENCOUNTER — Other Ambulatory Visit: Payer: Self-pay | Admitting: Endocrinology

## 2023-11-18 ENCOUNTER — Other Ambulatory Visit: Payer: Self-pay

## 2023-11-18 DIAGNOSIS — E1165 Type 2 diabetes mellitus with hyperglycemia: Secondary | ICD-10-CM

## 2023-11-18 NOTE — Telephone Encounter (Signed)
Novolog refill request complete

## 2023-11-27 ENCOUNTER — Other Ambulatory Visit: Payer: Self-pay | Admitting: Endocrinology

## 2023-11-27 DIAGNOSIS — E1165 Type 2 diabetes mellitus with hyperglycemia: Secondary | ICD-10-CM

## 2023-12-06 NOTE — Progress Notes (Unsigned)
 Cardiology Office Note:    Date:  12/07/2023   ID:  Kimberly [name], DOB [birth year], MRN [id number]  PCP:  Charlett Apolinar POUR, MD  Cardiologist:  None  Electrophysiologist:  None   Referring MD: Charlett Apolinar POUR, MD   Chief Complaint  Patient presents with   Coronary Artery Disease    History of Present Illness:    Kimberly [name] is a 73 y.o. female with a hx of CAD, COPD, T2DM, PAD status post aortobifemoral bypass in 2008 who is referred by Dr. Charlett for evaluation of orthostatic hypotension and CAD.  She previously followed with Dr. Delford in cardiology, last seen in 2017.  She had a history of MI and V-fib arrest with PCI to LCx in 2000.  She presented with chest pain in 2012 and found to have severe multivessel disease, underwent CABG x 3 (LIMA-LAD, SVG-OM, SVG-RCA).  Echocardiogram 04/02/2023 showed EF 65 to 70%, grade 1 diastolic dysfunction, mild RV dysfunction, no significant valvular disease.  Since last clinic visit, she reports he is doing well.  Denies any chest pain, dyspnea, lightheadedness, syncope, or palpitations.  Mild swelling in left leg.  Still smoking 1ppd.     Past Medical History:  Diagnosis Date   CAD (coronary artery disease)    Colitis    Colitis, indeterminate 01/31/2012   COPD (chronic obstructive pulmonary disease) (HCC)    Depression 05/16/2017   Depression with anxiety 05/30/2016   Diverticulitis 02/10/2012   DM (diabetes mellitus) (HCC)    Glaucoma    Glaucoma    History of ventricular fibrillation    HTN (hypertension)    Hyperlipidemia    Myocardial infarction (HCC)    12/1998, 01/2011   Osteoporosis    PVD (peripheral vascular disease) (HCC)     Past Surgical History:  Procedure Laterality Date   ABDOMINAL HYSTERECTOMY     endometriosis   CABG x 3  01/22/2011   Dr Lucas   CARDIAC CATHETERIZATION  01/17/2011   Dr Patrisha   LIPOMA EXCISION Left 02/08/2015   Procedure: EXCISION OF LEFT THIGH LIPOMA;  Surgeon: Camellia Blush, MD;  Location:   [facility];  Service: General;  Laterality: Left;   S/P aortobifemoral bypass  2008   Dr Oris   S/P myocardial infarction and PCI of the lt circumflex  2000    Current Medications: Current Meds  Medication Sig   Accu-Chek FastClix Lancets MISC Use as instructed to check blood sugar 4 times daily.   ACCU-CHEK GUIDE test strip USE ACCU CHECK GUIDE TEST STRIPS AS INSTRUCTED TO CHECK BLOOD SUGAR 4 TIMES DAILY.   albuterol  (VENTOLIN  HFA) 108 (90 Base) MCG/ACT inhaler INHALE 2 PUFFS INTO THE LUNGS EVERY 6 HOURS AS NEEDED FOR SHORTNESS OF BREATH OR WHEEZING   amLODipine  (NORVASC ) 10 MG tablet Take 1 tablet (10 mg total) by mouth daily.   atorvastatin  (LIPITOR ) 80 MG tablet TAKE 1 TABLET BY MOUTH EVERY DAY   Blood Glucose Monitoring Suppl (ACCU-CHEK GUIDE) w/Device KIT 1 each by Does not apply route 4 (four) times daily. Use Accu Chek to check blood sugar 4 times daily.   empagliflozin  (JARDIANCE ) 25 MG TABS tablet Take 1 tablet (25 mg total) by mouth daily before breakfast.   ezetimibe  (ZETIA ) 10 MG tablet TAKE 1 TABLET BY MOUTH EVERY DAY   fenofibrate  (TRICOR ) 145 MG tablet TAKE 1 TABLET BY MOUTH DAILY**STOP THE ZETIA    glucose blood (ACCU-CHEK GUIDE TEST) test strip Use to test blood glucose before meals and  at bedtime   insulin  aspart (NOVOLOG  FLEXPEN) 100 UNIT/ML FlexPen INJECT 18 UNITS UNDER THE SKIN AT LARGEST MEAL AND 12 UNITS AT DINNER   insulin  degludec (TRESIBA  FLEXTOUCH) 200 UNIT/ML FlexTouch Pen INJECT 42 UNITS INTO THE SKIN DAILY.   Insulin  Pen Needle (BD PEN NEEDLE NANO U/F) 32G X 4 MM MISC Use as instructed to inject 3 insulin  3 times daily.   irbesartan  (AVAPRO ) 300 MG tablet Take 1 tablet (300 mg total) by mouth daily.   levothyroxine  (SYNTHROID ) 50 MCG tablet Take 1 tablet (50 mcg total) by mouth daily.   [DISCONTINUED] amLODipine  (NORVASC ) 5 MG tablet Take 1 tablet (5 mg total) by mouth daily.   Current Facility-Administered Medications for the 12/07/23 encounter  (Office Visit) with Kate Lonni CROME, MD  Medication   aspirin  chewable tablet 81 mg     Allergies:   Metformin  and related and Januvia  [sitagliptin  phosphate]   Social History   Socioeconomic History   Marital status: Married    Spouse name: Not on file   Number of children: 3   Years of education: Not on file   Highest education level: Not on file  Occupational History   Occupation: housewife    Employer: UNEMPLOYED  Tobacco Use   Smoking status: Every Day    Current packs/day: 1.00    Types: Cigarettes   Smokeless tobacco: Never  Vaping Use   Vaping status: Former  Substance and Sexual Activity   Alcohol use: No   Drug use: No   Sexual activity: Not on file  Other Topics Concern   Not on file  Social History Narrative   t widowed    Regular exercise- yes not recently       Quit tobacco in August at heart surgery. Restarted    Husband diagnosed with liver cancer since 2014 passed April 2015    She was care taker.   No pets   Social Drivers of Corporate investment banker Strain: Low Risk  (03/20/2020)   Overall Financial Resource Strain (CARDIA)    Difficulty of Paying Living Expenses: Not hard at all  Recent Concern: Financial Resource Strain - Medium Risk (01/30/2020)   Overall Financial Resource Strain (CARDIA)    Difficulty of Paying Living Expenses: Somewhat hard  Food Insecurity: No Food Insecurity (03/10/2023)   Hunger Vital Sign    Worried About Running Out of Food in the Last Year: Never true    Ran Out of Food in the Last Year: Never true  Transportation Needs: No Transportation Needs (03/20/2020)   PRAPARE - Administrator, Civil Service (Medical): No    Lack of Transportation (Non-Medical): No  Physical Activity: Insufficiently Active (03/10/2023)   Exercise Vital Sign    Days of Exercise per Week: 3 days    Minutes of Exercise per Session: 10 min  Stress: No Stress Concern Present (03/10/2023)   [employer] of Occupational  Health - Occupational Stress Questionnaire    Feeling of Stress : Not at all  Social Connections: Socially Isolated (03/10/2023)   Social Connection and Isolation Panel    Frequency of Communication with Friends and Family: More than three times a week    Frequency of Social Gatherings with Friends and Family: Twice a week    Attends Religious Services: Never    Database administrator or Organizations: No    Attends Banker Meetings: Never    Marital Status: Widowed     Family History: The  patient's family history includes Alcohol abuse in an other family member; Breast cancer in her sister; Colon polyps in her maternal uncle; Diabetes in her brother, father, mother, and sister; Heart attack in her father; Heart disease in her father; Hypertension in an other family member; Liver cancer in her mother; Thyroid  disease in her sister and another family member.  ROS:   Please see the history of present illness.     All other systems reviewed and are negative.  EKGs/Labs/Other Studies Reviewed:    The following studies were reviewed today:   EKG:   06/02/23: NSR, nonspecific T wave flattening, rate 87 12/07/2023: Normal sinus rhythm, rate 76, no ST abnormalities  Recent Labs: 03/10/2023: Hemoglobin 16.5; Platelets 192.0; TSH 3.72 04/30/2023: ALT 25 06/02/2023: Magnesium 1.9 08/31/2023: BUN 18; Creat 0.83; Potassium 4.7; Sodium 136  Recent Lipid Panel    Component Value Date/Time   CHOL 173 06/02/2023 0916   TRIG 391 (H) 06/02/2023 0916   HDL 24 (L) 06/02/2023 0916   CHOLHDL 7.2 (H) 06/02/2023 0916   CHOLHDL 5 03/10/2023 1008   VLDL 42.0 (H) 03/10/2023 1008   LDLCALC 85 06/02/2023 0916   LDLDIRECT 77.0 08/13/2021 0814    Physical Exam:    VS:  BP (!) 158/70   Pulse 76   Ht 5' 4 (1.626 m)   Wt 162 lb 12.8 oz (73.8 kg)   SpO2 95%   BMI 27.94 kg/m     Wt Readings from Last 3 Encounters:  12/07/23 162 lb 12.8 oz (73.8 kg)  09/30/23 158 lb 12.8 oz (72 kg)   09/04/23 158 lb 9.6 oz (71.9 kg)     GEN:  Well nourished, well developed in no acute distress HEENT: Normal NECK: No JVD; No carotid bruits LYMPHATICS: No lymphadenopathy CARDIAC: RRR, no murmurs, rubs, gallops RESPIRATORY:  Clear to auscultation without rales, wheezing or rhonchi  ABDOMEN: Soft, non-tender, non-distended MUSCULOSKELETAL:  No edema; No deformity  SKIN: Warm and dry NEUROLOGIC:  Alert and oriented x 3 PSYCHIATRIC:  Normal affect   ASSESSMENT:    1. Coronary artery disease involving native coronary artery of native heart without angina pectoris   2. History of ventricular fibrillation   3. Essential hypertension   4. Hyperlipidemia, unspecified hyperlipidemia type   5. Tobacco use     PLAN:    CAD: history of MI and V-fib arrest with PCI to LCx in 2000.  She presented with chest pain in 2012 and found to have severe multivessel disease, underwent CABG x 3 (LIMA-LAD, SVG-OM, SVG-RCA).  Echocardiogram 04/02/2023 showed EF 65 to 70%, grade 1 diastolic dysfunction, mild RV dysfunction, no significant valvular disease. -Continue aspirin  81 mg daily -Continue atorvastatin  80 mg daily and Zetia  10 mg daily   Hypertension: On irbesartan  300 mg daily and amlodipine  5 mg.  BP elevated, will increase amlodipine  to 10 mg daily.  Asked to check BP daily for next 2 weeks and let us  know results.  Hyperlipidemia: On atorvastatin  80 mg daily, Zetia  10 mg daily, fenofibrate  145 mg daily.  Update lipid panel  T2DM: On insulin .  Hemoglobin A1c 7.3 on 08/31/23  Tobacco use: Counseled on the risk of tobacco use and cessation strongly encouraged.  She states he is not interested in quitting at this time  RTC in 6 months   Medication Adjustments/Labs and Tests Ordered: Current medicines are reviewed at length with the patient today.  Concerns regarding medicines are outlined above.  Orders Placed This Encounter  Procedures  Lipid panel   Basic Metabolic Panel (BMET)   EKG  12-Lead   Meds ordered this encounter  Medications   amLODipine  (NORVASC ) 10 MG tablet    Sig: Take 1 tablet (10 mg total) by mouth daily.    Dispense:  90 tablet    Refill:  3    Patient Instructions  Medication Instructions:  Increase Amlodipine  10 mg daily Continue all current medications *If you need a refill on your cardiac medications before your next appointment, please call your pharmacy*  Lab Work: Bmet, Lipid today If you have labs (blood work) drawn today and your tests are completely normal, you will receive your results only by: MyChart Message (if you have MyChart) OR A paper copy in the mail If you have any lab test that is abnormal or we need to change your treatment, we will call you to review the results.  Testing/Procedures: none  Follow-Up: At [facility], you and your health needs are our priority.  As part of our continuing mission to provide you with exceptional heart care, our providers are all part of one team.  This team includes your primary Cardiologist (physician) and Advanced Practice Providers or APPs (Physician Assistants and Nurse Practitioners) who all work together to provide you with the care you need, when you need it.  Your next appointment:   6 month(s)  Provider:   Dr. Kate  We recommend signing up for the patient portal called MyChart.  Sign up information is provided on this After Visit Summary.  MyChart is used to connect with patients for Virtual Visits (Telemedicine).  Patients are able to view lab/test results, encounter notes, upcoming appointments, etc.  Non-urgent messages can be sent to your provider as well.   To learn more about what you can do with MyChart, go to [email or web address].   Other Instructions Please check blood pressure daily for next 2 weeks and send those reading or call the office       Signed, Lonni LITTIE Kate, MD  12/07/2023 5:52 PM    [facility]

## 2023-12-07 ENCOUNTER — Encounter: Payer: Self-pay | Admitting: Cardiology

## 2023-12-07 ENCOUNTER — Ambulatory Visit: Payer: Medicare HMO | Attending: Cardiology | Admitting: Cardiology

## 2023-12-07 VITALS — BP 158/70 | HR 76 | Ht 64.0 in | Wt 162.8 lb

## 2023-12-07 DIAGNOSIS — I2510 Atherosclerotic heart disease of native coronary artery without angina pectoris: Secondary | ICD-10-CM

## 2023-12-07 DIAGNOSIS — E785 Hyperlipidemia, unspecified: Secondary | ICD-10-CM | POA: Diagnosis not present

## 2023-12-07 DIAGNOSIS — I10 Essential (primary) hypertension: Secondary | ICD-10-CM | POA: Diagnosis not present

## 2023-12-07 DIAGNOSIS — Z720 Tobacco use: Secondary | ICD-10-CM

## 2023-12-07 DIAGNOSIS — Z8679 Personal history of other diseases of the circulatory system: Secondary | ICD-10-CM | POA: Diagnosis not present

## 2023-12-07 LAB — BASIC METABOLIC PANEL WITH GFR
BUN/Creatinine Ratio: 22 (ref 12–28)
BUN: 20 mg/dL (ref 8–27)
CO2: 19 mmol/L — ABNORMAL LOW (ref 20–29)
Calcium: 10.2 mg/dL (ref 8.7–10.3)
Chloride: 101 mmol/L (ref 96–106)
Creatinine, Ser: 0.9 mg/dL (ref 0.57–1.00)
Glucose: 219 mg/dL — ABNORMAL HIGH (ref 70–99)
Potassium: 4.9 mmol/L (ref 3.5–5.2)
Sodium: 137 mmol/L (ref 134–144)
eGFR: 68 mL/min/{1.73_m2} (ref >59)

## 2023-12-07 LAB — LIPID PANEL
Chol/HDL Ratio: 6.9 ratio — ABNORMAL HIGH (ref 0.0–4.4)
Cholesterol, Total: 180 mg/dL (ref 100–199)
HDL: 26 mg/dL — ABNORMAL LOW (ref >39)
LDL Chol Calc (NIH): 101 mg/dL — ABNORMAL HIGH (ref 0–99)
Triglycerides: 311 mg/dL — ABNORMAL HIGH (ref 0–149)
VLDL Cholesterol Cal: 53 mg/dL — ABNORMAL HIGH (ref 5–40)

## 2023-12-07 MED ORDER — AMLODIPINE BESYLATE 10 MG PO TABS: 10.0000 mg | ORAL_TABLET | Freq: Every day | ORAL | 3 refills | Status: AC

## 2023-12-07 NOTE — Patient Instructions (Signed)
 Medication Instructions:  Increase Amlodipine  10 mg daily Continue all current medications *If you need a refill on your cardiac medications before your next appointment, please call your pharmacy*  Lab Work: Bmet, Lipid today If you have labs (blood work) drawn today and your tests are completely normal, you will receive your results only by: MyChart Message (if you have MyChart) OR A paper copy in the mail If you have any lab test that is abnormal or we need to change your treatment, we will call you to review the results.  Testing/Procedures: none  Follow-Up: At [facility], you and your health needs are our priority.  As part of our continuing mission to provide you with exceptional heart care, our providers are all part of one team.  This team includes your primary Cardiologist (physician) and Advanced Practice Providers or APPs (Physician Assistants and Nurse Practitioners) who all work together to provide you with the care you need, when you need it.  Your next appointment:   6 month(s)  Provider:   Dr. Kate  We recommend signing up for the patient portal called MyChart.  Sign up information is provided on this After Visit Summary.  MyChart is used to connect with patients for Virtual Visits (Telemedicine).  Patients are able to view lab/test results, encounter notes, upcoming appointments, etc.  Non-urgent messages can be sent to your provider as well.   To learn more about what you can do with MyChart, go to [email or web address].   Other Instructions Please check blood pressure daily for next 2 weeks and send those reading or call the office

## 2023-12-08 ENCOUNTER — Ambulatory Visit: Payer: Self-pay | Admitting: Cardiology

## 2024-01-01 ENCOUNTER — Ambulatory Visit (HOSPITAL_COMMUNITY)
Admission: EM | Admit: 2024-01-01 | Discharge: 2024-01-01 | Disposition: A | Discharge status: Home / Self Care | Attending: Family Medicine | Admitting: Family Medicine

## 2024-01-01 ENCOUNTER — Other Ambulatory Visit: Payer: Self-pay | Admitting: Family

## 2024-01-01 ENCOUNTER — Encounter (HOSPITAL_COMMUNITY): Payer: Self-pay

## 2024-01-01 DIAGNOSIS — M545 Low back pain, unspecified: Secondary | ICD-10-CM

## 2024-01-01 MED ORDER — KETOROLAC TROMETHAMINE 30 MG/ML IJ SOLN
INTRAMUSCULAR | Status: AC
  Filled 2024-01-01: qty 1

## 2024-01-01 MED ORDER — PREDNISONE 20 MG PO TABS: 40.0000 mg | ORAL_TABLET | Freq: Every day | ORAL | 0 refills | Status: AC

## 2024-01-01 MED ORDER — KETOROLAC TROMETHAMINE 30 MG/ML IJ SOLN
15.0000 mg | Freq: Once | INTRAMUSCULAR | Status: AC
  Administered 2024-01-01: 15 mg via INTRAMUSCULAR

## 2024-01-01 MED ORDER — BACLOFEN 5 MG PO TABS: 5.0000 mg | ORAL_TABLET | Freq: Two times a day (BID) | ORAL | 0 refills | Status: DC | PRN

## 2024-01-01 NOTE — ED Provider Notes (Signed)
 [facility]    CSN: [id number] Arrival date & time: 01/01/24  1413      History   Chief Complaint Chief Complaint  Patient presents with   Back Pain    HPI EMMAMARIE KLUENDER is a 73 y.o. female.    Back Pain  Here for low back pain for about 10 days.  Before this began, she had cleaned out underneath her sinks. She was sitting on the floor and leaning over and reaching.   It is bilateral lower back. Having some spasms.  Tylenol  has helped some.  No fall or trauma.  Allergic to metformin  and januvia .  No B/B incontinence. No numbness or tingling and no radiation.  No dysuria No hematuria  Past Medical History:  Diagnosis Date   CAD (coronary artery disease)    Colitis    Colitis, indeterminate 01/31/2012   COPD (chronic obstructive pulmonary disease) (HCC)    Depression 05/16/2017   Depression with anxiety 05/30/2016   Diverticulitis 02/10/2012   DM (diabetes mellitus) (HCC)    Glaucoma    Glaucoma    History of ventricular fibrillation    HTN (hypertension)    Hyperlipidemia    Myocardial infarction (HCC)    12/1998, 01/2011   Osteoporosis    PVD (peripheral vascular disease) [facility])     Patient Active Problem List   Diagnosis Date Noted   Death of family member 11-11-13   Type II or unspecified type diabetes mellitus with peripheral circulatory disorders, uncontrolled(250.72) 07/25/2013   Diabetes mellitus with peripheral vascular disease (HCC) 09/25/2011   Encounter for preventive health examination 03/29/2011   COPD (chronic obstructive pulmonary disease) (HCC)    History of ventricular fibrillation    Unspecified glaucoma 07/12/2010   HYPERCALCEMIA 01/30/2009   SYSTOLIC MURMUR 07/28/2008   DEFICIENCY, VITAMIN D  NOS 03/31/2007   OSTEOPOROSIS 03/24/2007   PERIPHERAL VASCULAR DISEASE 01/03/2007   DIABETES MELLITUS, TYPE II 12/03/2006   Hyperlipidemia associated with type 2 diabetes mellitus (HCC) 12/03/2006   TOBACCO USER 12/03/2006    Essential hypertension 12/03/2006   Coronary atherosclerosis 12/03/2006    Past Surgical History:  Procedure Laterality Date   ABDOMINAL HYSTERECTOMY     endometriosis   CABG x 3  01/22/2011   Dr Lucas   CARDIAC CATHETERIZATION  01/17/2011   Dr Patrisha   LIPOMA EXCISION Left 02/08/2015   Procedure: EXCISION OF LEFT THIGH LIPOMA;  Surgeon: Camellia Blush, MD;  Location: [facility];  Service: General;  Laterality: Left;   S/P aortobifemoral bypass  2008   Dr Oris   S/P myocardial infarction and PCI of the lt circumflex  2000    OB History   No obstetric history on file.      Home Medications    Prior to Admission medications   Medication Sig Start Date End Date Taking? Authorizing Provider  Baclofen 5 MG TABS Take 1 tablet (5 mg total) by mouth every 12 (twelve) hours as needed (muscle pain/spasm). 01/01/24  Yes Che Below K, MD  predniSONE (DELTASONE) 20 MG tablet Take 2 tablets (40 mg total) by mouth daily with breakfast for 5 days. 01/01/24 01/06/24 Yes Vonna Sharlet POUR, MD  Accu-Chek FastClix Lancets MISC Use as instructed to check blood sugar 4 times daily. 06/23/19   Von [place], MD  ACCU-CHEK GUIDE test strip USE ACCU CHECK GUIDE TEST STRIPS AS INSTRUCTED TO CHECK BLOOD SUGAR 4 TIMES DAILY. 02/26/22   [place]on [place], MD  albuterol  (VENTOLIN  HFA) 108 [phone] Base) MCG/ACT  inhaler INHALE 2 PUFFS INTO THE LUNGS EVERY 6 HOURS AS NEEDED FOR SHORTNESS OF BREATH OR WHEEZING 09/02/22   Panosh, Apolinar POUR, MD  amLODipine  (NORVASC ) 10 MG tablet Take 1 tablet (10 mg total) by mouth daily. 12/07/23 03/06/24  Kate Lonni CROME, MD  atorvastatin  (LIPITOR ) 80 MG tablet TAKE 1 TABLET BY MOUTH EVERY DAY 04/16/23   Webb, Padonda B, FNP  Blood Glucose Monitoring Suppl (ACCU-CHEK GUIDE) w/Device KIT 1 each by Does not apply route 4 (four) times daily. Use Accu Chek to check blood sugar 4 times daily. 10/02/22   Von [place], MD  empagliflozin  (JARDIANCE ) 25 MG TABS tablet Take 1 tablet (25 mg  total) by mouth daily before breakfast. 09/04/23   Thapa, [place], MD  ezetimibe  (ZETIA ) 10 MG tablet TAKE 1 TABLET BY MOUTH EVERY DAY 04/16/23   Webb, Padonda B, FNP  fenofibrate  (TRICOR ) 145 MG tablet TAKE 1 TABLET BY MOUTH DAILY**STOP THE ZETIA  11/05/23   Thapa, [place], MD  glucose blood (ACCU-CHEK GUIDE TEST) test strip Use to test blood glucose before meals and at bedtime 07/22/23   Thapa, [place], MD  insulin  aspart (NOVOLOG  FLEXPEN) 100 UNIT/ML FlexPen INJECT 18 UNITS UNDER THE SKIN AT LARGEST MEAL AND 12 UNITS AT JOAQUIN 11/18/23   Thapa, [place], MD  insulin  degludec (TRESIBA  FLEXTOUCH) 200 UNIT/ML FlexTouch Pen INJECT 42 UNITS INTO THE SKIN DAILY. 11/27/23   Thapa, [place], MD  Insulin  Pen Needle (BD PEN NEEDLE NANO U/F) 32G X 4 MM MISC Use as instructed to inject 3 insulin  3 times daily. 04/27/20   Von [place], MD  irbesartan  (AVAPRO ) 300 MG tablet Take 1 tablet (300 mg total) by mouth daily. 07/28/23   Thapa, [place], MD  levothyroxine  (SYNTHROID ) 50 MCG tablet Take 1 tablet (50 mcg total) by mouth daily. 08/17/23   Thapa, [place], MD    Family History Family History  Problem Relation Age of Onset   Diabetes Mother    Liver cancer Mother    Heart attack Father    Diabetes Father    Heart disease Father    Breast cancer Sister    Diabetes Sister    Thyroid  disease Sister    Diabetes Brother    Colon polyps Maternal Uncle    Hypertension Other    Thyroid  disease Other        sisters- sister had parathyroidectomy   Alcohol abuse Other        brother    Social History Social History   Tobacco Use   Smoking status: Every Day    Current packs/day: 1.00    Types: Cigarettes   Smokeless tobacco: Never  Vaping Use   Vaping status: Never Used  Substance Use Topics   Alcohol use: No   Drug use: No     Allergies   Metformin  and related and Januvia  [sitagliptin  phosphate]   Review of Systems Review of Systems  Musculoskeletal:  Positive for back pain.     Physical Exam Triage Vital  Signs ED Triage Vitals  Encounter Vitals Group     BP 01/01/24 1544 (!) 165/84     Girls Systolic BP Percentile --      Girls Diastolic BP Percentile --      Boys Systolic BP Percentile --      Boys Diastolic BP Percentile --      Pulse Rate 01/01/24 1544 74     Resp 01/01/24 1544 16     Temp 01/01/24 1544 98.2 F (36.8 C)  Temp Source 01/01/24 1544 Oral     SpO2 01/01/24 1544 94 %     Weight --      Height --      Head Circumference --      Peak Flow --      Pain Score 01/01/24 1543 10     Pain Loc --      Pain Education --      Exclude from Growth Chart --    No data found.  Updated Vital Signs BP (!) 165/84 (BP Location: Left Arm) Comment: patient states she has not had her BP med today.  Pulse 74   Temp 98.2 F (36.8 C) (Oral)   Resp 16   SpO2 94%   Visual Acuity Right Eye Distance:   Left Eye Distance:   Bilateral Distance:    Right Eye Near:   Left Eye Near:    Bilateral Near:     Physical Exam Vitals reviewed.  Constitutional:      General: She is not in acute distress.    Appearance: She is not ill-appearing, toxic-appearing or diaphoretic.  HENT:     Mouth/Throat:     Mouth: Mucous membranes are moist.  Eyes:     Extraocular Movements: Extraocular movements intact.     Conjunctiva/sclera: Conjunctivae normal.     Pupils: Pupils are equal, round, and reactive to light.  Cardiovascular:     Rate and Rhythm: Normal rate and regular rhythm.     Heart sounds: No murmur heard. Pulmonary:     Effort: Pulmonary effort is normal.     Breath sounds: Normal breath sounds.  Musculoskeletal:     Cervical back: Neck supple.  Lymphadenopathy:     Cervical: No cervical adenopathy.  Skin:    Coloration: Skin is not jaundiced or pale.  Neurological:     General: No focal deficit present.     Mental Status: She is alert and oriented to person, place, and time.  Psychiatric:        Behavior: Behavior normal.      UC Treatments / Results  Labs (all  labs ordered are listed, but only abnormal results are displayed) Labs Reviewed - No data to display  EKG   Radiology No results found.  Procedures Procedures (including critical care time)  Medications Ordered in UC Medications  ketorolac (TORADOL) 30 MG/ML injection 15 mg (has no administration in time range)    Initial Impression / Assessment and Plan / UC Course  I have reviewed the triage vital signs and the nursing notes.  Pertinent labs & imaging results that were available during my care of the patient were reviewed by me and considered in my medical decision making (see chart for details).     Toradol 15 mg given here in the clinic for pain.  5-day burst of prednisone is sent in.  And baclofen is sent in for muscle spasm  I have asked her to follow-up with her primary care   Final Clinical Impressions(s) / UC Diagnoses   Final diagnoses:  Acute bilateral low back pain without sciatica     Discharge Instructions      You have been given a shot of Toradol 30 mg today.  Take prednisone 20 mg--2 daily for 5 days  You can also take Tylenol  as needed with these medications  Take baclofen 5 mg--1 tablet every 12 hours as needed for muscle spasm or muscle pain.  This medication could cause drowsiness or dizziness  Please  follow-up with your primary care     ED Prescriptions     Medication Sig Dispense Auth. Provider   predniSONE (DELTASONE) 20 MG tablet Take 2 tablets (40 mg total) by mouth daily with breakfast for 5 days. 10 tablet Shannette Tabares K, MD   Baclofen 5 MG TABS Take 1 tablet (5 mg total) by mouth every 12 (twelve) hours as needed (muscle pain/spasm). 14 tablet Jowanna Loeffler K, MD      PDMP not reviewed this encounter.   Vonna Sharlet POUR, MD 01/01/24 [phone]

## 2024-01-01 NOTE — ED Triage Notes (Signed)
 Patient states she ws cleaning out under sinks at home and is now having bilateral lower back pain x 1 1/2 weeks. Patient denies radiating of pain to her buttocks or legs.  Patient states she has been taking Tylenol  650 mg every 3 hours for pain.

## 2024-01-01 NOTE — Discharge Instructions (Addendum)
 You have been given a shot of Toradol 30 mg today.  Take prednisone 20 mg--2 daily for 5 days  You can also take Tylenol  as needed with these medications  Take baclofen 5 mg--1 tablet every 12 hours as needed for muscle spasm or muscle pain.  This medication could cause drowsiness or dizziness  Please follow-up with your primary care

## 2024-01-13 ENCOUNTER — Ambulatory Visit: Admitting: Endocrinology

## 2024-01-13 ENCOUNTER — Ambulatory Visit: Payer: Self-pay | Admitting: Endocrinology

## 2024-01-13 ENCOUNTER — Encounter: Payer: Self-pay | Admitting: Endocrinology

## 2024-01-13 VITALS — BP 132/70 | HR 74 | Resp 20 | Ht 64.0 in | Wt 164.6 lb

## 2024-01-13 DIAGNOSIS — Z794 Long term (current) use of insulin: Secondary | ICD-10-CM | POA: Diagnosis not present

## 2024-01-13 DIAGNOSIS — M85851 Other specified disorders of bone density and structure, right thigh: Secondary | ICD-10-CM

## 2024-01-13 DIAGNOSIS — E1165 Type 2 diabetes mellitus with hyperglycemia: Secondary | ICD-10-CM | POA: Diagnosis not present

## 2024-01-13 DIAGNOSIS — E063 Autoimmune thyroiditis: Secondary | ICD-10-CM | POA: Diagnosis not present

## 2024-01-13 LAB — POCT GLYCOSYLATED HEMOGLOBIN (HGB A1C): Hemoglobin A1C: 8.9 % — AB (ref 4.0–5.6)

## 2024-01-13 MED ORDER — NOVOLOG FLEXPEN 100 UNIT/ML ~~LOC~~ SOPN: PEN_INJECTOR | SUBCUTANEOUS | 2 refills | Status: AC

## 2024-01-13 MED ORDER — TRESIBA FLEXTOUCH 200 UNIT/ML ~~LOC~~ SOPN: 42.0000 [IU] | PEN_INJECTOR | Freq: Every day | SUBCUTANEOUS | 2 refills | Status: DC

## 2024-01-13 NOTE — Progress Notes (Signed)
 Outpatient Endocrinology Note Kimberly Miguelangel Korn, MD  01/13/24  Patient's Name: Kimberly [name]    DOB: 11/25/1950    MRN: [id number]                                                    REASON OF VISIT: Follow up of type 2 diabetes mellitus  PCP: Charlett Wanda K, MD  HISTORY OF PRESENT ILLNESS:   Kimberly [name] is a 73 y.o. old female with past medical history listed below, is here for follow up for type 2 diabetes mellitus.    Pertinent Diabetes History: Patient was previously seen by Dr. Von and was last time seen in August 2024.  Patient was diagnosed with type 2 diabetes mellitus in 1995.  Insulin  therapy was started in 2015.  Chronic Diabetes Complications : Retinopathy: no. Last ophthalmology exam was done on annually, following with ophthalmology regularly.  Nephropathy: Microalbuminuria, on ACE/ARB / irbesartan  /Invokana  Peripheral neuropathy: no Coronary artery disease: yes Stroke: no  Relevant comorbidities and cardiovascular risk factors: Obesity: no Body mass index is 28.25 kg/m.  Hypertension: Yes  Hyperlipidemia : Yes, on statin.  On fenofibrate  and Zetia .  Current / Home Diabetic regimen includes: Tresiba  U200 42 units at bedtime. NovoLog  18 units before lunch and 12 units with supper. Jardiance  25 mg daily.  Prior diabetic medications: Metformin  stopped due to nausea and vomiting.  She had taken glimepiride .  Januvia  caused swelling. Invokana  expensive.  Switched to Jardiance .  Glycemic data:   Accu-Chek guide glucometer.  Blood sugar download from July 16 to July 30 , 2025 average 216.  Checking blood sugar mostly 2 times a day.  Fasting blood sugar 177, 172, 143, 102, 153, 154.  Blood sugar in the evening 257, 184, 186, 126, 157, 188, 131.  Last week blood sugar 103, 306, 105, 312, 286.  Hyperglycemia in the evening.  Patient has no hypoglycemic episodes. Patient has hypoglycemia awareness.,  Factors modifying glucose control: 1.  Diabetic diet assessment: 2  meals a day, occasionally snack in the afternoon.  2.  Staying active or exercising: Walking.  3.  Medication compliance: compliant all of the time.   # HYPOTHYROIDISM:  She has a family history of hypothyroidism, Graves' disease and thyroid  nodules. On her visit in 11/20 she was complaining of feeling more tired as well as colder; had some weight gain also. Baseline TSH was 6. With levothyroxine  25 mcg daily she did not feel any better and with TSH still 4.5 she was told to go up to 50 mcg in 07/2019.  OSTEOPENIA:  No history of fractures  Her last bone density was in 4/22  Results:   Lumbar spine L1-L4(L3) Femoral neck (FN)  T-score 0.2 RFN: -1.0 LFN: -1.3  Change in BMD from previous DXA test (%) Down 0.6% Down 2.0%    She was tried on Evista  but she says it was causing her hot flashes and night sweats and she stopped taking this.  She is taking 1000 units of vitamin D  with adequate vitamin D  levels.   Previously in 07/2015 T score -2.3 was at the wrist, this was not measured in 2019.   # HYPERCALCEMIA: She has had periodic mild hypercalcemia since about 2010; PTH normal. Calcium  is generally upper normal and stable  Interval history  Patient reports her niece had prolonged  hospitalization, patient was kind of depressed staying with niece at hospital, limited physical activities and increased eating.  Patient relates she also had back pain to prednisone  for few days.  This condition was on her diabetes control.  Hemoglobin A1c today 8.9%.  Patient had significantly high blood sugar last week when she was taking prednisone .  She is no longer taking prednisone .  Blood sugar has improved after stopping prednisone .  Glucometer data as reviewed above.  She has been taking levothyroxine , no hypo and hyperthyroid symptoms.  No other complaints today.  REVIEW OF SYSTEMS As per history of present illness.   PAST MEDICAL HISTORY: Past Medical History:  Diagnosis Date   CAD (coronary  artery disease)    Colitis    Colitis, indeterminate 01/31/2012   COPD (chronic obstructive pulmonary disease) (HCC)    Depression 05/16/2017   Depression with anxiety 05/30/2016   Diverticulitis 02/10/2012   DM (diabetes mellitus) (HCC)    Glaucoma    Glaucoma    History of ventricular fibrillation    HTN (hypertension)    Hyperlipidemia    Myocardial infarction (HCC)    12/1998, 01/2011   Osteoporosis    PVD (peripheral vascular disease) (HCC)     PAST SURGICAL HISTORY: Past Surgical History:  Procedure Laterality Date   ABDOMINAL HYSTERECTOMY     endometriosis   CABG x 3  01/22/2011   Dr Lucas   CARDIAC CATHETERIZATION  01/17/2011   Dr Patrisha   LIPOMA EXCISION Left 02/08/2015   Procedure: EXCISION OF LEFT THIGH LIPOMA;  Surgeon: Camellia Blush, MD;  Location: [facility];  Service: General;  Laterality: Left;   S/P aortobifemoral bypass  2008   Dr Oris   S/P myocardial infarction and PCI of the lt circumflex  2000    ALLERGIES: Allergies  Allergen Reactions   Metformin  And Related Nausea And Vomiting    After one pill at hospital   Januvia  [Sitagliptin  Phosphate] Other (See Comments)    Swelling and edema see 4 13 OV    FAMILY HISTORY:  Family History  Problem Relation Age of Onset   Diabetes Mother    Liver cancer Mother    Heart attack Father    Diabetes Father    Heart disease Father    Breast cancer Sister    Diabetes Sister    Thyroid  disease Sister    Diabetes Brother    Colon polyps Maternal Uncle    Hypertension Other    Thyroid  disease Other        sisters- sister had parathyroidectomy   Alcohol abuse Other        brother    SOCIAL HISTORY: Social History   Socioeconomic History   Marital status: Married    Spouse name: Not on file   Number of children: 3   Years of education: Not on file   Highest education level: Not on file  Occupational History   Occupation: housewife    Employer: UNEMPLOYED  Tobacco Use   Smoking  status: Every Day    Current packs/day: 1.00    Types: Cigarettes   Smokeless tobacco: Never  Vaping Use   Vaping status: Never Used  Substance and Sexual Activity   Alcohol use: No   Drug use: No   Sexual activity: Not on file  Other Topics Concern   Not on file  Social History Narrative   t widowed    Regular exercise- yes not recently       Quit  tobacco in August at heart surgery. Restarted    Husband diagnosed with liver cancer since 2014 passed April 2015    She was care taker.   No pets   Social Drivers of Corporate investment banker Strain: Low Risk  (03/20/2020)   Overall Financial Resource Strain (CARDIA)    Difficulty of Paying Living Expenses: Not hard at all  Recent Concern: Financial Resource Strain - Medium Risk (01/30/2020)   Overall Financial Resource Strain (CARDIA)    Difficulty of Paying Living Expenses: Somewhat hard  Food Insecurity: No Food Insecurity (03/10/2023)   Hunger Vital Sign    Worried About Running Out of Food in the Last Year: Never true    Ran Out of Food in the Last Year: Never true  Transportation Needs: No Transportation Needs (03/20/2020)   PRAPARE - Administrator, Civil Service (Medical): No    Lack of Transportation (Non-Medical): No  Physical Activity: Insufficiently Active (03/10/2023)   Exercise Vital Sign    Days of Exercise per Week: 3 days    Minutes of Exercise per Session: 10 min  Stress: No Stress Concern Present (03/10/2023)   [employer] of Occupational Health - Occupational Stress Questionnaire    Feeling of Stress : Not at all  Social Connections: Socially Isolated (03/10/2023)   Social Connection and Isolation Panel    Frequency of Communication with Friends and Family: More than three times a week    Frequency of Social Gatherings with Friends and Family: Twice a week    Attends Religious Services: Never    Database administrator or Organizations: No    Attends Banker Meetings: Never     Marital Status: Widowed    MEDICATIONS:  Current Outpatient Medications  Medication Sig Dispense Refill   Accu-Chek FastClix Lancets MISC Use as instructed to check blood sugar 4 times daily. 150 each 2   ACCU-CHEK GUIDE test strip USE ACCU CHECK GUIDE TEST STRIPS AS INSTRUCTED TO CHECK BLOOD SUGAR 4 TIMES DAILY. 100 strip 4   albuterol  (VENTOLIN  HFA) 108 (90 Base) MCG/ACT inhaler INHALE 2 PUFFS INTO THE LUNGS EVERY 6 HOURS AS NEEDED FOR SHORTNESS OF BREATH OR WHEEZING 6.7 each 2   amLODipine  (NORVASC ) 10 MG tablet Take 1 tablet (10 mg total) by mouth daily. 90 tablet 3   atorvastatin  (LIPITOR ) 80 MG tablet TAKE 1 TABLET BY MOUTH EVERY DAY 90 tablet 2   Baclofen  5 MG TABS Take 1 tablet (5 mg total) by mouth every 12 (twelve) hours as needed (muscle pain/spasm). 14 tablet 0   Blood Glucose Monitoring Suppl (ACCU-CHEK GUIDE) w/Device KIT 1 each by Does not apply route 4 (four) times daily. Use Accu Chek to check blood sugar 4 times daily. 1 kit 0   empagliflozin  (JARDIANCE ) 25 MG TABS tablet Take 1 tablet (25 mg total) by mouth daily before breakfast. 90 tablet 3   ezetimibe  (ZETIA ) 10 MG tablet TAKE 1 TABLET BY MOUTH EVERY DAY 90 tablet 2   fenofibrate  (TRICOR ) 145 MG tablet TAKE 1 TABLET BY MOUTH DAILY**STOP THE ZETIA  90 tablet 3   glucose blood (ACCU-CHEK GUIDE TEST) test strip Use to test blood glucose before meals and at bedtime 300 each 12   Insulin  Pen Needle (BD PEN NEEDLE NANO U/F) 32G X 4 MM MISC Use as instructed to inject 3 insulin  3 times daily. 100 each 3   irbesartan  (AVAPRO ) 300 MG tablet Take 1 tablet (300 mg total) by mouth daily. 90  tablet 1   levothyroxine  (SYNTHROID ) 50 MCG tablet Take 1 tablet (50 mcg total) by mouth daily. 90 tablet 3   insulin  aspart (NOVOLOG  FLEXPEN) 100 UNIT/ML FlexPen INJECT 18 UNITS UNDER THE SKIN AT LARGEST MEAL AND 12 UNITS AT DINNER 30 mL 2   insulin  degludec (TRESIBA  FLEXTOUCH) 200 UNIT/ML FlexTouch Pen Inject 42 Units into the skin daily. 9 mL 2    No current facility-administered medications for this visit.    PHYSICAL EXAM: Vitals:   01/13/24 0828  BP: 132/70  Pulse: 74  Resp: 20  SpO2: 95%  Weight: 164 lb 9.6 oz (74.7 kg)  Height: 5' 4 (1.626 m)    Body mass index is 28.25 kg/m.  Wt Readings from Last 3 Encounters:  01/13/24 164 lb 9.6 oz (74.7 kg)  12/07/23 162 lb 12.8 oz (73.8 kg)  09/30/23 158 lb 12.8 oz (72 kg)    General: Well developed, well nourished female in no apparent distress.  HEENT: AT/[place], no external lesions.  Eyes: Conjunctiva clear and no icterus. Neck: Neck supple  Lungs: Respirations not labored Neurologic: Alert, oriented, normal speech Extremities / Skin: Dry. Psychiatric: Does not appear depressed or anxious  Diabetic Foot Exam - Simple   No data filed    LABS Reviewed Lab Results  Component Value Date   HGBA1C 8.9 (A) 01/13/2024   HGBA1C 7.3 (H) 08/31/2023   HGBA1C 7.9 (H) 04/30/2023   Lab Results  Component Value Date   FRUCTOSAMINE 262 02/03/2019   Lab Results  Component Value Date   CHOL 180 12/07/2023   HDL 26 (L) 12/07/2023   LDLCALC 101 (H) 12/07/2023   LDLDIRECT 77.0 08/13/2021   TRIG 311 (H) 12/07/2023   CHOLHDL 6.9 (H) 12/07/2023   Lab Results  Component Value Date   MICRALBCREAT 0.4 03/05/2010   MICRALBCREAT 4.2 01/18/2009   Lab Results  Component Value Date   CREATININE 0.90 12/07/2023   Lab Results  Component Value Date   GFR 62.71 04/30/2023    ASSESSMENT / PLAN  1. Uncontrolled type 2 diabetes mellitus with hyperglycemia, with long-term current use of insulin  (HCC)   2. Acquired autoimmune hypothyroidism   3. Osteopenia of neck of right femur     Diabetes Mellitus type 2, complicated by microalbuminuria. - Diabetic status / severity: Uncontrolled.  Worsening.  Lab Results  Component Value Date   HGBA1C 8.9 (A) 01/13/2024    - Hemoglobin A1c goal : <7%  Patient had increased eating and limited physical activity and also took  prednisone  worsening the diabetes control.  In the last few days blood sugar has been improving.  No change in diabetes regimen.  Encourage patient to limit portion control and increase physical activities as tolerated.  - Medications: No change. Tresiba  U200 42 units at bedtime. NovoLog  18 units before lunch and 12 units with supper. Can do Jardiance  25 mg daily.    Discussed about decreasing the dose of NovoLog  for lunch time if she is having increased physical activity or eating less food for the lungs.  - Home glucose testing: Before meals and at bedtime. - Discussed/ Gave Hypoglycemia treatment plan.  # Consult : not required at this time.   # Annual urine for microalbuminuria/ creatinine ratio, no microalbuminuria currently, continue ACE/ARB /irbesartan , Invokana .  Will check urine microalbumin creatinine ratio today.  Last  Lab Results  Component Value Date   MICRALBCREAT 0.4 03/05/2010    # Foot check nightly.  # Annual dilated diabetic eye exams.   -  Diet: Make healthy diabetic food choices - Life style / activity / exercise: Discussed.  2. Blood pressure  -  BP Readings from Last 1 Encounters:  01/13/24 132/70    - Control is in target.  - No change in current plans.   3. Lipid status / Hyperlipidemia - Last  Lab Results  Component Value Date   LDLCALC 101 (H) 12/07/2023   - Continue atorvastatin  80 mg daily.  Continue fenofibrate  /Zetia .  Managed by primary care provider.  # Primary hypothyroidism -TSH normal in September.  Will continue current dose of levothyroxine  50 mcg daily.  Annual thyroid  lab.  # Osteopenia -Last DEXA scan in April 2022. -Patient vitamin D  is in normal -Consider repeat DEXA scan in 1 to 2 years.  Diagnoses and all orders for this visit:  Uncontrolled type 2 diabetes mellitus with hyperglycemia, with long-term current use of insulin  (HCC) -     POCT glycosylated hemoglobin (Hb A1C) -     insulin  degludec (TRESIBA   FLEXTOUCH) 200 UNIT/ML FlexTouch Pen; Inject 42 Units into the skin daily. -     insulin  aspart (NOVOLOG  FLEXPEN) 100 UNIT/ML FlexPen; INJECT 18 UNITS UNDER THE SKIN AT LARGEST MEAL AND 12 UNITS AT DINNER -     Microalbumin / creatinine urine ratio  Acquired autoimmune hypothyroidism  Osteopenia of neck of right femur    DISPOSITION Follow up in clinic in 4 months suggested.  Patient prefers to come in 4 months rather than in 3 months.   All questions answered and patient verbalized understanding of the plan.  Kimberly Luva Metzger, MD [facility] Endocrinology [facility] [address] [place], [address] [place], [place] [address] Phone # [phone]  At least part of this note was generated using voice recognition software. Inadvertent word errors may have occurred, which were not recognized during the proofreading process.

## 2024-01-14 ENCOUNTER — Other Ambulatory Visit: Payer: Self-pay | Admitting: Endocrinology

## 2024-01-14 DIAGNOSIS — I10 Essential (primary) hypertension: Secondary | ICD-10-CM

## 2024-01-14 LAB — MICROALBUMIN / CREATININE URINE RATIO
Creatinine, Urine: 35 mg/dL (ref 20–275)
Microalb, Ur: <0.2 mg/dL

## 2024-01-14 NOTE — Telephone Encounter (Signed)
 Refill request complete

## 2024-01-20 ENCOUNTER — Ambulatory Visit: Payer: Self-pay | Admitting: Endocrinology

## 2024-01-20 DIAGNOSIS — H43813 Vitreous degeneration, bilateral: Secondary | ICD-10-CM | POA: Diagnosis not present

## 2024-01-20 DIAGNOSIS — Z961 Presence of intraocular lens: Secondary | ICD-10-CM | POA: Diagnosis not present

## 2024-01-20 DIAGNOSIS — E119 Type 2 diabetes mellitus without complications: Secondary | ICD-10-CM | POA: Diagnosis not present

## 2024-01-20 DIAGNOSIS — H2512 Age-related nuclear cataract, left eye: Secondary | ICD-10-CM | POA: Diagnosis not present

## 2024-01-20 LAB — HM DIABETES EYE EXAM

## 2024-02-04 DIAGNOSIS — H2512 Age-related nuclear cataract, left eye: Secondary | ICD-10-CM | POA: Diagnosis not present

## 2024-02-11 DIAGNOSIS — H2512 Age-related nuclear cataract, left eye: Secondary | ICD-10-CM | POA: Diagnosis not present

## 2024-02-23 ENCOUNTER — Telehealth: Payer: Self-pay | Admitting: Internal Medicine

## 2024-03-04 ENCOUNTER — Telehealth: Payer: Self-pay

## 2024-03-04 ENCOUNTER — Other Ambulatory Visit: Payer: Self-pay

## 2024-03-04 MED ORDER — ATORVASTATIN CALCIUM 80 MG PO TABS: 80.0000 mg | ORAL_TABLET | Freq: Every day | ORAL | 2 refills | Status: AC

## 2024-03-04 NOTE — Telephone Encounter (Signed)
>>   Mar 04, 2024  8:58 AM Larissa S wrote: Patient calling to check status of med refill. States she only has 2 pills remaining. Requesting prescription be sent to pharmacy   Copied from CRM [id number]. Topic: Clinical - Medication Refill >> Feb 23, 2024  9:37 AM Anairis L wrote: Medication: atorvastatin  (LIPITOR ) 80 MG tablet  Has the patient contacted their pharmacy? Yes (Agent: If no, request that the patient contact the pharmacy for the refill. If patient does not wish to contact the pharmacy document the reason why and proceed with request.) (Agent: If yes, when and what did the pharmacy advise?)  This is the patient's preferred pharmacy:  [id number] [place], [place] - [address] [facility] [address] [address] [place] [address] Phone: [phone] Fax: [phone]   Phone: [phone] Fax: [phone]  Is this the correct pharmacy for this prescription? Yes If no, delete pharmacy and type the correct one.   Has the prescription been filled recently? No  Is the patient out of the medication? No  Has the patient been seen for an appointment in the last year OR does the patient have an upcoming appointment? Yes  Can we respond through MyChart? No  Agent: Please be advised that Rx refills may take up to 3 business days. We ask that you follow-up with your pharmacy.

## 2024-03-04 NOTE — Telephone Encounter (Signed)
 Rx sent.

## 2024-03-31 ENCOUNTER — Other Ambulatory Visit (INDEPENDENT_AMBULATORY_CARE_PROVIDER_SITE_OTHER)

## 2024-03-31 DIAGNOSIS — I739 Peripheral vascular disease, unspecified: Secondary | ICD-10-CM | POA: Diagnosis not present

## 2024-03-31 DIAGNOSIS — E1151 Type 2 diabetes mellitus with diabetic peripheral angiopathy without gangrene: Secondary | ICD-10-CM | POA: Diagnosis not present

## 2024-03-31 DIAGNOSIS — E785 Hyperlipidemia, unspecified: Secondary | ICD-10-CM | POA: Diagnosis not present

## 2024-03-31 DIAGNOSIS — E1169 Type 2 diabetes mellitus with other specified complication: Secondary | ICD-10-CM

## 2024-03-31 DIAGNOSIS — I10 Essential (primary) hypertension: Secondary | ICD-10-CM | POA: Diagnosis not present

## 2024-03-31 DIAGNOSIS — Z79899 Other long term (current) drug therapy: Secondary | ICD-10-CM

## 2024-03-31 DIAGNOSIS — D751 Secondary polycythemia: Secondary | ICD-10-CM

## 2024-03-31 LAB — HEPATIC FUNCTION PANEL
ALT: 19 U/L (ref 0–35)
AST: 23 U/L (ref 0–37)
Albumin: 4.4 g/dL (ref 3.5–5.2)
Alkaline Phosphatase: 60 U/L (ref 39–117)
Bilirubin, Direct: 0.2 mg/dL (ref 0.0–0.3)
Total Bilirubin: 0.5 mg/dL (ref 0.2–1.2)
Total Protein: 7.3 g/dL (ref 6.0–8.3)

## 2024-03-31 LAB — CBC WITH DIFFERENTIAL/PLATELET
Basophils Absolute: 0.1 K/uL (ref 0.0–0.1)
Basophils Relative: 0.8 % (ref 0.0–3.0)
Eosinophils Absolute: 0.2 K/uL (ref 0.0–0.7)
Eosinophils Relative: 2.4 % (ref 0.0–5.0)
HCT: 45.8 % (ref 36.0–46.0)
Hemoglobin: 15.2 g/dL — ABNORMAL HIGH (ref 12.0–15.0)
Lymphocytes Relative: 19.9 % (ref 12.0–46.0)
Lymphs Abs: 1.4 K/uL (ref 0.7–4.0)
MCHC: 33.1 g/dL (ref 30.0–36.0)
MCV: 85.7 fl (ref 78.0–100.0)
Monocytes Absolute: 0.5 K/uL (ref 0.1–1.0)
Monocytes Relative: 6.6 % (ref 3.0–12.0)
Neutro Abs: 4.8 K/uL (ref 1.4–7.7)
Neutrophils Relative %: 70.3 % (ref 43.0–77.0)
Platelets: 155 K/uL (ref 150.0–400.0)
RBC: 5.35 Mil/uL — ABNORMAL HIGH (ref 3.87–5.11)
RDW: 15.3 % (ref 11.5–15.5)
WBC: 6.8 K/uL (ref 4.0–10.5)

## 2024-03-31 LAB — MICROALBUMIN / CREATININE URINE RATIO
Creatinine,U: 23.8 mg/dL
Microalb Creat Ratio: 46.7 mg/g — ABNORMAL HIGH (ref 0.0–30.0)
Microalb, Ur: 1.1 mg/dL (ref 0.0–1.9)

## 2024-03-31 LAB — BASIC METABOLIC PANEL WITH GFR
BUN: 20 mg/dL (ref 6–23)
CO2: 25 meq/L (ref 19–32)
Calcium: 10.1 mg/dL (ref 8.4–10.5)
Chloride: 102 meq/L (ref 96–112)
Creatinine, Ser: 0.86 mg/dL (ref 0.40–1.20)
GFR: 67.56 mL/min (ref >60.00)
Glucose, Bld: 157 mg/dL — ABNORMAL HIGH (ref 70–99)
Potassium: 4.7 meq/L (ref 3.5–5.1)
Sodium: 138 meq/L (ref 135–145)

## 2024-03-31 LAB — LIPID PANEL
Cholesterol: 173 mg/dL (ref 0–200)
HDL: 24.1 mg/dL — ABNORMAL LOW (ref >39.00)
LDL Cholesterol: 79 mg/dL (ref 0–99)
NonHDL: 148.79
Total CHOL/HDL Ratio: 7
Triglycerides: 347 mg/dL — ABNORMAL HIGH (ref 0.0–149.0)
VLDL: 69.4 mg/dL — ABNORMAL HIGH (ref 0.0–40.0)

## 2024-03-31 LAB — TSH: TSH: 3.75 u[IU]/mL (ref 0.35–5.50)

## 2024-04-07 ENCOUNTER — Ambulatory Visit: Admitting: Internal Medicine

## 2024-04-07 ENCOUNTER — Encounter: Payer: Self-pay | Admitting: Internal Medicine

## 2024-04-07 VITALS — BP 148/78 | HR 85 | Temp 97.9°F | Ht 64.0 in | Wt 167.6 lb

## 2024-04-07 DIAGNOSIS — Z6379 Other stressful life events affecting family and household: Secondary | ICD-10-CM | POA: Diagnosis not present

## 2024-04-07 DIAGNOSIS — F17200 Nicotine dependence, unspecified, uncomplicated: Secondary | ICD-10-CM | POA: Diagnosis not present

## 2024-04-07 DIAGNOSIS — Z23 Encounter for immunization: Secondary | ICD-10-CM | POA: Diagnosis not present

## 2024-04-07 DIAGNOSIS — E785 Hyperlipidemia, unspecified: Secondary | ICD-10-CM

## 2024-04-07 DIAGNOSIS — Z79899 Other long term (current) drug therapy: Secondary | ICD-10-CM | POA: Diagnosis not present

## 2024-04-07 DIAGNOSIS — E1169 Type 2 diabetes mellitus with other specified complication: Secondary | ICD-10-CM

## 2024-04-07 DIAGNOSIS — I10 Essential (primary) hypertension: Secondary | ICD-10-CM

## 2024-04-07 NOTE — Patient Instructions (Signed)
 Good to see you today  Monitor BP. As planned  Will send info to your  Cardiology endo team .   As we discussed .   {Plan 6 mos  fu or as needed

## 2024-04-07 NOTE — Progress Notes (Signed)
 Chief Complaint  Patient presents with   Medical Management of Chronic Issues    HPI: Kimberly [name] 73 y.o. come in for Chronic disease management  6 mos check  Under CV and endocrine care  DM: last A1c up felt from meds from back predicament denies lows and says  bgs are coming down  HLD PVD CAD   zetia  atorvastatin  : no se  reported   HT  on amlodipine  10 and avapro   reports bp had been in 120 130 range /60  before got news of D in law with aggressive  breast cancer with min chance of cure. ( 5 days ago)  Continue to smoke 1ppd   denies resp sx   ROS: See pertinent positives and negatives per HPI.  Past Medical History:  Diagnosis Date   CAD (coronary artery disease)    Colitis    Colitis, indeterminate 01/31/2012   COPD (chronic obstructive pulmonary disease) (HCC)    Depression 05/16/2017   Depression with anxiety 05/30/2016   Diverticulitis 02/10/2012   DM (diabetes mellitus) (HCC)    Glaucoma    Glaucoma    History of ventricular fibrillation    HTN (hypertension)    Hyperlipidemia    Myocardial infarction (HCC)    12/1998, 01/2011   Osteoporosis    PVD (peripheral vascular disease)     Family History  Problem Relation Age of Onset   Diabetes Mother    Liver cancer Mother    Heart attack Father    Diabetes Father    Heart disease Father    Breast cancer Sister    Diabetes Sister    Thyroid  disease Sister    Diabetes Brother    Colon polyps Maternal Uncle    Hypertension Other    Thyroid  disease Other        sisters- sister had parathyroidectomy   Alcohol abuse Other        brother    Social History   Socioeconomic History   Marital status: Married    Spouse name: Not on file   Number of children: 3   Years of education: Not on file   Highest education level: Not on file  Occupational History   Occupation: housewife    Employer: UNEMPLOYED  Tobacco Use   Smoking status: Every Day    Current packs/day: 1.00    Types: Cigarettes   Smokeless  tobacco: Never  Vaping Use   Vaping status: Never Used  Substance and Sexual Activity   Alcohol use: No   Drug use: No   Sexual activity: Not on file  Other Topics Concern   Not on file  Social History Narrative   t widowed    Regular exercise- yes not recently       Quit tobacco in August at heart surgery. Restarted    Husband diagnosed with liver cancer since 2014 passed April 2015    She was care taker.   No pets   Social Drivers of Corporate Investment Banker Strain: Low Risk  (03/20/2020)   Overall Financial Resource Strain (CARDIA)    Difficulty of Paying Living Expenses: Not hard at all  Recent Concern: Financial Resource Strain - Medium Risk (01/30/2020)   Overall Financial Resource Strain (CARDIA)    Difficulty of Paying Living Expenses: Somewhat hard  Food Insecurity: No Food Insecurity (03/10/2023)   Hunger Vital Sign    Worried About Running Out of Food in the Last Year: Never true  Ran Out of Food in the Last Year: Never true  Transportation Needs: No Transportation Needs (03/20/2020)   PRAPARE - Administrator, Civil Service (Medical): No    Lack of Transportation (Non-Medical): No  Physical Activity: Insufficiently Active (03/10/2023)   Exercise Vital Sign    Days of Exercise per Week: 3 days    Minutes of Exercise per Session: 10 min  Stress: No Stress Concern Present (03/10/2023)   [employer] of Occupational Health - Occupational Stress Questionnaire    Feeling of Stress : Not at all  Social Connections: Socially Isolated (03/10/2023)   Social Connection and Isolation Panel    Frequency of Communication with Friends and Family: More than three times a week    Frequency of Social Gatherings with Friends and Family: Twice a week    Attends Religious Services: Never    Database Administrator or Organizations: No    Attends Banker Meetings: Never    Marital Status: Widowed    Outpatient Medications Prior to Visit  Medication  Sig Dispense Refill   albuterol  (VENTOLIN  HFA) 108 (90 Base) MCG/ACT inhaler INHALE 2 PUFFS INTO THE LUNGS EVERY 6 HOURS AS NEEDED FOR SHORTNESS OF BREATH OR WHEEZING 6.7 each 2   amLODipine  (NORVASC ) 10 MG tablet Take 1 tablet (10 mg total) by mouth daily. 90 tablet 3   atorvastatin  (LIPITOR ) 80 MG tablet Take 1 tablet (80 mg total) by mouth daily. 90 tablet 2   Blood Glucose Monitoring Suppl (ACCU-CHEK GUIDE) w/Device KIT 1 each by Does not apply route 4 (four) times daily. Use Accu Chek to check blood sugar 4 times daily. 1 kit 0   empagliflozin  (JARDIANCE ) 25 MG TABS tablet Take 1 tablet (25 mg total) by mouth daily before breakfast. 90 tablet 3   ezetimibe  (ZETIA ) 10 MG tablet TAKE 1 TABLET BY MOUTH EVERY DAY 90 tablet 2   fenofibrate  (TRICOR ) 145 MG tablet TAKE 1 TABLET BY MOUTH DAILY**STOP THE ZETIA  90 tablet 3   glucose blood (ACCU-CHEK GUIDE TEST) test strip Use to test blood glucose before meals and at bedtime 300 each 12   insulin  aspart (NOVOLOG  FLEXPEN) 100 UNIT/ML FlexPen INJECT 18 UNITS UNDER THE SKIN AT LARGEST MEAL AND 12 UNITS AT DINNER 30 mL 2   insulin  degludec (TRESIBA  FLEXTOUCH) 200 UNIT/ML FlexTouch Pen Inject 42 Units into the skin daily. 9 mL 2   irbesartan  (AVAPRO ) 300 MG tablet TAKE 1 TABLET BY MOUTH EVERY DAY 90 tablet 1   levothyroxine  (SYNTHROID ) 50 MCG tablet Take 1 tablet (50 mcg total) by mouth daily. 90 tablet 3   Accu-Chek FastClix Lancets MISC Use as instructed to check blood sugar 4 times daily. 150 each 2   ACCU-CHEK GUIDE test strip USE ACCU CHECK GUIDE TEST STRIPS AS INSTRUCTED TO CHECK BLOOD SUGAR 4 TIMES DAILY. 100 strip 4   Insulin  Pen Needle (BD PEN NEEDLE NANO U/F) 32G X 4 MM MISC Use as instructed to inject 3 insulin  3 times daily. 100 each 3   Baclofen  5 MG TABS Take 1 tablet (5 mg total) by mouth every 12 (twelve) hours as needed (muscle pain/spasm). (Patient not taking: Reported on 04/07/2024) 14 tablet 0   No facility-administered medications prior  to visit.     EXAM:  BP (!) 148/78   Pulse 85   Temp 97.9 F (36.6 C) (Oral)   Ht 5' 4 (1.626 m)   Wt 167 lb 9.6 oz (76 kg)   SpO2  97%   BMI 28.77 kg/m   Body mass index is 28.77 kg/m.  GENERAL: vitals reviewed and listed above, alert, oriented, appears well hydrated and in no acute distress HEENT: atraumatic, conjunctiva  clear, no obvious abnormalities on inspection of external nose and ears NECK: no obvious masses on inspection palpation  LUNGS: clear to auscultation bilaterally, no wheezes, rales or rhonchi,  CV: HRRR, no clubbing cyanosis or  peripheral edema nl cap refill  faint murmur upper sb no g  MS: moves all extremities without noticeable focal  abnormality PSYCH: pleasant and cooperative, no obvious depression or anxiety cognition  intact  Lab Results  Component Value Date   WBC 6.8 03/31/2024   HGB 15.2 (H) 03/31/2024   HCT 45.8 03/31/2024   PLT 155.0 03/31/2024   GLUCOSE 157 (H) 03/31/2024   CHOL 173 03/31/2024   TRIG 347.0 (H) 03/31/2024   HDL 24.10 (L) 03/31/2024   LDLDIRECT 77.0 08/13/2021   LDLCALC 79 03/31/2024   ALT 19 03/31/2024   AST 23 03/31/2024   NA 138 03/31/2024   K 4.7 03/31/2024   CL 102 03/31/2024   CREATININE 0.86 03/31/2024   BUN 20 03/31/2024   CO2 25 03/31/2024   TSH 3.75 03/31/2024   INR 1.06 02/01/2012   HGBA1C 8.9 (A) 01/13/2024   MICROALBUR 1.1 03/31/2024   BP Readings from Last 3 Encounters:  04/07/24 (!) 148/78  01/13/24 132/70  01/01/24 (!) 165/84    ASSESSMENT AND PLAN:  Discussed the following assessment and plan:  Hyperlipidemia associated with type 2 diabetes mellitus (HCC)  Medication management  Essential hypertension  Influenza vaccine needed - Plan: Flu vaccine HIGH DOSE PF(Fluzone Trivalent)  Tobacco dependence with current use  Stress due to illness of family member LDL is almost at goal  79  is on atova zetia  and tricor   no reported se  will get info to cards as to any change they wish for  futer optimization. She has no sx of active cad disease . Tobacco cessation advised as she well knows   . DM was felt to be less control when was on steroids for back predicament . Assume fu A1c when has appt in December  Bp not at goal but feels  up from distress from  news of  poor prognostic dx of family member .  Counsel  stress adaptive  activities.   -Patient advised to return or notify health care team  if  new concerns arise.  Patient Instructions  Good to see you today  Monitor BP. As planned  Will send info to your  Cardiology endo team .   As we discussed .   {Plan 6 mos  fu or as needed    Kama Cammarano K. Valeriano Bain M.D.

## 2024-04-09 ENCOUNTER — Encounter: Payer: Self-pay | Admitting: Internal Medicine

## 2024-05-16 ENCOUNTER — Ambulatory Visit: Payer: Self-pay | Admitting: Endocrinology

## 2024-05-16 ENCOUNTER — Ambulatory Visit: Admitting: Endocrinology

## 2024-05-16 ENCOUNTER — Encounter: Payer: Self-pay | Admitting: Endocrinology

## 2024-05-16 VITALS — BP 154/72 | HR 81 | Resp 16 | Ht 64.0 in | Wt 166.6 lb

## 2024-05-16 DIAGNOSIS — Z794 Long term (current) use of insulin: Secondary | ICD-10-CM

## 2024-05-16 DIAGNOSIS — E1165 Type 2 diabetes mellitus with hyperglycemia: Secondary | ICD-10-CM

## 2024-05-16 DIAGNOSIS — E063 Autoimmune thyroiditis: Secondary | ICD-10-CM | POA: Diagnosis not present

## 2024-05-16 DIAGNOSIS — E559 Vitamin D deficiency, unspecified: Secondary | ICD-10-CM

## 2024-05-16 DIAGNOSIS — M85851 Other specified disorders of bone density and structure, right thigh: Secondary | ICD-10-CM

## 2024-05-16 LAB — POCT GLYCOSYLATED HEMOGLOBIN (HGB A1C): Hemoglobin A1C: 8.1 % — AB (ref 4.0–5.6)

## 2024-05-16 MED ORDER — TRESIBA FLEXTOUCH 200 UNIT/ML ~~LOC~~ SOPN: 44.0000 [IU] | PEN_INJECTOR | Freq: Every day | SUBCUTANEOUS | 2 refills | Status: AC

## 2024-05-16 MED ORDER — EMPAGLIFLOZIN 25 MG PO TABS: 25.0000 mg | ORAL_TABLET | Freq: Every day | ORAL | 3 refills | Status: AC

## 2024-05-16 NOTE — Progress Notes (Signed)
 Outpatient Endocrinology Note Cliffton Spradley, MD  05/16/24  Patient's Name: Kimberly [name]    DOB: 1951/01/13    MRN: [id number]                                                    REASON OF VISIT: Follow up of type 2 diabetes mellitus  PCP: Charlett Wanda K, MD  HISTORY OF PRESENT ILLNESS:   Kimberly [name] is a 73 y.o. old female with past medical history listed below, is here for follow up for type 2 diabetes mellitus /hypothyroidism.    Pertinent Diabetes History: Patient was previously seen by Dr. Von and was last time seen in August 2024.  Patient was diagnosed with type 2 diabetes mellitus in 1995.  Insulin  therapy was started in 2015.  Chronic Diabetes Complications : Retinopathy: no. Last ophthalmology exam was done on annually, following with ophthalmology regularly.  Nephropathy: Microalbuminuria, on ACE/ARB / irbesartan  /Invokana  Peripheral neuropathy: no Coronary artery disease: yes Stroke: no  Relevant comorbidities and cardiovascular risk factors: Obesity: no Body mass index is 28.6 kg/m.  Hypertension: Yes  Hyperlipidemia : Yes, on statin.  On fenofibrate  and Zetia .  Current / Home Diabetic regimen includes: Tresiba  U200 42 units at bedtime. NovoLog  18 units before lunch and 12 units with supper. Jardiance  25 mg daily.  Prior diabetic medications: Metformin  stopped due to nausea and vomiting.  She had taken glimepiride .  Januvia  caused swelling. Invokana  expensive.  Switched to Jardiance .  Glycemic data:   Accu-Chek guide glucometer.  Not able to download glucometer data in the clinic today.  She reports fasting morning sugar 130-150 range and blood sugar in the afternoon and evening 200s.  Patient has no hypoglycemic episodes. Patient has hypoglycemia awareness.,  Factors modifying glucose control: 1.  Diabetic diet assessment: 2 meals a day, occasionally snack in the afternoon.  2.  Staying active or exercising: Walking.  3.  Medication compliance:  compliant all of the time.   # HYPOTHYROIDISM:  She has a family history of hypothyroidism, Graves' disease and thyroid  nodules. On her visit in 11/20 she was complaining of feeling more tired as well as colder; had some weight gain also. Baseline TSH was 6. With levothyroxine  25 mcg daily she did not feel any better and with TSH still 4.5 she was told to go up to 50 mcg in 07/2019.  OSTEOPENIA:  No history of fractures  Her last bone density was in 4/22  Results:   Lumbar spine L1-L4(L3) Femoral neck (FN)  T-score 0.2 RFN: -1.0 LFN: -1.3  Change in BMD from previous DXA test (%) Down 0.6% Down 2.0%    She was tried on Evista  but she says it was causing her hot flashes and night sweats and she stopped taking this.  She is taking 1000 units of vitamin D  with adequate vitamin D  levels.   Previously in 07/2015 T score -2.3 was at the wrist, this was not measured in 2019.   # HYPERCALCEMIA: She has had periodic mild hypercalcemia since about 2010; PTH normal. Calcium  is generally upper normal and stable  Interval history  Hemoglobin A1c today 8.1% improved.  Diabetes regimen as reviewed and noted above.  She reports she has not been exercising lately.  Lab results from October reviewed.  Normal thyroid  function test.  She has been taking  levothyroxine  50 mcg daily.  No hypo and hyperthyroid symptoms.  Serum calcium  normal.  Normal creatinine.  Lipids level acceptable with LDL 79.  Mild elevation of urine microalbumin creatinine ratio.  No other complaints today.  REVIEW OF SYSTEMS As per history of present illness.   PAST MEDICAL HISTORY: Past Medical History:  Diagnosis Date   CAD (coronary artery disease)    Colitis    Colitis, indeterminate 01/31/2012   COPD (chronic obstructive pulmonary disease) (HCC)    Depression 05/16/2017   Depression with anxiety 05/30/2016   Diverticulitis 02/10/2012   DM (diabetes mellitus) (HCC)    Glaucoma    Glaucoma    History of ventricular  fibrillation    HTN (hypertension)    Hyperlipidemia    Myocardial infarction (HCC)    12/1998, 01/2011   Osteoporosis    PVD (peripheral vascular disease)     PAST SURGICAL HISTORY: Past Surgical History:  Procedure Laterality Date   ABDOMINAL HYSTERECTOMY     endometriosis   CABG x 3  01/22/2011   Dr Lucas   CARDIAC CATHETERIZATION  01/17/2011   Dr Patrisha   LIPOMA EXCISION Left 02/08/2015   Procedure: EXCISION OF LEFT THIGH LIPOMA;  Surgeon: Camellia Blush, MD;  Location: [facility];  Service: General;  Laterality: Left;   S/P aortobifemoral bypass  2008   Dr Oris   S/P myocardial infarction and PCI of the lt circumflex  2000    ALLERGIES: Allergies  Allergen Reactions   Metformin  And Related Nausea And Vomiting    After one pill at hospital   Januvia  [Sitagliptin  Phosphate] Other (See Comments)    Swelling and edema see 4 13 OV    FAMILY HISTORY:  Family History  Problem Relation Age of Onset   Diabetes Mother    Liver cancer Mother    Heart attack Father    Diabetes Father    Heart disease Father    Breast cancer Sister    Diabetes Sister    Thyroid  disease Sister    Diabetes Brother    Colon polyps Maternal Uncle    Hypertension Other    Thyroid  disease Other        sisters- sister had parathyroidectomy   Alcohol abuse Other        brother    SOCIAL HISTORY: Social History   Socioeconomic History   Marital status: Married    Spouse name: Not on file   Number of children: 3   Years of education: Not on file   Highest education level: Not on file  Occupational History   Occupation: housewife    Employer: UNEMPLOYED  Tobacco Use   Smoking status: Every Day    Current packs/day: 1.00    Types: Cigarettes   Smokeless tobacco: Never  Vaping Use   Vaping status: Never Used  Substance and Sexual Activity   Alcohol use: No   Drug use: No   Sexual activity: Not on file  Other Topics Concern   Not on file  Social History Narrative   t  widowed    Regular exercise- yes not recently       Quit tobacco in August at heart surgery. Restarted    Husband diagnosed with liver cancer since 2014 passed April 2015    She was care taker.   No pets   Social Drivers of Corporate Investment Banker Strain: Low Risk  (03/20/2020)   Overall Financial Resource Strain (CARDIA)    Difficulty of  Paying Living Expenses: Not hard at all  Recent Concern: Financial Resource Strain - Medium Risk (01/30/2020)   Overall Financial Resource Strain (CARDIA)    Difficulty of Paying Living Expenses: Somewhat hard  Food Insecurity: No Food Insecurity (03/10/2023)   Hunger Vital Sign    Worried About Running Out of Food in the Last Year: Never true    Ran Out of Food in the Last Year: Never true  Transportation Needs: No Transportation Needs (03/20/2020)   PRAPARE - Administrator, Civil Service (Medical): No    Lack of Transportation (Non-Medical): No  Physical Activity: Insufficiently Active (03/10/2023)   Exercise Vital Sign    Days of Exercise per Week: 3 days    Minutes of Exercise per Session: 10 min  Stress: No Stress Concern Present (03/10/2023)   [employer] of Occupational Health - Occupational Stress Questionnaire    Feeling of Stress : Not at all  Social Connections: Socially Isolated (03/10/2023)   Social Connection and Isolation Panel    Frequency of Communication with Friends and Family: More than three times a week    Frequency of Social Gatherings with Friends and Family: Twice a week    Attends Religious Services: Never    Database Administrator or Organizations: No    Attends Banker Meetings: Never    Marital Status: Widowed    MEDICATIONS:  Current Outpatient Medications  Medication Sig Dispense Refill   Accu-Chek FastClix Lancets MISC Use as instructed to check blood sugar 4 times daily. 150 each 2   ACCU-CHEK GUIDE test strip USE ACCU CHECK GUIDE TEST STRIPS AS INSTRUCTED TO CHECK BLOOD SUGAR  4 TIMES DAILY. 100 strip 4   albuterol  (VENTOLIN  HFA) 108 (90 Base) MCG/ACT inhaler INHALE 2 PUFFS INTO THE LUNGS EVERY 6 HOURS AS NEEDED FOR SHORTNESS OF BREATH OR WHEEZING 6.7 each 2   amLODipine  (NORVASC ) 10 MG tablet Take 1 tablet (10 mg total) by mouth daily. 90 tablet 3   atorvastatin  (LIPITOR ) 80 MG tablet Take 1 tablet (80 mg total) by mouth daily. 90 tablet 2   Blood Glucose Monitoring Suppl (ACCU-CHEK GUIDE) w/Device KIT 1 each by Does not apply route 4 (four) times daily. Use Accu Chek to check blood sugar 4 times daily. 1 kit 0   ezetimibe  (ZETIA ) 10 MG tablet TAKE 1 TABLET BY MOUTH EVERY DAY 90 tablet 2   fenofibrate  (TRICOR ) 145 MG tablet TAKE 1 TABLET BY MOUTH DAILY**STOP THE ZETIA  90 tablet 3   glucose blood (ACCU-CHEK GUIDE TEST) test strip Use to test blood glucose before meals and at bedtime 300 each 12   insulin  aspart (NOVOLOG  FLEXPEN) 100 UNIT/ML FlexPen INJECT 18 UNITS UNDER THE SKIN AT LARGEST MEAL AND 12 UNITS AT DINNER 30 mL 2   Insulin  Pen Needle (BD PEN NEEDLE NANO U/F) 32G X 4 MM MISC Use as instructed to inject 3 insulin  3 times daily. 100 each 3   irbesartan  (AVAPRO ) 300 MG tablet TAKE 1 TABLET BY MOUTH EVERY DAY 90 tablet 1   levothyroxine  (SYNTHROID ) 50 MCG tablet Take 1 tablet (50 mcg total) by mouth daily. 90 tablet 3   empagliflozin  (JARDIANCE ) 25 MG TABS tablet Take 1 tablet (25 mg total) by mouth daily before breakfast. 90 tablet 3   insulin  degludec (TRESIBA  FLEXTOUCH) 200 UNIT/ML FlexTouch Pen Inject 44 Units into the skin daily. 9 mL 2   No current facility-administered medications for this visit.    PHYSICAL EXAM: Vitals:  05/16/24 0815 05/16/24 0816  BP: (!) 158/70 (!) 154/72  Pulse: 81   Resp: 16   SpO2: 96%   Weight: 166 lb 9.6 oz (75.6 kg)   Height: 5' 4 (1.626 m)     Body mass index is 28.6 kg/m.  Wt Readings from Last 3 Encounters:  05/16/24 166 lb 9.6 oz (75.6 kg)  04/07/24 167 lb 9.6 oz (76 kg)  01/13/24 164 lb 9.6 oz (74.7 kg)     General: Well developed, well nourished female in no apparent distress.  HEENT: AT/[place], no external lesions.  Eyes: Conjunctiva clear and no icterus. Neck: Neck supple  Lungs: Respirations not labored Neurologic: Alert, oriented, normal speech Extremities / Skin: Dry. Psychiatric: Does not appear depressed or anxious  Diabetic Foot Exam - Simple   Simple Foot Form Diabetic Foot exam was performed with the following findings: Yes 05/16/2024  8:37 AM  Visual Inspection No deformities, no ulcerations, no other skin breakdown bilaterally: Yes Sensation Testing Intact to touch and monofilament testing bilaterally: Yes Pulse Check Posterior Tibialis and Dorsalis pulse intact bilaterally: Yes Comments    LABS Reviewed Lab Results  Component Value Date   HGBA1C 8.1 (A) 05/16/2024   HGBA1C 8.9 (A) 01/13/2024   HGBA1C 7.3 (H) 08/31/2023   Lab Results  Component Value Date   FRUCTOSAMINE 262 02/03/2019   Lab Results  Component Value Date   CHOL 173 03/31/2024   HDL 24.10 (L) 03/31/2024   LDLCALC 79 03/31/2024   LDLDIRECT 77.0 08/13/2021   TRIG 347.0 (H) 03/31/2024   CHOLHDL 7 03/31/2024   Lab Results  Component Value Date   MICRALBCREAT 46.7 (H) 03/31/2024   MICRALBCREAT NOTE 01/13/2024   Lab Results  Component Value Date   CREATININE 0.86 03/31/2024   Lab Results  Component Value Date   GFR 67.56 03/31/2024    ASSESSMENT / PLAN  1. Uncontrolled type 2 diabetes mellitus with hyperglycemia, with long-term current use of insulin  (HCC)   2. Acquired autoimmune hypothyroidism   3. Osteopenia of neck of right femur   4. Vitamin D  deficiency     Diabetes Mellitus type 2, complicated by microalbuminuria. - Diabetic status / severity: Uncontrolled.  Worsening.  Lab Results  Component Value Date   HGBA1C 8.1 (A) 05/16/2024    - Hemoglobin A1c goal : <6.5%  Mild improvement on diabetes control.  No data to review.  Encourage patient to limit portion control  and increase physical activities as tolerated.  - Medications: See below. Increase Tresiba  U200 from 42 units to 44 units at bedtime.   Continue NovoLog  18 units before lunch and 12 units with supper. Continue Jardiance  25 mg daily.    Discussed about decreasing the dose of NovoLog  for lunch time if she is having increased physical activity or eating less food for the lungs.  - Home glucose testing: Before meals and at bedtime. - Discussed/ Gave Hypoglycemia treatment plan.  # Consult : not required at this time.   # Annual urine for microalbuminuria/ creatinine ratio, + microalbuminuria currently, continue ACE/ARB /irbesartan , Jardiance .  Last  Lab Results  Component Value Date   MICRALBCREAT 46.7 (H) 03/31/2024    # Foot check nightly.  # Annual dilated diabetic eye exams.   - Diet: Make healthy diabetic food choices - Life style / activity / exercise: Discussed.  2. Blood pressure  -  BP Readings from Last 1 Encounters:  05/16/24 (!) 154/72    - Control is not in target. Advised to  monitor at home and if remains elevated asked to discuss with primary care provider/cardiology. - No change in current plans.   3. Lipid status / Hyperlipidemia - Last  Lab Results  Component Value Date   LDLCALC 79 03/31/2024   - Continue atorvastatin  80 mg daily.  Continue fenofibrate  /Zetia .  Managed by primary care provider.  # Primary hypothyroidism -TSH normal in October.  Will continue current dose of levothyroxine  50 mcg daily.  Annual thyroid  lab.  # Osteopenia -Last DEXA scan in April 2022. -Patient vitamin D  is in normal range.  Current vitamin D  and calcium  supplement. -Consider repeat DEXA scan in 1 to 2 years.  Diagnoses and all orders for this visit:  Uncontrolled type 2 diabetes mellitus with hyperglycemia, with long-term current use of insulin  (HCC) -     POCT glycosylated hemoglobin (Hb A1C) -     insulin  degludec (TRESIBA  FLEXTOUCH) 200 UNIT/ML FlexTouch Pen;  Inject 44 Units into the skin daily. -     empagliflozin  (JARDIANCE ) 25 MG TABS tablet; Take 1 tablet (25 mg total) by mouth daily before breakfast.  Acquired autoimmune hypothyroidism  Osteopenia of neck of right femur  Vitamin D  deficiency    DISPOSITION Follow up in clinic in 4 months suggested.    All questions answered and patient verbalized understanding of the plan.  Tahmid Stonehocker, MD [facility] Endocrinology [facility] [address] [place], [address] [place], [place] [address] Phone # [phone]  At least part of this note was generated using voice recognition software. Inadvertent word errors may have occurred, which were not recognized during the proofreading process.

## 2024-05-16 NOTE — Patient Instructions (Signed)
 Tresiba  U200 44 units at bedtime. NovoLog  18 units before lunch and 12 units with supper.  Jardiance  25 mg daily.

## 2024-05-21 ENCOUNTER — Other Ambulatory Visit: Payer: Self-pay | Admitting: Cardiology

## 2024-06-02 NOTE — Progress Notes (Unsigned)
 Cardiology Office Note:    Date:  06/02/2024   ID:  Kimberly [name] Core, DOB 02-Jul-1950, MRN [id number]  PCP:  Charlett Apolinar POUR, MD  Cardiologist:  None  Electrophysiologist:  None   Referring MD: Charlett Apolinar POUR, MD   No chief complaint on file.   History of Present Illness:    Kimberly [name] is a 73 y.o. female with a hx of CAD, COPD, T2DM, PAD status post aortobifemoral bypass in 2008 who is referred by Dr. Charlett for evaluation of orthostatic hypotension and CAD.  She previously followed with Dr. Delford in cardiology, last seen in 2017.  She had a history of MI and V-fib arrest with PCI to LCx in 2000.  She presented with chest pain in 2012 and found to have severe multivessel disease, underwent CABG x 3 (LIMA-LAD, SVG-OM, SVG-RCA).  Echocardiogram 04/02/2023 showed EF 65 to 70%, grade 1 diastolic dysfunction, mild RV dysfunction, no significant valvular disease.  Since last clinic visit,  she reports he is doing well.  Denies any chest pain, dyspnea, lightheadedness, syncope, or palpitations.  Mild swelling in left leg.  Still smoking 1ppd.     Past Medical History:  Diagnosis Date   CAD (coronary artery disease)    Colitis    Colitis, indeterminate 01/31/2012   COPD (chronic obstructive pulmonary disease) (HCC)    Depression 05/16/2017   Depression with anxiety 05/30/2016   Diverticulitis 02/10/2012   DM (diabetes mellitus) (HCC)    Glaucoma    Glaucoma    History of ventricular fibrillation    HTN (hypertension)    Hyperlipidemia    Myocardial infarction (HCC)    12/1998, 01/2011   Osteoporosis    PVD (peripheral vascular disease)     Past Surgical History:  Procedure Laterality Date   ABDOMINAL HYSTERECTOMY     endometriosis   CABG x 3  01/22/2011   Dr Lucas   CARDIAC CATHETERIZATION  01/17/2011   Dr Patrisha   LIPOMA EXCISION Left 02/08/2015   Procedure: EXCISION OF LEFT THIGH LIPOMA;  Surgeon: Camellia Blush, MD;  Location: [facility];  Service: General;   Laterality: Left;   S/P aortobifemoral bypass  2008   Dr Oris   S/P myocardial infarction and PCI of the lt circumflex  2000    Current Medications: No outpatient medications have been marked as taking for the 06/03/24 encounter (Appointment) with Kate Lonni CROME, MD.     Allergies:   Metformin  and related and Januvia  [sitagliptin  phosphate]   Social History   Socioeconomic History   Marital status: Married    Spouse name: Not on file   Number of children: 3   Years of education: Not on file   Highest education level: Not on file  Occupational History   Occupation: housewife    Employer: UNEMPLOYED  Tobacco Use   Smoking status: Every Day    Current packs/day: 1.00    Types: Cigarettes   Smokeless tobacco: Never  Vaping Use   Vaping status: Never Used  Substance and Sexual Activity   Alcohol use: No   Drug use: No   Sexual activity: Not on file  Other Topics Concern   Not on file  Social History Narrative   t widowed    Regular exercise- yes not recently       Quit tobacco in August at heart surgery. Restarted    Husband diagnosed with liver cancer since 2014 passed April 2015    She was care taker.  No pets   Social Drivers of Health   Tobacco Use: High Risk (05/16/2024)   Patient History    Smoking Tobacco Use: Every Day    Smokeless Tobacco Use: Never    Passive Exposure: Not on file  Financial Resource Strain: Not on file  Food Insecurity: No Food Insecurity (03/10/2023)   Hunger Vital Sign    Worried About Running Out of Food in the Last Year: Never true    Ran Out of Food in the Last Year: Never true  Transportation Needs: Not on file  Physical Activity: Insufficiently Active (03/10/2023)   Exercise Vital Sign    Days of Exercise per Week: 3 days    Minutes of Exercise per Session: 10 min  Stress: No Stress Concern Present (03/10/2023)   [employer] of Occupational Health - Occupational Stress Questionnaire    Feeling of Stress : Not  at all  Social Connections: Socially Isolated (03/10/2023)   Social Connection and Isolation Panel    Frequency of Communication with Friends and Family: More than three times a week    Frequency of Social Gatherings with Friends and Family: Twice a week    Attends Religious Services: Never    Database Administrator or Organizations: No    Attends Banker Meetings: Never    Marital Status: Widowed  Depression (PHQ2-9): Low Risk (04/07/2024)   Depression (PHQ2-9)    PHQ-2 Score: 0  Alcohol Screen: Low Risk (03/10/2023)   Alcohol Screen    Last Alcohol Screening Score (AUDIT): 0  Housing: Low Risk (03/10/2023)   Housing    Last Housing Risk Score: 0  Utilities: Not At Risk (03/10/2023)   AHC Utilities    Threatened with loss of utilities: No  Health Literacy: Adequate Health Literacy (03/10/2023)   B1300 Health Literacy    Frequency of need for help with medical instructions: Never     Family History: The patient's family history includes Alcohol abuse in an other family member; Breast cancer in her sister; Colon polyps in her maternal uncle; Diabetes in her brother, father, mother, and sister; Heart attack in her father; Heart disease in her father; Hypertension in an other family member; Liver cancer in her mother; Thyroid  disease in her sister and another family member.  ROS:   Please see the history of present illness.     All other systems reviewed and are negative.  EKGs/Labs/Other Studies Reviewed:    The following studies were reviewed today:   EKG:   06/02/23: NSR, nonspecific T wave flattening, rate 87 12/07/2023: Normal sinus rhythm, rate 76, no ST abnormalities  Recent Labs: 03/31/2024: ALT 19; BUN 20; Creatinine, Ser 0.86; Hemoglobin 15.2; Platelets 155.0; Potassium 4.7; Sodium 138; TSH 3.75  Recent Lipid Panel    Component Value Date/Time   CHOL 173 03/31/2024 0759   CHOL 180 12/07/2023 1150   TRIG 347.0 (H) 03/31/2024 0759   HDL 24.10 (L)  03/31/2024 9240   HDL 26 (L) 12/07/2023 1150   CHOLHDL 7 03/31/2024 0759   VLDL 69.4 (H) 03/31/2024 0759   LDLCALC 79 03/31/2024 0759   LDLCALC 101 (H) 12/07/2023 1150   LDLDIRECT 77.0 08/13/2021 0814    Physical Exam:    VS:  There were no vitals taken for this visit.    Wt Readings from Last 3 Encounters:  05/16/24 166 lb 9.6 oz (75.6 kg)  04/07/24 167 lb 9.6 oz (76 kg)  01/13/24 164 lb 9.6 oz (74.7 kg)  GEN:  Well nourished, well developed in no acute distress HEENT: Normal NECK: No JVD; No carotid bruits LYMPHATICS: No lymphadenopathy CARDIAC: RRR, no murmurs, rubs, gallops RESPIRATORY:  Clear to auscultation without rales, wheezing or rhonchi  ABDOMEN: Soft, non-tender, non-distended MUSCULOSKELETAL:  No edema; No deformity  SKIN: Warm and dry NEUROLOGIC:  Alert and oriented x 3 PSYCHIATRIC:  Normal affect   ASSESSMENT:    No diagnosis found.   PLAN:    CAD: history of MI and V-fib arrest with PCI to LCx in 2000.  She presented with chest pain in 2012 and found to have severe multivessel disease, underwent CABG x 3 (LIMA-LAD, SVG-OM, SVG-RCA).  Echocardiogram 04/02/2023 showed EF 65 to 70%, grade 1 diastolic dysfunction, mild RV dysfunction, no significant valvular disease. -Continue aspirin  81 mg daily -Continue atorvastatin  80 mg daily and Zetia  10 mg daily   Hypertension: On irbesartan  300 mg daily and amlodipine  10 mg daily.   Hyperlipidemia: On atorvastatin  80 mg daily, Zetia  10 mg daily, fenofibrate  145 mg daily.  LDL 101 on 12/07/2023.  Recommend referral to pharmacy lipid clinic***  T2DM: On insulin .  Follows with endocrinology.  Hemoglobin A1c 7.3 on 08/31/23  Tobacco use: Counseled on the risk of tobacco use and cessation strongly encouraged.  She states he is not interested in quitting at this time***  RTC in 6 months***   Medication Adjustments/Labs and Tests Ordered: Current medicines are reviewed at length with the patient today.  Concerns  regarding medicines are outlined above.  No orders of the defined types were placed in this encounter.  No orders of the defined types were placed in this encounter.   There are no Patient Instructions on file for this visit.   Signed, Lonni LITTIE Nanas, MD  06/02/2024 4:11 PM    [facility]

## 2024-06-03 ENCOUNTER — Encounter: Payer: Self-pay | Admitting: Cardiology

## 2024-06-03 ENCOUNTER — Ambulatory Visit: Attending: Cardiology | Admitting: Cardiology

## 2024-06-03 VITALS — BP 148/70 | HR 86 | Ht 64.0 in | Wt 165.0 lb

## 2024-06-03 DIAGNOSIS — I2510 Atherosclerotic heart disease of native coronary artery without angina pectoris: Secondary | ICD-10-CM | POA: Diagnosis not present

## 2024-06-03 DIAGNOSIS — E785 Hyperlipidemia, unspecified: Secondary | ICD-10-CM | POA: Diagnosis not present

## 2024-06-03 DIAGNOSIS — Z720 Tobacco use: Secondary | ICD-10-CM | POA: Diagnosis not present

## 2024-06-03 DIAGNOSIS — I10 Essential (primary) hypertension: Secondary | ICD-10-CM | POA: Diagnosis not present

## 2024-06-03 DIAGNOSIS — E1169 Type 2 diabetes mellitus with other specified complication: Secondary | ICD-10-CM

## 2024-06-03 MED ORDER — ASPIRIN 81 MG PO TBEC: 81.0000 mg | DELAYED_RELEASE_TABLET | Freq: Every day | ORAL | 3 refills | Status: AC

## 2024-06-03 NOTE — Patient Instructions (Signed)
 Medication Instructions:  RESTART ASPIRIN  81 MG DAILY *If you need a refill on your cardiac medications before your next appointment, please call your pharmacy*  Lab Work: NO LABS If you have labs (blood work) drawn today and your tests are completely normal, you will receive your results only by: MyChart Message (if you have MyChart) OR A paper copy in the mail If you have any lab test that is abnormal or we need to change your treatment, we will call you to review the results.  Testing/Procedures: NO TESTING  Follow-Up: At [facility], you and your health needs are our priority.  As part of our continuing mission to provide you with exceptional heart care, our providers are all part of one team.  This team includes your primary Cardiologist (physician) and Advanced Practice Providers or APPs (Physician Assistants and Nurse Practitioners) who all work together to provide you with the care you need, when you need it.  Your next appointment:   6 month(s)  Provider:   Lonni Nanas, MD  We recommend signing up for the patient portal called MyChart.  Sign up information is provided on this After Visit Summary.  MyChart is used to connect with patients for Virtual Visits (Telemedicine).  Patients are able to view lab/test results, encounter notes, upcoming appointments, etc.  Non-urgent messages can be sent to your provider as well.   To learn more about what you can do with MyChart, go to [email or web address].   Other Instructions You have been referred to Pharm D- Lipid Clinic  CHECK BLOOD PRESSURE DAILY FOR 1 WEEK, KEEP LOG OF READINGS AND SEND THEM VIA MYCHART

## 2024-06-29 ENCOUNTER — Other Ambulatory Visit: Payer: Self-pay | Admitting: Endocrinology

## 2024-06-29 DIAGNOSIS — I10 Essential (primary) hypertension: Secondary | ICD-10-CM

## 2024-07-09 ENCOUNTER — Other Ambulatory Visit: Payer: Self-pay | Admitting: Endocrinology

## 2024-07-09 DIAGNOSIS — E063 Autoimmune thyroiditis: Secondary | ICD-10-CM

## 2024-09-14 ENCOUNTER — Ambulatory Visit: Admitting: Endocrinology

## 2024-09-21 ENCOUNTER — Ambulatory Visit: Admitting: Endocrinology

## 2024-10-06 ENCOUNTER — Ambulatory Visit: Admitting: Internal Medicine
